# Patient Record
Sex: Male | Born: 1935 | Race: White | Hispanic: No | Smoking: Former smoker
Health system: Southern US, Community
[De-identification: ages and names within clinical notes are randomized; demographics above are authoritative.]

## PROBLEM LIST (undated history)

## (undated) DIAGNOSIS — I10 Essential (primary) hypertension: Secondary | ICD-10-CM

## (undated) DIAGNOSIS — L570 Actinic keratosis: Secondary | ICD-10-CM

## (undated) DIAGNOSIS — G2581 Restless legs syndrome: Secondary | ICD-10-CM

## (undated) DIAGNOSIS — N400 Enlarged prostate without lower urinary tract symptoms: Secondary | ICD-10-CM

## (undated) DIAGNOSIS — R011 Cardiac murmur, unspecified: Secondary | ICD-10-CM

## (undated) DIAGNOSIS — R296 Repeated falls: Secondary | ICD-10-CM

## (undated) DIAGNOSIS — I890 Lymphedema, not elsewhere classified: Secondary | ICD-10-CM

## (undated) DIAGNOSIS — M48061 Spinal stenosis, lumbar region without neurogenic claudication: Secondary | ICD-10-CM

## (undated) DIAGNOSIS — K219 Gastro-esophageal reflux disease without esophagitis: Secondary | ICD-10-CM

## (undated) DIAGNOSIS — I4821 Permanent atrial fibrillation: Secondary | ICD-10-CM

## (undated) DIAGNOSIS — K130 Diseases of lips: Secondary | ICD-10-CM

## (undated) DIAGNOSIS — I517 Cardiomegaly: Secondary | ICD-10-CM

## (undated) DIAGNOSIS — G479 Sleep disorder, unspecified: Secondary | ICD-10-CM

## (undated) DIAGNOSIS — M179 Osteoarthritis of knee, unspecified: Secondary | ICD-10-CM

## (undated) DIAGNOSIS — N184 Chronic kidney disease, stage 4 (severe): Secondary | ICD-10-CM

## (undated) DIAGNOSIS — N289 Disorder of kidney and ureter, unspecified: Secondary | ICD-10-CM

## (undated) DIAGNOSIS — R202 Paresthesia of skin: Secondary | ICD-10-CM

## (undated) DIAGNOSIS — H9190 Unspecified hearing loss, unspecified ear: Secondary | ICD-10-CM

## (undated) DIAGNOSIS — E785 Hyperlipidemia, unspecified: Secondary | ICD-10-CM

## (undated) DIAGNOSIS — N529 Male erectile dysfunction, unspecified: Secondary | ICD-10-CM

## (undated) DIAGNOSIS — M1710 Unilateral primary osteoarthritis, unspecified knee: Secondary | ICD-10-CM

## (undated) DIAGNOSIS — R200 Anesthesia of skin: Secondary | ICD-10-CM

## (undated) DIAGNOSIS — K5730 Diverticulosis of large intestine without perforation or abscess without bleeding: Secondary | ICD-10-CM

## (undated) HISTORY — DX: Cardiac murmur, unspecified: R01.1

## (undated) HISTORY — DX: Spinal stenosis, lumbar region without neurogenic claudication: M48.061

## (undated) HISTORY — DX: Actinic keratosis: L57.0

## (undated) HISTORY — DX: Male erectile dysfunction, unspecified: N52.9

## (undated) HISTORY — DX: Restless legs syndrome: G25.81

## (undated) HISTORY — DX: Diverticulosis of large intestine without perforation or abscess without bleeding: K57.30

## (undated) HISTORY — DX: Unilateral primary osteoarthritis, unspecified knee: M17.10

## (undated) HISTORY — DX: Osteoarthritis of knee, unspecified: M17.9

## (undated) HISTORY — DX: Diseases of lips: K13.0

## (undated) HISTORY — DX: Gastro-esophageal reflux disease without esophagitis: K21.9

## (undated) HISTORY — DX: Sleep disorder, unspecified: G47.9

## (undated) HISTORY — DX: Benign prostatic hyperplasia without lower urinary tract symptoms: N40.0

## (undated) HISTORY — DX: Disorder of kidney and ureter, unspecified: N28.9

## (undated) HISTORY — PX: TONSILLECTOMY: SUR1361

## (undated) HISTORY — DX: Essential (primary) hypertension: I10

## (undated) HISTORY — DX: Hyperlipidemia, unspecified: E78.5

## (undated) HISTORY — PX: FINGER SURGERY: SHX640

---

## 2003-06-29 DIAGNOSIS — K5730 Diverticulosis of large intestine without perforation or abscess without bleeding: Secondary | ICD-10-CM

## 2003-06-29 HISTORY — DX: Diverticulosis of large intestine without perforation or abscess without bleeding: K57.30

## 2004-01-23 ENCOUNTER — Encounter: Payer: Self-pay | Admitting: Family Medicine

## 2004-04-06 ENCOUNTER — Ambulatory Visit: Payer: Self-pay | Admitting: Internal Medicine

## 2004-05-10 ENCOUNTER — Ambulatory Visit: Payer: Self-pay | Admitting: Internal Medicine

## 2004-06-15 ENCOUNTER — Ambulatory Visit: Payer: Self-pay | Admitting: Internal Medicine

## 2004-07-23 ENCOUNTER — Ambulatory Visit: Payer: Self-pay | Admitting: Internal Medicine

## 2004-10-11 ENCOUNTER — Ambulatory Visit: Payer: Self-pay | Admitting: Internal Medicine

## 2004-11-03 ENCOUNTER — Ambulatory Visit: Payer: Self-pay | Admitting: Internal Medicine

## 2005-01-24 ENCOUNTER — Ambulatory Visit: Payer: Self-pay | Admitting: Internal Medicine

## 2005-03-25 ENCOUNTER — Encounter: Admission: RE | Admit: 2005-03-25 | Discharge: 2005-03-25 | Payer: Self-pay | Admitting: Orthopaedic Surgery

## 2005-04-13 ENCOUNTER — Encounter: Admission: RE | Admit: 2005-04-13 | Discharge: 2005-04-13 | Payer: Self-pay | Admitting: Orthopaedic Surgery

## 2005-04-29 ENCOUNTER — Encounter: Admission: RE | Admit: 2005-04-29 | Discharge: 2005-04-29 | Payer: Self-pay | Admitting: Orthopaedic Surgery

## 2005-05-05 ENCOUNTER — Ambulatory Visit: Payer: Self-pay | Admitting: Internal Medicine

## 2005-05-12 ENCOUNTER — Ambulatory Visit: Payer: Self-pay | Admitting: Internal Medicine

## 2005-06-15 ENCOUNTER — Ambulatory Visit: Payer: Self-pay | Admitting: Internal Medicine

## 2005-06-28 HISTORY — PX: LUMBAR SPINE SURGERY: SHX701

## 2005-08-17 ENCOUNTER — Ambulatory Visit: Payer: Self-pay | Admitting: Internal Medicine

## 2005-10-03 ENCOUNTER — Ambulatory Visit: Payer: Self-pay | Admitting: Internal Medicine

## 2005-11-02 ENCOUNTER — Ambulatory Visit: Payer: Self-pay | Admitting: Internal Medicine

## 2005-12-15 ENCOUNTER — Ambulatory Visit: Payer: Self-pay | Admitting: Internal Medicine

## 2006-01-27 ENCOUNTER — Ambulatory Visit: Payer: Self-pay | Admitting: Internal Medicine

## 2006-03-16 ENCOUNTER — Encounter: Payer: Self-pay | Admitting: Internal Medicine

## 2006-05-29 ENCOUNTER — Ambulatory Visit: Payer: Self-pay | Admitting: Internal Medicine

## 2006-05-29 LAB — CONVERTED CEMR LAB
ALT: 22 units/L (ref 0–40)
Albumin: 3.9 g/dL (ref 3.5–5.2)
BUN: 26 mg/dL — ABNORMAL HIGH (ref 6–23)
CO2: 32 meq/L (ref 19–32)
Calcium: 9.8 mg/dL (ref 8.4–10.5)
Chloride: 104 meq/L (ref 96–112)
Cholesterol: 162 mg/dL (ref 0–200)
Creatinine, Ser: 1.3 mg/dL (ref 0.4–1.5)
GFR calc Af Amer: 68 mL/min
GFR calc non Af Amer: 56 mL/min
Glucose, Bld: 101 mg/dL — ABNORMAL HIGH (ref 70–99)
HDL: 48.7 mg/dL (ref >39.0)
LDL Cholesterol: 97 mg/dL (ref 0–99)
Phosphorus: 3.3 mg/dL (ref 2.3–4.6)
Potassium: 4.4 meq/L (ref 3.5–5.1)
Sodium: 141 meq/L (ref 135–145)
Total CHOL/HDL Ratio: 3.3
Triglycerides: 84 mg/dL (ref 0–149)
VLDL: 17 mg/dL (ref 0–40)

## 2006-06-21 ENCOUNTER — Encounter: Payer: Self-pay | Admitting: Internal Medicine

## 2006-06-21 DIAGNOSIS — K5730 Diverticulosis of large intestine without perforation or abscess without bleeding: Secondary | ICD-10-CM | POA: Insufficient documentation

## 2006-06-21 DIAGNOSIS — K219 Gastro-esophageal reflux disease without esophagitis: Secondary | ICD-10-CM | POA: Insufficient documentation

## 2006-06-21 DIAGNOSIS — I10 Essential (primary) hypertension: Secondary | ICD-10-CM | POA: Insufficient documentation

## 2006-06-21 DIAGNOSIS — N401 Enlarged prostate with lower urinary tract symptoms: Secondary | ICD-10-CM | POA: Insufficient documentation

## 2006-06-21 DIAGNOSIS — E785 Hyperlipidemia, unspecified: Secondary | ICD-10-CM | POA: Insufficient documentation

## 2006-06-21 DIAGNOSIS — F528 Other sexual dysfunction not due to a substance or known physiological condition: Secondary | ICD-10-CM | POA: Insufficient documentation

## 2006-06-21 DIAGNOSIS — N138 Other obstructive and reflux uropathy: Secondary | ICD-10-CM | POA: Insufficient documentation

## 2006-06-24 DIAGNOSIS — M48061 Spinal stenosis, lumbar region without neurogenic claudication: Secondary | ICD-10-CM | POA: Insufficient documentation

## 2006-07-17 ENCOUNTER — Telehealth (INDEPENDENT_AMBULATORY_CARE_PROVIDER_SITE_OTHER): Payer: Self-pay | Admitting: *Deleted

## 2006-09-20 ENCOUNTER — Encounter (INDEPENDENT_AMBULATORY_CARE_PROVIDER_SITE_OTHER): Payer: Self-pay | Admitting: *Deleted

## 2006-10-03 ENCOUNTER — Telehealth: Payer: Self-pay | Admitting: Family Medicine

## 2006-10-23 ENCOUNTER — Telehealth (INDEPENDENT_AMBULATORY_CARE_PROVIDER_SITE_OTHER): Payer: Self-pay | Admitting: *Deleted

## 2006-12-12 ENCOUNTER — Ambulatory Visit: Payer: Self-pay | Admitting: Internal Medicine

## 2006-12-12 DIAGNOSIS — M1990 Unspecified osteoarthritis, unspecified site: Secondary | ICD-10-CM | POA: Insufficient documentation

## 2006-12-13 LAB — CONVERTED CEMR LAB
ALT: 27 units/L (ref 0–53)
AST: 35 units/L (ref 0–37)
Albumin: 4.2 g/dL (ref 3.5–5.2)
Alkaline Phosphatase: 64 units/L (ref 39–117)
BUN: 28 mg/dL — ABNORMAL HIGH (ref 6–23)
Basophils Absolute: 0 10*3/uL (ref 0.0–0.1)
Basophils Relative: 0.2 % (ref 0.0–1.0)
Bilirubin, Direct: 0.1 mg/dL (ref 0.0–0.3)
CO2: 31 meq/L (ref 19–32)
Calcium: 10 mg/dL (ref 8.4–10.5)
Chloride: 103 meq/L (ref 96–112)
Cholesterol: 172 mg/dL (ref 0–200)
Creatinine, Ser: 1.3 mg/dL (ref 0.4–1.5)
Eosinophils Absolute: 0.1 10*3/uL (ref 0.0–0.6)
Eosinophils Relative: 2 % (ref 0.0–5.0)
GFR calc Af Amer: 68 mL/min
GFR calc non Af Amer: 56 mL/min
Glucose, Bld: 95 mg/dL (ref 70–99)
HCT: 43 % (ref 39.0–52.0)
HDL: 36.5 mg/dL — ABNORMAL LOW (ref >39.0)
Hemoglobin: 14.6 g/dL (ref 13.0–17.0)
LDL Cholesterol: 113 mg/dL — ABNORMAL HIGH (ref 0–99)
Lymphocytes Relative: 25.8 % (ref 12.0–46.0)
MCHC: 33.9 g/dL (ref 30.0–36.0)
MCV: 93 fL (ref 78.0–100.0)
Monocytes Absolute: 0.4 10*3/uL (ref 0.2–0.7)
Monocytes Relative: 6.9 % (ref 3.0–11.0)
Neutro Abs: 4 10*3/uL (ref 1.4–7.7)
Neutrophils Relative %: 65.1 % (ref 43.0–77.0)
Phosphorus: 3.1 mg/dL (ref 2.3–4.6)
Platelets: 172 10*3/uL (ref 150–400)
Potassium: 5.3 meq/L — ABNORMAL HIGH (ref 3.5–5.1)
RBC: 4.62 M/uL (ref 4.22–5.81)
RDW: 12.3 % (ref 11.5–14.6)
Sodium: 140 meq/L (ref 135–145)
TSH: 1.69 microintl units/mL (ref 0.35–5.50)
Total Bilirubin: 1.2 mg/dL (ref 0.3–1.2)
Total CHOL/HDL Ratio: 4.7
Total Protein: 6.6 g/dL (ref 6.0–8.3)
Triglycerides: 115 mg/dL (ref 0–149)
VLDL: 23 mg/dL (ref 0–40)
WBC: 6 10*3/uL (ref 4.5–10.5)

## 2006-12-25 ENCOUNTER — Telehealth (INDEPENDENT_AMBULATORY_CARE_PROVIDER_SITE_OTHER): Payer: Self-pay | Admitting: *Deleted

## 2006-12-26 ENCOUNTER — Ambulatory Visit: Payer: Self-pay | Admitting: Internal Medicine

## 2006-12-26 LAB — CONVERTED CEMR LAB: Potassium: 4.1 meq/L (ref 3.5–5.1)

## 2007-03-05 ENCOUNTER — Telehealth (INDEPENDENT_AMBULATORY_CARE_PROVIDER_SITE_OTHER): Payer: Self-pay | Admitting: *Deleted

## 2007-03-21 ENCOUNTER — Telehealth (INDEPENDENT_AMBULATORY_CARE_PROVIDER_SITE_OTHER): Payer: Self-pay | Admitting: *Deleted

## 2007-04-17 ENCOUNTER — Ambulatory Visit: Payer: Self-pay | Admitting: Family Medicine

## 2007-04-23 ENCOUNTER — Telehealth (INDEPENDENT_AMBULATORY_CARE_PROVIDER_SITE_OTHER): Payer: Self-pay | Admitting: *Deleted

## 2007-05-24 ENCOUNTER — Telehealth (INDEPENDENT_AMBULATORY_CARE_PROVIDER_SITE_OTHER): Payer: Self-pay | Admitting: *Deleted

## 2007-05-25 ENCOUNTER — Ambulatory Visit: Payer: Self-pay | Admitting: Internal Medicine

## 2007-07-09 ENCOUNTER — Telehealth (INDEPENDENT_AMBULATORY_CARE_PROVIDER_SITE_OTHER): Payer: Self-pay | Admitting: *Deleted

## 2007-07-24 ENCOUNTER — Ambulatory Visit: Payer: Self-pay | Admitting: Internal Medicine

## 2007-07-25 LAB — CONVERTED CEMR LAB
ALT: 33 units/L (ref 0–53)
AST: 32 units/L (ref 0–37)
Albumin: 4.1 g/dL (ref 3.5–5.2)
Alkaline Phosphatase: 112 units/L (ref 39–117)
BUN: 33 mg/dL — ABNORMAL HIGH (ref 6–23)
Basophils Absolute: 0 10*3/uL (ref 0.0–0.1)
Basophils Relative: 0.2 % (ref 0.0–1.0)
Bilirubin, Direct: 0.2 mg/dL (ref 0.0–0.3)
CO2: 32 meq/L (ref 19–32)
Calcium: 9.6 mg/dL (ref 8.4–10.5)
Chloride: 106 meq/L (ref 96–112)
Cholesterol: 147 mg/dL (ref 0–200)
Creatinine, Ser: 1.4 mg/dL (ref 0.4–1.5)
Eosinophils Absolute: 0.1 10*3/uL (ref 0.0–0.7)
Eosinophils Relative: 1.3 % (ref 0.0–5.0)
GFR calc Af Amer: 63 mL/min
GFR calc non Af Amer: 52 mL/min
Glucose, Bld: 92 mg/dL (ref 70–99)
HCT: 43.2 % (ref 39.0–52.0)
HDL: 29.1 mg/dL — ABNORMAL LOW (ref >39.0)
Hemoglobin: 14.4 g/dL (ref 13.0–17.0)
LDL Cholesterol: 101 mg/dL — ABNORMAL HIGH (ref 0–99)
Lymphocytes Relative: 19.5 % (ref 12.0–46.0)
MCHC: 33.2 g/dL (ref 30.0–36.0)
MCV: 96.1 fL (ref 78.0–100.0)
Monocytes Absolute: 0.3 10*3/uL (ref 0.1–1.0)
Monocytes Relative: 5.3 % (ref 3.0–12.0)
Neutro Abs: 4.2 10*3/uL (ref 1.4–7.7)
Neutrophils Relative %: 73.7 % (ref 43.0–77.0)
Phosphorus: 3.3 mg/dL (ref 2.3–4.6)
Platelets: 158 10*3/uL (ref 150–400)
Potassium: 4.3 meq/L (ref 3.5–5.1)
RBC: 4.5 M/uL (ref 4.22–5.81)
RDW: 12.2 % (ref 11.5–14.6)
Sodium: 142 meq/L (ref 135–145)
TSH: 1.31 microintl units/mL (ref 0.35–5.50)
Total Bilirubin: 1.3 mg/dL — ABNORMAL HIGH (ref 0.3–1.2)
Total CHOL/HDL Ratio: 5.1
Total Protein: 6.5 g/dL (ref 6.0–8.3)
Triglycerides: 87 mg/dL (ref 0–149)
VLDL: 17 mg/dL (ref 0–40)
WBC: 5.7 10*3/uL (ref 4.5–10.5)

## 2007-07-30 ENCOUNTER — Telehealth: Payer: Self-pay | Admitting: Internal Medicine

## 2007-08-02 ENCOUNTER — Encounter: Payer: Self-pay | Admitting: Internal Medicine

## 2007-08-27 ENCOUNTER — Telehealth: Payer: Self-pay | Admitting: Internal Medicine

## 2007-08-30 ENCOUNTER — Telehealth: Payer: Self-pay | Admitting: Family Medicine

## 2007-09-03 ENCOUNTER — Telehealth (INDEPENDENT_AMBULATORY_CARE_PROVIDER_SITE_OTHER): Payer: Self-pay | Admitting: *Deleted

## 2007-10-01 ENCOUNTER — Ambulatory Visit: Payer: Self-pay | Admitting: Family Medicine

## 2007-11-28 ENCOUNTER — Ambulatory Visit: Payer: Self-pay | Admitting: Internal Medicine

## 2007-11-28 DIAGNOSIS — R609 Edema, unspecified: Secondary | ICD-10-CM | POA: Insufficient documentation

## 2007-11-29 LAB — CONVERTED CEMR LAB
ALT: 41 units/L (ref 0–53)
AST: 35 units/L (ref 0–37)
Albumin: 4.1 g/dL (ref 3.5–5.2)
Alkaline Phosphatase: 105 units/L (ref 39–117)
BUN: 32 mg/dL — ABNORMAL HIGH (ref 6–23)
Basophils Absolute: 0 10*3/uL (ref 0.0–0.1)
Basophils Relative: 0.1 % (ref 0.0–3.0)
Bilirubin, Direct: 0.2 mg/dL (ref 0.0–0.3)
CO2: 31 meq/L (ref 19–32)
Calcium: 9.7 mg/dL (ref 8.4–10.5)
Chloride: 100 meq/L (ref 96–112)
Creatinine, Ser: 1.4 mg/dL (ref 0.4–1.5)
Eosinophils Absolute: 0.1 10*3/uL (ref 0.0–0.7)
Eosinophils Relative: 1.5 % (ref 0.0–5.0)
GFR calc Af Amer: 62 mL/min
GFR calc non Af Amer: 52 mL/min
Glucose, Bld: 96 mg/dL (ref 70–99)
HCT: 40.3 % (ref 39.0–52.0)
Hemoglobin: 13.9 g/dL (ref 13.0–17.0)
Lymphocytes Relative: 20.9 % (ref 12.0–46.0)
MCHC: 34.5 g/dL (ref 30.0–36.0)
MCV: 95.7 fL (ref 78.0–100.0)
Monocytes Absolute: 0.3 10*3/uL (ref 0.1–1.0)
Monocytes Relative: 5.6 % (ref 3.0–12.0)
Neutro Abs: 4.3 10*3/uL (ref 1.4–7.7)
Neutrophils Relative %: 71.9 % (ref 43.0–77.0)
Phosphorus: 2.7 mg/dL (ref 2.3–4.6)
Platelets: 138 10*3/uL — ABNORMAL LOW (ref 150–400)
Potassium: 4.2 meq/L (ref 3.5–5.1)
RBC: 4.22 M/uL (ref 4.22–5.81)
RDW: 12.7 % (ref 11.5–14.6)
Sodium: 140 meq/L (ref 135–145)
Total Bilirubin: 1.1 mg/dL (ref 0.3–1.2)
Total Protein: 6.6 g/dL (ref 6.0–8.3)
WBC: 6 10*3/uL (ref 4.5–10.5)

## 2007-12-06 ENCOUNTER — Telehealth: Payer: Self-pay | Admitting: Internal Medicine

## 2007-12-10 ENCOUNTER — Telehealth: Payer: Self-pay | Admitting: Family Medicine

## 2008-01-22 ENCOUNTER — Ambulatory Visit: Payer: Self-pay | Admitting: Family Medicine

## 2008-01-31 ENCOUNTER — Encounter: Payer: Self-pay | Admitting: Family Medicine

## 2008-01-31 ENCOUNTER — Ambulatory Visit: Payer: Self-pay

## 2008-02-04 ENCOUNTER — Ambulatory Visit: Payer: Self-pay | Admitting: Family Medicine

## 2008-02-11 ENCOUNTER — Telehealth: Payer: Self-pay | Admitting: Internal Medicine

## 2008-03-20 ENCOUNTER — Telehealth: Payer: Self-pay | Admitting: Internal Medicine

## 2008-04-22 ENCOUNTER — Telehealth: Payer: Self-pay | Admitting: Internal Medicine

## 2008-04-25 ENCOUNTER — Telehealth: Payer: Self-pay | Admitting: Internal Medicine

## 2008-05-02 ENCOUNTER — Telehealth (INDEPENDENT_AMBULATORY_CARE_PROVIDER_SITE_OTHER): Payer: Self-pay | Admitting: *Deleted

## 2008-05-12 ENCOUNTER — Encounter: Payer: Self-pay | Admitting: Internal Medicine

## 2008-05-12 ENCOUNTER — Ambulatory Visit: Payer: Self-pay | Admitting: General Practice

## 2008-05-13 ENCOUNTER — Telehealth: Payer: Self-pay | Admitting: Internal Medicine

## 2008-05-14 ENCOUNTER — Ambulatory Visit: Payer: Self-pay | Admitting: Internal Medicine

## 2008-05-20 ENCOUNTER — Ambulatory Visit: Payer: Self-pay | Admitting: Internal Medicine

## 2008-05-21 LAB — CONVERTED CEMR LAB
Creatinine, Ser: 1.4 mg/dL (ref 0.4–1.5)
Glucose, Bld: 97 mg/dL (ref 70–99)
Phosphorus: 3.4 mg/dL (ref 2.3–4.6)
Potassium: 4.3 meq/L (ref 3.5–5.1)
Sodium: 142 meq/L (ref 135–145)

## 2008-05-26 ENCOUNTER — Telehealth: Payer: Self-pay | Admitting: Internal Medicine

## 2008-06-09 ENCOUNTER — Telehealth: Payer: Self-pay | Admitting: Internal Medicine

## 2008-06-23 ENCOUNTER — Ambulatory Visit: Payer: Self-pay | Admitting: Family Medicine

## 2008-06-28 HISTORY — PX: TOTAL KNEE ARTHROPLASTY: SHX125

## 2008-07-09 ENCOUNTER — Ambulatory Visit: Payer: Self-pay | Admitting: General Practice

## 2008-07-22 ENCOUNTER — Ambulatory Visit: Payer: Self-pay | Admitting: Cardiovascular Disease

## 2008-07-22 ENCOUNTER — Inpatient Hospital Stay: Payer: Self-pay | Admitting: General Practice

## 2008-07-23 ENCOUNTER — Encounter: Payer: Self-pay | Admitting: Internal Medicine

## 2008-07-25 ENCOUNTER — Encounter: Payer: Self-pay | Admitting: Internal Medicine

## 2008-07-29 ENCOUNTER — Encounter: Payer: Self-pay | Admitting: Internal Medicine

## 2008-07-29 ENCOUNTER — Encounter: Payer: Self-pay | Admitting: Cardiovascular Disease

## 2008-08-01 ENCOUNTER — Encounter: Payer: Self-pay | Admitting: Internal Medicine

## 2008-08-02 ENCOUNTER — Encounter: Payer: Self-pay | Admitting: Internal Medicine

## 2008-08-20 ENCOUNTER — Telehealth: Payer: Self-pay | Admitting: Internal Medicine

## 2008-08-22 ENCOUNTER — Telehealth: Payer: Self-pay | Admitting: Internal Medicine

## 2008-08-28 ENCOUNTER — Encounter: Payer: Self-pay | Admitting: Internal Medicine

## 2008-08-29 ENCOUNTER — Telehealth: Payer: Self-pay | Admitting: Family Medicine

## 2008-09-03 ENCOUNTER — Ambulatory Visit: Payer: Self-pay | Admitting: Family Medicine

## 2008-09-03 LAB — CONVERTED CEMR LAB
Bilirubin Urine: NEGATIVE
Glucose, Urine, Semiquant: NEGATIVE
Ketones, urine, test strip: NEGATIVE
Nitrite: POSITIVE
Protein, U semiquant: >=300
Specific Gravity, Urine: 1.015
Urobilinogen, UA: 0.2
pH: 6.5

## 2008-09-04 ENCOUNTER — Encounter: Payer: Self-pay | Admitting: Family Medicine

## 2008-09-04 LAB — CONVERTED CEMR LAB
ALT: 20 units/L (ref 0–53)
AST: 22 units/L (ref 0–37)
Albumin: 3.3 g/dL — ABNORMAL LOW (ref 3.5–5.2)
Alkaline Phosphatase: 74 units/L (ref 39–117)
BUN: 28 mg/dL — ABNORMAL HIGH (ref 6–23)
Bilirubin, Direct: 0.1 mg/dL (ref 0.0–0.3)
CO2: 33 meq/L — ABNORMAL HIGH (ref 19–32)
Calcium: 9.4 mg/dL (ref 8.4–10.5)
Chloride: 104 meq/L (ref 96–112)
Creatinine, Ser: 1.2 mg/dL (ref 0.4–1.5)
GFR calc non Af Amer: 61.45 mL/min (ref >60)
Glucose, Bld: 107 mg/dL — ABNORMAL HIGH (ref 70–99)
Potassium: 3.8 meq/L (ref 3.5–5.1)
Sodium: 142 meq/L (ref 135–145)
Total Bilirubin: 0.9 mg/dL (ref 0.3–1.2)
Total Protein: 5.8 g/dL — ABNORMAL LOW (ref 6.0–8.3)

## 2008-09-11 ENCOUNTER — Telehealth: Payer: Self-pay | Admitting: Family Medicine

## 2008-09-12 ENCOUNTER — Encounter: Payer: Self-pay | Admitting: Family Medicine

## 2008-09-17 ENCOUNTER — Ambulatory Visit: Payer: Self-pay | Admitting: Internal Medicine

## 2008-09-17 LAB — CONVERTED CEMR LAB
Albumin: 3.5 g/dL (ref 3.5–5.2)
BUN: 30 mg/dL — ABNORMAL HIGH (ref 6–23)
Basophils Absolute: 0 10*3/uL (ref 0.0–0.1)
Basophils Relative: 0.2 % (ref 0.0–3.0)
CO2: 34 meq/L — ABNORMAL HIGH (ref 19–32)
Calcium: 9.5 mg/dL (ref 8.4–10.5)
Chloride: 102 meq/L (ref 96–112)
Creatinine, Ser: 1.3 mg/dL (ref 0.4–1.5)
Eosinophils Absolute: 0.1 10*3/uL (ref 0.0–0.7)
Eosinophils Relative: 2.4 % (ref 0.0–5.0)
Glucose, Bld: 105 mg/dL — ABNORMAL HIGH (ref 70–99)
HCT: 34 % — ABNORMAL LOW (ref 39.0–52.0)
Hemoglobin: 11.4 g/dL — ABNORMAL LOW (ref 13.0–17.0)
Lymphocytes Relative: 27.3 % (ref 12.0–46.0)
Lymphs Abs: 1.6 10*3/uL (ref 0.7–4.0)
MCHC: 33.4 g/dL (ref 30.0–36.0)
MCV: 89.2 fL (ref 78.0–100.0)
Monocytes Absolute: 0.4 10*3/uL (ref 0.1–1.0)
Monocytes Relative: 7 % (ref 3.0–12.0)
Neutro Abs: 3.7 10*3/uL (ref 1.4–7.7)
Neutrophils Relative %: 63.1 % (ref 43.0–77.0)
Phosphorus: 3 mg/dL (ref 2.3–4.6)
Platelets: 206 10*3/uL (ref 150.0–400.0)
Potassium: 4.3 meq/L (ref 3.5–5.1)
RBC: 3.81 M/uL — ABNORMAL LOW (ref 4.22–5.81)
RDW: 14.6 % (ref 11.5–14.6)
Sodium: 142 meq/L (ref 135–145)
WBC: 5.8 10*3/uL (ref 4.5–10.5)

## 2008-10-01 ENCOUNTER — Encounter: Payer: Self-pay | Admitting: Internal Medicine

## 2008-10-03 ENCOUNTER — Ambulatory Visit: Payer: Self-pay | Admitting: Internal Medicine

## 2008-10-03 DIAGNOSIS — G2581 Restless legs syndrome: Secondary | ICD-10-CM | POA: Insufficient documentation

## 2008-10-06 ENCOUNTER — Telehealth: Payer: Self-pay | Admitting: Internal Medicine

## 2008-10-27 ENCOUNTER — Telehealth: Payer: Self-pay | Admitting: Internal Medicine

## 2009-01-16 ENCOUNTER — Ambulatory Visit: Payer: Self-pay | Admitting: Internal Medicine

## 2009-01-19 LAB — CONVERTED CEMR LAB
ALT: 24 units/L (ref 0–53)
AST: 27 units/L (ref 0–37)
Albumin: 4.3 g/dL (ref 3.5–5.2)
Alkaline Phosphatase: 98 units/L (ref 39–117)
BUN: 48 mg/dL — ABNORMAL HIGH (ref 6–23)
Basophils Absolute: 0 10*3/uL (ref 0.0–0.1)
Basophils Relative: 0.3 % (ref 0.0–3.0)
Bilirubin, Direct: 0.2 mg/dL (ref 0.0–0.3)
CO2: 31 meq/L (ref 19–32)
Calcium: 9.6 mg/dL (ref 8.4–10.5)
Chloride: 104 meq/L (ref 96–112)
Cholesterol: 166 mg/dL (ref 0–200)
Creatinine, Ser: 2 mg/dL — ABNORMAL HIGH (ref 0.4–1.5)
Eosinophils Absolute: 0.1 10*3/uL (ref 0.0–0.7)
Eosinophils Relative: 2.2 % (ref 0.0–5.0)
GFR calc non Af Amer: 34.05 mL/min (ref >60)
Glucose, Bld: 108 mg/dL — ABNORMAL HIGH (ref 70–99)
HCT: 38.4 % — ABNORMAL LOW (ref 39.0–52.0)
HDL: 44.5 mg/dL (ref >39.00)
Hemoglobin: 13 g/dL (ref 13.0–17.0)
LDL Cholesterol: 111 mg/dL — ABNORMAL HIGH (ref 0–99)
Lymphocytes Relative: 24 % (ref 12.0–46.0)
Lymphs Abs: 1.5 10*3/uL (ref 0.7–4.0)
MCHC: 33.8 g/dL (ref 30.0–36.0)
MCV: 95.8 fL (ref 78.0–100.0)
Monocytes Absolute: 0.4 10*3/uL (ref 0.1–1.0)
Monocytes Relative: 7 % (ref 3.0–12.0)
Neutro Abs: 4.4 10*3/uL (ref 1.4–7.7)
Neutrophils Relative %: 66.5 % (ref 43.0–77.0)
Phosphorus: 3.9 mg/dL (ref 2.3–4.6)
Platelets: 152 10*3/uL (ref 150.0–400.0)
Potassium: 4.1 meq/L (ref 3.5–5.1)
RBC: 4.01 M/uL — ABNORMAL LOW (ref 4.22–5.81)
RDW: 13.3 % (ref 11.5–14.6)
Sodium: 144 meq/L (ref 135–145)
TSH: 2.22 microintl units/mL (ref 0.35–5.50)
Total Bilirubin: 1.1 mg/dL (ref 0.3–1.2)
Total CHOL/HDL Ratio: 4
Total Protein: 6.7 g/dL (ref 6.0–8.3)
Triglycerides: 55 mg/dL (ref 0.0–149.0)
VLDL: 11 mg/dL (ref 0.0–40.0)
WBC: 6.4 10*3/uL (ref 4.5–10.5)

## 2009-01-26 ENCOUNTER — Ambulatory Visit: Payer: Self-pay | Admitting: Internal Medicine

## 2009-01-26 DIAGNOSIS — N1832 Chronic kidney disease, stage 3b: Secondary | ICD-10-CM | POA: Insufficient documentation

## 2009-01-26 DIAGNOSIS — N184 Chronic kidney disease, stage 4 (severe): Secondary | ICD-10-CM | POA: Insufficient documentation

## 2009-01-26 DIAGNOSIS — N183 Chronic kidney disease, stage 3 (moderate): Secondary | ICD-10-CM

## 2009-01-30 ENCOUNTER — Ambulatory Visit: Payer: Self-pay | Admitting: Internal Medicine

## 2009-01-30 LAB — CONVERTED CEMR LAB
Albumin: 4.3 g/dL (ref 3.5–5.2)
BUN: 53 mg/dL — ABNORMAL HIGH (ref 6–23)
CO2: 32 meq/L (ref 19–32)
Calcium: 9.8 mg/dL (ref 8.4–10.5)
Chloride: 104 meq/L (ref 96–112)
Creatinine, Ser: 2 mg/dL — ABNORMAL HIGH (ref 0.4–1.5)
GFR calc non Af Amer: 34.05 mL/min (ref >60)
Glucose, Bld: 98 mg/dL (ref 70–99)
Phosphorus: 3.6 mg/dL (ref 2.3–4.6)
Potassium: 3.9 meq/L (ref 3.5–5.1)
Sodium: 143 meq/L (ref 135–145)

## 2009-02-09 ENCOUNTER — Telehealth: Payer: Self-pay | Admitting: Internal Medicine

## 2009-02-13 ENCOUNTER — Ambulatory Visit: Payer: Self-pay | Admitting: Internal Medicine

## 2009-02-16 LAB — CONVERTED CEMR LAB
Albumin: 4.1 g/dL (ref 3.5–5.2)
BUN: 37 mg/dL — ABNORMAL HIGH (ref 6–23)
CO2: 32 meq/L (ref 19–32)
Calcium: 9.6 mg/dL (ref 8.4–10.5)
Chloride: 100 meq/L (ref 96–112)
Creatinine, Ser: 1.7 mg/dL — ABNORMAL HIGH (ref 0.4–1.5)
GFR calc non Af Amer: 41.07 mL/min (ref >60)
Glucose, Bld: 87 mg/dL (ref 70–99)
Phosphorus: 3.4 mg/dL (ref 2.3–4.6)
Potassium: 3.9 meq/L (ref 3.5–5.1)
Sodium: 139 meq/L (ref 135–145)

## 2009-02-28 HISTORY — PX: CATARACT EXTRACTION, BILATERAL: SHX1313

## 2009-03-17 ENCOUNTER — Ambulatory Visit: Payer: Self-pay | Admitting: Internal Medicine

## 2009-03-19 ENCOUNTER — Ambulatory Visit: Payer: Self-pay | Admitting: Ophthalmology

## 2009-03-19 ENCOUNTER — Encounter: Payer: Self-pay | Admitting: Internal Medicine

## 2009-03-20 ENCOUNTER — Telehealth: Payer: Self-pay | Admitting: Internal Medicine

## 2009-03-23 ENCOUNTER — Ambulatory Visit: Payer: Self-pay | Admitting: Ophthalmology

## 2009-03-24 ENCOUNTER — Telehealth: Payer: Self-pay | Admitting: Internal Medicine

## 2009-04-21 ENCOUNTER — Encounter: Payer: Self-pay | Admitting: Internal Medicine

## 2009-04-21 ENCOUNTER — Ambulatory Visit: Payer: Self-pay | Admitting: Ophthalmology

## 2009-04-22 ENCOUNTER — Telehealth: Payer: Self-pay | Admitting: Internal Medicine

## 2009-04-22 ENCOUNTER — Encounter: Payer: Self-pay | Admitting: Internal Medicine

## 2009-04-24 ENCOUNTER — Ambulatory Visit: Payer: Self-pay | Admitting: Internal Medicine

## 2009-04-24 LAB — CONVERTED CEMR LAB: Potassium: 3.5 meq/L (ref 3.5–5.1)

## 2009-04-27 ENCOUNTER — Ambulatory Visit: Payer: Self-pay | Admitting: Ophthalmology

## 2009-05-19 ENCOUNTER — Ambulatory Visit: Payer: Self-pay | Admitting: Internal Medicine

## 2009-05-20 LAB — CONVERTED CEMR LAB
Albumin: 4 g/dL (ref 3.5–5.2)
BUN: 37 mg/dL — ABNORMAL HIGH (ref 6–23)
Basophils Absolute: 0 10*3/uL (ref 0.0–0.1)
Basophils Relative: 0.2 % (ref 0.0–3.0)
CO2: 34 meq/L — ABNORMAL HIGH (ref 19–32)
Calcium: 9.6 mg/dL (ref 8.4–10.5)
Chloride: 104 meq/L (ref 96–112)
Creatinine, Ser: 1.7 mg/dL — ABNORMAL HIGH (ref 0.4–1.5)
Eosinophils Absolute: 0.1 10*3/uL (ref 0.0–0.7)
Eosinophils Relative: 1.9 % (ref 0.0–5.0)
GFR calc non Af Amer: 41.04 mL/min (ref >60)
Glucose, Bld: 107 mg/dL — ABNORMAL HIGH (ref 70–99)
HCT: 41.3 % (ref 39.0–52.0)
Hemoglobin: 13.7 g/dL (ref 13.0–17.0)
Lymphocytes Relative: 27.3 % (ref 12.0–46.0)
Lymphs Abs: 1.7 10*3/uL (ref 0.7–4.0)
MCHC: 33.1 g/dL (ref 30.0–36.0)
MCV: 93.8 fL (ref 78.0–100.0)
Monocytes Absolute: 0.4 10*3/uL (ref 0.1–1.0)
Monocytes Relative: 7.1 % (ref 3.0–12.0)
Neutro Abs: 3.9 10*3/uL (ref 1.4–7.7)
Neutrophils Relative %: 63.5 % (ref 43.0–77.0)
Phosphorus: 2.9 mg/dL (ref 2.3–4.6)
Platelets: 156 10*3/uL (ref 150.0–400.0)
Potassium: 3.7 meq/L (ref 3.5–5.1)
RBC: 4.4 M/uL (ref 4.22–5.81)
RDW: 13 % (ref 11.5–14.6)
Sodium: 143 meq/L (ref 135–145)
TSH: 2.34 microintl units/mL (ref 0.35–5.50)
WBC: 6.1 10*3/uL (ref 4.5–10.5)

## 2009-07-07 ENCOUNTER — Ambulatory Visit: Payer: Self-pay | Admitting: Internal Medicine

## 2009-07-10 ENCOUNTER — Telehealth (INDEPENDENT_AMBULATORY_CARE_PROVIDER_SITE_OTHER): Payer: Self-pay | Admitting: *Deleted

## 2009-07-13 ENCOUNTER — Ambulatory Visit: Payer: Self-pay | Admitting: Internal Medicine

## 2009-07-14 ENCOUNTER — Telehealth: Payer: Self-pay | Admitting: Internal Medicine

## 2009-07-14 LAB — CONVERTED CEMR LAB

## 2009-07-21 ENCOUNTER — Telehealth: Payer: Self-pay | Admitting: Internal Medicine

## 2009-08-03 ENCOUNTER — Telehealth: Payer: Self-pay | Admitting: Internal Medicine

## 2009-08-12 ENCOUNTER — Ambulatory Visit: Payer: Self-pay | Admitting: Internal Medicine

## 2009-08-13 LAB — CONVERTED CEMR LAB
Albumin: 4.4 g/dL (ref 3.5–5.2)
BUN: 33 mg/dL — ABNORMAL HIGH (ref 6–23)
CO2: 31 meq/L (ref 19–32)
Calcium: 9.7 mg/dL (ref 8.4–10.5)
Chloride: 103 meq/L (ref 96–112)
Creatinine, Ser: 1.4 mg/dL (ref 0.4–1.5)
GFR calc non Af Amer: 50.08 mL/min (ref >60)
Glucose, Bld: 103 mg/dL — ABNORMAL HIGH (ref 70–99)
Phosphorus: 3.3 mg/dL (ref 2.3–4.6)
Potassium: 4.7 meq/L (ref 3.5–5.1)
Sodium: 140 meq/L (ref 135–145)

## 2009-08-17 ENCOUNTER — Telehealth (INDEPENDENT_AMBULATORY_CARE_PROVIDER_SITE_OTHER): Payer: Self-pay | Admitting: *Deleted

## 2009-09-07 ENCOUNTER — Telehealth: Payer: Self-pay | Admitting: Internal Medicine

## 2009-09-08 ENCOUNTER — Telehealth: Payer: Self-pay | Admitting: Internal Medicine

## 2009-09-21 ENCOUNTER — Ambulatory Visit: Payer: Self-pay | Admitting: Family Medicine

## 2009-09-23 ENCOUNTER — Telehealth: Payer: Self-pay | Admitting: Family Medicine

## 2009-10-02 ENCOUNTER — Encounter: Payer: Self-pay | Admitting: Family Medicine

## 2009-10-12 ENCOUNTER — Telehealth: Payer: Self-pay | Admitting: Internal Medicine

## 2009-10-29 HISTORY — PX: OTHER SURGICAL HISTORY: SHX169

## 2009-11-10 ENCOUNTER — Ambulatory Visit: Payer: Self-pay | Admitting: General Surgery

## 2009-11-10 ENCOUNTER — Encounter: Payer: Self-pay | Admitting: Internal Medicine

## 2009-11-12 ENCOUNTER — Encounter: Payer: Self-pay | Admitting: Internal Medicine

## 2009-11-18 ENCOUNTER — Encounter: Payer: Self-pay | Admitting: Internal Medicine

## 2009-11-18 ENCOUNTER — Ambulatory Visit: Payer: Self-pay | Admitting: Internal Medicine

## 2009-11-18 DIAGNOSIS — L570 Actinic keratosis: Secondary | ICD-10-CM | POA: Insufficient documentation

## 2009-11-20 ENCOUNTER — Encounter: Payer: Self-pay | Admitting: Internal Medicine

## 2009-11-20 ENCOUNTER — Ambulatory Visit: Payer: Self-pay | Admitting: General Surgery

## 2009-11-27 ENCOUNTER — Encounter: Payer: Self-pay | Admitting: Internal Medicine

## 2009-11-30 ENCOUNTER — Ambulatory Visit: Payer: Self-pay | Admitting: Internal Medicine

## 2009-11-30 DIAGNOSIS — K130 Diseases of lips: Secondary | ICD-10-CM | POA: Insufficient documentation

## 2009-12-28 ENCOUNTER — Encounter: Payer: Self-pay | Admitting: Internal Medicine

## 2010-01-04 ENCOUNTER — Telehealth: Payer: Self-pay | Admitting: Internal Medicine

## 2010-02-01 ENCOUNTER — Telehealth: Payer: Self-pay | Admitting: Internal Medicine

## 2010-02-02 ENCOUNTER — Telehealth: Payer: Self-pay | Admitting: Internal Medicine

## 2010-02-23 ENCOUNTER — Telehealth: Payer: Self-pay | Admitting: Internal Medicine

## 2010-03-05 ENCOUNTER — Telehealth: Payer: Self-pay | Admitting: Internal Medicine

## 2010-03-30 NOTE — Progress Notes (Signed)
Summary: requests something for sleep  Phone Note Call from Patient   Caller: Patient Call For: dr Silvio Pate Summary of Call: Pt is requesting something for sleep, would like to try ambien, [facility] to [facility] [address] Initial call taken by: [name],  December 06, 2007 11:28 AM  Follow-up for Phone Call        okay to try zolpidem  5mg  1 at bedtime as needed insomnia #30 x 0 Follow-up by: Claris Gower MD,  December 06, 2007 1:35 PM  Additional Follow-up for Phone Call Additional follow up Details #1::        [facility] [address] advised pt. Additional Follow-up by: [name],  December 06, 2007 1:55 PM

## 2010-03-30 NOTE — Progress Notes (Signed)
Summary: Mirapex Rx. for [facility] (pick up)  Phone Note Call from Patient Call back at Home Phone [phone]   Caller: Patient Call For: Dr. Silvio Pate Summary of Call: Patient requests a written Rx. to go to [facility] for Mirapex 1.0 mg tablet.  Take 1-2 tablets by mouth nightly.  I did not put this in as a refill request because this is not the strength that is listed on his meds list and the directions are not in properly so I was unsure about your approval.  Patient requests that we call when this is complete and ready for pick up. Initial call taken by: Christena Deem,  April 23, 2007 9:13 AM  Follow-up for Phone Call        I think that is right Rx done Follow-up by: Claris Gower MD,  April 23, 2007 1:31 PM  Additional Follow-up for Phone Call Additional follow up Details #1::        called to pick up rx Additional Follow-up by: Lelon Mast,  April 23, 2007 2:07 PM    New/Updated Medications: MIRAPEX 1 MG  TABS (PRAMIPEXOLE DIHYDROCHLORIDE) 1-2 at bedtime for restless legs   Prescriptions: MIRAPEX 1 MG  TABS (PRAMIPEXOLE DIHYDROCHLORIDE) 1-2 at bedtime for restless legs  #180 x 3   Entered and Authorized by:   Claris Gower MD   Signed by:   Claris Gower MD on 04/23/2007   Method used:   Print then Give to Patient   RxID:   [id number]

## 2010-03-30 NOTE — Assessment & Plan Note (Signed)
Summary: CHECK GROWTH ON HAND/CLE   Vital Signs:  Patient Profile:   77 Years Old Male Height:     67.4 inches (171.2 cm) Weight:      175 pounds Temp:     96.5 degrees F oral Pulse rate:   64 / minute Pulse rhythm:   regular BP sitting:   144 / 74  (left arm) Cuff size:   regular  Vitals Entered ByMarland Kitchen Gwinda Maine (May 25, 2007 3:47 PM)                 Chief Complaint:  check growth on hand.  History of Present Illness: Has abnormal area on left hand for 3-4 weeks Has tried to pull it off without success No pain unless he picks at it No bleeding  doing okay otherwise No heart problems Remains fit Doesn't check blood pressure  Still playing tennis voltaren helps his ailing knees  Voiding okay up twice at night to void No daytime urgency. Does have some frequency-not a  problem     Prior Medications Reviewed Using: List Brought by Patient  Current Allergies (reviewed today): ! * STATINS LIPITOR PRAVACHOL  Past Medical History:    Reviewed history from 12/12/2006 and no changes required:       Diverticulosis, colon05/2005       GERD       Hyperlipidemia       Hypertension       Benign prostatic hypertrophy       Osteoarthritis  Past Surgical History:    Reviewed history from 06/21/2006 and no changes required:       R 4th finger surgery       Tonsils as child       Lumbar spinal stenosis repair  06/2005   Social History:    Reviewed history from 06/21/2006 and no changes required:       Retired--school principal       Married--2 adopted children       Never Smoked       Alcohol use-no       Wife has Stage 4 bronchoalveolar cancer    Review of Systems  The patient denies chest pain, syncope, and dyspnea on exhertion.         sleeping well Wife is doing well--3 years out from lung cancer diagnosis   Physical Exam  General:     alert and normal appearance.   Neck:     supple, no masses, no thyromegaly, no carotid bruits, and no  cervical lymphadenopathy.   Lungs:     normal respiratory effort and normal breath sounds.   Heart:     normal rate, regular rhythm, no murmur, and no gallop.   Abdomen:     soft and non-tender.   Msk:      no active synovitis Pulses:     faint in feet Extremities:     no edema or calf tenderness Skin:     actinic in dorsum of left hand Psych:     normally interactive, good eye contact, not anxious appearing, and not depressed appearing.      Impression & Recommendations:  Problem # 1:  HYPERTENSION (ICD-401.9) Assessment: Unchanged good control blood work next time  His updated medication list for this problem includes:    Cardura 8 Mg Tabs (Doxazosin mesylate) .Marland Kitchen... 1 daily    Hydrochlorothiazide 25 Mg Tabs (Hydrochlorothiazide) .Marland Kitchen... 1 daily    Lisinopril 20 Mg Tabs (Lisinopril) .Marland Kitchen... 1 1/2 daily (  30mg )  BP today: 144/74 Prior BP: 175/101 (04/17/2007)  Labs Reviewed: Creat: 1.3 (12/12/2006) Chol: 172 (12/12/2006)   HDL: 36.5 (12/12/2006)   LDL: 113 (12/12/2006)   TG: 115 (12/12/2006)   Problem # 2:  OSTEOARTHRITIS (ICD-715.90) Assessment: Unchanged still stays active  His updated medication list for this problem includes:    Sulindac 200 Mg Tabs (Sulindac) .Marland Kitchen... 1 two times a day as needed for arthritis pain    Vicodin 5-500 Mg Tabs (Hydrocodone-acetaminophen) .Marland Kitchen... 1/2--1 tab three times a day as needed for severe arthritis pain   Problem # 3:  BENIGN PROSTATIC HYPERTROPHY (ICD-600.00) Assessment: Unchanged voids okay with doxazosin  Problem # 4:  ACTINIC KERATOSIS (ICD-702.0) Assessment: New Rx 30 seconds x 2 Orders: Cryotherapy/Destruction benign or premalignant lesion (1st lesion)  ([address])   Complete Medication List: 1)  Proscar 5 Mg Tabs (Finasteride) .Marland Kitchen.. 1 daily 2)  Cardura 8 Mg Tabs (Doxazosin mesylate) .Marland Kitchen.. 1 daily 3)  Hydrochlorothiazide 25 Mg Tabs (Hydrochlorothiazide) .Marland Kitchen.. 1 daily 4)  Multivitamins Caps (Multiple vitamin) .Marland Kitchen.. 1 daily 5)   Fish Oil Caps (Omega-3 fatty acids caps) .... Takes otc 6)  Clonazepam 0.5 Mg Tabs (Clonazepam) .Marland Kitchen.. 1-2 at bedtime 7)  Simvastatin 20 Mg Tabs (Simvastatin) .Marland Kitchen.. 1 at bedtime 8)  Lisinopril 20 Mg Tabs (Lisinopril) .Marland Kitchen.. 1 1/2 daily (30mg ) 9)  Sulindac 200 Mg Tabs (Sulindac) .Marland Kitchen.. 1 two times a day as needed for arthritis pain 10)  Viagra 100 Mg Tabs (Sildenafil citrate) .... Take 1/2-1 tablet by mouth once a day 11)  Fibrer Con  .... 2 daily 12)  Vicodin 5-500 Mg Tabs (Hydrocodone-acetaminophen) .... 1/2--1 tab three times a day as needed for severe arthritis pain 13)  Omeprazole 20 Mg Cpdr (Omeprazole) .... One by mouth 1/2 hour prior to breakfast. 14)  Voltaren 1 % Gel (Diclofenac sodium) .... Apply a small to affected area qid. 15)  Mirapex 1 Mg Tabs (Pramipexole dihydrochloride) .Marland Kitchen.. 1-2 at bedtime for restless legs 16)  Glucosamine Sulfate 1000 Mg Caps (Glucosamine sulfate) .... 2 tablets per day by mouth   Patient Instructions: 1)  Please schedule a follow-up appointment in 6 months.    ]

## 2010-03-30 NOTE — Progress Notes (Signed)
Summary: Rx-Sulindac  Phone Note Refill Request Message from:  Fax from Pharmacy on December 10, 2007 9:07 AM  Refills Requested: Medication #1:  SULINDAC 200 MG TABS 1 two times a day as needed for arthritis pain   Last Refilled: 09/16/2007 # 180. [place] [phone]  Initial call taken by: Jasmine December,  December 10, 2007 9:08 AM      Prescriptions: SULINDAC 200 MG TABS (SULINDAC) 1 two times a day as needed for arthritis pain  #180 x 3   Entered and Authorized by:   Owens Loffler MD   Signed by:   Owens Loffler MD on 12/10/2007   Method used:   Electronically to        [facility]. # [phone]* (retail)       [address]       [place], [place]  [address]       Ph: [phone]       Fax: [phone]   RxID:   [id number]

## 2010-03-30 NOTE — Progress Notes (Signed)
Summary: wants something for knee pain  Phone Note Call from Patient   Caller: Patient Call For: Claris Gower MD Summary of Call: Pt states he has been taking sulindac and vicodin for his knee pain.  He says he tries not to take the vicodin because it is addictive and the sulindac isnt effective.  He is asking if he can have something that will work better than the sulindac. He wants a 30 day written script.  Follow-up for Phone Call        Okay to try tramadol can have small addictive potential but probably less than hydrocodone he can see if it helps Follow-up by: Claris Gower MD,  April 22, 2008 2:01 PM  Additional Follow-up for Phone Call Additional follow up Details #1::        spoke with patient and advised that rx ready for pickup Additional Follow-up by: Edwin Dada CMA,  April 22, 2008 2:26 PM    New/Updated Medications: TRAMADOL HCL 50 MG TABS (TRAMADOL HCL) 1/2 - 1 tab three times a day for pain   Prescriptions: TRAMADOL HCL 50 MG TABS (TRAMADOL HCL) 1/2 - 1 tab three times a day for pain  #90 x 0   Entered and Authorized by:   Claris Gower MD   Signed by:   Claris Gower MD on 04/22/2008   Method used:   Print then Give to Patient   RxID:   [id number]

## 2010-03-30 NOTE — Progress Notes (Signed)
Summary: refill request for proscar  Phone Note Refill Request Message from:  Fax from Pharmacy  Refills Requested: Medication #1:  PROSCAR 5 MG TABS 1 daily   Last Refilled: 07/14/2009 Faxed request from [facility] is on your desk.  Initial call taken by: [name] CMA,  October 12, 2009 2:57 PM  Follow-up for Phone Call        Rx completed in Dr. Brantley Stage Follow-up by: Claris Gower MD,  October 12, 2009 3:47 PM    Prescriptions: PROSCAR 5 MG TABS (FINASTERIDE) 1 daily  #90 x 3   Entered and Authorized by:   Claris Gower MD   Signed by:   Claris Gower MD on 10/12/2009   Method used:   Electronically to        [facility]* (retail)       [address] [place], [place]  [address]       Ph: [phone]       Fax: [phone]   RxID:   [id number]

## 2010-03-30 NOTE — Progress Notes (Signed)
Summary: Needs help on how to take his SIMVASTATIN   Phone Note Call from Patient Call back at Home Phone [phone]   Caller: Patient Summary of Call: Pt called to have a RX refill for his SIMVASTATIN today. He usually gets 40 mg tabs and has an elaborate way of taking them cutting 2 of  them in  1/2 and then one tablet in  1/3, equalling 3 tablets per week.  .Now today he states that the RX has been called in for 20mg  and he is in a state of confusion as to how to take the medication!!!   I explained that the reason the 20 mg. tablet was phoned in is because that is what is listed on his medication list and that he should have clarified how he was taking his medication when he last came into the office.  He requests  60 tabs instead of 30 with refills for 1 year so that he doesn't have to go into the pharmacy so often .  [facility] [id number]  Initial call taken by: Genice Rouge,  August 27, 2007 2:56 PM  Follow-up for Phone Call        Cordelia Pen- I cannot make changes to this mans dru g list until the lisinopril append is signed. Are you dose ? could you sign it and let me know? Follow-up by: Eliezer Lofts MD,  August 27, 2007 5:19 PM  Additional Follow-up for Phone Call Additional follow up Details #1::        I'm signed off needed clarification on Lisinopril. Additional Follow-up by: Jasmine December,  August 28, 2007 8:35 AM    Additional Follow-up for Phone Call Additional follow up Details #2::    Refill sent for 40mg  . Review with pt to make sure correct, I was a little confused about how he is taking the med, so I wrote "take as directed" Follow-up by: Eliezer Lofts MD,  August 28, 2007 12:15 PM  New/Updated Medications: SIMVASTATIN 40 MG  TABS (SIMVASTATIN) Take as directed   Prescriptions: SIMVASTATIN 40 MG  TABS (SIMVASTATIN) Take as directed  #60 x 11   Entered and Authorized by:   Eliezer Lofts MD   Signed by:   Eliezer Lofts MD on 08/28/2007   Method used:   Electronically sent to ...     [facility] # [place], [place]  [address]       Ph: [phone]       Fax: [phone]   RxID:   [id number]     Appended Document: Needs help on how to take his SIMVASTATIN  he is very particular about many things. I don't know why he would take it like this but I don't have any problems with it

## 2010-03-30 NOTE — Progress Notes (Signed)
Summary: BP has been up  Phone Note Call from Patient Call back at Home Phone [phone]   Caller: Patient Call For: Claris Gower MD Summary of Call: Pt states his BP has been around 180/80 for the last couple of weeks and he is asking if he should increase any meds.  Uses [facility] [employer]. Initial call taken by: [name],  April 25, 2008 8:41 AM  Follow-up for Phone Call        he has not consistently been elevated here (though once in a while it has been up) As long as he has no chest pain, visual changes, headache, etc, I would just have him set up an appt and bring in his cuff so we can check to make sure it is accurate. IF it is, and he has been consistently high at home, increased meds may be appropriate Follow-up by: Claris Gower MD,  April 25, 2008 10:54 AM  Additional Follow-up for Phone Call Additional follow up Details #1::        spoke with patient and advised results, he will call back to setup appt. Additional Follow-up by: Edwin Dada CMA,  April 25, 2008 11:34 AM

## 2010-03-30 NOTE — Letter (Signed)
Summary: Surgical Clearance Requests from [facility] and [facility]  Surgical Clearance Requests from [facility] and [facility]   Imported By: Virgia Land 11/18/2009 14:17:20  _____________________________________________________________________  External Attachment:    Type:   Image     Comment:   External Document

## 2010-03-30 NOTE — Progress Notes (Signed)
Summary: cant tolerate trazadone  Phone Note Call from Patient Call back at Home Phone [phone]   Caller: Patient Call For: Claris Gower MD Summary of Call: Pt states he can't take trazadone to help him sleep because it activates his RLS.  He is asking for something else, says he won't take any sleep aid more than 2-3 times a week.  If you give Lorrin Mais he needs the 10 mg's.  He uses [facility] [employer]. Initial call taken by: [name] CMA,  October 06, 2008 8:40 AM  Follow-up for Phone Call        okay to use zolpidem 10mg  at bedtime as needed #30 x 0 Tell him I expect this to last at least 2 months and hopefully more Follow-up by: Claris Gower MD,  October 06, 2008 10:12 AM  Additional Follow-up for Phone Call Additional follow up Details #1::        Spoke with patient and advised results. and also called in rx to [facility] Additional Follow-up by: Edwin Dada CMA,  October 06, 2008 10:57 AM    New/Updated Medications: ZOLPIDEM TARTRATE 10 MG TABS (ZOLPIDEM TARTRATE) take 1 by mouth at bedtime as needed Prescriptions: ZOLPIDEM TARTRATE 10 MG TABS (ZOLPIDEM TARTRATE) take 1 by mouth at bedtime as needed  #30 x 0   Entered by:   Edwin Dada CMA   Authorized by:   Claris Gower MD   Signed by:   Edwin Dada CMA on 10/06/2008   Method used:   Telephoned to ...       [facility]* (retail)       [address] [place], [place]  [address]       Ph: [phone]       Fax: [phone]   RxID:   [id number]

## 2010-03-30 NOTE — Progress Notes (Signed)
Summary: refill request for tramadol  Phone Note Refill Request Message from:  Fax from Pharmacy  Refills Requested: Medication #1:  TRAMADOL HCL 50 MG TABS 1/2 - 1 tab three times a day for pain   Last Refilled: 04/22/2008 Faxed request from [facility].  Form is on your desk.  Initial call taken by: [name],  June 09, 2008 1:40 PM  Follow-up for Phone Call        okay #90 x 1  HCTZ okay x 1 year Follow-up by: Claris Gower MD,  June 09, 2008 1:48 PM  Additional Follow-up for Phone Call Additional follow up Details #1::        Rx faxed to pharmacy Additional Follow-up by: Edwin Dada CMA,  June 09, 2008 2:58 PM      Prescriptions: TRAMADOL HCL 50 MG TABS (TRAMADOL HCL) 1/2 - 1 tab three times a day for pain  #90 x 1   Entered by:   Edwin Dada CMA   Authorized by:   Claris Gower MD   Signed by:   Edwin Dada CMA on 06/09/2008   Method used:   Electronically to        [facility]* (retail)       [place], [place]  [address]       Ph: [phone]       Fax: [phone]   RxID:   [id number] HYDROCHLOROTHIAZIDE 25 MG TABS (HYDROCHLOROTHIAZIDE) 1 daily  #90 x 3   Entered by:   Edwin Dada CMA   Authorized by:   Claris Gower MD   Signed by:   Edwin Dada CMA on 06/09/2008   Method used:   Electronically to        [facility]* (retail)       [address] [place], [place]  [address]       Ph: [phone]       Fax: [phone]   RxID:   [id number]

## 2010-03-30 NOTE — Miscellaneous (Signed)
Summary: [facility] Discharge Summary  [facility] Discharge Summary   Imported By: Virgia Land 08/14/2007 14:54:15  _____________________________________________________________________  External Attachment:    Type:   Image     Comment:   External Document

## 2010-03-30 NOTE — Assessment & Plan Note (Signed)
Summary: ROA FOR 4-6 WEEK FOLLOW-UP   Vital Signs:  Patient profile:   77 year old male Weight:      185 pounds Temp:     97.7 degrees F oral Pulse rate:   60 / minute Pulse rhythm:   regular BP sitting:   148 / 70  (left arm) Cuff size:   large  Vitals Entered By: Edwin Dada CMA Deborra Medina) (August 12, 2009 9:35 AM) CC: 4-6 week follow-up   History of Present Illness: Feels better No headache or dizziness couldn't tolerate the bisoprolol went back on the regular HCTZ dose tried doubling the doxazosin--too much, so now on 1.5 tabs  sleep is better now without meds noticed that he is putting his mind on alert and is working to minimize this and sleeping better  Had spell of 5-7 seconds talking to son like he was talking to his brother who died at age 22 Snapped out of it and realized he had zoned out  no other memory or thinking problems  Allergies: 1)  ! * Statins 2)  Lipitor 3)  Pravachol  Past History:  Past medical, surgical, family and social histories (including risk factors) reviewed, and no changes noted (except as noted below).  Past Medical History: Reviewed history from 01/26/2009 and no changes required. Diverticulosis, colon05/2005 GERD Hyperlipidemia Hypertension Benign prostatic hypertrophy Osteoarthritis, severe, B knees Renal insufficiency  Past Surgical History: Reviewed history from 05/19/2009 and no changes required. R 4th finger surgery Tonsils as child Lumbar spinal stenosis repair  06/2005 Bilateral total knee replacement, May 2010, Dr. Marry Guan Cataracts--bilateral 2011  Dr Dingledein  Family History: Reviewed history from 06/21/2006 and no changes required. Dad died @72  of "clogged arteries" Mom died @ 58 of old age, had HTN 1 brother died @24  of polia No prostate or colon cancer  Social History: Retired--school principal Widowed  2010-2 adopted children Never Smoked Alcohol use-no    Review of Systems       Appetite is  voracious weight is up 5# has not had sexual problems Only uses diuretic 2-3 times per week voiding okay  Physical Exam  General:  alert and normal appearance.   Neck:  supple, no masses, no thyromegaly, no carotid bruits, and no cervical lymphadenopathy.   Lungs:  normal respiratory effort and normal breath sounds.   Heart:  normal rate, regular rhythm, no murmur, and no gallop.   Extremities:  no sig edema Psych:  normally interactive, good eye contact, not anxious appearing, and not depressed appearing.     Impression & Recommendations:  Problem # 1:  HYPERTENSION (ICD-401.9) Assessment Improved  better continue current regimen check renal  discussed episode of talking to dead brother---nothing clear cut. Will just observe for any other symptoms  His updated medication list for this problem includes:    Furosemide 40 Mg Tabs (Furosemide) .Marland Kitchen... 1 tab daily  as needed for edema    Doxazosin Mesylate 4 Mg Tabs (Doxazosin mesylate) .Marland Kitchen... 1& 1/2 tab daily for enlarged prostate    Hydrochlorothiazide 25 Mg Tabs (Hydrochlorothiazide) .Marland Kitchen... Take 1 by mouth once daily  BP today: 148/70 Prior BP: 136/80 (07/07/2009)  Labs Reviewed: K+: 3.7 (05/19/2009) Creat: : 1.7 (05/19/2009)   Chol: 166 (01/16/2009)   HDL: 44.50 (01/16/2009)   LDL: 111 (01/16/2009)   TG: 55.0 (01/16/2009)  Orders: TLB-Renal Function Panel (80069-RENAL) Venipuncture [id number])  Complete Medication List: 1)  Proscar 5 Mg Tabs (Finasteride) .Marland Kitchen.. 1 daily 2)  Clonazepam 0.5 Mg Tabs (Clonazepam) .Marland Kitchen.. 1-2  at bedtime 3)  Simvastatin 40 Mg Tabs (Simvastatin) .... Take 1/2 by mouth once daily 4)  Mirapex 1 Mg Tabs (Pramipexole dihydrochloride) .Marland Kitchen.. 1-2 at bedtime for restless legs 5)  Furosemide 40 Mg Tabs (Furosemide) .Marland Kitchen.. 1 tab daily  as needed for edema 6)  Zolpidem Tartrate 10 Mg Tabs (Zolpidem tartrate) .... Take 1 by mouth at bedtime as needed 7)  Doxazosin Mesylate 4 Mg Tabs (Doxazosin mesylate) .Marland Kitchen.. 1& 1/2  tab daily for enlarged prostate 8)  Potassium Chloride 20 Meq Pack (Potassium chloride) .... Take 1 by mouth once daily when taking furosemide 9)  Hydrochlorothiazide 25 Mg Tabs (Hydrochlorothiazide) .... Take 1 by mouth once daily 10)  Omeprazole 20 Mg Cpdr (Omeprazole) .... One by mouth 1/2 hour prior to breakfast. 11)  Multivitamins Caps (Multiple vitamin) .Marland Kitchen.. 1 daily 12)  Fish Oil Caps (Omega-3 fatty acids caps) .... Takes otc 22)  Zinc-c-b6 15-60-5 Mg Lozg (Zinc-c-b6) .... Take 1 by mouth once daily  Patient Instructions: 1)  Please schedule a follow-up appointment in 4 months .   Current Allergies (reviewed today): ! * STATINS LIPITOR PRAVACHOL

## 2010-03-30 NOTE — Assessment & Plan Note (Signed)
Summary: 8:30 4 m f/u dlo   Vital Signs:  Patient profile:   77 year old male Weight:      183 pounds Temp:     97.6 degrees F oral Pulse rate:   60 / minute Pulse rhythm:   regular BP sitting:   120 / 80  (left arm) Cuff size:   large  Vitals Entered By: Edwin Dada CMA Deborra Medina) (May 19, 2009 8:28 AM) CC: 4 month follow-up   History of Present Illness: Had both cataracts removed in past 2 months Mohs surgery left cheek some other skin procedures  Able to run at full speed in bursts on tennis court Gets tingling in legs--no sig pain now hasn't really needed pain meds  No problems with ziac BP fine No chest pain  No SOB  No problems with statin no myalgia no stomach trouble  Allergies: 1)  ! * Statins 2)  Lipitor 3)  Pravachol  Past History:  Past medical, surgical, family and social histories (including risk factors) reviewed for relevance to current acute and chronic problems.  Past Medical History: Reviewed history from 01/26/2009 and no changes required. Diverticulosis, colon05/2005 GERD Hyperlipidemia Hypertension Benign prostatic hypertrophy Osteoarthritis, severe, B knees Renal insufficiency  Past Surgical History: R 4th finger surgery Tonsils as child Lumbar spinal stenosis repair  06/2005 Bilateral total knee replacement, May 2010, Dr. Marry Guan Cataracts--bilateral 2011  Dr Dingledein  Family History: Reviewed history from 06/21/2006 and no changes required. Dad died @72  of "clogged arteries" Mom died @ 49 of old age, had HTN 1 brother died @24  of polia No prostate or colon cancer  Social History: Reviewed history from 09/03/2008 and no changes required. Retired--school principal Married--2 adopted children Never Smoked Alcohol use-no Wife had Stage 4 bronchoalveolar cancer - she died Spring 2010  Review of Systems       Appetite is up weight up 4# sleep is fragmented---up every 2 hours and will get up and walk around a  while Does nap in day----still has slight feeling of tiredness but not a major problem Tries to avoid ambien------uses motion sickness pills at night  Physical Exam  General:  alert and normal appearance.   Neck:  supple, no masses, no thyromegaly, no carotid bruits, and no cervical lymphadenopathy.   Lungs:  normal respiratory effort and normal breath sounds.   Heart:  normal rate, regular rhythm, no murmur, and no gallop.   Abdomen:  soft and non-tender.   Extremities:  no edema Psych:  normally interactive, good eye contact, not anxious appearing, and not depressed appearing.     Impression & Recommendations:  Problem # 1:  HYPERTENSION (ICD-401.9) Assessment Unchanged  good control now will check labs  His updated medication list for this problem includes:    Hydrochlorothiazide 25 Mg Tabs (Hydrochlorothiazide) .Marland Kitchen... 1 daily    Furosemide 40 Mg Tabs (Furosemide) .Marland Kitchen... 1 tab daily for edema    Doxazosin Mesylate 4 Mg Tabs (Doxazosin mesylate) .Marland Kitchen... 1 tab daily for enlarged prostate    Bisoprolol-hydrochlorothiazide 2.5-6.25 Mg Tabs (Bisoprolol-hydrochlorothiazide) .Marland Kitchen... 1 daily for high blood pressure  BP today: 120/80 Prior BP: 150/110 (03/17/2009)  Labs Reviewed: K+: 3.5 (04/24/2009) Creat: : 1.7 (02/13/2009)   Chol: 166 (01/16/2009)   HDL: 44.50 (01/16/2009)   LDL: 111 (01/16/2009)   TG: 55.0 (01/16/2009)  Orders: TLB-Renal Function Panel (80069-RENAL) TLB-CBC Platelet - w/Differential (85025-CBCD) TLB-TSH (Thyroid Stimulating Hormone) (84443-TSH) Venipuncture [id number])  Problem # 2:  HYPERLIPIDEMIA (ICD-272.4) Assessment: Unchanged labs fine no trouble with  meds  His updated medication list for this problem includes:    Simvastatin 40 Mg Tabs (Simvastatin) .Marland Kitchen... Take as directed  Labs Reviewed: SGOT: 27 (01/16/2009)   SGPT: 24 (01/16/2009)   HDL:44.50 (01/16/2009), 29.1 (07/24/2007)  LDL:111 (01/16/2009), 101 (07/24/2007)  Chol:166 (01/16/2009), 147  (07/24/2007)  Trig:55.0 (01/16/2009), 87 (07/24/2007)  Problem # 3:  SLEEP DISORDER (ICD-780.50) Assessment: Unchanged tries to limit ambien no major daytime problems though  Problem # 4:  BENIGN PROSTATIC HYPERTROPHY (ICD-600.00) Assessment: Unchanged voiding okay on proscar and doxazosin  Problem # 5:  GERD (ICD-530.81) Assessment: Unchanged okay with med  His updated medication list for this problem includes:    Omeprazole 20 Mg Cpdr (Omeprazole) ..... One by mouth 1/2 hour prior to breakfast.  Complete Medication List: 1)  Proscar 5 Mg Tabs (Finasteride) .Marland Kitchen.. 1 daily 2)  Hydrochlorothiazide 25 Mg Tabs (Hydrochlorothiazide) .Marland Kitchen.. 1 daily 3)  Clonazepam 0.5 Mg Tabs (Clonazepam) .Marland Kitchen.. 1-2 at bedtime 4)  Simvastatin 40 Mg Tabs (Simvastatin) .... Take as directed 5)  Omeprazole 20 Mg Cpdr (Omeprazole) .... One by mouth 1/2 hour prior to breakfast. 6)  Mirapex 1 Mg Tabs (Pramipexole dihydrochloride) .Marland Kitchen.. 1-2 at bedtime for restless legs 7)  Furosemide 40 Mg Tabs (Furosemide) .Marland Kitchen.. 1 tab daily for edema 8)  Zolpidem Tartrate 10 Mg Tabs (Zolpidem tartrate) .... Take 1 by mouth at bedtime as needed 9)  Doxazosin Mesylate 4 Mg Tabs (Doxazosin mesylate) .Marland Kitchen.. 1 tab daily for enlarged prostate 10)  Tramadol Hcl 50 Mg Tabs (Tramadol hcl) .... 1/2-1 tab by mouth three times a day as needed for severe pain 11)  Bisoprolol-hydrochlorothiazide 2.5-6.25 Mg Tabs (Bisoprolol-hydrochlorothiazide) .Marland Kitchen.. 1 daily for high blood pressure 12)  Potassium Chloride 20 Meq Pack (Potassium chloride) .... Take 1 by mouth two times a day 13)  Acai Berry 500 Mg Caps (Acai) .... Take 1 by mouth once daily 14)  Multivitamins Caps (Multiple vitamin) .Marland Kitchen.. 1 daily 15)  Fish Oil Caps (Omega-3 fatty acids caps) .... Takes otc 16)  B Complex Vitamins Caps (B complex vitamins) .... Take 1 by mouth once daily  Patient Instructions: 1)  Please schedule a follow-up appointment in 6 months .   Current Allergies (reviewed  today): ! * STATINS LIPITOR PRAVACHOL

## 2010-03-30 NOTE — Assessment & Plan Note (Signed)
Summary: F/U PER DR Blu Lori/[facility]   Vital Signs:  Patient profile:   77 year old male Height:      67.4 inches Weight:      170.2 pounds BMI:     26.44 Temp:     97.8 degrees F oral Pulse rate:   80 / minute Pulse rhythm:   regular BP sitting:   140 / 80  (left arm) Cuff size:   regular  Vitals Entered By: Zenda Alpers CMA (September 03, 2008 9:39 AM)  History of Present Illness: Chief complaint feet and ankels are swollen  elderly gentleman following up on several issues, recent hospitalization with intensive care unit stay after bilateral total knee replacement, and the patient subsequently became hypotensive, disoriented, delirious, and required great care in the intensive care unit. At the time his blood pressure systolic got down to 50 at one point.  Currently he is here following up about lower extremity edema. He has had an extensive cardiac workup in the past, had a normal echocardiogram several months ago, had a cardiology consult in the hospital, and had a normal CT angiogram evaluation for pulmonary embolus which was normal. He also had some recent renal failure acutely prior to surgery. Subsequently, diuretics were decreased, and he had resolution of this. He has been intolerant to compression stockings in the past. He has no known arterial disease, no known vascular claudication. In discussion with his orthopedic surgeon, and ID of regarding vascular compromise was brought forth to the patient, and he is concerned for potential vascular pathology in his lower extremity. He has had normal hepatic function in the past as well.  All Hospital consults and discharge summaries reviewed.  Dysuria: the patient has had significant dysuria of the last several days, and he did require multiple catheterizations postoperatively, and did have reportedly a urinary tract infection requiring antibiotics at least once since that time.  Allergies: 1)  ! * Statins 2)  Lipitor 3)   Pravachol  Past History:  Past medical, surgical, family and social histories (including risk factors) reviewed, and no changes noted (except as noted below).  Past Medical History: Reviewed history from 02/04/2008 and no changes required. Diverticulosis, colon05/2005 GERD Hyperlipidemia Hypertension Benign prostatic hypertrophy Osteoarthritis, severe, B knees  Past Surgical History: R 4th finger surgery Tonsils as child Lumbar spinal stenosis repair  06/2005 Bilateral total knee replacement, May 2010, Dr. Marry Guan  Family History: Reviewed history from 06/21/2006 and no changes required. Dad died @72  of "clogged arteries" Mom died @ 36 of old age, had HTN 1 brother died @24  of polia No prostate or colon cancer  Social History: Reviewed history from 06/21/2006 and no changes required. 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 CV:  Complains of swelling of feet and weight gain; denies chest pain or discomfort, difficulty breathing at night, difficulty breathing while lying down, and leg cramps with exertion; LE diffuse edema. MS:  knees feel great.  Physical Exam  General:  alert, well-developed, well-nourished, and well-hydrated.  Elderly. Head:  normocephalic and atraumatic.   Ears:  no external deformities.   Lungs:  normal respiratory effort.  normal breath sounds.   Heart:  regular rhythm, no murmur, and no gallop.   Extremities:  2+ B LE edema decreased DP and PT pulses bilaterally - difficult to eval with significant edema at feet 3+ pedal edema  Skin:  c/w venous stasis disease B LE no ulcers Psych:  Oriented X3, normally interactive, and good eye contact.     Impression & Recommendations:  Problem # 1:  EDEMA- LOCALIZED (ICD-782.3) Assessment Deteriorated  B LE edema non-cardiac in origin - check  liver and renal function  Vascular evaluation reasonable - d/w patient, elderly at [facility] - will have him see Dr. Hulda Humphrey for eval. ABI's may be of benefit.  On my eval I think most c/w venous stasis disease - typical skin changes on exam. Patient will not wear compression hose. Start lasix 20 mg, will have to recheck BMP at next office visit.  His updated medication list for this problem includes:    Hydrochlorothiazide 25 Mg Tabs (Hydrochlorothiazide) .Marland Kitchen... 1 daily    Furosemide 20 Mg Tabs (Furosemide) .Marland Kitchen... 1 by mouth daily every morning  Orders: Venipuncture [id number]) [facility] (Vascular) TLB-BMP (Basic Metabolic Panel-BMET) (99991111) TLB-Hepatic/Liver Function Pnl (80076-HEPATIC)  Problem # 2:  UTI (ICD-599.0) Assessment: New  recent multiple caths Treat and culture UA +  His updated medication list for this problem includes:    Ciprofloxacin Hcl 250 Mg Tabs (Ciprofloxacin hcl) .Marland Kitchen... 1 by mouth two times a day  Complete Medication List: 1)  Proscar 5 Mg Tabs (Finasteride) .Marland Kitchen.. 1 daily 2)  Cardura 2 Mg Tabs (doxazosin Mesylate)  .Marland Kitchen.. 1 daily 3)  Hydrochlorothiazide 25 Mg Tabs (Hydrochlorothiazide) .Marland Kitchen.. 1 daily 4)  Multivitamins Caps (Multiple vitamin) .Marland Kitchen.. 1 daily 5)  Fish Oil Caps (Omega-3 fatty acids caps) .... Takes otc 6)  Clonazepam 0.5 Mg Tabs (Clonazepam) .Marland Kitchen.. 1-2 at bedtime 7)  Simvastatin 40 Mg Tabs (Simvastatin) .... Take as directed 8)  Lisinopril 40 Mg Tabs (Lisinopril) .... Take 1/2 tablet by mouth once daily 9)  Omeprazole 20 Mg Cpdr (Omeprazole) .... One by mouth 1/2 hour prior to breakfast. 10)  Mirapex 1 Mg Tabs (Pramipexole dihydrochloride) .Marland Kitchen.. 1-2 at bedtime for restless legs 11)  Oxycodone Hcl 5 Mg Tabs (Oxycodone hcl) .Marland Kitchen.. 1 tab three times a day as needed for severe pain 12)  Furosemide 20 Mg Tabs (Furosemide) .Marland Kitchen.. 1 by mouth daily every morning 13)  Ciprofloxacin Hcl 250 Mg Tabs (Ciprofloxacin hcl) .Marland Kitchen.. 1 by mouth two times a  day  Other Orders: Specimen Handling (99000) UA Dipstick w/o Micro (manual) (81002) T-Culture, Urine [id number])  Patient Instructions: 1)  Referral Appointment Information 2)  Day/Date: 3)  Time: 4)  Place/MD: 5)  Address: 6)  Phone/Fax: 7)  Patient given appointment information. Information/Orders faxed/mailed.  8)  Start Lasix 9)  Take antibiotics 10)  f/u 1 month with DR. Silvio Pate Prescriptions: OXYCODONE HCL 5 MG TABS (OXYCODONE HCL) 1 tab three times a day as needed for severe pain  #30 x 0   Entered and Authorized by:   Owens Loffler MD   Signed by:   Owens Loffler MD on 09/03/2008   Method used:   Print then Give to Patient   RxID:   [id number] CIPROFLOXACIN HCL 250 MG TABS (CIPROFLOXACIN HCL) 1 by mouth two times a day  #14 x 0   Entered and Authorized by:   Owens Loffler MD   Signed by:   Owens Loffler MD on 09/03/2008   Method used:   Print then Give to Patient   RxID:   [id number] FUROSEMIDE 20 MG TABS (FUROSEMIDE) 1 by mouth daily every morning  #31 x 3   Entered and Authorized by:   Owens Loffler MD   Signed by:  Owens Loffler MD on 09/03/2008   Method used:   Print then Give to Patient   RxID:   [id number]   Prior Medications (reviewed today): PROSCAR 5 MG TABS (FINASTERIDE) 1 daily HYDROCHLOROTHIAZIDE 25 MG TABS (HYDROCHLOROTHIAZIDE) 1 daily MULTIVITAMINS  CAPS (MULTIPLE VITAMIN) 1 daily FISH OIL  CAPS (OMEGA-3 FATTY ACIDS CAPS) takes OTC CLONAZEPAM 0.5 MG TABS (CLONAZEPAM) 1-2 at bedtime SIMVASTATIN 40 MG  TABS (SIMVASTATIN) Take as directed LISINOPRIL 40 MG  TABS (LISINOPRIL) Take 1/2 tablet by mouth once daily OMEPRAZOLE 20 MG  CPDR (OMEPRAZOLE) one by mouth 1/2 hour prior to breakfast. MIRAPEX 1 MG  TABS (PRAMIPEXOLE DIHYDROCHLORIDE) 1-2 at bedtime for restless legs OXYCODONE HCL 5 MG TABS (OXYCODONE HCL) 1 tab three times a day as needed for severe pain Current Allergies (reviewed today): ! *  STATINS LIPITOR PRAVACHOL  Laboratory Results   Urine Tests   Date/Time Reported: September 03, 2008 10:10 AM   Routine Urinalysis   Color: straw Appearance: Cloudy Glucose: negative   (Normal Range: Negative) Bilirubin: negative   (Normal Range: Negative) Ketone: negative   (Normal Range: Negative) Spec. Gravity: 1.015   (Normal Range: 1.003-1.035) Blood: large   (Normal Range: Negative) pH: 6.5   (Normal Range: 5.0-8.0) Protein: >=300   (Normal Range: Negative) Urobilinogen: 0.2   (Normal Range: 0-1) Nitrite: positive   (Normal Range: Negative) Leukocyte Esterace: large   (Normal Range: Negative)

## 2010-03-30 NOTE — Progress Notes (Signed)
Summary: wants to have urine checked  Phone Note Call from Patient Call back at Home Phone [phone]   Caller: Patient Summary of Call: Pt finished the abx for his UTI and he wants to come in to have his urine checked to be sure that it has cleared up.  Please advise. Initial call taken by: [name] CMA,  September 11, 2008 9:49 AM  Follow-up for Phone Call        That is not really necessary - his urine will be sterile, so any culture will not grow out bacteria, and the dip would be clean.  If he has problems in the future or pain with urination, fever, then reeval.  Follow-up by: Owens Loffler MD,  September 11, 2008 10:11 AM  Additional Follow-up for Phone Call Additional follow up Details #1::        Patient advised  Additional Follow-up by: Zenda Alpers CMA,  September 11, 2008 10:13 AM

## 2010-03-30 NOTE — Consult Note (Signed)
Summary: [facility]   Imported By: Edmonia James 08/04/2008 13:46:18  _____________________________________________________________________  External Attachment:    Type:   Image     Comment:   External Document

## 2010-03-30 NOTE — Progress Notes (Signed)
Summary: Rx-Clonzapam and Proscar  Phone Note Refill Request Message from:  Pharmacy-Frank at [facility]  on March 20, 2008 11:13 AM  Refills Requested: Medication #1:  PROSCAR 5 MG TABS 1 daily  Medication #2:  CLONAZEPAM 0.5 MG TABS 1-2 at bedtime Pharmacy called because patient is fed up with [facility] and wants to get his prescriptions transferred to [facility].  He needs these two Rx called in to [facility] so they can be delivered to him at [facility]. Jasmine December  March 20, 2008 11:16 AM    Follow-up for Phone Call        okay x 1 year for proscar He got 3 month Rx for clonazepam only 1 month ago so too soon to do that one Follow-up by: Claris Gower MD,  March 21, 2008 7:55 AM  Additional Follow-up for Phone Call Additional follow up Details #1::        rx sent to pharmacy. Additional Follow-up by: Edwin Dada CMA,  March 21, 2008 9:45 AM      Prescriptions: PROSCAR 5 MG TABS (FINASTERIDE) 1 daily  #90 x 3   Entered by:   Edwin Dada CMA   Authorized by:   Claris Gower MD   Signed by:   Edwin Dada CMA on 03/21/2008   Method used:   Electronically to        [facility] (retail)       [address] [place],   [address]       Ph: [phone]       Fax: [phone]   RxID:   [id number]

## 2010-03-30 NOTE — Assessment & Plan Note (Signed)
Summary: CORTISONE SHOT L KNEE/CLE   Vital Signs:  Patient Profile:   77 Years Old Male Height:     67.4 inches (171.20 cm) Weight:      183.50 pounds (83.41 kg) Temp:     97.8 degrees F (36.56 degrees C) oral Pulse rate:   80 / minute Pulse rhythm:   regular BP sitting:   130 / 80  (left arm) Cuff size:   regular  Vitals Entered By: Jasmine December (February 04, 2008 8:39 AM)                 Chief Complaint:  Cortisone injection in knees/need refill on HCTZ.  History of Present Illness: 77 year old male  1. Knee: B, severe OA. L knee recently exacerbated with twisting. Unable to play tennis now and having difficulty getting around. Has seen Dr. Marry Guan and Dr. Ricki Rodriguez in the past, both of whom recommended TKR when the patient was ready.  2. HTN: refill HCTZ, stable  L ankle bothering him.  3. Spinal stenosis surgery a few years ago. [name] in [place]. Sometimes radiating, sometimes off balance. More on L. Couple of months. Comes on slowly. Did hit his left side, x-rays taken at [facility], negative.   4. Grief reaction: Wife dying, tearful during exam, thinks she may be in the last few weeks of her life. Has been married now for more than 50 years.  5. Swelling, B LE, reviewed Echo with the patient, normal EF. No liver or kidney failure.  Basic Metabolic Panel: reviewed last resultsNa:        Cl:        K+:        HCO3:  BUN:  32 (11/28/2007 10:57:00 AM)   BS:      96 (11/28/2007 10:57:00 AM)Cr:      1.4 (11/28/2007 10:57:00 AM)   FBS:       Hgb: 13.9 (11/28/2007 10:57:00 AM)Hct: 40.3 (11/28/2007 10:57:00 AM)Hemoccult: H. Pylori: Amylase: LDH: SGOT: 35 (11/28/2007 10:57:00 AM)SGPT: 41 (11/28/2007 10:57:00 AM)T.Bili: 1.1 (11/28/2007 10:57:00 AM)AlkPhos: 105 (11/28/2007 10:57:00 AM)Colonoscopy:      Current Allergies: ! * STATINS LIPITOR PRAVACHOL  Past Medical History:    Reviewed history from 12/12/2006 and no changes required:       Diverticulosis, colon05/2005  GERD       Hyperlipidemia       Hypertension       Benign prostatic hypertrophy       Osteoarthritis, severe, B knees  Past Surgical History:    Reviewed history from 05/25/2007 and no changes required:       R 4th finger surgery       Tonsils as child       Lumbar spinal stenosis repair  06/2005     Review of Systems       REVIEW OF SYSTEMS  GEN: No systemic complaints, no fevers, chills, sweats, or other acute illnesses  MSK: Detailed in the HPI GI: tolerating PO intake without difficulty  Neuro: No numbness, parasthesias, or tingling associated.  Otherwise the pertinent positives of the ROS are noted above. Further ROS may be demarcated in the ROS section of Centricity, If none, then this plus the HPI constitutes the ROS.     Physical Exam  General:     alert, well-developed, well-nourished, well-hydrated, appropriate dress, normal appearance, and healthy-appearing.   Head:     normocephalic and atraumatic.   Ears:     no external deformities.  Nose:     no external deformity.   Lungs:     normal respiratory effort.   Msk:     right knee: Loss of extension by 5, flexion to 110. There is some crepitus with motion. There is no ballotable effusion. Stable varus and valgus stress. Negative Lachman. Negative anterior posterior drawer test. Negative Murray's test.  Left knee: Minimal effusion. Loss of extension by 5, flexion to approximately 100. There is pain with motion. No ballotable effusion. Stable varus without distress. The patient is tender along the medial joint line. Nontender lateral joint line. Nontender along patellar tendon. Nontender in the has anserine this region. No posterior effusion. McMurray is equivocal Impossible to do flexion pinch. Impressive crepitus with motion. Improved intervally compared to last exam Extremities:     no c/c/e    Impression & Recommendations:  Problem # 1:  OSTEOARTHRITIS (ICD-715.90) Assessment:  Deteriorated Severe, reviewed WB AP films that the patient has and he has severe loss of medial joint space, significant osteophyte formation.  >25 minutes spent in total face to face time with the patient with >50% of time spent in counselling and coordination of care: Reviewed OA, and spent > 50% of time discussing and counselling regarding his wife's terminal illness and coping with that. He was upset. Also reviewed spinal stenosis and L lateral leg pain.  Right Knee Patient verbally consented to procedure. Risks, benefits, and alternatives explained. Sterilely prepped with betadine. Ethyl cholride used for anesthesia. 9 cc 0.5% Marcaine mixed with 1 cc of Kenalog 40 mg injected using the anterolateral approach without difficulty. No complications with procedure and tolerated well. Patient had decreased pain post-injection.   Left Knee Patient verbally consented to procedure. Risks, benefits, and alternatives explained. Sterilely prepped with betadine. Ethyl cholride used for anesthesia. 9 cc 0.5% Marcaine mixed with 1 cc of Kenalog 40 mg injected using the anterolateral approach without difficulty. No complications with procedure and tolerated well. Patient had decreased pain post-injection.   His updated medication list for this problem includes:    Sulindac 200 Mg Tabs (Sulindac) .Marland Kitchen... 1 two times a day as needed for arthritis pain    Vicodin 5-500 Mg Tabs (Hydrocodone-acetaminophen) .Marland Kitchen... 1/2--1 tab three times a day as needed for severe arthritis pain  Orders: Joint Aspirate / Injection, Large (20610) Kenalog 10 mg inj [id number])   Problem # 2:  HYPERTENSION (ICD-401.9) Refill HCTZ  His updated medication list for this problem includes:    Cardura 8 Mg Tabs (Doxazosin mesylate) .Marland Kitchen... 1 daily    Hydrochlorothiazide 25 Mg Tabs (Hydrochlorothiazide) .Marland Kitchen... 1 daily    Lisinopril 40 Mg Tabs (Lisinopril) .Marland Kitchen... Take 1/2 tablet by mouth once daily    Furosemide 20 Mg Tabs (Furosemide) .Marland Kitchen... 1  by mouth daily   Problem # 3:  EDEMA- LOCALIZED (ICD-782.3) Lasix 20 mg, 1 mg daily, if not with significant swelling, does not always have to take Lasix  His updated medication list for this problem includes:    Hydrochlorothiazide 25 Mg Tabs (Hydrochlorothiazide) .Marland Kitchen... 1 daily    Furosemide 20 Mg Tabs (Furosemide) .Marland Kitchen... 1 by mouth daily   Problem # 4:  GRIEF REACTION, ACUTE (ICD-309.0) Assessment: New Counselled about wife.  Problem # 5:  SPINAL STENOSIS, LUMBAR (ICD-724.02) L lateral pain, may be return of spinal stenosis, but given his fall several months ago, may all relate to that injury. No fx. identified at that time. I would not image - may follow-up with spine surgeon after taking care of  acute issues with his wife.  Complete Medication List: 1)  Proscar 5 Mg Tabs (Finasteride) .Marland Kitchen.. 1 daily 2)  Cardura 8 Mg Tabs (Doxazosin mesylate) .Marland Kitchen.. 1 daily 3)  Hydrochlorothiazide 25 Mg Tabs (Hydrochlorothiazide) .Marland Kitchen.. 1 daily 4)  Multivitamins Caps (Multiple vitamin) .Marland Kitchen.. 1 daily 5)  Fish Oil Caps (Omega-3 fatty acids caps) .... Takes otc 6)  Clonazepam 0.5 Mg Tabs (Clonazepam) .Marland Kitchen.. 1-2 at bedtime 7)  Simvastatin 40 Mg Tabs (Simvastatin) .... Take as directed 8)  Lisinopril 40 Mg Tabs (Lisinopril) .... Take 1/2 tablet by mouth once daily 9)  Sulindac 200 Mg Tabs (Sulindac) .Marland Kitchen.. 1 two times a day as needed for arthritis pain 10)  Viagra 100 Mg Tabs (Sildenafil citrate) .... Take 1/2-1 tablet by mouth once a day 11)  Fibrer Con  .... 2 daily 12)  Vicodin 5-500 Mg Tabs (Hydrocodone-acetaminophen) .... 1/2--1 tab three times a day as needed for severe arthritis pain 13)  Omeprazole 20 Mg Cpdr (Omeprazole) .... One by mouth 1/2 hour prior to breakfast. 14)  Voltaren 1 % Gel (Diclofenac sodium) .... Apply a small to affected area qid. 15)  Mirapex 1 Mg Tabs (Pramipexole dihydrochloride) .Marland Kitchen.. 1-2 at bedtime for restless legs 16)  Glucosamine Sulfate 1000 Mg Caps (Glucosamine sulfate) .... 2  tablets per day by mouth 17)  Furosemide 20 Mg Tabs (Furosemide) .Marland Kitchen.. 1 by mouth daily   Patient Instructions: 1)  After you have an injection of any joint, the numbing medicine will make it feel better for a few hours. Later tonight, it is common for the joint to feel worse. The steroid will take 48-72 hours to start working - it is the thing that will likely provide the most relief. 2)  Ice the joint where you had the injection at least 2-3 times a day for 20 minutes for 3 days. If have had swelling, pain in the joint itself, ice for 1 week. 3)  You can use an ice bag, frozen peas or corn, an ice pack - all work    Prescriptions: FUROSEMIDE 20 MG TABS (FUROSEMIDE) 1 by mouth daily  #30 x 4   Entered and Authorized by:   Owens Loffler MD   Signed by:   Owens Loffler MD on 02/04/2008   Method used:   Electronically to        [facility] (retail)       [place], [place]  [address]       Ph: [phone]       Fax: [phone]   RxID:   [id number] HYDROCHLOROTHIAZIDE 25 MG TABS (HYDROCHLOROTHIAZIDE) 1 daily  #30 x 5   Entered and Authorized by:   Owens Loffler MD   Signed by:   Owens Loffler MD on 02/04/2008   Method used:   Electronically to        [facility] (retail)       [place],   [address]       Ph: [phone]       Fax: [phone]   RxID:   [id number]  ]

## 2010-03-30 NOTE — Progress Notes (Signed)
Summary: CLONAZEPAM  Phone Note Refill Request Message from:  Mikeal Hawthorne  on May 26, 2008 4:06 PM  Refills Requested: Medication #1:  CLONAZEPAM 0.5 MG TABS 1-2 at bedtime   Last Refilled: 03/21/2008 form on your desk   Method Requested: Fax to [facility] Initial call taken by: Edwin Dada CMA,  May 26, 2008 4:07 PM  Follow-up for Phone Call        okay #120 x 0 Follow-up by: Claris Gower MD,  May 26, 2008 4:50 PM  Additional Follow-up for Phone Call Additional follow up Details #1::        Rx faxed to pharmacy Additional Follow-up by: Edwin Dada CMA,  May 26, 2008 4:55 PM      Prescriptions: CLONAZEPAM 0.5 MG TABS (CLONAZEPAM) 1-2 at bedtime  #120 x 0   Entered by:   Edwin Dada CMA   Authorized by:   Claris Gower MD   Signed by:   Edwin Dada CMA on 05/26/2008   Method used:   Handwritten   RxIDIU:[id number]

## 2010-03-30 NOTE — Progress Notes (Signed)
  Phone Note Call from Patient Call back at Home Phone [phone] Call back at [phone]   Caller: Patient Call For: Dr Silvio Pate Summary of Call: Pt. is scheduled to have Total left knee replacement on 05/22/08 by Dr.Hooten.  He needs a pre-op clearance from you.  When can I schedule the appt..  He would like to have it a.s.a.p. because he is having some issues w/ his blood pressure and Dr.Hooten won't do the surgery until that is under control.  Please advise. Initial call taken by: Virgia Land,  May 02, 2008 12:11 PM  Follow-up for Phone Call        okay to use 12:15-30 on the 17th First day back so bad idea but no better alternatives I can probably stay a little late that day if the nursing home is not slammed Follow-up by: Claris Gower MD,  May 02, 2008 1:16 PM  Additional Follow-up for Phone Call Additional follow up Details #1::        Pt scheduled appt. for 05/14/08 @ 12:15. Additional Follow-up by: Virgia Land,  May 02, 2008 1:22 PM

## 2010-03-30 NOTE — Progress Notes (Signed)
Summary: POTASSIUM  Phone Note Outgoing Call   Summary of Call: Please call His lab from the eye surgeon shows the potassium low again---at 2.9 Please have him start prescription KCL 47meq two times a day (send Rx #60 x 5) recheck here on Monday unless his surgery is scheduled before that Initial call taken by: Claris Gower MD,  April 22, 2009 2:18 PM  Follow-up for Phone Call        left message on machine for patient to return call.  Stephen Garrett Esto Stephen Garrett)  April 22, 2009 3:55 PM   Additional Follow-up for Phone Call Additional follow up Details #1::        Advised pt, his surgery is scheduled for monday.   Potassium called to [facility].  Please advise on when to recheck potassium. Additional Follow-up by: [name] CMA,  April 22, 2009 4:18 PM    Additional Follow-up for Phone Call Additional follow up Details #2::    please have him come in on Friday--need it drawn early and marked stat so we know it is okay for surgery Have him take the potassium three times a day since we don't have much time to correct Claris Gower MD,  April 22, 2009 6:39 PM  Spoke with patient and advised results. Lab appt scheduled, Terri and Aniceto Boss advised lab needs to be stat. Stephen Tamala Julian CMA Stephen Garrett)  April 23, 2009 10:25 AM   Thanks Follow-up by: Claris Gower MD,  April 23, 2009 1:22 PM  New/Updated Medications: POTASSIUM CHLORIDE 20 MEQ PACK (POTASSIUM CHLORIDE) take 1 by mouth two times a day POTASSIUM CHLORIDE 20 MEQ PACK (POTASSIUM CHLORIDE) take 1 by mouth two times a day Prescriptions: POTASSIUM CHLORIDE 20 MEQ PACK (POTASSIUM CHLORIDE) take 1 by mouth two times a day  #60 x 5   Entered by:   Edwin Dada CMA ([place])   Authorized by:   Claris Gower MD   Signed by:   Edwin Dada CMA ([place]) on 04/22/2009   Method used:   Electronically to        [facility]* (retail)       [address] [place], [place]  [address]       Ph: [phone]       Fax: [phone]   RxID:   [id number]

## 2010-03-30 NOTE — Progress Notes (Signed)
  Phone Note Refill Request [facility]  Refills Requested: Medication #1:  CLONAZEPAM 0.5 MG TABS 1-2 at bedtime  Method Requested: Fax to [facility] Initial call taken by: Lelon Mast,  March 05, 2007 8:57 AM  Follow-up for Phone Call        okay #180 x 1 Follow-up by: Claris Gower MD,  March 05, 2007 11:15 AM  Additional Follow-up for Phone Call Additional follow up Details #1::        rx faxed Additional Follow-up by: Lelon Mast,  March 05, 2007 11:28 AM

## 2010-03-30 NOTE — Progress Notes (Signed)
Summary: low potassium level  Phone Note From Other Clinic   Caller: Marie at Dr. Clydell Hakim office Call For: dr Silvio Pate Summary of Call: Pt is scheduled for knee replacement surgery next week and some of his labs, including potassium are off.  He has appt to see you on 3/17.  Copies of his labs are on your desk. Initial call taken by: [name],  May 13, 2008 10:04 AM  Follow-up for Phone Call        Potassium 3.2 will address at pre-op visit today Follow-up by: Claris Gower MD,  May 14, 2008 7:58 AM

## 2010-03-30 NOTE — Assessment & Plan Note (Signed)
Summary: ?INFECTION ON LIP/CLE   Vital Signs:  Patient profile:   77 year old male Weight:      184 pounds Temp:     97.6 degrees F oral BP sitting:   156 / 80  (left arm) Cuff size:   large  Vitals Entered By: Edwin Dada CMA [facility]) (November 30, 2009 10:12 AM) CC: lip swollen   History of Present Illness: Just had left inguinal hernia Went well Still with some pain and bruising in scrotum  Lips feel swollen and infected (senstivite and painful) trying blistex without help for the past 3 days No new meds No fever  has changed from diet soda to sports drinks  Allergies: 1)  ! * Statins 2)  Lipitor 3)  Pravachol  Past History:  Past medical, surgical, family and social histories (including risk factors) reviewed for relevance to current acute and chronic problems.  Past Medical History: Reviewed history from 01/26/2009 and no changes required. Diverticulosis, colon05/2005 GERD Hyperlipidemia Hypertension Benign prostatic hypertrophy Osteoarthritis, severe, B knees Renal insufficiency  Past Surgical History: R 4th finger surgery Tonsils as child Lumbar spinal stenosis repair  06/2005 Bilateral total knee replacement, May 2010, Dr. Marry Guan Cataracts--bilateral 2011  Dr D[facility]ein [facility]---  Dr Bary Castilla   9/11  Family History: Reviewed history from 06/21/2006 and no changes required. Dad died @72  of "clogged arteries" Mom died @ 36 of old age, had HTN 1 brother died @24  of polia No prostate or colon cancer  Social History: Reviewed history from 08/12/2009 and no changes required. Retired--school principal Widowed  2010-2 adopted children Never Smoked Alcohol use-no    Review of Systems       No trouble with teeth  no swallowing problems  Physical Exam  General:  alert.  NAD Mouth:  moderate pale swelling of lower lip slight crusting No inflammation, induration or tenderness Upper lip and gingiva appear pretty normal Neck:  supple, no masses,  and no cervical lymphadenopathy.     Impression & Recommendations:  Problem # 1:  CHEILITIS (ICD-528.5) Assessment New may be response to the ET tube or some medications will just treat with Duke's mouthwash consider loratadine  Complete Medication List: 1)  Proscar 5 Mg Tabs (Finasteride) .Marland Kitchen.. 1 daily 2)  Clonazepam 0.5 Mg Tabs (Clonazepam) .Marland Kitchen.. 1-2 at bedtime 3)  Simvastatin 40 Mg Tabs (Simvastatin) .... Take 1/2 by mouth once daily 4)  Mirapex 1 Mg Tabs (Pramipexole dihydrochloride) .Marland Kitchen.. 1-2 at bedtime for restless legs 5)  Furosemide 40 Mg Tabs (Furosemide) .Marland Kitchen.. 1 tab daily  as needed for edema 6)  Zolpidem Tartrate 10 Mg Tabs (Zolpidem tartrate) .... Take 1 by mouth at bedtime as needed 7)  Doxazosin Mesylate 4 Mg Tabs (Doxazosin mesylate) .Marland Kitchen.. 1& 1/2 tab daily for enlarged prostate 8)  Potassium Chloride 20 Meq Pack (Potassium chloride) .... Take 1 by mouth once daily when taking furosemide 9)  Hydrochlorothiazide 25 Mg Tabs (Hydrochlorothiazide) .... Take 1 by mouth once daily 10)  Omeprazole 20 Mg Cpdr (Omeprazole) .... One by mouth daily as needed for acid reflux symptoms 11)  Multivitamins Caps (Multiple vitamin) .Marland Kitchen.. 1 daily 12)  Fish Oil Caps (Omega-3 fatty acids caps) .... Takes otc 78)  Zinc-c-b6 15-60-5 Mg Lozg (Zinc-c-b6) .... Take 1 by mouth once daily 14)  First-dukes Mouthwash Susp (Diphenhyd-hydrocort-nystatin) .... 5cc swish and swallow every 2 hours as needed for soreness in mouth and lips  Patient Instructions: 1)  Please keep regular follow up appt 2)  Please schedule  a follow-up appointment as needed .  Prescriptions: FIRST-DUKES MOUTHWASH  SUSP (DIPHENHYD-HYDROCORT-NYSTATIN) 5cc swish and swallow every 2 hours as needed for soreness in mouth and lips  #8oz x 1   Entered and Authorized by:   Claris Gower MD   Signed by:   Claris Gower MD on 11/30/2009   Method used:   Electronically to        [facility]* (retail)       [place], [place]  [address]       Ph: [phone]       Fax: [phone]   RxID:   [id number]   Current Allergies (reviewed today): ! * STATINS LIPITOR PRAVACHOL

## 2010-03-30 NOTE — Progress Notes (Signed)
Summary: note is on your desk  Phone Note Call from Patient Call back at Home Phone [phone]   Caller: Patient Call For: Claris Gower MD Summary of Call: Pt wants to know if his heart is strong enough for "strenuous sexual activity".  Also asking if ok to take somthing to help him sleep.  I put the note on your desk because I couldnt make out what medicine he is requesting. Initial call taken by: [name] CMA,  Jul 14, 2009 10:33 AM  Follow-up for Phone Call        Please let him know that as far as I know his heart is fairly healthy. He should not jump from no sex to "strenuous sex" without building up his stamina gradually. His tennis playing should be a guide---don't exert at first more than he does on the tennis court  Also asks about using dimenhydramine 50mg  1-2 at bedtime to help sleep. This medicine is fairly safe but can cause morning sedation and occ mental fogginess at times--esp in older people. I would recommend no more than one a night and watch carefully for any side effects Follow-up by: Claris Gower MD,  Jul 14, 2009 1:56 PM  Additional Follow-up for Phone Call Additional follow up Details #1::        left message on machine at home for patient to return my call.  DeShannon Smith CMA Deborra Medina)  Jul 15, 2009 8:25 AM   Patient called back and I advised results. Patient understands Additional Follow-up by: Edwin Dada CMA [facility]),  Jul 15, 2009 11:22 AM

## 2010-03-30 NOTE — Assessment & Plan Note (Signed)
Summary: roa/bir   Vital Signs:  Patient Profile:   77 Years Old Male Height:     67.4 inches (171.2 cm) Weight:      179 pounds Temp:     97.0 degrees F oral Pulse rate:   64 / minute Pulse rhythm:   regular BP sitting:   170 / 96  (left arm) Cuff size:   regular  Vitals Entered By: Emelia Salisbury (November 28, 2007 10:24 AM)                 Chief Complaint:  6 month followup and flu shot given today.  History of Present Illness: doing okay Does feel like there may be fluid in legs Sprained left ankle 2 weeks ago--x-rays negative at [facility] at night when he got up and was standing and then fell asleep Not really syncopal  eats little salt No DOE, no easier fatigue sleeps flat in bed, no PND stable 2-3 per night nocturia  still with knee pain can play tennis once a week still on voltaren gel and glucosamine has vicodin for tennis games no problems with sleep    Current Allergies (reviewed today): ! * STATINS LIPITOR PRAVACHOL  Past Medical History:    Reviewed history from 12/12/2006 and no changes required:       Diverticulosis, colon05/2005       GERD       Hyperlipidemia       Hypertension       Benign prostatic hypertrophy       Osteoarthritis  Past Surgical History:    Reviewed history from 05/25/2007 and no changes required:       R 4th finger surgery       Tonsils as child       Lumbar spinal stenosis repair  06/2005   Social History:    Reviewed history from 06/21/2006 and no changes required:       Retired--school principal       Married--2 adopted children       Never Smoked       Alcohol use-no       Wife has Stage 4 bronchoalveolar cancer    Review of Systems  The patient denies chest pain, dyspnea on exertion, abdominal pain, and depression.         Wife is still doing well with her cancer--does get dyspneic at times weight down about 2#   Physical Exam  General:     alert and normal appearance.   Neck:     supple, no  masses, no thyromegaly, no carotid bruits, and no cervical lymphadenopathy.   Lungs:     normal respiratory effort, normal breath sounds, and no dullness.   Heart:     normal rate, regular rhythm, no murmur, and no gallop.   Abdomen:     soft, non-tender, and no masses.   Msk:     left ankle swelling  mild tenderness Extremities:     1-2+ edema on left and 1+ on right ankle only some pitting Neurologic:     alert & oriented X3 and strength normal in all extremities.   Skin:     no suspicious lesions and no ulcerations.   Psych:     normally interactive, good eye contact, not anxious appearing, and not depressed appearing.      Impression & Recommendations:  Problem # 1:  PAIN IN JOINT, ANKLE AND FOOT (ICD-719.47) Assessment: New still with pain discussed--he needs to wear the  compression brace till pain gone  Problem # 2:  EDEMA- LOCALIZED (ICD-782.3) Assessment: New mild no signs of CHF will check labs probably due to ankle sprain and not as active discussed elevation  His updated medication list for this problem includes:    Hydrochlorothiazide 25 Mg Tabs (Hydrochlorothiazide) .Marland Kitchen... 1 daily  Orders: Venipuncture [id number]) TLB-Renal Function Panel (80069-RENAL) TLB-Hepatic/Liver Function Pnl (80076-HEPATIC) TLB-CBC Platelet - w/Differential (85025-CBCD)   Problem # 3:  HYPERTENSION (ICD-401.9) Assessment: Deteriorated slightly up from baseline I got 158/100 no NSAIDs no change for now since has been fine  His updated medication list for this problem includes:    Cardura 8 Mg Tabs (Doxazosin mesylate) .Marland Kitchen... 1 daily    Hydrochlorothiazide 25 Mg Tabs (Hydrochlorothiazide) .Marland Kitchen... 1 daily    Lisinopril 40 Mg Tabs (Lisinopril) .Marland Kitchen... Take 1/2 tablet by mouth once daily  BP today: 170/96 Prior BP: 134/78 (10/01/2007)  Labs Reviewed: Creat: 1.4 (07/24/2007) Chol: 147 (07/24/2007)   HDL: 29.1 (07/24/2007)   LDL: 101 (07/24/2007)   TG: 87 (07/24/2007)   Problem #  4:  HYPERLIPIDEMIA (ICD-272.4) Assessment: Unchanged no changes needed  His updated medication list for this problem includes:    Simvastatin 40 Mg Tabs (Simvastatin) .Marland Kitchen... Take as directed  Labs Reviewed: Chol: 147 (07/24/2007)   HDL: 29.1 (07/24/2007)   LDL: 101 (07/24/2007)   TG: 87 (07/24/2007) SGOT: 32 (07/24/2007)   SGPT: 33 (07/24/2007)   Complete Medication List: 1)  Proscar 5 Mg Tabs (Finasteride) .Marland Kitchen.. 1 daily 2)  Cardura 8 Mg Tabs (Doxazosin mesylate) .Marland Kitchen.. 1 daily 3)  Hydrochlorothiazide 25 Mg Tabs (Hydrochlorothiazide) .Marland Kitchen.. 1 daily 4)  Multivitamins Caps (Multiple vitamin) .Marland Kitchen.. 1 daily 5)  Fish Oil Caps (Omega-3 fatty acids caps) .... Takes otc 6)  Clonazepam 0.5 Mg Tabs (Clonazepam) .Marland Kitchen.. 1-2 at bedtime 7)  Simvastatin 40 Mg Tabs (Simvastatin) .... Take as directed 8)  Lisinopril 40 Mg Tabs (Lisinopril) .... Take 1/2 tablet by mouth once daily 9)  Sulindac 200 Mg Tabs (Sulindac) .Marland Kitchen.. 1 two times a day as needed for arthritis pain 10)  Viagra 100 Mg Tabs (Sildenafil citrate) .... Take 1/2-1 tablet by mouth once a day 11)  Fibrer Con  .... 2 daily 12)  Vicodin 5-500 Mg Tabs (Hydrocodone-acetaminophen) .... 1/2--1 tab three times a day as needed for severe arthritis pain 13)  Omeprazole 20 Mg Cpdr (Omeprazole) .... One by mouth 1/2 hour prior to breakfast. 14)  Voltaren 1 % Gel (Diclofenac sodium) .... Apply a small to affected area qid. 15)  Mirapex 1 Mg Tabs (Pramipexole dihydrochloride) .Marland Kitchen.. 1-2 at bedtime for restless legs 16)  Glucosamine Sulfate 1000 Mg Caps (Glucosamine sulfate) .... 2 tablets per day by mouth  Other Orders: Flu Vaccine 5yrs + [id number]) Administration Flu vaccine [id number])   Patient Instructions: 1)  Please schedule a follow-up appointment in 6 months. 2)  call to be seen sooner if your blood pressure stays up   ] Current Allergies (reviewed today): ! * STATINS LIPITOR PRAVACHOL Flu Vaccine Consent Questions     Do you have a history of severe  allergic reactions to this vaccine? no    Any prior history of allergic reactions to egg and/or gelatin? no    Do you have a sensitivity to the preservative Thimersol? no    Do you have a past history of Guillan-Barre Syndrome? no    Do you currently have an acute febrile illness? no    Have you ever had a severe reaction to latex? no  Vaccine information given and explained to patient? yes    Are you currently pregnant? no    Lot Number:[id number]   Site Given  Left Deltoid IM

## 2010-03-30 NOTE — Progress Notes (Signed)
Summary: Rx for hernia belt  Phone Note Call from Patient   Caller: Patient Call For: Dr Glori Bickers Summary of Call: Pt needs rx for hernia belt. Pt request rx be faxed to [facility]. [phone] fax and phone # if needed [phone]. Pt wants call back when rx is faxed [phone]. Initial call taken by: Ozzie Hoyle LPN,  July 27, 624THL 624THL AM  Follow-up for Phone Call        will print out px for fax  Follow-up by: Allena Earing MD,  September 23, 2009 10:06 AM  Additional Follow-up for Phone Call Additional follow up Details #1::        rx faxed to [facility] Additional Follow-up by: Lacretia Nicks,  September 23, 2009 10:15 AM

## 2010-03-30 NOTE — Progress Notes (Signed)
Summary: shingles shot rx  Phone Note Call from Patient Call back at [phone]   Caller: Patient Call For: Claris Gower MD Summary of Call: Patient called and said that he needs rx sent to [facility] on [address] for shingles shot. Says that orginization will pay for most all he will have to pay out of pocket is $47.00. Initial call taken by: Lacretia Nicks,  February 09, 2009 4:14 PM  Follow-up for Phone Call        rx given Follow-up by: Claris Gower MD,  February 09, 2009 4:53 PM  Additional Follow-up for Phone Call Additional follow up Details #1::        Spoke with patient and advised results. , rx called in Additional Follow-up by: Edwin Dada CMA ([place]),  February 09, 2009 4:55 PM    New/Updated Medications: ZOSTAVAX 53664 UNT/0.65ML SOLR (ZOSTER VACCINE LIVE) inject 0.5 cc for shingles vaccine Prescriptions: ZOSTAVAX 40347 UNT/0.65ML SOLR (ZOSTER VACCINE LIVE) inject 0.5 cc for shingles vaccine  #1 x 0   Entered by:   Edwin Dada CMA ([place])   Authorized by:   Claris Gower MD   Signed by:   Edwin Dada CMA ([place]) on 02/09/2009   Method used:   Electronically to        [facility]. [address] [phone]* (retail)       [address] [place]  [address]       Ph: [phone]       Fax: [phone]   RxID:   [id number]

## 2010-03-30 NOTE — Assessment & Plan Note (Signed)
Summary: SURGICAL CLEARANCE/CLE   Vital Signs:  Patient profile:   77 year old male Height:      67 inches Weight:      185 pounds O2 Sat:      98 % on Room air Temp:     97.8 degrees F oral Pulse rate:   62 / minute Pulse rhythm:   regular Resp:     18 per minute BP sitting:   146 / 80  (left arm) Cuff size:   large  Vitals Entered By: Edwin Dada CMA Deborra Medina) (November 18, 2009 12:23 PM)  O2 Flow:  Room air CC: surgical clearance   History of Present Illness: Due for repair of inguinal hernia  concern due to abnormal EKG on preop testing  No chest pain Still playing tennis--singles and doubles runs a fair bit No change in exercise tolerance No SOB No palpitaitons  Mild edema in calves and ankles--gets some tingling uses the furosemide 1-2 times per week Uses one pillow at night--no PND   Allergies: 1)  ! * Statins 2)  Lipitor 3)  Pravachol  Review of Systems       Does notice frequent awakenings at night--goes back for years uses ambien as needed  doesn't feel stress "reevaluating the things I like to do" Has gotten good effect from OTC diphenhydrinate  Physical Exam  General:  alert and healthy-appearing.   Neck:  supple, no masses, no thyromegaly, no carotid bruits, and no cervical lymphadenopathy.   Lungs:  normal respiratory effort, no intercostal retractions, no accessory muscle use, and normal breath sounds.   Heart:  normal rate, regular rhythm, no murmur, and no gallop.   Abdomen:  soft, non-tender, and no masses.   Extremities:  trace ankle edema Skin:  actinic on right posterior neck  Psych:  normally interactive, good eye contact, not anxious appearing, and not depressed appearing.     Impression & Recommendations:  Problem # 1:  ABNORMAL ELECTROCARDIOGRAM (ICD-794.31) Assessment New  due for inguinal hernia repair Repeat EKG here is benign No evidence of ischemic heart disease--still runs playing tennis no contraindications to  proceeding with hernia repair---medically clear  Orders: EKG w/ Interpretation (93000)  Problem # 2:  ACTINIC KERATOSIS (ICD-702.0) Assessment: New  liquid nitrogen 40 seconds x 2 tolerated well discussed home care  Orders: Cryotherapy/Destruction benign or premalignant lesion (1st lesion)  (17000)  Complete Medication List: 1)  Proscar 5 Mg Tabs (Finasteride) .Marland Kitchen.. 1 daily 2)  Clonazepam 0.5 Mg Tabs (Clonazepam) .Marland Kitchen.. 1-2 at bedtime 3)  Simvastatin 40 Mg Tabs (Simvastatin) .... Take 1/2 by mouth once daily 4)  Mirapex 1 Mg Tabs (Pramipexole dihydrochloride) .Marland Kitchen.. 1-2 at bedtime for restless legs 5)  Furosemide 40 Mg Tabs (Furosemide) .Marland Kitchen.. 1 tab daily  as needed for edema 6)  Zolpidem Tartrate 10 Mg Tabs (Zolpidem tartrate) .... Take 1 by mouth at bedtime as needed 7)  Doxazosin Mesylate 4 Mg Tabs (Doxazosin mesylate) .Marland Kitchen.. 1& 1/2 tab daily for enlarged prostate 8)  Potassium Chloride 20 Meq Pack (Potassium chloride) .... Take 1 by mouth once daily when taking furosemide 9)  Hydrochlorothiazide 25 Mg Tabs (Hydrochlorothiazide) .... Take 1 by mouth once daily 10)  Omeprazole 20 Mg Cpdr (Omeprazole) .... One by mouth daily as needed for acid reflux symptoms 11)  Multivitamins Caps (Multiple vitamin) .Marland Kitchen.. 1 daily 12)  Fish Oil Caps (Omega-3 fatty acids caps) .... Takes otc 45)  Zinc-c-b6 15-60-5 Mg Lozg (Zinc-c-b6) .... Take 1 by mouth once daily  Patient Instructions: 1)  Please cancel upcoming appt and reschedule for 4 months from now  Current Allergies (reviewed today): ! * STATINS LIPITOR PRAVACHOL

## 2010-03-30 NOTE — Letter (Signed)
Summary: [facility] Anesthesia Request for Clearance Form  [facility] Anesthesia Request for Clearance Form   Imported By: Virgia Land 05/14/2008 13:39:45  _____________________________________________________________________  External Attachment:    Type:   Image     Comment:   External Document

## 2010-03-30 NOTE — Progress Notes (Signed)
Summary: Rx-Clonazepam  Phone Note Refill Request Message from:  Fax from Pharmacy on September 03, 2007 1:15 PM  Refills Requested: Medication #1:  CLONAZEPAM 0.5 MG TABS 1-2 at bedtime   Last Refilled: 04/052009 #180 [facility] [id number]  Initial call taken by: Jasmine December,  September 03, 2007 1:16 PM  Follow-up for Phone Call        okay #180 x 0 Follow-up by: Claris Gower MD,  September 03, 2007 1:25 PM  Additional Follow-up for Phone Call Additional follow up Details #1::        faxed to pharmacy Additional Follow-up by: Jasmine December,  September 03, 2007 4:41 PM      Prescriptions: CLONAZEPAM 0.5 MG TABS (CLONAZEPAM) 1-2 at bedtime  #180 x 0   Entered by:   Jasmine December   Authorized by:   Claris Gower MD   Signed by:   Jasmine December on 09/03/2007   Method used:   Telephoned to ...       [facility]. # [id number]*       [address]       [place], [place]  [address]       Ph: [phone]       Fax: [phone]   RxID:   [id number]  faxed

## 2010-03-30 NOTE — Op Note (Signed)
Summary: Left Inguinal Hernia Repair/Dr Hervey Ard  Left Inguinal Hernia Repair/Dr Hervey Ard   Imported By: Laural Benes 11/27/2009 13:07:53  _____________________________________________________________________  External Attachment:    Type:   Image     Comment:   External Document

## 2010-03-30 NOTE — Letter (Signed)
Summary: [facility]  [facility]   Imported By: Edmonia James 11/24/2009 08:33:22  _____________________________________________________________________  External Attachment:    Type:   Image     Comment:   External Document  Appended Document: [facility] Planning hernia repair

## 2010-03-30 NOTE — Progress Notes (Signed)
Summary: refill request for ambien  Phone Note Refill Request Message from:  Fax from Pharmacy  Refills Requested: Medication #1:  ZOLPIDEM TARTRATE 10 MG TABS take 1 by mouth at bedtime as needed   Last Refilled: 10/06/2008 Faxed request from [facility] is on your desk.  Initial call taken by: [name] CMA,  September 08, 2009 9:07 AM  Follow-up for Phone Call        okay #30 x 0 Follow-up by: Claris Gower MD,  September 08, 2009 12:27 PM  Additional Follow-up for Phone Call Additional follow up Details #1::        Rx faxed to pharmacy Additional Follow-up by: DeShannon Smith CMA Deborra Medina),  September 08, 2009 1:10 PM    Prescriptions: ZOLPIDEM TARTRATE 10 MG TABS (ZOLPIDEM TARTRATE) take 1 by mouth at bedtime as needed  #30 x 0   Entered by:   Edwin Dada CMA ([place])   Authorized by:   Claris Gower MD   Signed by:   Edwin Dada CMA ([place]) on 09/08/2009   Method used:   Handwritten   RxIDUZ:[id number]

## 2010-03-30 NOTE — Letter (Signed)
Summary: [facility]  [facility]   Imported By: Laural Benes 10/09/2008 15:51:21  _____________________________________________________________________  External Attachment:    Type:   Image     Comment:   External Document  Appended Document: [facility] no arterial or venous insufficiency found

## 2010-03-30 NOTE — Consult Note (Signed)
Summary: [facility]  [facility]   Imported By: Edmonia James 09/22/2008 08:01:17  _____________________________________________________________________  External Attachment:    Type:   Image     Comment:   External Document  Appended Document: [facility] seen for edema checking duplex

## 2010-03-30 NOTE — Assessment & Plan Note (Signed)
Summary: 8:15 ROA 4 MTHS  CYD   Vital Signs:  Patient profile:   77 year old male Weight:      169 pounds Temp:     98.3 degrees F oral Pulse rate:   74 / minute Pulse rhythm:   regular BP sitting:   140 / 80  (left arm) Cuff size:   regular  Vitals Entered By: Edwin Dada CMA (September 17, 2008 8:14 AM)  History of Present Illness: Chief Complaint: follow-up  Having trouble with edema Saw vascular surgeon--Dr Hearn's PA Probably venous insuff but could be lymphedema On lasix but still puffed up Keeps legs elevated but still has pain Can't get support hose on Has testing schedule 8/4  Knee repair is progressing No direct problems but not improving due to the edema  Evaluated but Dr Amedeo Plenty for hand pain Felt to be related to needing to use canes post-op  No urinary symptoms of note very slight burning No fever No hematuria Has chronic frequency--no change Nocturia stable at every  2-3 hours  Wants to go back on sulindac asks about celebrex--never took. Discussed my concerns Wants to go back on glucosamine/chondroitin also--this is okay  Has tickle cough now bad cough goes back to after surgery better but still persisting as tickle No change in voice has been on lisinopril for some time  Allergies: 1)  ! * Statins 2)  Lipitor 3)  Pravachol  Past History:  Past medical, surgical, family and social histories (including risk factors) reviewed for relevance to current acute and chronic problems.  Past Medical History: Reviewed history from 02/04/2008 and no changes required. Diverticulosis, colon05/2005 GERD Hyperlipidemia Hypertension Benign prostatic hypertrophy Osteoarthritis, severe, B knees  Past Surgical History: Reviewed history from 09/03/2008 and no changes required. R 4th finger surgery Tonsils as child Lumbar spinal stenosis repair  06/2005 Bilateral total knee replacement, May 2010, Dr. Marry Guan  Family History: Reviewed history from  06/21/2006 and no changes required. Dad died @72  of "clogged arteries" Mom died @ 56 of old age, had HTN 1 brother died @24  of polia No prostate or colon cancer  Social History: Reviewed history from 09/03/2008 and no changes required. Retired--school principal Married--2 adopted children Never Smoked Alcohol use-no Wife had Stage 4 bronchoalveolar cancer - she died Spring 2010  Review of Systems       weight is stable now appetite is good sleeps okay working through grieving process with wife---now really worried about his edema  Physical Exam  General:  alert and normal appearance.   Mouth:  pharynx pink and moist, no erythema, no exudates, and no lesions.   Neck:  supple, no masses, no thyromegaly, no carotid bruits, and no cervical lymphadenopathy.   Lungs:  normal respiratory effort, no intercostal retractions, no accessory muscle use, and normal breath sounds.   Heart:  normal rate, regular rhythm, and no gallop.   Soft aortic systolic murmur Msk:  incisions over knees are well healed Pulses:  1+ on right , trace on left Extremities:  2+ edema in feet decreasing to 1+ in distal calves Neurologic:  some trouble getting on and off table but no focal weakness Psych:  normally interactive, good eye contact, not anxious appearing, and dysphoric affect.  Some tears discussing wife   Impression & Recommendations:  Problem # 1:  EDEMA- LOCALIZED (ICD-782.3) Assessment Unchanged  no better will increase furosemide when edema better, hopefully can tolerate support hose  The following medications were removed from the medication list:  Furosemide 20 Mg Tabs (Furosemide) .Marland Kitchen... 1 by mouth daily every morning His updated medication list for this problem includes:    Hydrochlorothiazide 25 Mg Tabs (Hydrochlorothiazide) .Marland Kitchen... 1 daily    Furosemide 40 Mg Tabs (Furosemide) .Marland Kitchen... 1 tab daily for edema  Orders: TLB-Renal Function Panel (80069-RENAL) TLB-CBC Platelet -  w/Differential (85025-CBCD) Venipuncture [id number])  Problem # 2:  COUGH (ICD-786.2) Assessment: New  sounds like ACEI cough but tolerated lisinopril in past improving so we will just observe for now consider change to losartan  Orders: Venipuncture [id number])  Problem # 3:  GRIEF REACTION, ACUTE (ICD-309.0) Assessment: Improved  better but still grieving may be reason for his increased concern about the edema (really scared by it)  Orders: Venipuncture [id number])  Problem # 4:  BENIGN PROSTATIC HYPERTROPHY (ICD-600.00) Assessment: Unchanged  voiding about the same on meds UTI seems resolved  Orders: Venipuncture [id number])  Complete Medication List: 1)  Proscar 5 Mg Tabs (Finasteride) .Marland Kitchen.. 1 daily 2)  Hydrochlorothiazide 25 Mg Tabs (Hydrochlorothiazide) .Marland Kitchen.. 1 daily 3)  Multivitamins Caps (Multiple vitamin) .Marland Kitchen.. 1 daily 4)  Fish Oil Caps (Omega-3 fatty acids caps) .... Takes otc 5)  Clonazepam 0.5 Mg Tabs (Clonazepam) .Marland Kitchen.. 1-2 at bedtime 6)  Simvastatin 40 Mg Tabs (Simvastatin) .... Take as directed 7)  Lisinopril 40 Mg Tabs (Lisinopril) .... Take 1/2 tablet by mouth once daily 8)  Omeprazole 20 Mg Cpdr (Omeprazole) .... One by mouth 1/2 hour prior to breakfast. 9)  Mirapex 1 Mg Tabs (Pramipexole dihydrochloride) .Marland Kitchen.. 1-2 at bedtime for restless legs 10)  Oxycodone Hcl 5 Mg Tabs (Oxycodone hcl) .Marland Kitchen.. 1 tab three times a day as needed for severe pain 11)  Iron 325 (65 Fe) Mg Tabs (Ferrous sulfate) .... Take 1 by mouth once daily 12)  B Complex Vitamins Caps (B complex vitamins) .... Take 1 by mouth once daily 13)  Cardura 2 Mg Tabs (Doxazosin mesylate) .... Take 1 by mouth once daily 14)  Furosemide 40 Mg Tabs (Furosemide) .Marland Kitchen.. 1 tab daily for edema  Patient Instructions: 1)  Please schedule a follow-up appointment in 2-3  weeks.  Prescriptions: FUROSEMIDE 40 MG TABS (FUROSEMIDE) 1 tab daily for edema  #90 x 3   Entered and Authorized by:   Claris Gower MD   Signed by:    Claris Gower MD on 09/17/2008   Method used:   Electronically to        [facility]* (retail)       [place], [place]  [address]       Ph: [phone]       Fax: [phone]   RxID:   [id number]   Current Allergies (reviewed today): ! * STATINS LIPITOR PRAVACHOL

## 2010-03-30 NOTE — Assessment & Plan Note (Signed)
Summary: water on leg/dlo   Vital Signs:  Patient Profile:   77 Years Old Male Height:     67.4 inches (171.20 cm) Weight:      184.38 pounds (83.81 kg) Temp:     97.5 degrees F (36.39 degrees C) oral Pulse rate:   60 / minute Pulse rhythm:   regular BP sitting:   158 / 80  (left arm) Cuff size:   regular  Vitals Entered By: Jasmine December (January 22, 2008 10:19 AM)                 Chief Complaint:  water on legs, twisted knee yesterday, and no cartliage in bothe knees.  History of Present Illness: 77 year old male presents:  h/o spinal stenosis  1. Twisted left knee some yesterday and hurting.  Right knee in spasm.  Twisted his left knee, it has been hurting a great deal since that time. He does feel as if it is occasionally going to give way. He did not hear a pop. It did not swell up immediately, though he does have a mild effusion currently. He has seen Dr. Marry Guan in the past and Dr. Ricki Rodriguez, and both have given him the recommendation for total knee replacement. I do not have any films available today for review. He is able to walk, though he does walk with a limp currently. There is no radiating pain.  2. le e pimary concern: the patient's primary concern in this visit is his bilateral lower extremity edema. He has no history of any known congestive heart failure, he has no history of hepatic failure, hepatic dysfunction, kidney failure or any known inciting cause. This is been a slow accumulating problem over the last year.  Hgb: 13.9 (11/28/2007 10:57:00 AM)Hct: 40.3 (11/28/2007 10:57:00 AM)Hemoccult: H. Pylori: Amylase: LDH: SGOT: 35 (11/28/2007 10:57:00 AM)SGPT: 41 (11/28/2007 10:57:00 AM)T.Bili: 1.1 (11/28/2007 10:57:00 AM)AlkPhos: 105 (11/28/2007 10:57:00 AM)Colonoscopy:    Basic Metabolic Panel: reviewed last resultsNa:        Cl:        K+:        HCO3:  BUN:  32 (11/28/2007 10:57:00 AM)   BS:      96 (11/28/2007 10:57:00 AM)Cr:      1.4 (11/28/2007 10:57:00 AM)    FBS:         Current Allergies: ! * STATINS LIPITOR PRAVACHOL  Past Medical History:    Reviewed history from 12/12/2006 and no changes required:       Diverticulosis, colon05/2005       GERD       Hyperlipidemia       Hypertension       Benign prostatic hypertrophy       Osteoarthritis  Past Surgical History:    Reviewed history from 05/25/2007 and no changes required:       R 4th finger surgery       Tonsils as child       Lumbar spinal stenosis repair  06/2005     Review of Systems       REVIEW OF SYSTEMS  GEN: No systemic complaints, no fevers, chills, sweats, or other acute illnesses  MSK: Detailed in the HPI GI: tolerating PO intake without difficulty  Neuro: No numbness, parasthesias, or tingling associated.  Otherwise the pertinent positives of the ROS are noted above. Further ROS may be demarcated in the ROS section of Centricity, If none, then this plus the HPI constitutes the ROS.  Physical Exam  General:     Well-developed,well-nourished,in no acute distress; alert,appropriate and cooperative throughout examination Head:     normocephalic and atraumatic.   Ears:     no external deformities.   Nose:     no external deformity.   Lungs:     Normal respiratory effort, chest expands symmetrically. Lungs are clear to auscultation, no crackles or wheezes. Heart:     Normal rate and regular rhythm. S1 and S2 normal without gallop, murmur, click, rub or other extra sounds. Abdomen:     Bowel sounds positive,abdomen soft and non-tender without masses, organomegaly or hernias noted. Msk:     right knee: Loss of extension by 5, flexion to 110. There is some crepitus with motion. There is no ballotable effusion. Stable varus and valgus stress. Negative Lachman. Negative anterior posterior drawer test. Negative Murray's test.  Left knee: Minimal effusion. Loss of extension by 5, flexion to approximately 95-100. There is pain with motion. No ballotable  effusion. Stable varus without distress. The patient is tender along the medial joint line. Nontender lateral joint line. Nontender along patellar tendon. Nontender in the has anserine this region. No posterior effusion. McMurray is equivocal, given that the patient has pain that makes this test and possible. Impossible to do flexion pinch. Impressive crepitus with motion.  Further special testing of the knee limited secondary to pain. Pulses:     DP and PT 2+ Extremities:     2+ B LE edema    Impression & Recommendations:  Problem # 1:  EDEMA- LOCALIZED (ICD-782.3) Assessment: New Renal and Liver function stable. Check echo to eval for potential CHF.  His updated medication list for this problem includes:    Hydrochlorothiazide 25 Mg Tabs (Hydrochlorothiazide) .Marland Kitchen... 1 daily  Orders: Cardiology Referral (Cardiology) No RX's Sent Electronically/Printed [phone])   Problem # 2:  INTERNAL DERANGEMENT, KNEE ([id number]) Assessment: New clinically, think that the patient either has a severe exacerbation of osteoarthritis, given his degree of disease, and he may have a degenerative meniscal tear.  A recommended an intra-articular corticosteroid injection, however the patient declined. He is going to ice his knee aggressively.  If doctors Hooten and Alucio both recommend knee replacements, then I recommended that he follow-up when he can for surgery. He cannot now, since he takes care of his sick wife. Orders: No RX's Sent Electronically/Printed [id number])   Complete Medication List: 1)  Proscar 5 Mg Tabs (Finasteride) .Marland Kitchen.. 1 daily 2)  Cardura 8 Mg Tabs (Doxazosin mesylate) .Marland Kitchen.. 1 daily 3)  Hydrochlorothiazide 25 Mg Tabs (Hydrochlorothiazide) .Marland Kitchen.. 1 daily 4)  Multivitamins Caps (Multiple vitamin) .Marland Kitchen.. 1 daily 5)  Fish Oil Caps (Omega-3 fatty acids caps) .... Takes otc 6)  Clonazepam 0.5 Mg Tabs (Clonazepam) .Marland Kitchen.. 1-2 at bedtime 7)  Simvastatin 40 Mg Tabs (Simvastatin) .... Take as  directed 8)  Lisinopril 40 Mg Tabs (Lisinopril) .... Take 1/2 tablet by mouth once daily 9)  Sulindac 200 Mg Tabs (Sulindac) .Marland Kitchen.. 1 two times a day as needed for arthritis pain 10)  Viagra 100 Mg Tabs (Sildenafil citrate) .... Take 1/2-1 tablet by mouth once a day 11)  Fibrer Con  .... 2 daily 12)  Vicodin 5-500 Mg Tabs (Hydrocodone-acetaminophen) .... 1/2--1 tab three times a day as needed for severe arthritis pain 13)  Omeprazole 20 Mg Cpdr (Omeprazole) .... One by mouth 1/2 hour prior to breakfast. 14)  Voltaren 1 % Gel (Diclofenac sodium) .... Apply a small to affected area qid. 15)  Mirapex  1 Mg Tabs (Pramipexole dihydrochloride) .Marland Kitchen.. 1-2 at bedtime for restless legs 16)  Glucosamine Sulfate 1000 Mg Caps (Glucosamine sulfate) .... 2 tablets per day by mouth  Other Orders: Zoster (Shingles) Vaccine Live [id number]) Admin 1st Vaccine [phone])   Patient Instructions: 1)  Referral Appointment Information 2)  Day/Date: 3)  Time: 4)  Place/MD: 5)  Address: 6)  Phone/Fax: 7)  Patient given appointment information. Information/Orders faxed/mailed.    ]   Zostavax # 1    Vaccine Type: Zostavax    Site: right deltoid    Mfr: Merck    Dose: 0.5 ml    Route: [place]    Given by: Jasmine December    Exp. Date: 01/20/2009    Lot #: [id number]    VIS given: 12/10/04 given January 22, 2008.

## 2010-03-30 NOTE — Progress Notes (Signed)
Summary: regarding furosemide  Phone Note Call from Patient   Caller: Patient Call For: Claris Gower MD Summary of Call: Pt is taking lasix for edema in his leg.  He is asking what is the maximum number of times per week that he can safely take this, over a period of time.  He says he doesnt need to take this everyday but wants to take it because it prevents the fluid from building up in his legs.  Please leave a message if he doesnt answer his phone. Initial call taken by: [name] CMA, AAMA,  January 04, 2010 8:45 AM  Follow-up for Phone Call        It would be okay if he needed to take it every day. If he can get by with just several times per week, that is fine also (and in general I would advise him not to take it on days when his legs are not swollen at all in the morning) Claris Gower MD  January 04, 2010 11:12 AM   Spoke with patient and advised results.  Follow-up by: Edwin Dada CMA Deborra Medina),  January 04, 2010 3:03 PM

## 2010-03-30 NOTE — Assessment & Plan Note (Signed)
Summary: NEEDS LABS/HEA   Vital Signs:  Patient Profile:   77 Years Old Male Height:     67.4 inches (171.2 cm) Weight:      181.38 pounds Temp:     96.9 degrees F oral Pulse rate:   68 / minute BP sitting:   164 / 88  (left arm)  Vitals Entered By: Lelon Mast (December 12, 2006 11:04 AM)                 Chief Complaint:  needs labs and knee pain.  History of Present Illness: Doing okay Wife has needed hospitalization 3 times lately worried that he may be passing something to her  Knees are really bothering him can't get knees replaced while wife is sick Sulindac helps but is not adequate Hurts just to walk but still tries to play tennis Advised him to use his braces when playing tennis No pain when sitting or in bed    Current Allergies (reviewed today): ! * STATINS LIPITOR PRAVACHOL  Past Medical History:    Reviewed history from 06/21/2006 and no changes required:       Diverticulosis, colon05/2005       GERD       Hyperlipidemia       Hypertension       Benign prostatic hypertrophy       Osteoarthritis  Past Surgical History:    Reviewed history from 06/21/2006 and no changes required:       R 4TH FINGER INFECTION-SURGERY       [place] SPINAL STENOSIS 06/2005   Social History:    Reviewed history from 06/21/2006 and no changes required:       Retired--school principal       Married--2 adopted children       Never Smoked       Alcohol use-no       Wife has Stage 4 bronchoalveolar cancer    Review of Systems  The patient denies chest pain, syncope, dyspnea on exhertion, abdominal pain, melena, and hematochezia.         sleeps okay in general Some depression with wife's cancer--but has learned to handle it Much more religious now--recently baptized as [employer] Occ Tums once weekly or so for mild heartburn   Physical Exam  General:     alert and normal appearance.   Neck:     supple, no masses, no thyromegaly, no  carotid bruits, and no cervical lymphadenopathy.   Lungs:     normal respiratory effort and normal breath sounds.   Heart:     normal rate, regular rhythm, no murmur, and no gallop.   Extremities:     no edema Psych:     normally interactive, good eye contact, and not anxious appearing.   Is somewhat upset when speaking about wife but generally mood is neutral    Impression & Recommendations:  Problem # 1:  ANXIETY, SITUATIONAL (ICD-308.3) Assessment: Comment Only seems to be dealing with wife's cancer fairly well  Problem # 2:  OSTEOARTHRITIS (ICD-715.90) Assessment: Deteriorated some help from sulindac will add vicodin for as needed   His updated medication list for this problem includes:    Sulindac 200 Mg Tabs (Sulindac) .Marland Kitchen... 1 two times a day as needed for arthritis pain    Vicodin 5-500 Mg Tabs (Hydrocodone-acetaminophen) .Marland Kitchen... 1/2--1 tab three times a day as needed for severe arthritis pain   Problem # 3:  RESTLESS LEG SYNDROME (ICD-333.94) Assessment: Unchanged klonopin helps  Problem # 4:  HYPERTENSION (ICD-401.9) Assessment: Unchanged up somewhat, no changes now with his stress  His updated medication list for this problem includes:    Cardura 8 Mg Tabs (Doxazosin mesylate) .Marland Kitchen... 1 daily    Hydrochlorothiazide 25 Mg Tabs (Hydrochlorothiazide) .Marland Kitchen... 1 daily    Lisinopril 20 Mg Tabs (Lisinopril) .Marland Kitchen... 1 1/2 daily (30mg )  Orders: TLB-Renal Function Panel (80069-RENAL) TLB-CBC Platelet - w/Differential (85025-CBCD) TLB-TSH (Thyroid Stimulating Hormone) (84443-TSH)   Problem # 5:  HYPERLIPIDEMIA (ICD-272.4) Assessment: Unchanged will check labs  His updated medication list for this problem includes:    Simvastatin 20 Mg Tabs (Simvastatin) .Marland Kitchen... 1 at bedtime  Orders: Venipuncture [id number]) TLB-Lipid Panel (80061-LIPID) TLB-Hepatic/Liver Function Pnl (80076-HEPATIC)   Complete Medication List: 1)  Proscar 5 Mg Tabs (Finasteride) .Marland Kitchen.. 1 daily 2)   Mirapex 0.5 Mg Tabs (Pramipexole dihydrochloride) .... 2 daily 3)  Cardura 8 Mg Tabs (Doxazosin mesylate) .Marland Kitchen.. 1 daily 4)  Hydrochlorothiazide 25 Mg Tabs (Hydrochlorothiazide) .Marland Kitchen.. 1 daily 5)  Multivitamins Caps (Multiple vitamin) .Marland Kitchen.. 1 daily 6)  Fish Oil Caps (Omega-3 fatty acids caps) .... Takes otc 7)  Clonazepam 0.5 Mg Tabs (Clonazepam) .Marland Kitchen.. 1-2 at bedtime 8)  Simvastatin 20 Mg Tabs (Simvastatin) .Marland Kitchen.. 1 at bedtime 9)  Lisinopril 20 Mg Tabs (Lisinopril) .Marland Kitchen.. 1 1/2 daily (30mg ) 10)  Sulindac 200 Mg Tabs (Sulindac) .Marland Kitchen.. 1 two times a day as needed for arthritis pain 11)  Viagra 100 Mg Tabs (Sildenafil citrate) .... Take 1/2-1 tablet by mouth once a day 12)  Fibrer Con  .... 2 daily 13)  Vicodin 5-500 Mg Tabs (Hydrocodone-acetaminophen) .... 1/2--1 tab three times a day as needed for severe arthritis pain  Other Orders: Flu Vaccine 9yrs + [id number]) Admin 1st Vaccine [id number])   Patient Instructions: 1)  Please schedule a follow-up appointment in 6 months.    Prescriptions: VICODIN 5-500 MG  TABS (HYDROCODONE-ACETAMINOPHEN) 1/2--1 tab three times a day as needed for severe arthritis pain  #90 x 0   Entered and Authorized by:   Claris Gower MD   Signed by:   Claris Gower MD on 12/12/2006   Method used:   Print then Give to Patient   RxID:   [id number] SULINDAC 200 MG TABS (SULINDAC) 1 two times a day as needed for arthritis pain  #180 x 3   Entered and Authorized by:   Claris Gower MD   Signed by:   Claris Gower MD on 12/12/2006   Method used:   Print then Give to Patient   RxID:   [id number] CLONAZEPAM 0.5 MG TABS (CLONAZEPAM) 1-2 at bedtime  #180 x 0   Entered and Authorized by:   Claris Gower MD   Signed by:   Claris Gower MD on 12/12/2006   Method used:   Print then Give to Patient   RxID:   [id number]  ] Current Allergies (reviewed today): ! * STATINS LIPITOR PRAVACHOL Current Medications (including changes made in  today's visit):  PROSCAR 5 MG TABS (FINASTERIDE) 1 daily MIRAPEX 0.5 MG TABS (PRAMIPEXOLE DIHYDROCHLORIDE) 2 daily CARDURA 8 MG TABS (DOXAZOSIN MESYLATE) 1 daily HYDROCHLOROTHIAZIDE 25 MG TABS (HYDROCHLOROTHIAZIDE) 1 daily MULTIVITAMINS  CAPS (MULTIPLE VITAMIN) 1 daily FISH OIL  CAPS (OMEGA-3 FATTY ACIDS CAPS) takes OTC CLONAZEPAM 0.5 MG TABS (CLONAZEPAM) 1-2 at bedtime SIMVASTATIN 20 MG TABS (SIMVASTATIN) 1 at bedtime LISINOPRIL 20 MG TABS (LISINOPRIL) 1 1/2 daily (30mg ) SULINDAC 200 MG TABS (  SULINDAC) 1 two times a day as needed for arthritis pain VIAGRA 100 MG TABS (SILDENAFIL CITRATE) Take 1/2-1 tablet by mouth once a day * FIBRER CON 2 daily VICODIN 5-500 MG  TABS (HYDROCODONE-ACETAMINOPHEN) 1/2--1 tab three times a day as needed for severe arthritis pain    Influenza Vaccine    Vaccine Type: Fluvax 3+    Site: right deltoid    Mfr: Sanofi Pasteur    Dose: 0.5 ml    Route: IM    Given by: Lelon Mast    Exp. Date: 08/28/2007    Lot #: [id number]    VIS given: 08/27/04 version given December 12, 2006.

## 2010-03-30 NOTE — Progress Notes (Signed)
Summary: rx to help abd  Phone Note Call from Patient Call back at Home Phone [phone]   Refills Requested: Medication #1:  SULINDAC 200 MG TABS 1 two times a day as needed for arthritis pain Caller: Patient Call For: letvak Summary of Call: pt takes sulendac for his knees but he says it's beginning to bother his stomach, he heard there was a med that can counter act that and wants to try it. not sure if it's otc or rx? he is scheduled for labs tomorrow to check potassium can pick up rx then. He does not want to d/c sulendc. Initial call taken by: Daralene Milch,  December 25, 2006 9:38 AM  Follow-up for Phone Call        often an acid blocker--specifically prilosec or generic omeprazole can counteract the stomach upset Please try 20mg  tab in morning about 1/2 hour before breakfast Follow-up by: Claris Gower MD,  December 25, 2006 11:38 AM  Additional Follow-up for Phone Call Additional follow up Details #1::        Advised patient.  ......................................................Marland KitchenDaralene Milch December 25, 2006 11:47 AM         Appended Document: rx to help abd pt called back he wants to get omeprazole rx b/c its less expensive, request rx.  Appended Document: rx to help abd    Phone Note Call from Patient   Caller: Patient Call For: letvak Summary of Call: omeprazole 90x3 3x will p/u tomorrow Initial call taken by: Daralene Milch,  December 25, 2006 12:58 PM  Follow-up for Phone Call        may be cheaper OTC  but Rx is okay Follow-up by: Claris Gower MD,  December 25, 2006 1:09 PM  Additional Follow-up for Phone Call Additional follow up Details #1::        left message to pick up Additional Follow-up by: Lelon Mast,  December 25, 2006 2:00 PM    New/Updated Medications: OMEPRAZOLE 20 MG  CPDR (OMEPRAZOLE) one by mouth 1/2 hour prior to breakfast.   Prescriptions: OMEPRAZOLE 20 MG  CPDR (OMEPRAZOLE) one by mouth 1/2 hour prior to  breakfast.  #90 x 3   Entered by:   Daralene Milch   Authorized by:   Claris Gower MD   Signed by:   Daralene Milch on 12/25/2006   Method used:   Print then Give to Patient   RxIDEL:[id number]      Prior Medications: PROSCAR 5 MG TABS (FINASTERIDE) 1 daily MIRAPEX 0.5 MG TABS (PRAMIPEXOLE DIHYDROCHLORIDE) 2 daily CARDURA 8 MG TABS (DOXAZOSIN MESYLATE) 1 daily HYDROCHLOROTHIAZIDE 25 MG TABS (HYDROCHLOROTHIAZIDE) 1 daily MULTIVITAMINS  CAPS (MULTIPLE VITAMIN) 1 daily FISH OIL  CAPS (OMEGA-3 FATTY ACIDS CAPS) takes OTC CLONAZEPAM 0.5 MG TABS (CLONAZEPAM) 1-2 at bedtime SIMVASTATIN 20 MG TABS (SIMVASTATIN) 1 at bedtime LISINOPRIL 20 MG TABS (LISINOPRIL) 1 1/2 daily (30mg ) SULINDAC 200 MG TABS (SULINDAC) 1 two times a day as needed for arthritis pain VIAGRA 100 MG TABS (SILDENAFIL CITRATE) Take 1/2-1 tablet by mouth once a day FIBRER CON () 2 daily VICODIN 5-500 MG  TABS (HYDROCODONE-ACETAMINOPHEN) 1/2--1 tab three times a day as needed for severe arthritis pain Current Allergies: ! * STATINS LIPITOR PRAVACHOL

## 2010-03-30 NOTE — Assessment & Plan Note (Signed)
Summary: COUGH,SORE THROAT/RBH   Vital Signs:  Patient Profile:   77 Years Old Male Height:     67.4 inches (171.2 cm) Weight:      178 pounds Temp:     97.6 degrees F oral Pulse rate:   60 / minute BP sitting:   175 / 101  (left arm) Cuff size:   regular  Vitals Entered By: Glenna Durand (April 17, 2007 9:50 AM)                 Chief Complaint:  URI sx and wife in hosp w/ lung CA.  History of Present Illness: Here due to URI sx--new cough began yesterday, ST, no fever/chills.  Taking nothing. Wife in [facility] with lung ca, stage 4--difficulty breathing since yesterday.    Current Allergies (reviewed today): ! * STATINS LIPITOR PRAVACHOL     Review of Systems      See HPI   Physical Exam  General:     alert, well-developed, well-nourished, and well-hydrated.  NAD Eyes:     pupils equal, pupils round, and no injection.   Ears:     TMs retracted with increased fluid bilat Nose:     mucosal erythema, mucosal edema, and airflow obstruction.  sinuses neg Mouth:     no exudates and pharyngeal erythema.   Lungs:     moist cough only, normal respiratory effort, no crackles, and no wheezes.   Neurologic:     alert & oriented X3 and gait normal.   Skin:     turgor normal, color normal, and no rashes.   Cervical Nodes:     no anterior cervical adenopathy and no posterior cervical adenopathy.   Psych:     normally interactive and good eye contact.      Impression & Recommendations:  Problem # 1:  URI (ICD-465.9) continue comfort care measures: increase po fluids, rest, tylenol or IBP as needed call if worsens over next 7-10d and will add ABT Delsym as needed cough will use mask in hospital situation discussed hygeine His updated medication list for this problem includes:    Sulindac 200 Mg Tabs (Sulindac) .Marland Kitchen... 1 two times a day as needed for arthritis pain   Complete Medication List: 1)  Proscar 5 Mg Tabs (Finasteride) .Marland Kitchen.. 1 daily 2)  Mirapex 0.5 Mg  Tabs (Pramipexole dihydrochloride) .... 2 daily 3)  Cardura 8 Mg Tabs (Doxazosin mesylate) .Marland Kitchen.. 1 daily 4)  Hydrochlorothiazide 25 Mg Tabs (Hydrochlorothiazide) .Marland Kitchen.. 1 daily 5)  Multivitamins Caps (Multiple vitamin) .Marland Kitchen.. 1 daily 6)  Fish Oil Caps (Omega-3 fatty acids caps) .... Takes otc 7)  Clonazepam 0.5 Mg Tabs (Clonazepam) .Marland Kitchen.. 1-2 at bedtime 8)  Simvastatin 20 Mg Tabs (Simvastatin) .Marland Kitchen.. 1 at bedtime 9)  Lisinopril 20 Mg Tabs (Lisinopril) .Marland Kitchen.. 1 1/2 daily (30mg ) 10)  Sulindac 200 Mg Tabs (Sulindac) .Marland Kitchen.. 1 two times a day as needed for arthritis pain 11)  Viagra 100 Mg Tabs (Sildenafil citrate) .... Take 1/2-1 tablet by mouth once a day 12)  Fibrer Con  .... 2 daily 13)  Vicodin 5-500 Mg Tabs (Hydrocodone-acetaminophen) .... 1/2--1 tab three times a day as needed for severe arthritis pain 14)  Omeprazole 20 Mg Cpdr (Omeprazole) .... One by mouth 1/2 hour prior to breakfast. 15)  Voltaren 1 % Gel (Diclofenac sodium) .... Apply a small to affected area qid.     ] Prior Medications (reviewed today): PROSCAR 5 MG TABS (FINASTERIDE) 1 daily MIRAPEX 0.5 MG TABS (PRAMIPEXOLE DIHYDROCHLORIDE) 2  daily CARDURA 8 MG TABS (DOXAZOSIN MESYLATE) 1 daily HYDROCHLOROTHIAZIDE 25 MG TABS (HYDROCHLOROTHIAZIDE) 1 daily MULTIVITAMINS  CAPS (MULTIPLE VITAMIN) 1 daily FISH OIL  CAPS (OMEGA-3 FATTY ACIDS CAPS) takes OTC CLONAZEPAM 0.5 MG TABS (CLONAZEPAM) 1-2 at bedtime SIMVASTATIN 20 MG TABS (SIMVASTATIN) 1 at bedtime LISINOPRIL 20 MG TABS (LISINOPRIL) 1 1/2 daily (30mg ) SULINDAC 200 MG TABS (SULINDAC) 1 two times a day as needed for arthritis pain VIAGRA 100 MG TABS (SILDENAFIL CITRATE) Take 1/2-1 tablet by mouth once a day FIBRER CON () 2 daily VICODIN 5-500 MG  TABS (HYDROCODONE-ACETAMINOPHEN) 1/2--1 tab three times a day as needed for severe arthritis pain OMEPRAZOLE 20 MG  CPDR (OMEPRAZOLE) one by mouth 1/2 hour prior to breakfast. VOLTAREN 1 %  GEL (DICLOFENAC SODIUM) apply a small to affected  area qid. Current Allergies (reviewed today): ! * STATINS LIPITOR PRAVACHOL

## 2010-03-30 NOTE — Assessment & Plan Note (Signed)
Summary: pulled muscle in back   Vital Signs:  Patient profile:   77 year old male Height:      67.4 inches Weight:      171 pounds BMI:     26.56 Temp:     98.1 degrees F oral Pulse rate:   80 / minute Pulse rhythm:   regular BP sitting:   160 / 86  (left arm) Cuff size:   regular  Vitals Entered By: [name] (June 23, 2008 12:10 PM) CC: Left low back pain, Back Pain   History of Present Illness: 77 year old gentleman here with back pain:  Has a bilateral upcoming   Had spinal stenosis - saw [name], [facility]. x-rays were no different than priors several months ago.  Upcoming B TKR   Back Pain History:      The patient's back pain has been present for < 6 weeks.  The pain is located in the lower back region and does not radiate below the knees.  He states this is not work related.  He states that he has had a prior history of back pain.  The patient has not had any recent physical therapy for his back pain.  The following makes the back pain worse: standing up, bending over, leaning to side.        Description of injury in patient's own words:  The patient stepped down and went down a long way and twisted while he was stepping down and felt some pain in his back. .        Other comments:  Recent x-rays several months ago while at [name] (h/o spinal stenosis s/p surgery), per report these were unchanged. Do not have for my review.    Critical Exclusionary Diagnosis Criteria (CEDC) for Back Pain:      The patient denies a history of previous trauma.  He notes a prior history of spinal surgery.  There are no symptoms to suggest infection, cauda equina, or psychosocial factors for back pain.  Cancer risk factors include age >50 yrs with new back pain.  Other positive CEDC factors include low back pain worse with activity and low back pain worse with lumbar extension (downhill walking-standing-reaching overhead).    Allergies: 1)  ! * Statins 2)  Lipitor 3)  Pravachol  Past  History:  Past medical, surgical, family and social histories (including risk factors) reviewed, and no changes noted (except as noted below).  Past Medical History:    Reviewed history from 02/04/2008 and no changes required:    Diverticulosis, colon05/2005    GERD    Hyperlipidemia    Hypertension    Benign prostatic hypertrophy    Osteoarthritis, severe, B knees  Past Surgical History:    Reviewed history from 05/25/2007 and no changes required:    R 4th finger surgery    Tonsils as child    Lumbar spinal stenosis repair  06/2005  Family History:    Reviewed history from 06/21/2006 and no changes required:       Dad died @72  of "clogged arteries"       Mom died @ 71 of old age, had HTN       1 brother died @24  of polia       No prostate or colon cancer  Social History:    Reviewed history from 06/21/2006 and no changes required:       Retired--school principal       Married--2 adopted children  Never Smoked       Alcohol use-no       Wife has Stage 4 bronchoalveolar cancer  Physical Exam  General:  Well-developed,well-nourished,in no acute distress; alert,appropriate and cooperative throughout examination Head:  Normocephalic and atraumatic without obvious abnormalities. No apparent alopecia or balding. Ears:  no external deformities.   Nose:  no external deformity.   Lungs:  normal respiratory effort.   Msk:  NT GTB Severe knee OA and knee pain with standing Extremities:  2+ B LE edema decreased DP and PT pulses bilaterally  Low Back Pain Physical Exam:    Inspection-deformity:     No    Palpation-spinal tenderness:   No    Motor Exam/Strength:         Left Ankle Dorsiflexion (L5,L4):     normal       Left Great Toe Dorsiflexion (L5,L4):     normal       Left Heel Walk (L5,some L4):     normal       Left Single Squat & Rise-Quads (L4):   normal       Left Toe Walk-calf (S1):       normal       Right Ankle Dorsiflexion (L5,L4):     normal       Right  Great Toe Dorsiflexion (L5,L4):       normal       Right Heel Walk (L5,some L4):     normal       Right Single Squat & Rise Quads (L4):   normal       Right Toe Walk-calf (S1):       normal    Sensory Exam/Pinprick:        Left Medial Foot (L4):   normal       Left Dorsal Foot (L5):   normal       Left Lateral Foot (S1):   normal       Right Medial Foot (L4):   normal       Right Dorsal Foot (L5):   normal       Right Lateral Foot (S1):   normal    Reflexes:        Left Knee Jerk (L4):     normal       Left Ankle Reflex (S1):   normal       Right Knee Jerk:     normal       Right Ankle Reflex (S1):   normal    Straight Leg Raise (SLR):       Left Straight Leg Raise (SLR):   negative       Right Straight Leg Raise (SLR):   negative   Impression & Recommendations:  Problem # 1:  BACK PAIN (ICD-724.5) Assessment New Most c/w acute muscle strain L lumbar and oblique  ROM, heat, stretching Darvocet as needed bad pain Zanaflex at night  will avoid NSAIDS with recent renal failure, now resolved.  His updated medication list for this problem includes:    Tramadol Hcl 50 Mg Tabs (Tramadol hcl) .Marland Kitchen... 1/2 - 1 tab three times a day for pain    Tizanidine Hcl 4 Mg Tabs (Tizanidine hcl) .Marland Kitchen... 1 tab by mouth at bedtime  Complete Medication List: 1)  Proscar 5 Mg Tabs (Finasteride) .Marland Kitchen.. 1 daily 2)  Cardura 8 Mg Tabs (Doxazosin mesylate) .Marland Kitchen.. 1 daily 3)  Hydrochlorothiazide 25 Mg Tabs (Hydrochlorothiazide) .Marland Kitchen.. 1 daily 4)  Multivitamins Caps (Multiple vitamin) .Marland KitchenMarland KitchenMarland Kitchen  1 daily 5)  Fish Oil Caps (Omega-3 fatty acids caps) .... Takes otc 6)  Clonazepam 0.5 Mg Tabs (Clonazepam) .Marland Kitchen.. 1-2 at bedtime 7)  Simvastatin 40 Mg Tabs (Simvastatin) .... Take as directed 8)  Lisinopril 40 Mg Tabs (Lisinopril) .... Take 1/2 tablet by mouth once daily 9)  Viagra 100 Mg Tabs (Sildenafil citrate) .... Take 1/2-1 tablet by mouth once a day 10)  Fibrer Con  .... 2 daily 11)  Omeprazole 20 Mg Cpdr (Omeprazole)  .... One by mouth 1/2 hour prior to breakfast. 12)  Mirapex 1 Mg Tabs (Pramipexole dihydrochloride) .Marland Kitchen.. 1-2 at bedtime for restless legs 13)  Glucosamine Sulfate 1000 Mg Caps (Glucosamine sulfate) .... 2 tablets per day by mouth 14)  Tramadol Hcl 50 Mg Tabs (Tramadol hcl) .... 1/2 - 1 tab three times a day for pain 15)  Potassium Chloride Crys Cr 20 Meq Cr-tabs (Potassium chloride crys cr) .Marland Kitchen.. 1 daily for the next week 16)  Tizanidine Hcl 4 Mg Tabs (Tizanidine hcl) .Marland Kitchen.. 1 tab by mouth at bedtime  Patient Instructions: 1)   Keep active but avoid activities that are painful. Apply moist heat and/or ice to lower back several times a day.  2)  Gentle stretching of the effected side 3)  OK to take tramadol and I would take Tizanidine 1 tablet at night.  Prescriptions: TIZANIDINE HCL 4 MG TABS (TIZANIDINE HCL) 1 tab by mouth at bedtime  #30 x 1   Entered and Authorized by:   Owens Loffler MD   Signed by:   Owens Loffler MD on 06/23/2008   Method used:   Print then Give to Patient   RxID:   [id number]       Prior Medications (reviewed today): PROSCAR 5 MG TABS (FINASTERIDE) 1 daily CARDURA 8 MG TABS (DOXAZOSIN MESYLATE) 1 daily HYDROCHLOROTHIAZIDE 25 MG TABS (HYDROCHLOROTHIAZIDE) 1 daily MULTIVITAMINS  CAPS (MULTIPLE VITAMIN) 1 daily FISH OIL  CAPS (OMEGA-3 FATTY ACIDS CAPS) takes OTC CLONAZEPAM 0.5 MG TABS (CLONAZEPAM) 1-2 at bedtime SIMVASTATIN 40 MG  TABS (SIMVASTATIN) Take as directed LISINOPRIL 40 MG  TABS (LISINOPRIL) Take 1/2 tablet by mouth once daily VIAGRA 100 MG TABS (SILDENAFIL CITRATE) Take 1/2-1 tablet by mouth once a day FIBRER CON () 2 daily OMEPRAZOLE 20 MG  CPDR (OMEPRAZOLE) one by mouth 1/2 hour prior to breakfast. MIRAPEX 1 MG  TABS (PRAMIPEXOLE DIHYDROCHLORIDE) 1-2 at bedtime for restless legs GLUCOSAMINE SULFATE 1000 MG  CAPS (GLUCOSAMINE SULFATE) 2 tablets per day by mouth TRAMADOL HCL 50 MG TABS (TRAMADOL HCL) 1/2 - 1 tab three times a day for  pain POTASSIUM CHLORIDE CRYS CR 20 MEQ CR-TABS (POTASSIUM CHLORIDE CRYS CR) 1 daily for the next week Current Allergies: ! * STATINS LIPITOR PRAVACHOL

## 2010-03-30 NOTE — Progress Notes (Signed)
Summary: Clonazepam  Phone Note Refill Request Message from:  Fax from Pharmacy on February 09, 2009 2:57 PM  Refills Requested: Medication #1:  CLONAZEPAM 0.5 MG TABS 1-2 at bedtime [place]  Phone:   [phone]  Form in box.   Method Requested: Telephone to Pharmacy Initial call taken by: Christena Deem CMA Deborra Medina),  February 09, 2009 2:58 PM  Follow-up for Phone Call        okay #180 x 1 Follow-up by: Claris Gower MD,  February 09, 2009 3:55 PM  Additional Follow-up for Phone Call Additional follow up Details #1::        Rx faxed to pharmacy Additional Follow-up by: DeShannon Smith CMA Deborra Medina),  February 09, 2009 4:03 PM    Prescriptions: CLONAZEPAM 0.5 MG TABS (CLONAZEPAM) 1-2 at bedtime  #180 x 1   Entered by:   Edwin Dada CMA ([place])   Authorized by:   Claris Gower MD   Signed by:   Edwin Dada CMA ([place]) on 02/09/2009   Method used:   Handwritten   [id number]

## 2010-03-30 NOTE — Progress Notes (Signed)
Summary: wants HIV test  Phone Note Call from Patient Call back at Home Phone [phone]   Caller: Patient Call For: Claris Gower MD Summary of Call: Pt is requesting to have an HIV test.   Please advise.  He did not do a home test. Initial call taken by: [name] CMA,  Jul 10, 2009 1:13 PM  Follow-up for Phone Call        okay to set up blood draw for HIV  [id number] or exposure to venereal disease Follow-up by: Claris Gower MD,  Jul 10, 2009 1:18 PM  Additional Follow-up for Phone Call Additional follow up Details #1::        Terri, this pt has questions regarding HIV testing.  Will you please call him.     [name] CMA  Jul 10, 2009 1:20 PM     Additional Follow-up for Phone Call Additional follow up Details #2::    cost of HIV is 131.50, pt scheduled to have it done. Follow-up by: Selinda Orion,  Jul 10, 2009 2:54 PM

## 2010-03-30 NOTE — Assessment & Plan Note (Signed)
Summary: FEVER,CHILLS/CLE   Vital Signs:  Patient Profile:   77 Years Old Male Height:     67.4 inches (171.2 cm) Weight:      181 pounds Temp:     98.6 degrees F oral Pulse rate:   74 / minute BP sitting:   134 / 78  (right arm) Cuff size:   regular  Vitals Entered By: Glenna Durand (October 01, 2007 3:08 PM)                 Chief Complaint:  fever, chills abd pain, alot of flatulence, HA, and weak.  History of Present Illness: Here due to fever/chills, abd pain, increased flatus--since this morning --woke up with the shakes this morning --temp up to 99.5  ~11 am --developed abd pain--ate cereal, banana and milk this morning--had herring and sour cream yesterday-didnt taste right.  no diarrhea, had nl BM this morning. --taking reseratrol ultra--a red wine extract--taking  ~1wk--tablet --feels minimally better at this visit--3:25pm than earlier  Wife not doing well, no further chemo.  Understands she has expectancy of 6 mo--he reports he is handling fairly well.    Current Allergies (reviewed today): ! * STATINS LIPITOR PRAVACHOL     Review of Systems      See HPI   Physical Exam  General:     alert, well-developed, well-nourished, and well-hydrated.   Ears:     R ear normal and L ear normal.   Nose:     no mucosal edema and no airflow obstruction.   Mouth:     pharynx pink and moist and no exudates.   Lungs:     normal respiratory effort, no intercostal retractions, no accessory muscle use, and normal breath sounds.   Abdomen:     soft, non-tender, normal bowel sounds, no distention, no masses, no guarding, no abdominal hernia, no inguinal hernia, no hepatomegaly, and no splenomegaly.   Skin:     turgor normal.   Cervical Nodes:     no anterior cervical adenopathy and no posterior cervical adenopathy.   Psych:     normally interactive.      Impression & Recommendations:  Problem # 1:  VIRAL INFECTION (ICD-079.99) Assessment: New suspect has  mild viral illness--low grade fever and abd pain  bland foods and clear liquids for next 1-2d tylenol as needed will follow closely see back if worsens His updated medication list for this problem includes:    Sulindac 200 Mg Tabs (Sulindac) .Marland Kitchen... 1 two times a day as needed for arthritis pain   Complete Medication List: 1)  Proscar 5 Mg Tabs (Finasteride) .Marland Kitchen.. 1 daily 2)  Cardura 8 Mg Tabs (Doxazosin mesylate) .Marland Kitchen.. 1 daily 3)  Hydrochlorothiazide 25 Mg Tabs (Hydrochlorothiazide) .Marland Kitchen.. 1 daily 4)  Multivitamins Caps (Multiple vitamin) .Marland Kitchen.. 1 daily 5)  Fish Oil Caps (Omega-3 fatty acids caps) .... Takes otc 6)  Clonazepam 0.5 Mg Tabs (Clonazepam) .Marland Kitchen.. 1-2 at bedtime 7)  Simvastatin 40 Mg Tabs (Simvastatin) .... Take as directed 8)  Lisinopril 40 Mg Tabs (Lisinopril) .... Take 1/2 tablet by mouth once daily 9)  Sulindac 200 Mg Tabs (Sulindac) .Marland Kitchen.. 1 two times a day as needed for arthritis pain 10)  Viagra 100 Mg Tabs (Sildenafil citrate) .... Take 1/2-1 tablet by mouth once a day 11)  Fibrer Con  .... 2 daily 12)  Vicodin 5-500 Mg Tabs (Hydrocodone-acetaminophen) .... 1/2--1 tab three times a day as needed for severe arthritis pain 13)  Omeprazole 20 Mg  Cpdr (Omeprazole) .... One by mouth 1/2 hour prior to breakfast. 14)  Voltaren 1 % Gel (Diclofenac sodium) .... Apply a small to affected area qid. 15)  Mirapex 1 Mg Tabs (Pramipexole dihydrochloride) .Marland Kitchen.. 1-2 at bedtime for restless legs 16)  Glucosamine Sulfate 1000 Mg Caps (Glucosamine sulfate) .... 2 tablets per day by mouth    ] Prior Medications (reviewed today): PROSCAR 5 MG TABS (FINASTERIDE) 1 daily CARDURA 8 MG TABS (DOXAZOSIN MESYLATE) 1 daily HYDROCHLOROTHIAZIDE 25 MG TABS (HYDROCHLOROTHIAZIDE) 1 daily MULTIVITAMINS  CAPS (MULTIPLE VITAMIN) 1 daily FISH OIL  CAPS (OMEGA-3 FATTY ACIDS CAPS) takes OTC CLONAZEPAM 0.5 MG TABS (CLONAZEPAM) 1-2 at bedtime SIMVASTATIN 40 MG  TABS (SIMVASTATIN) Take as directed LISINOPRIL 40 MG   TABS (LISINOPRIL) Take 1/2 tablet by mouth once daily SULINDAC 200 MG TABS (SULINDAC) 1 two times a day as needed for arthritis pain VIAGRA 100 MG TABS (SILDENAFIL CITRATE) Take 1/2-1 tablet by mouth once a day FIBRER CON () 2 daily VICODIN 5-500 MG  TABS (HYDROCODONE-ACETAMINOPHEN) 1/2--1 tab three times a day as needed for severe arthritis pain OMEPRAZOLE 20 MG  CPDR (OMEPRAZOLE) one by mouth 1/2 hour prior to breakfast. VOLTAREN 1 %  GEL (DICLOFENAC SODIUM) apply a small to affected area qid. MIRAPEX 1 MG  TABS (PRAMIPEXOLE DIHYDROCHLORIDE) 1-2 at bedtime for restless legs GLUCOSAMINE SULFATE 1000 MG  CAPS (GLUCOSAMINE SULFATE) 2 tablets per day by mouth Current Allergies (reviewed today): ! * STATINS LIPITOR PRAVACHOL

## 2010-03-30 NOTE — Progress Notes (Signed)
Summary: wants to try new drug  Phone Note Call from Patient Call back at Home Phone [phone]   Caller: Patient Call For: Stephen Gower MD Summary of Call: Pt states there is a new drug on the market similar to ambien, called intermezzo- 3.5 mg is the recommended dose for men.  Pt is asking if you will prescribe this for him in place of ambien. Uses [facility] [employer]. Initial call taken by: [name] CMA, AAMA,  February 02, 2010 8:40 AM  Follow-up for Phone Call        Please call him  I am not a fan of new medications---esp sleep meds When they first come out, you often don't know what kind of problems will occur till it is used for a while. I am not comfortable prescribing new meds like this. Follow-up by: Stephen Gower MD,  February 02, 2010 8:43 AM  Additional Follow-up for Phone Call Additional follow up Details #1::        Spoke with patient and advised results, patient understands. Additional Follow-up by: Edwin Dada CMA Deborra Medina),  February 02, 2010 9:39 AM

## 2010-03-30 NOTE — Progress Notes (Signed)
Summary: feet and ankles swollen  Phone Note From Other Clinic Call back at Home Phone [phone]   Caller: Lennette Bihari, PT with [facility] Summary of Call: Physical therapist is working with pt and states that his feet and ankles are swollen. He is elevating them and wearing ted hose.  Pt had been on lasix but Dr. Silvio Pate took him off of it.  He has some and is asking if it's ok for him to take it to relieve the swelling. Initial call taken by: [name],  August 29, 2008 9:11 AM  Follow-up for Phone Call        OK to take lasix 20 mg, take for next 2 days then when significant swelling  needs to f/u with Dr. Silvio Pate within the next 1-2 weeks Follow-up by: Owens Loffler MD,  August 29, 2008 9:22 AM  Additional Follow-up for Phone Call Additional follow up Details #1::        Patient advised and already has appt 09-11-2008 Additional Follow-up by: Zenda Alpers CMA,  August 29, 2008 9:36 AM

## 2010-03-30 NOTE — Progress Notes (Signed)
Summary: Volteran rx  Phone Note Call from Patient Call back at Home Phone [phone]   Caller: Patient Call For: letvak Summary of Call: voltarin gel rx,  pt heard it was now out on market and he wants to use it for his knees. he wants to know if he can have an rx or if you would be ok with him getting it through a [facility] without a prescription. Initial call taken by: Daralene Milch,  March 21, 2007 10:29 AM  Follow-up for Phone Call        I would do either These type of medicines are mostly OTC here also Have him let me know if he wants an Rx Sometimes there is a lag between approval and availability--he may want to check his local pharmacy to see if they have it Follow-up by: Claris Gower MD,  March 21, 2007 10:58 AM  Additional Follow-up for Phone Call Additional follow up Details #1::        Advised patient.  ......................................................Marland KitchenDaralene Milch March 21, 2007 2:26 PM  he says they do have it here he found it at [facility], he does want an rx b/c ins will cover, but it will need prior auth ([phone], what is the quantity and directions so I can call for auth ..................................................................Marland KitchenDaralene Milch  March 21, 2007 2:27 PM     Additional Follow-up for Phone Call Additional follow up Details #2::    I have never used the topical. Get usual dosage, etc from pharmacist.  High risk so should be on topical instead of by mouth ..................................................................Marland KitchenRichard Horton Marshall MD  March 21, 2007 6:23 PM   I called company for [employer], they need to know if pt has difficulty swallowing that would prevent him from taking tabs, and if the pt has tried 2 prescription nsaids in past.  ..................................................................Marland KitchenDaralene Milch  March 23, 2007 10:23 AM check with him--I don't think he has swallowing problems but  the transdermal should increase the risk of stomach ulceration in this 77 year old man ..................................................................Marland KitchenRichard Horton Marshall MD  March 23, 2007 11:50 AM    Additional Follow-up for Phone Call Additional follow up Details #3:: Details for Additional Follow-up Action Taken: Prior auth approved for Volteran 1% gel directions are  to apply a small to affected area qid. ..................................................................Marland KitchenDaralene Milch  March 23, 2007 3:27 PM pt request rx okay to approve 1 month supply with 2 refills ..................................................................Marland KitchenRichard Horton Marshall MD  March 23, 2007 3:44 PM   pt decided to use mail order, rx was printed and left in inbox ..................................................................Marland KitchenDaralene Milch  March 23, 2007 4:37 PM   New/Updated Medications: VOLTAREN 1 %  GEL (DICLOFENAC SODIUM) apply a small to affected area qid.   Prescriptions: VOLTAREN 1 %  GEL (DICLOFENAC SODIUM) apply a small to affected area qid.  #3 months x 0   Entered by:   Daralene Milch   Authorized by:   Claris Gower MD   Signed by:   Daralene Milch on 03/23/2007   Method used:   Print then Give to Patient   RxID:   [id number]     Appended Document: Volteran rx phoned pt to pick up rx

## 2010-03-30 NOTE — Progress Notes (Signed)
Summary: wants oxycodone  Phone Note Refill Request Call back at Home Phone [phone] Message from:  Patient  Refills Requested: Medication #1:  Oxycodone 5  one every 4 hours as needed for pain Phoned request from pt, he wants #30.  He had bilateral knee replacement and was getting this while at rehab.  Please call when ready.  Initial call taken by: [name],  August 20, 2008 12:02 PM  Follow-up for Phone Call        Rx written If he needs this ongoing, will probably have to review with the ortho pedist Follow-up by: Claris Gower MD,  August 20, 2008 1:19 PM  Additional Follow-up for Phone Call Additional follow up Details #1::        Spoke with Stephen Garrett and advised results.  Additional Follow-up by: Edwin Dada CMA,  August 20, 2008 1:23 PM    New/Updated Medications: OXYCODONE HCL 5 MG TABS (OXYCODONE HCL) 1 tab three times a day as needed for severe pain   Prescriptions: OXYCODONE HCL 5 MG TABS (OXYCODONE HCL) 1 tab three times a day as needed for severe pain  #30 x 0   Entered and Authorized by:   Claris Gower MD   Signed by:   Claris Gower MD on 08/20/2008   Method used:   Print then Give to Patient   RxID:   [id number]

## 2010-03-30 NOTE — Progress Notes (Signed)
Summary: pt has ? about Potassium  Phone Note Call from Patient Call back at [phone]   Caller: Patient Call For: Stephen Gower MD Summary of Call: Pt had cataract surgery yesterday and K+ at hospital was 4.1. Pt wondered if should continue the Potassium supplement. Pt presently taking Potassium Chloride 56meq one daily. BP 135/75 so new BP pill is working great. Pt said to call back at [phone] about taking Potassium and if no answer please leave a message. Please advise.  Initial call taken by: Ozzie Hoyle LPN,  January 25, 624THL 8:39 AM  Follow-up for Phone Call        Please take for 1 additional week then stop I will recheck his potassium at his next visit Follow-up by: Stephen Gower MD,  March 24, 2009 9:05 AM  Additional Follow-up for Phone Call Additional follow up Details #1::        left message on machine with results. Additional Follow-up by: Edwin Dada CMA Deborra Medina),  March 24, 2009 9:16 AM

## 2010-03-30 NOTE — Progress Notes (Signed)
Summary: refills on proscar and klonopin  Phone Note Refill Request Message from:  Patient  Refills Requested: Medication #1:  CLONAZEPAM 0.5 MG TABS 1-2 at bedtime  Medication #2:  PROSCAR 5 MG TABS 1 daily Pt wants 90 day supplies with as many refills as possible called to [facility], and ask them to deliver.  Phone [phone].  Initial call taken by: [name],  August 22, 2008 8:45 AM  Follow-up for Phone Call        okay #180 x 1 for clonazepam and #90 x 3 for proscar Follow-up by: Claris Gower MD,  August 22, 2008 1:06 PM      Prescriptions: CLONAZEPAM 0.5 MG TABS (CLONAZEPAM) 1-2 at bedtime  #180 x 1   Entered by:   [name]   Authorized by:   Claris Gower MD   Signed by:   [name] on 08/22/2008   Method used:   Telephoned to ...       [facility] (retail)       [place], [place]  [address]       Ph: [phone]       Fax: [phone]   RxID:   [id number] PROSCAR 5 MG TABS (FINASTERIDE) 1 daily  #90 x 3   Entered by:   [name]   Authorized by:   Claris Gower MD   Signed by:   [name] on 08/22/2008   Method used:   Telephoned to ...       [facility]* (retail)       [address] [place], [place]  [address]       Ph: [phone]       Fax: [phone]   RxID:   [id number]

## 2010-03-30 NOTE — Assessment & Plan Note (Signed)
Summary: WOUND  WON'T HEAL   Vital Signs:  Patient Profile:   76 Years Old Male Height:     67.4 inches (171.2 cm) Weight:      175.13 pounds Temp:     97.7 degrees F oral Pulse rate:   76 / minute BP sitting:   144 / 86  (left arm)  Vitals Entered By: Lelon Mast (Jul 24, 2007 12:30 PM)                 Chief Complaint:  wound wont heal.  History of Present Illness: Hit left upper calf on metal piece of wife's walker Happened about 6 weeks ago tried bandage but has had it sticking nad he has been taking the scab off each time he takes the bandage off Has tried bacitracin and then regular gauze  No inflammation some bleeding when he tears off the dressing concerned that part of the bandage is still  on there    Current Allergies (reviewed today): ! * STATINS LIPITOR PRAVACHOL  Past Medical History:    Reviewed history from 12/12/2006 and no changes required:       Diverticulosis, colon05/2005       GERD       Hyperlipidemia       Hypertension       Benign prostatic hypertrophy       Osteoarthritis  Past Surgical History:    Reviewed history from 05/25/2007 and no changes required:       R 4th finger surgery       Tonsils as child       Lumbar spinal stenosis repair  06/2005   Social History:    Reviewed history from 06/21/2006 and no changes required:       Retired--school principal       Married--2 adopted children       Never Smoked       Alcohol use-no       Wife has Stage 4 bronchoalveolar cancer    Review of Systems  The patient denies chest pain, syncope, and dyspnea on exertion.         wife still doing okay No real depression   Physical Exam  General:     alert.  NAD Skin:     irregular shaped ulcer on upper anterior left calf active borders and some clean granulation tissue    Impression & Recommendations:  Problem # 1:  ULCER OF LOWER LIMB, UNSPECIFIED (ICD-707.10) Assessment: New needs to keep from debriding will try  silvadene cream and telfa dressings   Problem # 2:  HYPERTENSION (ICD-401.9) Assessment: Unchanged doing okay  His updated medication list for this problem includes:    Cardura 8 Mg Tabs (Doxazosin mesylate) .Marland Kitchen... 1 daily    Hydrochlorothiazide 25 Mg Tabs (Hydrochlorothiazide) .Marland Kitchen... 1 daily    Lisinopril 20 Mg Tabs (Lisinopril) .Marland Kitchen... 1 1/2 daily (30mg )  BP today: 144/86 Prior BP: 144/74 (05/25/2007)  Labs Reviewed: Creat: 1.3 (12/12/2006) Chol: 172 (12/12/2006)   HDL: 36.5 (12/12/2006)   LDL: 113 (12/12/2006)   TG: 115 (12/12/2006)  Orders: TLB-CBC Platelet - w/Differential (85025-CBCD) Venipuncture [id number]) TLB-Renal Function Panel (80069-RENAL) TLB-TSH (Thyroid Stimulating Hormone) (84443-TSH)   Problem # 3:  HYPERLIPIDEMIA (ICD-272.4) Assessment: Unchanged due for labs  His updated medication list for this problem includes:    Simvastatin 20 Mg Tabs (Simvastatin) .Marland Kitchen... 1 at bedtime  Labs Reviewed: Chol: 172 (12/12/2006)   HDL: 36.5 (12/12/2006)   LDL: 113 (12/12/2006)   TG:  115 (12/12/2006) SGOT: 35 (12/12/2006)   SGPT: 27 (12/12/2006)  Orders: TLB-Lipid Panel (80061-LIPID) TLB-Hepatic/Liver Function Pnl (80076-HEPATIC)   Problem # 4:  SPINAL STENOSIS, LUMBAR (ICD-724.02) Assessment: Unchanged still able to play tennis once weekly  Complete Medication List: 1)  Proscar 5 Mg Tabs (Finasteride) .Marland Kitchen.. 1 daily 2)  Cardura 8 Mg Tabs (Doxazosin mesylate) .Marland Kitchen.. 1 daily 3)  Hydrochlorothiazide 25 Mg Tabs (Hydrochlorothiazide) .Marland Kitchen.. 1 daily 4)  Multivitamins Caps (Multiple vitamin) .Marland Kitchen.. 1 daily 5)  Fish Oil Caps (Omega-3 fatty acids caps) .... Takes otc 6)  Clonazepam 0.5 Mg Tabs (Clonazepam) .Marland Kitchen.. 1-2 at bedtime 7)  Simvastatin 20 Mg Tabs (Simvastatin) .Marland Kitchen.. 1 at bedtime 8)  Lisinopril 20 Mg Tabs (Lisinopril) .Marland Kitchen.. 1 1/2 daily (30mg ) 9)  Sulindac 200 Mg Tabs (Sulindac) .Marland Kitchen.. 1 two times a day as needed for arthritis pain 10)  Viagra 100 Mg Tabs (Sildenafil citrate) ....  Take 1/2-1 tablet by mouth once a day 11)  Fibrer Con  .... 2 daily 12)  Vicodin 5-500 Mg Tabs (Hydrocodone-acetaminophen) .... 1/2--1 tab three times a day as needed for severe arthritis pain 13)  Omeprazole 20 Mg Cpdr (Omeprazole) .... One by mouth 1/2 hour prior to breakfast. 14)  Voltaren 1 % Gel (Diclofenac sodium) .... Apply a small to affected area qid. 15)  Mirapex 1 Mg Tabs (Pramipexole dihydrochloride) .Marland Kitchen.. 1-2 at bedtime for restless legs 16)  Glucosamine Sulfate 1000 Mg Caps (Glucosamine sulfate) .... 2 tablets per day by mouth 17)  Silvadene 1 % Crea (Silver sulfadiazine) .... Apply to wound daily till healed   Patient Instructions: 1)  use the silvadene cream on the ulcer daily till scab is formed. Use a telfa, no-stick dressing 2)  Please schedule a follow-up appointment in 6 months.   Prescriptions: SILVADENE 1 %  CREA (SILVER SULFADIAZINE) apply to wound daily till healed  #15gm x 1   Entered and Authorized by:   Claris Gower MD   Signed by:   Claris Gower MD on 07/24/2007   Method used:   Electronically sent to ...       [facility] [place], [place]  [address]       Ph: [phone]       Fax: [phone]   RxID:   [id number]  ] Current Allergies (reviewed today): ! * STATINS LIPITOR PRAVACHOL Current Medications (including changes made in today's visit):  PROSCAR 5 MG TABS (FINASTERIDE) 1 daily CARDURA 8 MG TABS (DOXAZOSIN MESYLATE) 1 daily HYDROCHLOROTHIAZIDE 25 MG TABS (HYDROCHLOROTHIAZIDE) 1 daily MULTIVITAMINS  CAPS (MULTIPLE VITAMIN) 1 daily FISH OIL  CAPS (OMEGA-3 FATTY ACIDS CAPS) takes OTC CLONAZEPAM 0.5 MG TABS (CLONAZEPAM) 1-2 at bedtime SIMVASTATIN 20 MG TABS (SIMVASTATIN) 1 at bedtime LISINOPRIL 20 MG TABS (LISINOPRIL) 1 1/2 daily (30mg ) SULINDAC 200 MG TABS (SULINDAC) 1 two times a day as needed for arthritis pain VIAGRA 100 MG TABS (SILDENAFIL CITRATE) Take  1/2-1 tablet by mouth once a day * FIBRER CON 2 daily VICODIN 5-500 MG  TABS (HYDROCODONE-ACETAMINOPHEN) 1/2--1 tab three times a day as needed for severe arthritis pain OMEPRAZOLE 20 MG  CPDR (OMEPRAZOLE) one by mouth 1/2 hour prior to breakfast. VOLTAREN 1 %  GEL (DICLOFENAC SODIUM) apply a small to affected area qid. MIRAPEX 1 MG  TABS (PRAMIPEXOLE DIHYDROCHLORIDE) 1-2 at bedtime for restless legs GLUCOSAMINE  SULFATE 1000 MG  CAPS (GLUCOSAMINE SULFATE) 2 tablets per day by mouth SILVADENE 1 %  CREA (SILVER SULFADIAZINE) apply to wound daily till healed

## 2010-03-30 NOTE — Progress Notes (Signed)
  Phone Note Refill Request Message from:  [id number] Requested: Medication #1:  VIAGRA 100 MG TABS Take 1/2-1 tablet by mouth once a day Initial call taken by: Lelon Mast,  May 24, 2007 8:37 AM  Follow-up for Phone Call        okay #10 x 3 could be done electronically  Follow-up by: Claris Gower MD,  May 24, 2007 1:34 PM  Additional Follow-up for Phone Call Additional follow up Details #1::        rx called Additional Follow-up by: Lelon Mast,  May 24, 2007 1:40 PM

## 2010-03-30 NOTE — Progress Notes (Signed)
Summary: would like celebrex  Phone Note Call from Patient Call back at Home Phone [phone]   Caller: Patient Call For: Claris Gower MD Summary of Call: Pt wants to change from sulindac to celebrex.  He uses [employer].  He was told to stop taking sulindac but he hasnt yet.  His knee surgery was pushed back to may because his wife passed. Initial call taken by: [name],  June 09, 2008 11:21 AM  Follow-up for Phone Call        Spoke to patient discussed renal risks with any NSAID he thinks he can get by with just the tramadol for now  discussed his grieving someone suggested he get an antidepressant It sounds like he is going through a normal grieving process so no meds needed. Advised appt if worsening mood Follow-up by: Claris Gower MD,  June 09, 2008 1:59 PM

## 2010-03-30 NOTE — Letter (Signed)
Summary: [facility]  [facility]   Imported By: Laural Benes 01/04/2010 14:43:54  _____________________________________________________________________  External Attachment:    Type:   Image     Comment:   External Document  Appended Document: [facility] doing well after hernia repair

## 2010-03-30 NOTE — Consult Note (Signed)
Summary: [facility]  [facility]   Imported By: Edmonia James 10/15/2009 13:25:00  _____________________________________________________________________  External Attachment:    Type:   Image     Comment:   External Document  Appended Document: [facility] planning hernia repair on September 23rd

## 2010-03-30 NOTE — Assessment & Plan Note (Signed)
Summary: PRE-OP CLEARANCE/CLE   Vital Signs:  Patient profile:   77 year old male Weight:      173 pounds BMI:     26.87 Temp:     98.2 degrees F oral Pulse rate:   76 / minute Pulse rhythm:   regular BP sitting:   128 / 80  (left arm) Cuff size:   regular  Vitals Entered By: Edwin Dada CMA (May 14, 2008 12:25 PM) CC: pre-op clearance   History of Present Illness: Here for preoperative evaluation Scheduled for left TKR 3/26  Daughter will live with them for 2 weeks post op Wife still living despite her years long lung cancer--has hospice and an aide  preop labs show renal insuff which is new and hypokalemia Feels fine  No fatigue  No chest pain Exercise tolerance stable--goes to gym at [facility] Had to give up tennis due to knee No dizziness or fainting spells  Allergies: 1)  ! * Statins 2)  Lipitor 3)  Pravachol  Past History:  Past Medical History:    Diverticulosis, colon05/2005    GERD    Hyperlipidemia    Hypertension    Benign prostatic hypertrophy    Osteoarthritis, severe, B knees     (02/04/2008)  Past Surgical History:    R 4th finger surgery    Tonsils as child    Lumbar spinal stenosis repair  06/2005     (05/25/2007)  Family History:    Dad died @72  of "clogged arteries"    Mom died @ 72 of old age, had HTN    1 brother died @24  of polia    No prostate or colon cancer (06/21/2006)  Social History:    Retired--school principal    Married--2 adopted children    Never Smoked    Alcohol use-no    Wife has Stage 4 bronchoalveolar cancer     (06/21/2006)   Review of Systems       sleeps okay but in shifts--gets up for nocturia than goes back a couple of times Sleeps better since on tramadol at night appetite is fine Weight has been going down deliberately--watching diet Edema is better now--echo was normal 3 months ago. Does come on by evening but resolved by daytime   Impression & Recommendations:  Problem # 1:  RENAL  INSUFFICIENCY, ACUTE (ICD-585.9) Assessment New likely related to furosemide and HCTZ together sulindac may be involved Hypokalemia probably from the furosemide  P; stop the furosemide and sulindac    potassium supplements for the next week    recheck labs in 1 week or so  Problem # 2:  HYPERTENSION (ICD-401.9) Assessment: Unchanged  good control no apparent coronary ischemia no contraindications to proposed surgery once potassium corrected ( and preferably creatinine back to baseline  The following medications were removed from the medication list:    Furosemide 20 Mg Tabs (Furosemide) .Marland Kitchen... 1 by mouth daily His updated medication list for this problem includes:    Cardura 8 Mg Tabs (Doxazosin mesylate) .Marland Kitchen... 1 daily    Hydrochlorothiazide 25 Mg Tabs (Hydrochlorothiazide) .Marland Kitchen... 1 daily    Lisinopril 40 Mg Tabs (Lisinopril) .Marland Kitchen... Take 1/2 tablet by mouth once daily  BP today: 128/80 Prior BP: 130/80 (02/04/2008)  Labs Reviewed: Creat: 1.4 (11/28/2007) Chol: 147 (07/24/2007)   HDL: 29.1 (07/24/2007)   LDL: 101 (07/24/2007)   TG: 87 (07/24/2007)  Orders: EKG w/ Interpretation (93000)  Complete Medication List: 1)  Proscar 5 Mg Tabs (Finasteride) .Marland Kitchen.. 1 daily  2)  Cardura 8 Mg Tabs (Doxazosin mesylate) .Marland Kitchen.. 1 daily 3)  Hydrochlorothiazide 25 Mg Tabs (Hydrochlorothiazide) .Marland Kitchen.. 1 daily 4)  Multivitamins Caps (Multiple vitamin) .Marland Kitchen.. 1 daily 5)  Fish Oil Caps (Omega-3 fatty acids caps) .... Takes otc 6)  Clonazepam 0.5 Mg Tabs (Clonazepam) .Marland Kitchen.. 1-2 at bedtime 7)  Simvastatin 40 Mg Tabs (Simvastatin) .... Take as directed 8)  Lisinopril 40 Mg Tabs (Lisinopril) .... Take 1/2 tablet by mouth once daily 9)  Viagra 100 Mg Tabs (Sildenafil citrate) .... Take 1/2-1 tablet by mouth once a day 10)  Fibrer Con  .... 2 daily 11)  Omeprazole 20 Mg Cpdr (Omeprazole) .... One by mouth 1/2 hour prior to breakfast. 12)  Mirapex 1 Mg Tabs (Pramipexole dihydrochloride) .Marland Kitchen.. 1-2 at bedtime for  restless legs 13)  Glucosamine Sulfate 1000 Mg Caps (Glucosamine sulfate) .... 2 tablets per day by mouth 14)  Tramadol Hcl 50 Mg Tabs (Tramadol hcl) .... 1/2 - 1 tab three times a day for pain 15)  Potassium Chloride Crys Cr 20 Meq Cr-tabs (Potassium chloride crys cr) .Marland Kitchen.. 1 daily for the next week  Patient Instructions: 1)  Stop sulindac and furosemide 2)  Take the potassium supplement daily for the next week 3)  Please return for renal profile next week--Monday or Tuesday 4)  The medication list was reviewed and reconciled.  All changed / newly prescribed medications were explained.  A complete medication list was provided to the patient / caregiver. 5)  Please schedule a follow-up appointment in 4 months .  6)  The medication list was reviewed and reconciled.  All changed / newly prescribed medications were explained.  A complete medication list was provided to the patient / caregiver. Prescriptions: POTASSIUM CHLORIDE CRYS CR 20 MEQ CR-TABS (POTASSIUM CHLORIDE CRYS CR) 1 daily for the next week  #7 x 0   Entered and Authorized by:   Claris Gower MD   Signed by:   Claris Gower MD on 05/14/2008   Method used:   Electronically to        [facility]* (retail)       [place], [place]  [address]       Ph: [phone]       Fax: [phone]   RxID:   [id number]       Current Allergies (reviewed today): ! * Earlene Plater PRAVACHOL  EKG  Procedure date:  05/14/2008  Findings:      sinus @62  Normal variant

## 2010-03-30 NOTE — Miscellaneous (Signed)
  Clinical Lists Changes  Allergies: Added new allergy or adverse reaction of * STATINS     Prior Medications: PROSCAR 5 MG TABS (FINASTERIDE) 1 daily MIRAPEX 0.5 MG TABS (PRAMIPEXOLE DIHYDROCHLORIDE) 2 daily CARDURA 8 MG TABS (DOXAZOSIN MESYLATE) 1 daily HYDROCHLOROTHIAZIDE 25 MG TABS (HYDROCHLOROTHIAZIDE) 1 daily MULTIVITAMINS  CAPS (MULTIPLE VITAMIN) 1 daily FISH OIL  CAPS (OMEGA-3 FATTY ACIDS CAPS) takes OTC CLONAZEPAM 0.5 MG TABS (CLONAZEPAM) 2 at bedtime SIMVASTATIN 20 MG TABS (SIMVASTATIN) 1 at bedtime LISINOPRIL 20 MG TABS (LISINOPRIL) 1 1/2 daily (30mg ) MIRALAX  POWD (POLYETHYLENE GLYCOL 3350) daily or PRN SULINDAC 200 MG TABS (SULINDAC) 1 bid ALPRAZOLAM 0.25 MG TABS (ALPRAZOLAM) Take 1-2 tablet by mouth twice a day VIAGRA 100 MG TABS (SILDENAFIL CITRATE) Take 1/2-1 tablet by mouth once a day Current Allergies: ! * STATINS LIPITOR PRAVACHOL

## 2010-03-30 NOTE — Assessment & Plan Note (Signed)
Summary: 6 WK F/U DLO   Vital Signs:  Patient profile:   77 year old male Weight:      179.25 pounds Temp:     97.7 degrees F oral Pulse rate:   72 / minute Pulse rhythm:   regular BP sitting:   150 / 110  (left arm) Cuff size:   regular  Vitals Entered By: Sherrian Divers CMA Deborra Medina) (March 17, 2009 9:35 AM)  Serial Vital Signs/Assessments:  Time      Position  BP       Pulse  Resp  Temp     By           R Arm     152/100                        Claris Gower MD  CC: 6 week follow up   History of Present Illness: Several concerns  BP 160/80 at home off lisinopril  Having CTS and arthritic pain esp in left hand Still able to play tennis uses brace at night Uses different brace for tennis needs something to replace sulindac  Cough is alomst completely gone (95% gone) No SOB  Still has some fluid in his calves Calves feel funny Not sure if sensation might be from spinal stenosis Can run but only at half speed No leg pain  Voiding fair Still with frequency but tolerable  Allergies: 1)  ! * Statins 2)  Lipitor 3)  Pravachol  Past History:  Past medical, surgical, family and social histories (including risk factors) reviewed for relevance to current acute and chronic problems.  Past Medical History: Reviewed history from 01/26/2009 and no changes required. Diverticulosis, colon05/2005 GERD Hyperlipidemia Hypertension Benign prostatic hypertrophy Osteoarthritis, severe, B knees Renal insufficiency  Past Surgical History: Reviewed history from 09/03/2008 and no changes required. R 4th finger surgery Tonsils as child Lumbar spinal stenosis repair  06/2005 Bilateral total knee replacement, May 2010, Dr. Marry Guan  Family History: Reviewed history from 06/21/2006 and no changes required. Dad died @72  of "clogged arteries" Mom died @ 50 of old age, had HTN 1 brother died @24  of polia No prostate or colon cancer  Social History: Reviewed history from  09/03/2008 and no changes required. 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  Looks forward to tennis and bridge  Physical Exam  General:  alert and normal appearance.   Neck:  supple, no masses, no thyromegaly, no carotid bruits, and no cervical lymphadenopathy.   Lungs:  normal respiratory effort and normal breath sounds.   Heart:  normal rate, regular rhythm, no murmur, and no gallop.   Extremities:  no edema Psych:  normally interactive, good eye contact, not anxious appearing, and not depressed appearing.     Impression & Recommendations:  Problem # 1:  RENAL INSUFFICIENCY, ACUTE (ICD-585.9) Assessment Unchanged some better off sulindac and lisinopril will monitor  Labs Reviewed: BUN: 37 (02/13/2009)   Cr: 1.7 (02/13/2009)    Hgb: 13.0 (01/16/2009)   Hct: 38.4 (01/16/2009)   Ca++: 9.6 (02/13/2009)   Phos: 3.4 (02/13/2009) TP: 6.7 (  01/16/2009)   Alb: 4.1 (02/13/2009)  Problem # 2:  HYPERTENSION (ICD-401.9) Assessment: Deteriorated higher off lisinopril will add ziac  His updated medication list for this problem includes:    Hydrochlorothiazide 25 Mg Tabs (Hydrochlorothiazide) .Marland Kitchen... 1 daily    Furosemide 40 Mg Tabs (Furosemide) .Marland Kitchen... 1 tab daily for edema    Doxazosin Mesylate 4 Mg Tabs (Doxazosin mesylate) .Marland Kitchen... 1 tab daily for enlarged prostate    Bisoprolol-hydrochlorothiazide 2.5-6.25 Mg Tabs (Bisoprolol-hydrochlorothiazide) .Marland Kitchen... 1 daily for high blood pressure  BP today: 150/110 Prior BP: 148/70 (01/30/2009)  Labs Reviewed: K+: 3.9 (02/13/2009) Creat: : 1.7 (02/13/2009)   Chol:  166 (01/16/2009)   HDL: 44.50 (01/16/2009)   LDL: 111 (01/16/2009)   TG: 55.0 (01/16/2009)  Problem # 3:  OSTEOARTHRITIS (ICD-715.90) Assessment: Unchanged will try tylenol then tramadol \ His updated medication list for this problem includes:    Tramadol Hcl 50 Mg Tabs (Tramadol hcl) .Marland Kitchen... 1/2-1 tab by mouth three times a day as needed for severe pain  Problem # 4:  HYPERLIPIDEMIA (ICD-272.4) Assessment: Unchanged good control no changes needed  His updated medication list for this problem includes:    Simvastatin 40 Mg Tabs (Simvastatin) .Marland Kitchen... Take as directed  Labs Reviewed: SGOT: 27 (01/16/2009)   SGPT: 24 (01/16/2009)   HDL:44.50 (01/16/2009), 29.1 (07/24/2007)  LDL:111 (01/16/2009), 101 (07/24/2007)  Chol:166 (01/16/2009), 147 (07/24/2007)  Trig:55.0 (01/16/2009), 87 (07/24/2007)  Problem # 5:  GRIEF REACTION, ACUTE (ICD-309.0) Assessment: Improved looking for a new relationship  Complete Medication List: 1)  Proscar 5 Mg Tabs (Finasteride) .Marland Kitchen.. 1 daily 2)  Hydrochlorothiazide 25 Mg Tabs (Hydrochlorothiazide) .Marland Kitchen.. 1 daily 3)  Multivitamins Caps (Multiple vitamin) .Marland Kitchen.. 1 daily 4)  Fish Oil Caps (Omega-3 fatty acids caps) .... Takes otc 5)  Clonazepam 0.5 Mg Tabs (Clonazepam) .Marland Kitchen.. 1-2 at bedtime 6)  Simvastatin 40 Mg Tabs (Simvastatin) .... Take as directed 7)  Omeprazole 20 Mg Cpdr (Omeprazole) .... One by mouth 1/2 hour prior to breakfast. 8)  Mirapex 1 Mg Tabs (Pramipexole dihydrochloride) .Marland Kitchen.. 1-2 at bedtime for restless legs 9)  B Complex Vitamins Caps (B complex vitamins) .... Take 1 by mouth once daily 10)  Furosemide 40 Mg Tabs (Furosemide) .Marland Kitchen.. 1 tab daily for edema 11)  Zolpidem Tartrate 10 Mg Tabs (Zolpidem tartrate) .... Take 1 by mouth at bedtime as needed 12)  Doxazosin Mesylate 4 Mg Tabs (Doxazosin mesylate) .Marland Kitchen.. 1 tab daily for enlarged prostate 13)  Tramadol Hcl 50 Mg Tabs (Tramadol hcl) .... 1/2-1 tab by mouth three times a day as needed for severe  pain 14)  Bisoprolol-hydrochlorothiazide 2.5-6.25 Mg Tabs (Bisoprolol-hydrochlorothiazide) .Marland Kitchen.. 1 daily for high blood pressure  Patient Instructions: 1)  Please schedule a follow-up appointment in 2 months.  2)  Please try tylenol for joint pain--then use the tramadol if pain persists Prescriptions: BISOPROLOL-HYDROCHLOROTHIAZIDE 2.5-6.25 MG TABS (BISOPROLOL-HYDROCHLOROTHIAZIDE) 1 daily for high blood pressure  #30 x 12   Entered and Authorized by:   Claris Gower MD   Signed by:   Claris Gower MD on 03/17/2009   Method used:   Electronically to        [facility]* (retail)       [place], [place]  [address]       Ph: [phone]       Fax: [phone]   RxID:   [id number] TRAMADOL HCL 50 MG TABS (TRAMADOL HCL) 1/2-1  tab by mouth three times a day as needed for severe pain  #90 x 0   Entered and Authorized by:   Claris Gower MD   Signed by:   Claris Gower MD on 03/17/2009   Method used:   Electronically to        [facility]* (retail)       [place], [place]  [address]       Ph: [phone]       Fax: [phone]   RxID:   [id number]    Orders Added: 1)  Est. Patient Level IV [id number]   Current Allergies (reviewed today): ! * STATINS LIPITOR PRAVACHOL

## 2010-03-30 NOTE — Progress Notes (Signed)
Summary: Doxazosin Mesylate 4 Mg Tabs  Phone Note Call from Patient Call back at Home Phone [phone]   Caller: Patient Call For: Claris Gower MD Summary of Call: Patient takes Doxazosin Mesylate 4 Mg Tabs (Doxazosin mesylate) daily and get 30 pills per month and has 12 refills. He wants to know if he can get 90 pills per month because it is hard for him to get just  thirty every month.  Initial call taken by: Lacretia Nicks,  February 09, 2009 10:29 AM  Follow-up for Phone Call        spoke with pt and advised that rx was sent in for 90x3, pt also wanted to know if he could get the zostavax injection, I advised pt to check with his insurance and pharmacy to see what the cost would be and let us know if his insurance covered it or if it's cheaper at the pharmacy. Follow-up by: Edwin Dada CMA ([place]),  February 09, 2009 11:08 AM    Prescriptions: DOXAZOSIN MESYLATE 4 MG TABS (DOXAZOSIN MESYLATE) 1 tab daily for enlarged prostate  #90 x 3   Entered by:   Edwin Dada CMA ([place])   Authorized by:   Claris Gower MD   Signed by:   Edwin Dada CMA ([place]) on 02/09/2009   Method used:   Electronically to        [facility] (retail)       [place], [place]  [address]       Ph: [phone]       Fax: [phone]   RxID:   [id number]

## 2010-03-30 NOTE — Progress Notes (Signed)
Summary: Rx Mirapex  Phone Note From Pharmacy Call back at [phone]   Caller: [place] Call For: Dr. Silvio Pate  Summary of Call: Patient is changing pharmacies and they need a new rx for Mirapex #90 day supply. Initial call taken by: Emelia Salisbury LPN,  August 30, 624THL 9:18 AM  Follow-up for Phone Call        okay to send for 1 year Follow-up by: Claris Gower MD,  October 27, 2008 10:56 AM    Prescriptions: MIRAPEX 1 MG  TABS (PRAMIPEXOLE DIHYDROCHLORIDE) 1-2 at bedtime for restless legs  #180 x 3   Entered by:   Edwin Dada CMA ([place])   Authorized by:   Claris Gower MD   Signed by:   Edwin Dada CMA ([place]) on 10/27/2008   Method used:   Electronically to        [facility]* (retail)       [address] [place], [place]  [address]       Ph: [phone]       Fax: [phone]   RxID:   [id number]

## 2010-03-30 NOTE — Progress Notes (Signed)
Summary: regarding blood pressure medicine  Phone Note Call from Patient Call back at Home Phone [phone]   Caller: Patient Call For: Claris Gower MD Summary of Call: Pt states he stopped bisoprolol this morning because of side effects- weakness, headaches, drop in energy and stamina, lightheadedness, shortness of breath, rapid weight gain.  He went back to hctz but thinks he needs an additional blood pressure medicine.  He will continue to monitor his blood pressure.  Uses [facility] [employer].  He says he needs something that will not decrease his stamina, since he plays a lot of tennis. Initial call taken by: [name] CMA,  Jul 21, 2009 8:20 AM  Follow-up for Phone Call        Please adjust his med list back to the HCTZ  have him try increasing the doxazosin to 8mg  daily In addition to helping prostate, it lowers blood pressure Only issue could be increased risk of dizziness but usually not fatigue Have him call if he has problems with this Follow-up by: Claris Gower MD,  Jul 21, 2009 9:47 AM  Additional Follow-up for Phone Call Additional follow up Details #1::        spoke with patient he understands side effects and will call if any problems, he also didn't need any refill of HCTZ. Additional Follow-up by: Edwin Dada CMA Deborra Medina),  Jul 21, 2009 11:15 AM    New/Updated Medications: HYDROCHLOROTHIAZIDE 25 MG TABS (HYDROCHLOROTHIAZIDE) take 1 by mouth once daily

## 2010-03-30 NOTE — Assessment & Plan Note (Signed)
Summary: ?HERNIA/CLE   Vital Signs:  Patient profile:   77 year old male Height:      67.4 inches Weight:      179 pounds BMI:     27.80 Temp:     98.2 degrees F oral Pulse rate:   64 / minute Pulse rhythm:   regular BP sitting:   146 / 86  (left arm) Cuff size:   regular  Vitals Entered By: Ozzie Hoyle LPN (July 25, 624THL [id number] AM) CC: ?hernia   History of Present Illness: is concerned about hernia  played tennis tourney mon and tues  later in week bulge in L groin does come and go once felt hard  no pain whatsover  no pain at all when playing tennis does not feel a groin pull or change with position   Allergies: 1)  ! * Statins 2)  Lipitor 3)  Pravachol  Past History:  Past Medical History: Last updated: 01/26/2009 Diverticulosis, colon05/2005 GERD Hyperlipidemia Hypertension Benign prostatic hypertrophy Osteoarthritis, severe, B knees Renal insufficiency  Past Surgical History: Last updated: 05/19/2009 R 4th finger surgery Tonsils as child Lumbar spinal stenosis repair  06/2005 Bilateral total knee replacement, May 2010, Dr. 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 Eyes:  Denies blurring and eye irritation. CV:  Denies chest pain or discomfort and palpitations. Resp:  Denies cough, shortness of breath, and wheezing. GI:  Denies abdominal pain, bloody stools, change in bowel habits, indigestion, nausea, and vomiting. GU:  Denies discharge, dysuria, hematuria, incontinence, and urinary hesitancy. MS:  Denies  joint pain, joint redness, and joint swelling. Derm:  Denies poor wound healing and rash. Endo:  Denies excessive thirst and excessive urination.  Physical Exam  General:  Well-developed,well-nourished,in no acute distress; alert,appropriate and cooperative throughout examination Head:  no sinus tenderness Mouth:  pharynx pink and moist.   Neck:  supple with full rom and no masses or thyromegally, no JVD or carotid bruit  Lungs:  normal respiratory effort and normal breath sounds.   Heart:  normal rate, regular rhythm, no murmur, and no gallop.   Abdomen:  no suprapubic tenderness or fullness felt  Genitalia:  bulge in L groin just over testicle  this is soft and reducible - without tenderness inguinal hernia also noted on testicular exam  nl ext genetalia  Extremities:  no sig edema Skin:  Intact without suspicious lesions or rashes Cervical Nodes:  No lymphadenopathy noted Inguinal Nodes:  No significant adenopathy Psych:  normal affect, talkative and pleasant    Impression & Recommendations:  Problem # 1:  INGUINAL HERNIA, LEFT (ICD-550.90) Assessment New after playing tennis - is palpable and reducible  will ref to general surgeon asap  adv to avoid straining - incl tennis or with BM (use stool softener if needed)  Orders: Surgical Referral (Surgery)  Complete Medication List: 1)  Proscar 5 Mg Tabs (Finasteride) .Marland Kitchen.. 1 daily 2)  Clonazepam 0.5 Mg Tabs (Clonazepam) .Marland Kitchen.. 1-2 at bedtime 3)  Simvastatin 40 Mg Tabs (Simvastatin) .... Take 1/2 by mouth once daily 4)  Mirapex 1 Mg Tabs (Pramipexole  dihydrochloride) .Marland Kitchen.. 1-2 at bedtime for restless legs 5)  Furosemide 40 Mg Tabs (Furosemide) .Marland Kitchen.. 1 tab daily  as needed for edema 6)  Zolpidem Tartrate 10 Mg Tabs (Zolpidem tartrate) .... Take 1 by mouth at bedtime as needed 7)  Doxazosin Mesylate 4 Mg Tabs (Doxazosin mesylate) .Marland Kitchen.. 1& 1/2 tab daily for enlarged prostate 8)  Potassium Chloride 20 Meq Pack (Potassium chloride) ....  Take 1 by mouth once daily when taking furosemide 9)  Hydrochlorothiazide 25 Mg Tabs (Hydrochlorothiazide) .... Take 1 by mouth once daily 10)  Omeprazole 20 Mg Cpdr (Omeprazole) .... One by mouth daily as needed for acid reflux symptoms 11)  Multivitamins Caps (Multiple vitamin) .Marland Kitchen.. 1 daily 12)  Fish Oil Caps (Omega-3 fatty acids caps) .... Takes otc 58)  Zinc-c-b6 15-60-5 Mg Lozg (Zinc-c-b6) .... Take 1 by mouth once daily 14)  Hernia Belt (with Pads If Needed)  .... To wear daily for left inguinal hernia 550.90  Patient Instructions: 1)  we will do surgical referral for hernia at check out  2)  please avoid straining as much as possible 3)  if you develop pain or persistant bulge that is enlarging - please alert me or go to ER Prescriptions: HERNIA BELT (WITH PADS IF NEEDED) to wear daily for left inguinal hernia 550.90  #1 x 0   Entered and Authorized by:   Allena Earing MD   Signed by:   Allena Earing MD on 09/23/2009   Method used:   Printed then faxed to ...       [facility] (retail)       [place], [place]  [address]       Ph: [phone]       Fax: [phone]   RxID:   [id number]   Current Allergies (reviewed today): ! * STATINS LIPITOR PRAVACHOL

## 2010-03-30 NOTE — Progress Notes (Signed)
Summary: rx( fyi)  Phone Note Refill Request Call back at Home Phone [phone] Message from:  Patient on October 23, 2006 9:24 AM  Refills Requested: Medication #1:  PROSCAR 5 MG TABS 1 daily [facility]  is to call and request rx. he wants quantity changed from 30 to # 90  Initial call taken by: Daralene Milch,  October 23, 2006 9:25 AM Caller: Patient Call For: [place] Initial call taken by: Daralene Milch,  October 23, 2006 9:24 AM  Follow-up for Phone Call        okay to refill #90 x 3 Follow-up by: Claris Gower MD,  October 23, 2006 9:40 AM

## 2010-03-30 NOTE — Progress Notes (Signed)
Summary: rx vicodan for dr.letvak  Phone Note Refill Request Message from:  Fax from Pharmacy on August 30, 2007 9:05 AM  Refills Requested: Medication #1:  VICODIN 5-500 MG  TABS 1/2--1 tab three times a day as needed for severe arthritis pain   Last Refilled: 12/12/2006 [facility] [id number]  Initial call taken by: Gwinda Maine,  August 30, 2007 9:06 AM  Follow-up for Phone Call        prescription faxed to pharmacy Follow-up by: Gwinda Maine,  August 30, 2007 10:01 AM      Prescriptions: VICODIN 5-500 MG  TABS (HYDROCODONE-ACETAMINOPHEN) 1/2--1 tab three times a day as needed for severe arthritis pain  #90 x 0   Entered and Authorized by:   Raenette Rover MD   Signed by:   Raenette Rover MD on 08/30/2007   Method used:   Printed then faxed to ...       [id number]       [place], [place]  [address]       Ph: [phone]       Fax: [phone]   RxID:   [id number]  Raenette Rover MD  August 30, 2007 9:18 AM

## 2010-03-30 NOTE — Letter (Signed)
Summary: [facility]   Imported By: Edmonia James 08/05/2008 12:36:24  _____________________________________________________________________  External Attachment:    Type:   Image     Comment:   External Document  Appended Document: [facility] episode of hypotension delayed discharge [place]ology consulted echo done recovered okay

## 2010-03-30 NOTE — Assessment & Plan Note (Signed)
Summary: F/U/BIR   Vital Signs:  Patient profile:   77 year old male Weight:      168 pounds Temp:     98.2 degrees F oral Pulse rate:   70 / minute Pulse rhythm:   regular BP sitting:   140 / 80  (left arm) Cuff size:   regular  Vitals Entered By: Edwin Dada CMA (October 03, 2008 9:28 AM)  History of Present Illness: Chief Complaint: follow-up  Vascular eval was negative duplex fine by his report They felt his edema was post-op due to TKR  Did have bad pain one night--needed a pain pill Usually only uncomfortable Hasn't been able to get hose on--discussed the wire frames to help apply hose  Cough is better No SOB now  Is having trouble sleeping doesn't want to take pain pills to sleep wants to try something to help him sleep  Started sulindac concerned about stomach though He does take prilosec This helps arthritic pain--worst in thumbs Hopes to get back to tennis in a month  Allergies: 1)  ! * Statins 2)  Lipitor 3)  Pravachol  Past History:  Past medical, surgical, family and social histories (including risk factors) reviewed for relevance to current acute and chronic problems.  Past Medical History: Reviewed history from 02/04/2008 and no changes required. Diverticulosis, colon05/2005 GERD Hyperlipidemia Hypertension Benign prostatic hypertrophy Osteoarthritis, severe, B knees  Past Surgical History: Reviewed history from 09/03/2008 and no changes required. R 4th finger surgery Tonsils as child Lumbar spinal stenosis repair  06/2005 Bilateral total knee replacement, May 2010, Dr. Marry Guan  Family History: Reviewed history from 06/21/2006 and no changes required. Dad died @72  of "clogged arteries" Mom died @ 15 of old age, had HTN 1 brother died @24  of polia No prostate or colon cancer  Social History: Reviewed history from 09/03/2008 and no changes required. 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   Neck:  supple and no masses.   Lungs:  normal respiratory effort and normal breath sounds.   Heart:  normal rate, regular rhythm, no murmur, and no gallop.   Msk:  no joint tenderness and no joint swelling.   Extremities:  slight foot edema without pitting Calves are okay Psych:  normally interactive, good eye contact, not anxious appearing, and not depressed appearing.     Impression & Recommendations:  Problem # 1:  SLEEP DISORDER (ICD-780.50) Assessment New multiple stressors but doing okay in daytime will try trazodone  Problem # 2:  EDEMA- LOCALIZED (ICD-782.3) Assessment: Improved discussed trying to use hose again  His updated medication list for this problem includes:    Hydrochlorothiazide 25 Mg Tabs (Hydrochlorothiazide) .Marland Kitchen... 1 daily    Furosemide 40 Mg Tabs (Furosemide) .Marland Kitchen... 1 tab daily for edema  Problem # 3:  OSTEOARTHRITIS (ICD-715.90) Assessment: Improved using the sulindac or other NSAID does take prilosec  His updated medication list for this problem includes:    Oxycodone Hcl 5 Mg Tabs (Oxycodone hcl) .Marland Kitchen... 1 tab three times a day as needed for severe pain  Complete Medication List: 1)  Proscar 5 Mg Tabs (Finasteride) .Marland Kitchen.. 1 daily 2)  Hydrochlorothiazide 25 Mg Tabs (Hydrochlorothiazide) .Marland Kitchen.. 1 daily 3)  Multivitamins Caps (Multiple vitamin) .Marland Kitchen.. 1 daily 4)  Fish  Oil Caps (Omega-3 fatty acids caps) .... Takes otc 5)  Clonazepam 0.5 Mg Tabs (Clonazepam) .Marland Kitchen.. 1-2 at bedtime 6)  Simvastatin 40 Mg Tabs (Simvastatin) .... Take as directed 7)  Lisinopril 40 Mg Tabs (Lisinopril) .... Take 1/2 tablet by mouth once daily 8)  Omeprazole 20 Mg Cpdr (Omeprazole) .... One by mouth 1/2 hour prior to breakfast. 9)  Mirapex 1 Mg Tabs (Pramipexole dihydrochloride) .Marland Kitchen.. 1-2  at bedtime for restless legs 10)  Oxycodone Hcl 5 Mg Tabs (Oxycodone hcl) .Marland Kitchen.. 1 tab three times a day as needed for severe pain 11)  B Complex Vitamins Caps (B complex vitamins) .... Take 1 by mouth once daily 12)  Cardura 2 Mg Tabs (Doxazosin mesylate) .... Take 1 by mouth once daily 13)  Furosemide 40 Mg Tabs (Furosemide) .Marland Kitchen.. 1 tab daily for edema 14)  Trazodone Hcl 50 Mg Tabs (Trazodone hcl) .Marland Kitchen.. 1-2 tabs at bedtime as needed to help sleep  Patient Instructions: 1)  Please schedule a follow-up appointment in 4 months .  Prescriptions: TRAZODONE HCL 50 MG TABS (TRAZODONE HCL) 1-2 tabs at bedtime as needed to help sleep  #60 x 3   Entered and Authorized by:   Claris Gower MD   Signed by:   Claris Gower MD on 10/03/2008   Method used:   Electronically to        [facility]* (retail)       [place], [place]  [address]       Ph: [phone]       Fax: [phone]   RxID:   [id number]   Current Allergies (reviewed today): ! * STATINS LIPITOR PRAVACHOL

## 2010-03-30 NOTE — Progress Notes (Signed)
Summary: rx clonazepam  Phone Note Refill Request Message from:  [facility] on February 11, 2008 4:15 PM  Refills Requested: Medication #1:  CLONAZEPAM 0.5 MG TABS 1-2 at bedtime  Medication #2:  LISINOPRIL 40 MG  TABS Take 1/2 tablet by mouth once daily rx states last refilled at [facility] 90 days ago, form in your inbox   Method Requested: Fax to [facility] Initial call taken by: Edwin Dada CMA,  February 11, 2008 4:16 PM  Follow-up for Phone Call        please fax the clonazepam lisinopril done med list updated Follow-up by: Claris Gower MD,  February 11, 2008 5:37 PM  Additional Follow-up for Phone Call Additional follow up Details #1::        rx's faxed to pharmacy Additional Follow-up by: Edwin Dada CMA,  February 11, 2008 5:45 PM    New/Updated Medications: CLONAZEPAM 0.5 MG TABS (CLONAZEPAM) 1-2 at bedtime LISINOPRIL 40 MG  TABS (LISINOPRIL) Take 1/2 tablet by mouth once daily   Prescriptions: LISINOPRIL 40 MG  TABS (LISINOPRIL) Take 1/2 tablet by mouth once daily  #45 x 3   Entered and Authorized by:   Claris Gower MD   Signed by:   Claris Gower MD on 02/11/2008   Method used:   Electronically to        [facility] (retail)       [place], [place]  [address]       Ph: [phone]       Fax: [phone]   RxID:   [id number] CLONAZEPAM 0.5 MG TABS (CLONAZEPAM) 1-2 at bedtime  #180 x 0   Entered and Authorized by:   Claris Gower MD   Signed by:   Claris Gower MD on 02/11/2008   Method used:   Handwritten   [id number]

## 2010-03-30 NOTE — Progress Notes (Signed)
Summary: clonazepam  Phone Note Refill Request Message from:  Fax from Pharmacy on February 01, 2010 10:39 AM  Refills Requested: Medication #1:  CLONAZEPAM 0.5 MG TABS 1-2 at bedtime   Last Refilled: 11/03/2009 Refill request from [facility]. [id number]. Fax is on your desk.   Initial call taken by: Lacretia Nicks,  February 01, 2010 10:40 AM  Follow-up for Phone Call        okay #180 x 1 Follow-up by: Claris Gower MD,  February 01, 2010 1:52 PM  Additional Follow-up for Phone Call Additional follow up Details #1::        Rx faxed to pharmacy Additional Follow-up by: DeShannon Smith CMA Deborra Medina),  February 01, 2010 2:18 PM    Prescriptions: CLONAZEPAM 0.5 MG TABS (CLONAZEPAM) 1-2 at bedtime  #180 x 1   Entered by:   Edwin Dada CMA ([place])   Authorized by:   Claris Gower MD   Signed by:   Edwin Dada CMA ([place]) on 02/01/2010   Method used:   Handwritten   [id number]

## 2010-03-30 NOTE — Consult Note (Signed)
Summary: Dr.Gramig,Ortho,Note  Dr.Gramig,Ortho,Note   Imported By: Virgia Land 05/28/2007 10:20:53  _____________________________________________________________________  External Attachment:    Type:   Image     Comment:   External Document

## 2010-03-30 NOTE — Assessment & Plan Note (Signed)
Summary: HEADACHES AND DIZZY/DLO   Vital Signs:  Patient profile:   77 year old male Weight:      180 pounds BMI:     27.96 Temp:     97.5 degrees F oral Pulse rate:   60 / minute Pulse (ortho):   60 / minute Pulse rhythm:   regular BP sitting:   160 / 70  (left arm) BP standing:   136 / 80 Cuff size:   large  Vitals Entered By: Edwin Dada CMA Deborra Medina) (Jul 07, 2009 9:16 AM)  Serial Vital Signs/Assessments:  Time      Position  BP       Pulse  Resp  Temp     By           Lying LA  144/86   60                    Claris Gower MD           Standing  136/80   60                    Claris Gower MD  CC: headaches   History of Present Illness: soon after awakening, he develops headache and dizziness goes back a couple of months checked with Dr Thomasene Ripple and he didn't feel it was related to cataract surgery Symptoms often before eating or taking meds eating didn't change it this AM  Eventually just disapates--maybe completely after a couple of hours Notices shortly after getting out of bed doesn't recognize any orthostatic symptoms at other times  No vertigo No tinnitus  Allergies: 1)  ! * Statins 2)  Lipitor 3)  Pravachol  Past History:  Past medical, surgical, family and social histories (including risk factors) reviewed for relevance to current acute and chronic problems.  Past Medical History: Reviewed history from 01/26/2009 and no changes required. Diverticulosis, colon05/2005 GERD Hyperlipidemia Hypertension Benign prostatic hypertrophy Osteoarthritis, severe, B knees Renal insufficiency  Past Surgical History: Reviewed history from 05/19/2009 and no changes required. R 4th finger surgery Tonsils as child Lumbar spinal stenosis repair  06/2005 Bilateral total knee replacement, May 2010, Dr. Marry Guan Cataracts--bilateral 2011  Dr Dingledein  Family History: Reviewed history from 06/21/2006 and no changes required. Dad died @72  of  "clogged arteries" Mom died @ 69 of old age, had HTN 1 brother died @24  of polia No prostate or colon cancer  Social History: Reviewed history from 09/03/2008 and no changes required. 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         still playing tennis---now singles!! had unprotected sex about 2 months ago. Discussed safe sex. He has ordered a home HIV test to do. He will call if abnormal  Physical Exam  General:  alert and normal appearance.   Neck:  supple, no masses, no thyromegaly, no carotid bruits, and no cervical lymphadenopathy.   Lungs:  normal respiratory effort and normal breath sounds.   Heart:  normal rate, regular rhythm, and no gallop.   Gr 2/6 aortic systolic murmur Extremities:  no edema Psych:  normally interactive, good eye contact, not anxious appearing, and not depressed appearing.     Impression & Recommendations:  Problem # 1:  DIZZINESS (ICD-780.4) Assessment New not clearly orthostatic but likely related to all  his meds nothing to suggest vestibular etiology or cardiac  doing better in general so may not need as much meds will stop HCTZ make lasix as needed only  Complete Medication List: 1)  Proscar 5 Mg Tabs (Finasteride) .Marland Kitchen.. 1 daily 2)  Clonazepam 0.5 Mg Tabs (Clonazepam) .Marland Kitchen.. 1-2 at bedtime 3)  Simvastatin 40 Mg Tabs (Simvastatin) .... Take as directed 4)  Omeprazole 20 Mg Cpdr (Omeprazole) .... One by mouth 1/2 hour prior to breakfast. 5)  Mirapex 1 Mg Tabs (Pramipexole dihydrochloride) .Marland Kitchen.. 1-2 at bedtime for restless legs 6)  Furosemide 40 Mg Tabs (Furosemide) .Marland Kitchen.. 1 tab daily  as needed for edema 7)  Zolpidem Tartrate 10 Mg Tabs (Zolpidem tartrate) .... Take 1 by mouth at bedtime as needed 8)  Doxazosin Mesylate 4 Mg Tabs (Doxazosin mesylate) .Marland Kitchen..  1 tab daily for enlarged prostate 9)  Bisoprolol-hydrochlorothiazide 2.5-6.25 Mg Tabs (Bisoprolol-hydrochlorothiazide) .Marland Kitchen.. 1 daily for high blood pressure 10)  Potassium Chloride 20 Meq Pack (Potassium chloride) .... Take 1 by mouth once daily when taking furosemide 11)  Multivitamins Caps (Multiple vitamin) .Marland Kitchen.. 1 daily 12)  Fish Oil Caps (Omega-3 fatty acids caps) .... Takes otc 4)  Zinc-c-b6 15-60-5 Mg Lozg (Zinc-c-b6) .... Take 1 by mouth once daily  Patient Instructions: 1)  Please stop the HCTZ 2)  Take furosemide and potassium only on mornings when you have significant swelling in your legs 3)  Please schedule a follow-up appointment in 4-6 weeks.   Current Allergies (reviewed today): ! * STATINS LIPITOR PRAVACHOL

## 2010-03-30 NOTE — Assessment & Plan Note (Signed)
Summary: 6 M F/U DLO   Vital Signs:  Patient profile:   77 year old male Weight:      177 pounds Temp:     97.5 degrees F oral Pulse rate:   74 / minute Pulse rhythm:   regular BP sitting:   150 / 80  (left arm) Cuff size:   large  Vitals Entered By: Edwin Dada CMA Deborra Medina) (January 16, 2009 8:04 AM)  History of Present Illness: Chief Complaint: 4 month follow-up  Knees are better has restarted tennis--even running on the court sometimes No pain--night or day back on sulindac for general arthritis taking vitamin B6 as well  Sleeping better Has ambien to use 1-2 per night Generally only needs 4-5 hours at night and then naps  Having persistent mild cough not sick Ongoing for some time No SOB No heartburn on prilosec This is not that bothersome  still with some edema in legs uses support stockings occ  Most pressing issue is voiding Has frequency but this is tolerable Notes incontinence if he doesn't pay attention to needing to go--as often as two times a day  Keeps urinal in car and pulls over as needed  Nocturia every 2 hours  Allergies: 1)  ! * Statins 2)  Lipitor 3)  Pravachol  Past History:  Past medical, surgical, family and social histories (including risk factors) reviewed for relevance to current acute and chronic problems.  Past Medical History: Reviewed history from 02/04/2008 and no changes required. Diverticulosis, colon05/2005 GERD Hyperlipidemia Hypertension Benign prostatic hypertrophy Osteoarthritis, severe, B knees  Past Surgical History: Reviewed history from 09/03/2008 and no changes required. R 4th finger surgery Tonsils as child Lumbar spinal stenosis repair  06/2005 Bilateral total knee replacement, May 2010, Dr. Marry Guan  Family History: Reviewed history from 06/21/2006 and no changes required. Dad died @72  of "clogged arteries" Mom died @ 105 of old age, had HTN 1 brother died @24  of polia No prostate or colon  cancer  Social History: Reviewed history from 09/03/2008 and no changes required. 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         No dizziness appetite is very strong--has gained 9#  Physical Exam  General:  alert and normal appearance.   Neck:  supple, no masses, no thyromegaly, no carotid bruits, and no cervical lymphadenopathy.   Lungs:  normal respiratory effort and normal breath sounds.   Heart:  normal rate, regular rhythm, and no gallop.   Very soft aortic systolic murmur Abdomen:  soft and non-tender.  No suprapubic dullness Pulses:  faint in feet Extremities:  trace edema Psych:  normally interactive, good eye contact, not anxious appearing, and not depressed appearing.  Briefly tearful when talking about wife   Impression & Recommendations:  Problem # 1:  BENIGN PROSTATIC HYPERTROPHY (ICD-600.00) Assessment Deteriorated symptoms have worsened will increase doxazosin discussed watching for dizziness  Problem # 2:  SLEEP DISORDER (ICD-780.50) Assessment: Improved only rarely needs ambien  Problem # 3:  HYPERTENSION (ICD-401.9) Assessment: Unchanged  generally reasonable control if cough worsens, will switch lisinopril to losartan check labs today  The following medications were removed from the medication list:    Cardura 2 Mg Tabs (Doxazosin mesylate) .Marland Kitchen... Take 1 by mouth once daily His updated medication list for this problem includes:    Hydrochlorothiazide 25 Mg Tabs (Hydrochlorothiazide) .Marland KitchenMarland KitchenMarland KitchenMarland Kitchen  1 daily    Lisinopril 40 Mg Tabs (Lisinopril) .Marland Kitchen... Take 1/2 tablet by mouth once daily    Furosemide 40 Mg Tabs (Furosemide) .Marland Kitchen... 1 tab daily for edema    Doxazosin Mesylate 4 Mg Tabs (Doxazosin mesylate) .Marland Kitchen... 1 tab daily for enlarged prostate  BP today: 150/80 Prior BP: 140/80  (10/03/2008)  Labs Reviewed: K+: 4.3 (09/17/2008) Creat: : 1.3 (09/17/2008)   Chol: 147 (07/24/2007)   HDL: 29.1 (07/24/2007)   LDL: 101 (07/24/2007)   TG: 87 (07/24/2007)  Orders: TLB-Renal Function Panel (80069-RENAL) TLB-CBC Platelet - w/Differential (85025-CBCD) TLB-TSH (Thyroid Stimulating Hormone) (84443-TSH) Venipuncture [id number])  Problem # 4:  HYPERLIPIDEMIA (ICD-272.4) Assessment: Unchanged  no myalgias due for labs  His updated medication list for this problem includes:    Simvastatin 40 Mg Tabs (Simvastatin) .Marland Kitchen... Take as directed  Labs Reviewed: SGOT: 22 (09/03/2008)   SGPT: 20 (09/03/2008)   HDL:29.1 (07/24/2007), 36.5 (12/12/2006)  LDL:101 (07/24/2007), 113 (12/12/2006)  Chol:147 (07/24/2007), 172 (12/12/2006)  Trig:87 (07/24/2007), 115 (12/12/2006)  Orders: TLB-Lipid Panel (80061-LIPID) TLB-Hepatic/Liver Function Pnl (80076-HEPATIC)  Problem # 5:  GRIEF REACTION, ACUTE (ICD-309.0) Assessment: Improved still grieving but is much better  Complete Medication List: 1)  Proscar 5 Mg Tabs (Finasteride) .Marland Kitchen.. 1 daily 2)  Hydrochlorothiazide 25 Mg Tabs (Hydrochlorothiazide) .Marland Kitchen.. 1 daily 3)  Multivitamins Caps (Multiple vitamin) .Marland Kitchen.. 1 daily 4)  Fish Oil Caps (Omega-3 fatty acids caps) .... Takes otc 5)  Clonazepam 0.5 Mg Tabs (Clonazepam) .Marland Kitchen.. 1-2 at bedtime 6)  Simvastatin 40 Mg Tabs (Simvastatin) .... Take as directed 7)  Lisinopril 40 Mg Tabs (Lisinopril) .... Take 1/2 tablet by mouth once daily 8)  Omeprazole 20 Mg Cpdr (Omeprazole) .... One by mouth 1/2 hour prior to breakfast. 9)  Mirapex 1 Mg Tabs (Pramipexole dihydrochloride) .Marland Kitchen.. 1-2 at bedtime for restless legs 10)  B Complex Vitamins Caps (B complex vitamins) .... Take 1 by mouth once daily 11)  Furosemide 40 Mg Tabs (Furosemide) .Marland Kitchen.. 1 tab daily for edema 12)  Zolpidem Tartrate 10 Mg Tabs (Zolpidem tartrate) .... Take 1 by mouth at bedtime as needed 13)  Sulindac 150 Mg Tabs (Sulindac) .... Take 1 by  mouth once daily as needed 14)  Doxazosin Mesylate 4 Mg Tabs (Doxazosin mesylate) .Marland Kitchen.. 1 tab daily for enlarged prostate  Other Orders: Flu Vaccine 82yrs + [id number]) Admin 1st Vaccine [phone]) Admin 1st Vaccine [facility]) [id number])  Patient Instructions: 1)  Please increase the doxazosin to 4mg  daily 2)  Please schedule a follow-up appointment in 4 months .  Prescriptions: DOXAZOSIN MESYLATE 4 MG TABS (DOXAZOSIN MESYLATE) 1 tab daily for enlarged prostate  #30 x 12   Entered and Authorized by:   Claris Gower MD   Signed by:   Claris Gower MD on 01/16/2009   Method used:   Electronically to        [facility]* (retail)       [place], [place]  [address]       Ph: [phone]       Fax: [phone]   RxID:   [id number]   Current Allergies (reviewed today): ! * STATINS LIPITOR PRAVACHOL   Influenza Vaccine    Vaccine Type: Fluvax 3+    Site: left deltoid    Mfr: [employer]    Dose: 0.5 ml    Route: IM    Given by: Edwin Dada CMA ([place])  Exp. Date: 08/27/2009    Lot #: [id number]    VIS given: 09/21/06 version given January 16, 2009.  Flu Vaccine Consent Questions    Do you have a history of severe allergic reactions to this vaccine? no    Any prior history of allergic reactions to egg and/or gelatin? no    Do you have a sensitivity to the preservative Thimersol? no    Do you have a past history of Guillan-Barre Syndrome? no    Do you currently have an acute febrile illness? no    Have you ever had a severe reaction to latex? no    Vaccine information given and explained to patient? yes

## 2010-03-30 NOTE — Assessment & Plan Note (Signed)
Summary: COUGH/CLE   Vital Signs:  Patient profile:   77 year old male Weight:      176 pounds Temp:     97.7 degrees F oral BP sitting:   148 / 70  (left arm) Cuff size:   large  Vitals Entered By: Edwin Dada CMA Deborra Medina) (January 30, 2009 11:47 AM) CC: cough, SOB   History of Present Illness: Cough persists had slight cough that goes back for a while seemed to be improving but then had a severe bout last night felt tightness in throat has sensation of something in throat  No fever No sig SOB--except with severe coughing spell No sore throat  No problems with blood pressure no chest pain Slight headache with the coughing  off the sulindac now creat didn't go back down  Allergies: 1)  ! * Statins 2)  Lipitor 3)  Pravachol  Past History:  Past medical, surgical, family and social histories (including risk factors) reviewed for relevance to current acute and chronic problems.  Past Medical History: Reviewed history from 01/26/2009 and no changes required. Diverticulosis, colon05/2005 GERD Hyperlipidemia Hypertension Benign prostatic hypertrophy Osteoarthritis, severe, B knees Renal insufficiency  Past Surgical History: Reviewed history from 09/03/2008 and no changes required. R 4th finger surgery Tonsils as child Lumbar spinal stenosis repair  06/2005 Bilateral total knee replacement, May 2010, Dr. Marry Guan  Family History: Reviewed history from 06/21/2006 and no changes required. Dad died @72  of "clogged arteries" Mom died @ 64 of old age, had HTN 1 brother died @24  of polia No prostate or colon cancer  Social History: Reviewed history from 09/03/2008 and no changes required. 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    Mouth:  no erythema and no exudates.   Neck:  supple, no masses, and no cervical lymphadenopathy.   Lungs:  normal respiratory effort, normal breath sounds, no crackles, and no wheezes.     Impression & Recommendations:  Problem # 1:  COUGH (ICD-786.2) Assessment Deteriorated bad spell last night not clearly infectious but may be will go ahead and treat with z-pak stop the lisinopril since that may be related  Problem # 2:  RENAL INSUFFICIENCY (ICD-588.9) Assessment: Comment Only creat still 2 even off the sulindac (though only off 1 week before the repeat tests) will keep off the lisinopril recheck labs in 2 weeks consider adding losartan  Problem # 3:  HYPERTENSION (ICD-401.9) Assessment: Unchanged will stay off the lisinopril recheck in 6 weeks and consider adding meds if needed  The following medications were removed from the medication list:    Lisinopril 40 Mg Tabs (Lisinopril) .Marland Kitchen... Take 1/2 tablet by mouth once daily His updated medication list for this problem includes:    Hydrochlorothiazide 25 Mg Tabs (Hydrochlorothiazide) .Marland Kitchen... 1 daily    Furosemide 40 Mg Tabs (Furosemide) .Marland Kitchen... 1 tab daily for edema    Doxazosin Mesylate 4 Mg Tabs (Doxazosin mesylate) .Marland Kitchen... 1 tab daily for enlarged prostate  BP today: 148/70 Prior BP: 170/100 (01/26/2009)  Labs Reviewed: K+: 3.9 (01/26/2009) Creat: : 2.0 (01/26/2009)   Chol: 166 (01/16/2009)   HDL: 44.50 (01/16/2009)   LDL: 111 (01/16/2009)   TG: 55.0 (01/16/2009)  Complete Medication List: 1)  Proscar 5 Mg Tabs (Finasteride) .Marland Kitchen.. 1 daily 2)  Hydrochlorothiazide 25 Mg Tabs (Hydrochlorothiazide) .Marland Kitchen.. 1 daily 3)  Multivitamins Caps (Multiple vitamin) .Marland Kitchen.. 1 daily 4)  Fish Oil Caps (Omega-3 fatty acids caps) .... Takes otc 5)  Clonazepam 0.5 Mg Tabs (Clonazepam) .Marland Kitchen.. 1-2 at bedtime 6)  Simvastatin 40 Mg Tabs (Simvastatin) .... Take as directed 7)  Omeprazole 20 Mg Cpdr (Omeprazole) .... One by mouth 1/2 hour prior to  breakfast. 8)  Mirapex 1 Mg Tabs (Pramipexole dihydrochloride) .Marland Kitchen.. 1-2 at bedtime for restless legs 9)  B Complex Vitamins Caps (B complex vitamins) .... Take 1 by mouth once daily 10)  Furosemide 40 Mg Tabs (Furosemide) .Marland Kitchen.. 1 tab daily for edema 11)  Zolpidem Tartrate 10 Mg Tabs (Zolpidem tartrate) .... Take 1 by mouth at bedtime as needed 12)  Doxazosin Mesylate 4 Mg Tabs (Doxazosin mesylate) .Marland Kitchen.. 1 tab daily for enlarged prostate 13)  Zithromax 250 Mg Tabs (Azithromycin) .... 2 tabs by mouth today, then 1 tab daily for the following 4 days 14)  Hydrocodone-homatropine 5-1.5 Mg/24ml Syrp (Hydrocodone-homatropine) .Marland Kitchen.. 1-2 teaspoons by mouth three times a day as needed for severe cough  Patient Instructions: 1)  Please schedule a follow-up appointment in 6 weeks. Cancel other appointments 2)  Return in 2 weeks for renal profile (588.9) Prescriptions: HYDROCODONE-HOMATROPINE 5-1.5 MG/5ML SYRP (HYDROCODONE-HOMATROPINE) 1-2 teaspoons by mouth three times a day as needed for severe cough  #8oz x 0   Entered and Authorized by:   Claris Gower MD   Signed by:   Claris Gower MD on 01/30/2009   Method used:   Print then Give to Patient   RxID:   [id number] ZITHROMAX 250 MG TABS (AZITHROMYCIN) 2 tabs by mouth today, then 1 tab daily for the following 4 days  #6 x 0   Entered and Authorized by:   Claris Gower MD   Signed by:   Claris Gower MD on 01/30/2009   Method used:   Print then Give to Patient   RxID:   [id number]   Current Allergies (reviewed today): ! * STATINS LIPITOR PRAVACHOL

## 2010-03-30 NOTE — Letter (Signed)
Summary: Consult/[facility]   Imported By: Edmonia James 07/25/2008 10:39:14  _____________________________________________________________________  External Attachment:    Type:   Image     Comment:   External Document  Appended Document: Consult/[facility] change in mental status post op from bilateral TKRs probably narcotic related since he improved with narcan adjusting BP meds for now

## 2010-03-30 NOTE — Progress Notes (Signed)
Summary: high bp  Medications Added ALPRAZOLAM 0.25 MG TABS (ALPRAZOLAM) Take 1-2 tablet by mouth twice a day VIAGRA 100 MG TABS (SILDENAFIL CITRATE) Take 1/2-1 tablet by mouth once a day       Phone Note Call from Patient Call back at Home Phone [phone]   Caller: Patient Call For: letvak Summary of Call: c/o hx of htn, got dizzy for past few days, checked bp @ pharmacy and it was 107/55, he's on hctz 25, doxazosin 8mg , and lisinopril 40 mg. pt says this is rare for him but he has lately been sensitive to meds which one should he d/c Initial call taken by: Daralene Milch,  Jul 17, 2006 9:39 AM  Follow-up for Phone Call        please have him cut lisinopril in half. Don't completely stop anything. If continues to be dizzy, should make an appt Follow-up by: Claris Gower MD,  Jul 17, 2006 11:03 AM  Additional Follow-up for Phone Call Additional follow up Details #1::        PATIENT ADVISED ..................................................................Marland KitchenRonny Bacon Mitcheal Sweetin  Jul 17, 2006 11:19 AM   New/Updated Medications: ALPRAZOLAM 0.25 MG TABS (ALPRAZOLAM) Take 1-2 tablet by mouth twice a day VIAGRA 100 MG TABS (SILDENAFIL CITRATE) Take 1/2-1 tablet by mouth once a day  New/Updated Medications: ALPRAZOLAM 0.25 MG TABS (ALPRAZOLAM) Take 1-2 tablet by mouth twice a day VIAGRA 100 MG TABS (SILDENAFIL CITRATE) Take 1/2-1 tablet by mouth once a day

## 2010-03-30 NOTE — Progress Notes (Signed)
Summary: stopped prilosec  Phone Note Call from Patient Call back at Home Phone [phone]   Caller: Patient Call For: Stephen Gower MD Summary of Call: Pt states he stopped taking prilosec about a week ago and has not had any problems.  He is asking if he can stay off this and start back up if he starts having problems again. Initial call taken by: [name] CMA,  August 17, 2009 2:51 PM  Follow-up for Phone Call        if his stomach is fine, okay to use that as needed only many people will take it if they eat too late, or are planning a meal that they know usually acts up their acid reflux problems Follow-up by: Stephen Gower MD,  August 17, 2009 3:39 PM  Additional Follow-up for Phone Call Additional follow up Details #1::        Patient Advised.  Additional Follow-up by: Christena Deem CMA Deborra Medina),  August 17, 2009 3:47 PM    New/Updated Medications: OMEPRAZOLE 20 MG  CPDR (OMEPRAZOLE) one by mouth daily as needed for acid reflux symptoms

## 2010-03-30 NOTE — Assessment & Plan Note (Signed)
Summary: COUGH   Vital Signs:  Patient profile:   77 year old male Weight:      176 pounds Temp:     98.2 degrees F oral Pulse rate:   88 / minute Pulse rhythm:   regular Resp:     16 per minute BP sitting:   170 / 100  (left arm) Cuff size:   large  Vitals Entered By: Edwin Dada CMA Deborra Medina) (January 26, 2009 4:35 PM) CC: cough   History of Present Illness: Has been sick for about 4-5 days worried about doxazosin causing cough so he tried stopping it yesterday gets the cough in spurts cough is dry seems isolated in his throat  nose doesn't seem congested chest is clear no SOB No fever No ear pain   Allergies: 1)  ! * Statins 2)  Lipitor 3)  Pravachol  Past History:  Past medical, surgical, family and social histories (including risk factors) reviewed for relevance to current acute and chronic problems.  Past Medical History: Diverticulosis, colon05/2005 GERD Hyperlipidemia Hypertension Benign prostatic hypertrophy Osteoarthritis, severe, B knees Renal insufficiency  Past Surgical History: Reviewed history from 09/03/2008 and no changes required. R 4th finger surgery Tonsils as child Lumbar spinal stenosis repair  06/2005 Bilateral total knee replacement, May 2010, Dr. Marry Guan  Family History: Reviewed history from 06/21/2006 and no changes required. Dad died @72  of "clogged arteries" Mom died @ 34 of old age, had HTN 1 brother died @24  of polia No prostate or colon cancer  Social History: Reviewed history from 09/03/2008 and no changes required. 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  NAD intermittent coarse cough Head:  no sinus tenderness Ears:  R ear normal and  L ear normal.   Nose:  mild congesiton Mouth:  no erythema and no exudates.   Neck:  supple, no masses, and no cervical lymphadenopathy.   Lungs:  normal respiratory effort, no intercostal retractions, no accessory muscle use, normal breath sounds, no crackles, and no wheezes.     Impression & Recommendations:  Problem # 1:  BRONCHITIS- ACUTE (ICD-466.0) Assessment New apparent viral infection recommended OTC cough syrup call if worsening over the week --would try antibiotic  Complete Medication List: 1)  Proscar 5 Mg Tabs (Finasteride) .Marland Kitchen.. 1 daily 2)  Hydrochlorothiazide 25 Mg Tabs (Hydrochlorothiazide) .Marland Kitchen.. 1 daily 3)  Multivitamins Caps (Multiple vitamin) .Marland Kitchen.. 1 daily 4)  Fish Oil Caps (Omega-3 fatty acids caps) .... Takes otc 5)  Clonazepam 0.5 Mg Tabs (Clonazepam) .Marland Kitchen.. 1-2 at bedtime 6)  Simvastatin 40 Mg Tabs (Simvastatin) .... Take as directed 7)  Lisinopril 40 Mg Tabs (Lisinopril) .... Take 1/2 tablet by mouth once daily 8)  Omeprazole 20 Mg Cpdr (Omeprazole) .... One by mouth 1/2 hour prior to breakfast. 9)  Mirapex 1 Mg Tabs (Pramipexole dihydrochloride) .Marland Kitchen.. 1-2 at bedtime for restless legs 10)  B Complex Vitamins Caps (B complex vitamins) .... Take 1 by mouth once daily 11)  Furosemide 40 Mg Tabs (Furosemide) .Marland Kitchen.. 1 tab daily for edema 12)  Zolpidem Tartrate 10 Mg Tabs (Zolpidem tartrate) .... Take 1 by mouth at bedtime as needed 13)  Doxazosin Mesylate 4 Mg Tabs (Doxazosin mesylate) .Marland Kitchen.. 1 tab daily for  enlarged prostate  Other Orders: TLB-Renal Function Panel (80069-RENAL) Venipuncture [id number])  Patient Instructions: 1)  Keep regular follow up appointment 2)  Call if the bronchitis is not improving by the end of the week 3)  Try over the counter cough syrup (dextromethorphan)  Current Allergies (reviewed today): ! * STATINS LIPITOR PRAVACHOL

## 2010-03-30 NOTE — Progress Notes (Signed)
Summary: needs to lose weight  Phone Note Call from Patient Call back at Home Phone [phone]   Caller: Patient Call For: Claris Gower MD Summary of Call: Patient is starting to play in tennis ternaments, he needs to lose 10 to 15 lbs. He wants to know if you could recommend something he could take to help him lose this. He has tried it with diet and exercise alone and it is not working. Uses [facility] if needed.  Initial call taken by: Lacretia Nicks,  September 07, 2009 8:44 AM  Follow-up for Phone Call        There are no safe and effective medications for people to lose weight (though people spend billiions of dollars a year for false hopes) He needs to eat prudently and stay active There is no "easy" way just taking a pill Follow-up by: Claris Gower MD,  September 07, 2009 2:07 PM  Additional Follow-up for Phone Call Additional follow up Details #1::        Spoke with patient and advised results.  Additional Follow-up by: Edwin Dada CMA Deborra Medina),  September 07, 2009 3:55 PM

## 2010-03-30 NOTE — Letter (Signed)
Summary: [facility] Note  [facility] Note   Imported By: Sallee Provencal 09/22/2008 16:40:53  _____________________________________________________________________  External Attachment:    Type:   Image     Comment:   External Document

## 2010-03-30 NOTE — Letter (Signed)
Summary: Dr.Steven Dingledein,[facility]-Re: Potassium  Dr.Steven Dingledein,[facility]-Re: Potassium   Imported By: Virgia Land 04/22/2009 15:04:28  _____________________________________________________________________  External Attachment:    Type:   Image     Comment:   External Document  Appended Document: [facility]Steven Dingledein, [facility]-Re: Potassium see phone note

## 2010-03-30 NOTE — Progress Notes (Signed)
  Phone Note Refill Request Message from:  [phone]

## 2010-03-30 NOTE — Progress Notes (Signed)
Summary: Potassium  Phone Note From Other Clinic   Caller: [facility] Call For: Dr. Silvio Pate Summary of Call: Fax received advising that pt's potassium is 3.1 and they are asking that a supplement be given before pt has surgery on 03/23/2009.  Form on your desk, please advise. Initial call taken by: Edwin Dada CMA Deborra Medina),  March 20, 2009 8:59 AM  Follow-up for Phone Call         Pt called,Pt to have cataract surgery on 03/23/09 and a friend gave the pt  Potssium Gluconate 595mg . Pt has started to take already and wanted to know if this is OK or will he need a new rx for potassium. Pt uses [facility] [phone] because they will deliver. Pt can be contacted at [phone]. Please advise. Ozzie Hoyle LPN  January 21, 624THL 9:20 AM   Additional Follow-up for Phone Call Additional follow up Details #1::        Okay to try that  Recheck potassium on Monday here and we will give Rx if still low Claris Gower MD  March 20, 2009 9:24 AM   pt is having surgery on Monday at [facility], they will re-check potassium there, pt would like rx called in for potassium, which one? Please advise DeShannon Tamala Julian CMA ([place])  March 20, 2009 10:03 AM   KCL 48meq daily #30 x 0  needs recheck 1-2 weeks after surgery Additional Follow-up by: Claris Gower MD,  March 20, 2009 10:10 AM    Additional Follow-up for Phone Call Additional follow up Details #2::    rx sent to pharmacy, Spoke with patient and advised results.  Follow-up by: Edwin Dada CMA ([place]),  March 20, 2009 10:20 AM  New/Updated Medications: POTASSIUM CHLORIDE 20 MEQ PACK (POTASSIUM CHLORIDE) take 1 by mouth once daily Prescriptions: POTASSIUM CHLORIDE 20 MEQ PACK (POTASSIUM CHLORIDE) take 1 by mouth once daily  #30 x 0   Entered by:   Edwin Dada CMA ([place])   Authorized by:   Claris Gower MD   Signed by:   Edwin Dada CMA ([place]) on 03/20/2009   Method used:   Electronically to    [facility]* (retail)       [address] [place], [place]  [address]       Ph: [phone]       Fax: [phone]   RxID:   [id number]

## 2010-03-30 NOTE — Letter (Signed)
Summary: Dr.Jeffrey Byrnett,[facility]Note  Dr.Jeffrey Byrnett,[facility]Note   Imported By: Virgia Land 11/18/2009 14:16:35  _____________________________________________________________________  External Attachment:    Type:   Image     Comment:   External Document

## 2010-03-30 NOTE — Progress Notes (Signed)
Summary: Referral for physical therapy  Phone Note Call from Patient Call back at [phone]   Caller: Patient Call For: Dr. Silvio Pate Summary of Call: Pt pulled a muscle behind his right knee and it is sore and he [facility] and he has an appt. Thursday at 9:30 and they need a referral from you for him to be able to see him.  Pt states that he is positive that it is a pulled muscle and thinks it needs a little work.  Does he need to see you first or can you do the referral for him? Initial call taken by: Emelia Salisbury,  July 30, 2007 9:02 AM  Follow-up for Phone Call        okay to do referral see if worsens or no help from therapist Follow-up by: Claris Gower MD,  July 30, 2007 9:57 AM  Additional Follow-up for Phone Call Additional follow up Details #1::        ORDER FAXED TO [place]. Haynes Bast  July 30, 2007 12:43 PM  Additional Follow-up by: Haynes Bast,  July 30, 2007 12:43 PM  New Problems: LEG SPRAIN (ICD-844.9)   New Problems: LEG SPRAIN (ICD-844.9)

## 2010-03-30 NOTE — Progress Notes (Signed)
Summary: refill (Stephen Garrett pt)  Phone Note Call from Patient Call back at [facility] Phone [phone]   Caller: Patient Call For: Stephen Garrett Summary of Call: klonipin rx, he uses for restless leg he called pharmacy for refill and was told it was denied. Initial call taken by: Daralene Milch,  October 03, 2006 9:18 AM  Follow-up for Phone Call        spoke with pt advised he would need appt and we can refill enough to last until app, but he says he cannot come in. he says his wife was dx with lung ca and she is on o2 and has been in hosp 2 times this past month, he's afraid to come in and possibly pick up something and bring it back to her he says he needs to wait until her resistance is up. he is almost out of med. ..................................................................Marland KitchenDaralene Milch  October 03, 2006 9:39 AM   Additional Follow-up for Phone Call Additional follow up Details #1::        Refill x 1 , then f/u with Dr. Silvio Pate  Additional Follow-up by: Eliezer Lofts MD,  October 03, 2006 9:57 AM    Additional Follow-up for Phone Call Additional follow up Details #2::    Medication phoned to pharmacy. Patient Advised.  Follow-up by: Christena Deem,  October 03, 2006 10:15 AM

## 2010-03-30 NOTE — Letter (Signed)
Summary: [facility]  [facility]   Imported By: Edmonia James 12/08/2009 13:15:18  _____________________________________________________________________  External Attachment:    Type:   Image     Comment:   External Document  Appended Document: [facility] doing fine after hernia repair resolving bruising

## 2010-03-30 NOTE — Progress Notes (Signed)
Summary: refil request for clonazepam  Phone Note Refill Request Message from:  Fax from Pharmacy  Refills Requested: Medication #1:  CLONAZEPAM 0.5 MG TABS 1-2 at bedtime   Last Refilled: 05/11/2009 Faxed request from [employer] is on your desk.  Initial call taken by: [name] CMA,  August 03, 2009 2:18 PM  Follow-up for Phone Call        okay #180 x 1 Follow-up by: Claris Gower MD,  August 03, 2009 5:47 PM  Additional Follow-up for Phone Call Additional follow up Details #1::        Rx faxed to pharmacy Additional Follow-up by: DeShannon Smith CMA Deborra Medina),  August 04, 2009 8:40 AM    Prescriptions: CLONAZEPAM 0.5 MG TABS (CLONAZEPAM) 1-2 at bedtime  #180 x 1   Entered by:   Edwin Dada CMA ([place])   Authorized by:   Claris Gower MD   Signed by:   Edwin Dada CMA ([place]) on 08/04/2009   Method used:   Handwritten   [id number]

## 2010-04-01 NOTE — Progress Notes (Signed)
Summary: how many grapefruits can pt eat?  Phone Note Call from Patient Call back at [facility] Phone [phone]   Caller: Patient Call For: Claris Gower MD Summary of Call: Pt is asking how many grapefruits he can eat per week with the meds that he is taking.  He is ok to wait till monday for the answer. Initial call taken by: [name] CMA, AAMA,  March 05, 2010 8:53 AM  Follow-up for Phone Call        Please have him check with his pharmacist I am not sure the simvastatin interacts with grapefruit Follow-up by: Claris Gower MD,  March 05, 2010 5:39 PM  Additional Follow-up for Phone Call Additional follow up Details #1::        Pt checked with pharmacist and he was told once a week would be fine. DeShannon Tamala Julian CMA Deborra Medina)  March 08, 2010 8:43 AM   okay Additional Follow-up by: Claris Gower MD,  March 08, 2010 10:14 AM

## 2010-04-01 NOTE — Progress Notes (Signed)
Summary: refill request for mirapex  Phone Note Refill Request Message from:  Fax from Pharmacy  Refills Requested: Medication #1:  MIRAPEX 1 MG  TABS 1-2 at bedtime for restless legs   Last Refilled: 09/07/2009 Faxed request from [facility] is on your desk.  Initial call taken by: [name] CMA, AAMA,  February 23, 2010 3:24 PM  Follow-up for Phone Call        Rx completed in Dr. Brantley Stage Follow-up by: Claris Gower MD,  February 24, 2010 7:45 AM    New/Updated Medications: MIRAPEX 1 MG  TABS (PRAMIPEXOLE DIHYDROCHLORIDE) 1-2 at bedtime for restless legs Prescriptions: MIRAPEX 1 MG  TABS (PRAMIPEXOLE DIHYDROCHLORIDE) 1-2 at bedtime for restless legs  #180 x 1   Entered and Authorized by:   Claris Gower MD   Signed by:   Claris Gower MD on 02/24/2010   Method used:   Electronically to        [facility]* (retail)       [address] [place], [place]  [address]       Ph: [phone]       Fax: [phone]   RxID:   [id number]

## 2010-04-06 ENCOUNTER — Encounter (INDEPENDENT_AMBULATORY_CARE_PROVIDER_SITE_OTHER): Payer: Self-pay | Admitting: *Deleted

## 2010-04-06 ENCOUNTER — Encounter: Payer: Self-pay | Admitting: Internal Medicine

## 2010-04-06 ENCOUNTER — Other Ambulatory Visit: Payer: Self-pay | Admitting: Internal Medicine

## 2010-04-06 ENCOUNTER — Ambulatory Visit: Payer: Medicare Other | Admitting: Internal Medicine

## 2010-04-06 DIAGNOSIS — E785 Hyperlipidemia, unspecified: Secondary | ICD-10-CM

## 2010-04-06 DIAGNOSIS — N400 Enlarged prostate without lower urinary tract symptoms: Secondary | ICD-10-CM

## 2010-04-06 DIAGNOSIS — K219 Gastro-esophageal reflux disease without esophagitis: Secondary | ICD-10-CM

## 2010-04-06 DIAGNOSIS — I10 Essential (primary) hypertension: Secondary | ICD-10-CM

## 2010-04-06 LAB — CBC WITH DIFFERENTIAL/PLATELET
Basophils Absolute: 0 10*3/uL (ref 0.0–0.1)
Basophils Relative: 0.4 % (ref 0.0–3.0)
Eosinophils Absolute: 0.1 10*3/uL (ref 0.0–0.7)
Eosinophils Relative: 1.8 % (ref 0.0–5.0)
HCT: 41.6 % (ref 39.0–52.0)
Hemoglobin: 14.3 g/dL (ref 13.0–17.0)
Lymphocytes Relative: 32.5 % (ref 12.0–46.0)
Lymphs Abs: 1.8 10*3/uL (ref 0.7–4.0)
MCHC: 34.3 g/dL (ref 30.0–36.0)
MCV: 93.2 fl (ref 78.0–100.0)
Monocytes Absolute: 0.4 10*3/uL (ref 0.1–1.0)
Monocytes Relative: 6.3 % (ref 3.0–12.0)
Neutro Abs: 3.3 10*3/uL (ref 1.4–7.7)
Neutrophils Relative %: 59 % (ref 43.0–77.0)
Platelets: 161 10*3/uL (ref 150.0–400.0)
RBC: 4.47 Mil/uL (ref 4.22–5.81)
RDW: 14.1 % (ref 11.5–14.6)
WBC: 5.6 10*3/uL (ref 4.5–10.5)

## 2010-04-06 LAB — HEPATIC FUNCTION PANEL
ALT: 35 U/L (ref 0–53)
AST: 54 U/L — ABNORMAL HIGH (ref 0–37)
Albumin: 4 g/dL (ref 3.5–5.2)
Alkaline Phosphatase: 64 U/L (ref 39–117)
Bilirubin, Direct: 0.2 mg/dL (ref 0.0–0.3)
Total Bilirubin: 0.8 mg/dL (ref 0.3–1.2)
Total Protein: 6.3 g/dL (ref 6.0–8.3)

## 2010-04-06 LAB — RENAL FUNCTION PANEL
Albumin: 4 g/dL (ref 3.5–5.2)
BUN: 44 mg/dL — ABNORMAL HIGH (ref 6–23)
CO2: 30 mEq/L (ref 19–32)
Calcium: 9.4 mg/dL (ref 8.4–10.5)
Chloride: 104 mEq/L (ref 96–112)
Creatinine, Ser: 1.7 mg/dL — ABNORMAL HIGH (ref 0.4–1.5)
GFR: 41.52 mL/min — ABNORMAL LOW (ref >60.00)
Glucose, Bld: 91 mg/dL (ref 70–99)
Phosphorus: 3.7 mg/dL (ref 2.3–4.6)
Potassium: 3.9 mEq/L (ref 3.5–5.1)
Sodium: 143 mEq/L (ref 135–145)

## 2010-04-06 LAB — LIPID PANEL
Cholesterol: 147 mg/dL (ref 0–200)
HDL: 40 mg/dL (ref >39.00)
LDL Cholesterol: 92 mg/dL (ref 0–99)
Total CHOL/HDL Ratio: 4
Triglycerides: 76 mg/dL (ref 0.0–149.0)
VLDL: 15.2 mg/dL (ref 0.0–40.0)

## 2010-04-06 LAB — TSH: TSH: 1.82 u[IU]/mL (ref 0.35–5.50)

## 2010-04-14 ENCOUNTER — Ambulatory Visit: Payer: Self-pay | Admitting: Internal Medicine

## 2010-04-15 ENCOUNTER — Telehealth: Payer: Self-pay | Admitting: Internal Medicine

## 2010-04-15 NOTE — Assessment & Plan Note (Signed)
Summary: Check Lip   Vital Signs:  Patient profile:   77 year old male Weight:      180 pounds Temp:     97.6 degrees F oral Pulse rate:   56 / minute Pulse rhythm:   regular BP sitting:   153 / 80  (left arm) Cuff size:   large  Vitals Entered By: Edwin Dada CMA Deborra Medina) (April 06, 2010 8:12 AM) CC: check lip and left ear   History of Present Illness: Has had trouble with his lips again over the past 3 weeks Not a cold sore Has peeling dry area Tried [facility] mouthwash without effect Carmex no help either  Has been eating special diet---increased veggies and fruits Still playing tennis regularly--in tournament in [place] recently  No chest pain No SOB some issues with stamina but can still run on the court Edema is better---uses lasix very sporadically  Voiding well Nocturia only 1-2 per night now  Allergies: 1)  ! * Statins 2)  Lipitor 3)  Pravachol  Past History:  Past medical, surgical, family and social histories (including risk factors) reviewed for relevance to current acute and chronic problems.  Past Medical History: Reviewed history from 01/26/2009 and no changes required. Diverticulosis, colon05/2005 GERD Hyperlipidemia Hypertension Benign prostatic hypertrophy Osteoarthritis, severe, B knees Renal insufficiency  Past Surgical History: Reviewed history from 11/30/2009 and no changes required. R 4th finger surgery Tonsils as child Lumbar spinal stenosis repair  06/2005 Bilateral total knee replacement, May 2010, Dr. Marry Guan Cataracts--bilateral 2011  Dr [facility]---  Dr Bary Castilla   9/11  Family History: Reviewed history from 06/21/2006 and no changes required. Dad died @72  of "clogged arteries" Mom died @ 49 of old age, had HTN 1 brother died @24  of polia No prostate or colon cancer  Social History: Reviewed history from 08/12/2009 and no changes required. Retired--school principal Widowed  2010-2 adopted children Never  Smoked Alcohol use-no    Review of Systems       Ongoing sleep problems--only gets 5-6 hours. Does nap in day though and this is helpful Appetite is good weight down a few pounds Mood is pretty good---  "but I realize for the first time that I am going to die" Still grieves his wife and cries, "but it doesn't hurt" Does have a lady friend and this helps  Physical Exam  General:  alert and normal appearance.   Neck:  supple, no masses, no carotid bruits, and no cervical lymphadenopathy.   Lungs:  normal respiratory effort, no intercostal retractions, no accessory muscle use, and normal breath sounds.   Heart:  normal rate, regular rhythm, and no gallop.   Soft aortic systolic murmur Abdomen:  soft and non-tender.   Msk:  no joint tenderness and no joint swelling.   Extremities:  no edema Neurologic:  alert & oriented X3, strength normal in all extremities, and gait normal.   Psych:  normally interactive, good eye contact, not anxious appearing, and not depressed appearing.     Impression & Recommendations:  Problem # 1:  HYPERTENSION (ICD-401.9) Assessment Unchanged  reasonable control for age no changes  His updated medication list for this problem includes:    Furosemide 40 Mg Tabs (Furosemide) .Marland Kitchen... 1 tab daily  as needed for edema    Doxazosin Mesylate 4 Mg Tabs (Doxazosin mesylate) .Marland Kitchen... 1& 1/2 tab daily for enlarged prostate    Hydrochlorothiazide 25 Mg Tabs (Hydrochlorothiazide) .Marland Kitchen... Take 1 by mouth once daily  BP today: 153/80 Prior  BP: 156/80 (11/30/2009)  Labs Reviewed: K+: 4.7 (08/12/2009) Creat: : 1.4 (08/12/2009)   Chol: 166 (01/16/2009)   HDL: 44.50 (01/16/2009)   LDL: 111 (01/16/2009)   TG: 55.0 (01/16/2009)  Orders: Venipuncture [id number]) TLB-Renal Function Panel (80069-RENAL) TLB-CBC Platelet - w/Differential (85025-CBCD) TLB-TSH (Thyroid Stimulating Hormone) (84443-TSH)  Problem # 2:  HYPERLIPIDEMIA (ICD-272.4) Assessment: Unchanged  no side  effects  due for labs  His updated medication list for this problem includes:    Simvastatin 40 Mg Tabs (Simvastatin) .Marland Kitchen... Take 1/2 by mouth once daily  Labs Reviewed: SGOT: 27 (01/16/2009)   SGPT: 24 (01/16/2009)   HDL:44.50 (01/16/2009), 29.1 (07/24/2007)  LDL:111 (01/16/2009), 101 (07/24/2007)  Chol:166 (01/16/2009), 147 (07/24/2007)  Trig:55.0 (01/16/2009), 87 (07/24/2007)  Orders: TLB-Lipid Panel (80061-LIPID) TLB-Hepatic/Liver Function Pnl (80076-HEPATIC)  Problem # 3:  GERD (ICD-530.81) Assessment: Unchanged stomach fine uses med daily  His updated medication list for this problem includes:    Omeprazole 20 Mg Cpdr (Omeprazole) ..... One by mouth daily  for acid reflux symptoms  Problem # 4:  BENIGN PROSTATIC HYPERTROPHY (ICD-600.00) Assessment: Unchanged doing okay on dual therapy  Problem # 5:  RESTLESS LEG SYNDROME (ICD-333.94) Assessment: Unchanged ongoing issues on the same meds  Complete Medication List: 1)  Proscar 5 Mg Tabs (Finasteride) .Marland Kitchen.. 1 daily 2)  Clonazepam 0.5 Mg Tabs (Clonazepam) .Marland Kitchen.. 1-2 at bedtime 3)  Simvastatin 40 Mg Tabs (Simvastatin) .... Take 1/2 by mouth once daily 4)  Mirapex 1 Mg Tabs (Pramipexole dihydrochloride) .Marland Kitchen.. 1-2 at bedtime for restless legs 5)  Furosemide 40 Mg Tabs (Furosemide) .Marland Kitchen.. 1 tab daily  as needed for edema 6)  Zolpidem Tartrate 10 Mg Tabs (Zolpidem tartrate) .... Take 1 by mouth at bedtime as needed 7)  Doxazosin Mesylate 4 Mg Tabs (Doxazosin mesylate) .Marland Kitchen.. 1& 1/2 tab daily for enlarged prostate 8)  Potassium Chloride 20 Meq Pack (Potassium chloride) .... Take 1 by mouth once daily when taking furosemide 9)  Hydrochlorothiazide 25 Mg Tabs (Hydrochlorothiazide) .... Take 1 by mouth once daily 10)  Omeprazole 20 Mg Cpdr (Omeprazole) .... One by mouth daily  for acid reflux symptoms 11)  Multivitamins Caps (Multiple vitamin) .Marland Kitchen.. 1 daily 12)  Fish Oil Caps (Omega-3 fatty acids caps) .Marland Kitchen.. 1 cap three times a  day  Patient Instructions: 1)  Please schedule a follow-up appointment in 6 months .    Orders Added: 1)  Est. Patient Level IV [id number] 2)  Venipuncture [36415] 3)  TLB-Renal Function Panel [80069-RENAL] 4)  TLB-CBC Platelet - w/Differential [85025-CBCD] 5)  TLB-TSH (Thyroid Stimulating Hormone) [84443-TSH] 6)  TLB-Lipid Panel [80061-LIPID] 7)  TLB-Hepatic/Liver Function Pnl [80076-HEPATIC]    Current Allergies (reviewed today): ! * STATINS LIPITOR PRAVACHOL  Appended Document: Check Lip Lip shows some healed sore area No induration No mucosal abnormalities  Asked me to check spot behind left ear Looks like blister with fluid out No inflammation Discussed expected healing course for this and will see if not improved

## 2010-04-21 NOTE — Progress Notes (Signed)
Summary: furosemide, simvastatin   Phone Note Refill Request   Refills Requested: Medication #1:  FUROSEMIDE 40 MG TABS 1 tab daily  as needed for edema  Medication #2:  SIMVASTATIN 40 MG  TABS take 1/2 by mouth once daily Patient says that he has contacted the pharmacy and was told that they have sent numerous requests over, but we have not received anything. Does Dr.Khalifa Knecht fill this for him?   Initial call taken by: Lacretia Nicks,  April 15, 2010 11:30 AM  Follow-up for Phone Call        spoke with patient and he states he only takes the simvastatin three times a week and he cuts those is half, last refilled by [place] was 11/10/2008 with 11 refills. Lasix was last refilled 02/04/2008 by Dr.Copland then stopped 05/14/2008 due to renal insufficiency and low potassium, now it's restarted. Per pt he still takes both, rx sent to [facility]. [place] Deborra Medina)  April 15, 2010 11:55 AM   okay Follow-up by: Claris Gower MD,  April 15, 2010 12:46 PM    Prescriptions: FUROSEMIDE 40 MG TABS (FUROSEMIDE) 1 tab daily  as needed for edema  #90 x 3   Entered by:   Edwin Dada CMA ([place])   Authorized by:   Claris Gower MD   Signed by:   Edwin Dada CMA ([place]) on 04/15/2010   Method used:   Electronically to        [facility]* (retail)       [place], [place]  [address]       Ph: [phone]       Fax: [phone]   RxID:   [id number] SIMVASTATIN 40 MG  TABS (SIMVASTATIN) take 1/2 by mouth once daily  #45 x 3   Entered by:   Edwin Dada CMA ([place])   Authorized by:   Claris Gower MD   Signed by:   Edwin Dada CMA ([place]) on 04/15/2010   Method used:   Electronically to        [facility]* (retail)       [address] [place], [place]  [address]       Ph: [phone]       Fax: [phone]   RxID:   [id number]

## 2010-07-23 ENCOUNTER — Encounter: Payer: Self-pay | Admitting: Family Medicine

## 2010-07-23 ENCOUNTER — Ambulatory Visit (INDEPENDENT_AMBULATORY_CARE_PROVIDER_SITE_OTHER): Payer: Medicare Other | Admitting: Family Medicine

## 2010-07-23 ENCOUNTER — Encounter: Payer: Self-pay | Admitting: Internal Medicine

## 2010-07-23 DIAGNOSIS — R05 Cough: Secondary | ICD-10-CM

## 2010-07-23 DIAGNOSIS — T148XXA Other injury of unspecified body region, initial encounter: Secondary | ICD-10-CM

## 2010-07-23 DIAGNOSIS — R059 Cough, unspecified: Secondary | ICD-10-CM

## 2010-07-23 NOTE — Patient Instructions (Signed)
Try to get some rest and gargle with warm salt water for your throat.  This should gradually improve.  Take care.  Let us know if you have other concerns.   I don't think you have a hernia.

## 2010-07-23 NOTE — Progress Notes (Signed)
Cough.  Started a few days ago.  Episodic.  No sputum.  No fevers. Some ST, scratchy, possibly from coughing.  No ear pain, no rhinorrhea.  No sick contacts.   ?Pulled muscle.  Prev had pulled it, years ago.  Occ discomfort sitting, was able to play tennis yesterday.  Felt it more after exercise.  R groin area now.  No mass felt.  No pain currently.  Meds, vitals, and allergies reviewed.   ROS: See HPI.  Otherwise, noncontributory.  GEN: nad, alert and oriented HEENT: mucous membranes moist, tm w/o erythema, nasal exam w/o erythema, clear discharge noted,  OP with cobblestoning NECK: supple w/o LA CV: rrr.   PULM: ctab, no inc wob EXT: no edema SKIN: no acute rash ABD: soft, not ttp, no inguinal hernia felt.  The area of the prev discomfort is superior to inguinal canal & to the right of midline

## 2010-07-23 NOTE — Assessment & Plan Note (Signed)
Benign exam, supportive tx and fu prn.  Lungs clear and no indication for abx.  D/w pt and he understood.

## 2010-07-23 NOTE — Assessment & Plan Note (Addendum)
I would ease back into exercise and then increase activity as tolerated, no hernia felt.  Possible oblique strain.  Anatomy d/w pt.

## 2010-07-24 ENCOUNTER — Encounter: Payer: Self-pay | Admitting: Family Medicine

## 2010-07-27 ENCOUNTER — Other Ambulatory Visit: Payer: Self-pay | Admitting: *Deleted

## 2010-07-27 NOTE — Telephone Encounter (Signed)
Form on your desk  

## 2010-07-28 MED ORDER — CLONAZEPAM 0.5 MG PO TABS: ORAL_TABLET | ORAL | Status: DC

## 2010-07-28 NOTE — Telephone Encounter (Signed)
I have sig as 1-2 at bedtime prn Okay #180 x 0

## 2010-07-28 NOTE — Telephone Encounter (Signed)
rx faxed to pharmacy manually, sig corrected

## 2010-08-31 ENCOUNTER — Other Ambulatory Visit: Payer: Self-pay | Admitting: *Deleted

## 2010-08-31 MED ORDER — ZOLPIDEM TARTRATE 10 MG PO TABS: 10.0000 mg | ORAL_TABLET | Freq: Every evening | ORAL | Status: DC | PRN

## 2010-08-31 NOTE — Telephone Encounter (Signed)
rx faxed to pharmacy manually  

## 2010-08-31 NOTE — Telephone Encounter (Signed)
Form on your desk  

## 2010-08-31 NOTE — Telephone Encounter (Signed)
Okay #30 x 0 

## 2010-09-20 ENCOUNTER — Telehealth: Payer: Self-pay | Admitting: *Deleted

## 2010-09-20 ENCOUNTER — Other Ambulatory Visit: Payer: Self-pay | Admitting: *Deleted

## 2010-09-20 MED ORDER — FINASTERIDE 5 MG PO TABS: 5.0000 mg | ORAL_TABLET | Freq: Every day | ORAL | Status: DC

## 2010-09-20 NOTE — Telephone Encounter (Signed)
Patient says that he has been having trouble going to sleep and has a hard time staying asleep through the night. He is asking if he can get a referral to the sleep clinic. Please advise.

## 2010-09-20 NOTE — Telephone Encounter (Signed)
Patient scheduled appt.

## 2010-09-20 NOTE — Telephone Encounter (Signed)
rx sent to pharmacy by e-script  

## 2010-09-20 NOTE — Telephone Encounter (Signed)
You only usually go to a sleep clinic if there is concern for a disorder like sleep apnea If he wants to discuss other treatment options, that would be with me

## 2010-09-21 ENCOUNTER — Encounter: Payer: Self-pay | Admitting: Internal Medicine

## 2010-09-21 ENCOUNTER — Ambulatory Visit (INDEPENDENT_AMBULATORY_CARE_PROVIDER_SITE_OTHER): Payer: Medicare Other | Admitting: Internal Medicine

## 2010-09-21 DIAGNOSIS — G2581 Restless legs syndrome: Secondary | ICD-10-CM

## 2010-09-21 NOTE — Patient Instructions (Signed)
Please continue the half of mirapex in the morning, but increase the 6PM dose to 1 full tab and take another half at bedtime. Please don't take the clonazepam till bedtime and take both of them then Please call for referral to sleep specialist if you are not sleeping better in the next few weeks

## 2010-09-21 NOTE — Progress Notes (Signed)
Subjective:    Patient ID: Stephen Garrett, male    DOB: 1923/04/18, 77 y.o.   MRN: [id number]  HPI Stephen [facility] to [facility] Was not sleeping well due to restless legs and periodic leg movements Did well on initial meds Started changing a couple of years ago---starting getting up every hour to void instead of every 2 hours Worsening even more in past few nights Only getting 2-3 hours per night  Takes mirapex and clonazepam every night for the restless legs Takes first clonazepam and some of mirapex at [facility], then other at bedtime, otherwise legs start bothering him occ takes [place] or acetaminophen He feels he is waking up first, then just goes to the bathroom Doesn't seem to be the restless legs  Mood has been generally okay No regular mood issues  Very caffeine ---occ soda but using caffeine free Watches TV in evening and falls asleep ~8PM. Will awaken again after 1 hour Has lady friend and spends some evenings with her---doesn't fall asleep then. May sleep better those nights  Current Outpatient Prescriptions on File Prior to Visit  Medication Sig Dispense Refill  . clonazePAM (KLONOPIN) 0.5 MG tablet Take 1-2 tablets by mouth at bedtime as needed  180 tablet  0  . doxazosin (CARDURA) 1 MG tablet Take 6 mg by mouth at bedtime.        . finasteride (PROSCAR) 5 MG tablet Take 1 tablet (5 mg total) by mouth daily.  90 tablet  3  . fish oil-omega-3 fatty acids 1000 MG capsule Take 1 g by mouth 3 (three) times daily.        . furosemide (LASIX) 40 MG tablet Take 40 mg by mouth daily as needed.       . hydrochlorothiazide 25 MG tablet Take 25 mg by mouth daily.        . Multiple Vitamin (MULTIVITAMIN) capsule Take 1 capsule by mouth daily.        Marland Kitchen omeprazole (PRILOSEC) 20 MG capsule Take 20 mg by mouth daily.        . potassium chloride (KLOR-CON) 20 MEQ packet Take 20 mEq by mouth daily. When taking lasix       . pramipexole (MIRAPEX) 1 MG tablet Take 1-2 mg by mouth  at bedtime. For restless legs       . simvastatin (ZOCOR) 40 MG tablet Take 20 mg by mouth at bedtime.        Marland Kitchen zolpidem (AMBIEN) 10 MG tablet Take 1 tablet (10 mg total) by mouth at bedtime as needed.  30 tablet      Allergies  Allergen Reactions  . Atorvastatin     REACTION: raises blood pressure  . Pravastatin Sodium     REACTION: raises blood pressure  . Statins     REACTION: raises bp    Past Medical History  Diagnosis Date  . Diverticulosis of colon 06/2003  . GERD (gastroesophageal reflux disease)   . HLD (hyperlipidemia)   . HTN (hypertension)   . BPH (benign prostatic hypertrophy)   . Osteoarthritis of knee     severe, bilat  . Renal insufficiency   . Actinic keratosis   . Diseases of lips   . Psychosexual dysfunction with inhibited sexual excitement   . Restless legs syndrome (RLS)   . Sleep disturbance, unspecified   . Spinal stenosis, lumbar region, without neurogenic claudication     Past Surgical History  Procedure Date  . Finger surgery  Right 4th finger  . Tonsillectomy childhood  . Lumbar spine surgery 06/2005    stenosis repair  . Total knee arthroplasty 06/2008    bilat (Dr. Marry Guan)  . Cataract extraction, bilateral 2011    Dr. Thomasene Ripple  . [place] 10/2009    Dr. Bary Castilla    Family History  Problem Relation Age of Onset  . Other Father     "clogged arteries"  . Hypertension Mother     History   Social History  . Marital Status: Widowed    Spouse Name: N/A    Number of Children: 2  . Years of Education: N/A   Occupational History  . Retired    Social History Main Topics  . Smoking status: Former Smoker    Quit date: 02/28/1970  . Smokeless tobacco: Never Used  . Alcohol Use: Yes     1-2 glasses wine/daily  . Drug Use: No  . Sexually Active: Not on file   Other Topics Concern  . 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

## 2010-09-21 NOTE — Assessment & Plan Note (Signed)
Doesn't seem to be up due to nocturia, it is restless legs He uses some of the mirapex in AM and then 6PM and bedtime Discussed increasing this, while holding off on clonazepam till ready for sleep counselled more than half of 15 minute visit

## 2010-10-04 ENCOUNTER — Other Ambulatory Visit: Payer: Self-pay | Admitting: *Deleted

## 2010-10-04 MED ORDER — HYDROCHLOROTHIAZIDE 25 MG PO TABS: 25.0000 mg | ORAL_TABLET | Freq: Every day | ORAL | Status: DC

## 2010-10-13 ENCOUNTER — Encounter: Payer: Self-pay | Admitting: Internal Medicine

## 2010-10-13 ENCOUNTER — Ambulatory Visit (INDEPENDENT_AMBULATORY_CARE_PROVIDER_SITE_OTHER): Payer: Medicare Other | Admitting: Internal Medicine

## 2010-10-13 DIAGNOSIS — R209 Unspecified disturbances of skin sensation: Secondary | ICD-10-CM

## 2010-10-13 DIAGNOSIS — I10 Essential (primary) hypertension: Secondary | ICD-10-CM

## 2010-10-13 DIAGNOSIS — G2581 Restless legs syndrome: Secondary | ICD-10-CM

## 2010-10-13 DIAGNOSIS — M48061 Spinal stenosis, lumbar region without neurogenic claudication: Secondary | ICD-10-CM

## 2010-10-13 DIAGNOSIS — R200 Anesthesia of skin: Secondary | ICD-10-CM

## 2010-10-13 DIAGNOSIS — N400 Enlarged prostate without lower urinary tract symptoms: Secondary | ICD-10-CM

## 2010-10-13 LAB — BASIC METABOLIC PANEL
BUN: 29 mg/dL — ABNORMAL HIGH (ref 6–23)
CO2: 31 mEq/L (ref 19–32)
Calcium: 9.3 mg/dL (ref 8.4–10.5)
Chloride: 105 mEq/L (ref 96–112)
Creatinine, Ser: 1.4 mg/dL (ref 0.4–1.5)
GFR: 51.6 mL/min — ABNORMAL LOW (ref >60.00)
Glucose, Bld: 98 mg/dL (ref 70–99)
Potassium: 3.3 mEq/L — ABNORMAL LOW (ref 3.5–5.1)
Sodium: 143 mEq/L (ref 135–145)

## 2010-10-13 LAB — TSH: TSH: 2.36 u[IU]/mL (ref 0.35–5.50)

## 2010-10-13 LAB — VITAMIN B12: Vitamin B-12: 531 pg/mL (ref 211–911)

## 2010-10-13 NOTE — Assessment & Plan Note (Signed)
No pain or functional problems No need for reeval

## 2010-10-13 NOTE — Assessment & Plan Note (Signed)
Voiding okay Will continue meds

## 2010-10-13 NOTE — Progress Notes (Signed)
Subjective:    Patient ID: Stephen Garrett, male    DOB: Oct 31, 1923, 77 y.o.   MRN: [id number]  HPI Doing great with the med changes at night Sleeping 7 hours straight and only gets up once a night at most No longer needing the AM mirapex  Now concerned with tingling and  numbness in both hands--esp noted when holding steering wheel Has had carpal tunnel as well in past--now more prominent No nocturnal symptoms or during tennis No hand weakness Also notes numbness in legs---history of spinal stenosis and he feels it may be related to this Never back to normal since then. Just concerned they could be related  No back or leg pain  No chest pain No SOB Feels his tennis game is actually better  Voiding okay No daytime symptoms and little nocturia  Current Outpatient Prescriptions on File Prior to Visit  Medication Sig Dispense Refill  . clonazePAM (KLONOPIN) 0.5 MG tablet Take 1-2 tablets by mouth at bedtime as needed  180 tablet  0  . doxazosin (CARDURA) 1 MG tablet Take 6 mg by mouth at bedtime.        . finasteride (PROSCAR) 5 MG tablet Take 1 tablet (5 mg total) by mouth daily.  90 tablet  3  . fish oil-omega-3 fatty acids 1000 MG capsule Take 1 g by mouth 3 (three) times daily.        . furosemide (LASIX) 40 MG tablet Take 40 mg by mouth daily as needed.       . hydrochlorothiazide 25 MG tablet Take 1 tablet (25 mg total) by mouth daily.  90 tablet  3  . Multiple Vitamin (MULTIVITAMIN) capsule Take 1 capsule by mouth daily.        Marland Kitchen omeprazole (PRILOSEC) 20 MG capsule Take 20 mg by mouth daily.        . potassium chloride (KLOR-CON) 20 MEQ packet Take 20 mEq by mouth daily. When taking lasix       . pramipexole (MIRAPEX) 1 MG tablet Take 1-2 mg by mouth at bedtime. For restless legs       . simvastatin (ZOCOR) 40 MG tablet Take 20 mg by mouth at bedtime.          Allergies  Allergen Reactions  . Atorvastatin     REACTION: raises blood pressure  . Pravastatin Sodium    REACTION: raises blood pressure  . Statins     REACTION: raises bp    Past Medical History  Diagnosis Date  . Diverticulosis of colon 06/2003  . GERD (gastroesophageal reflux disease)   . HLD (hyperlipidemia)   . HTN (hypertension)   . BPH (benign prostatic hypertrophy)   . Osteoarthritis of knee     severe, bilat  . Renal insufficiency   . Actinic keratosis   . Diseases of lips   . Psychosexual dysfunction with inhibited sexual excitement   . Restless legs syndrome (RLS)   . Sleep disturbance, unspecified   . Spinal stenosis, lumbar region, without neurogenic claudication     Past Surgical History  Procedure Date  . Finger surgery     Right 4th finger  . Tonsillectomy childhood  . Lumbar spine surgery 06/2005    stenosis repair  . Total knee arthroplasty 06/2008    bilat (Dr. Marry Guan)  . Cataract extraction, bilateral 2011    Dr. Thomasene Ripple  . Lih 10/2009    Dr. Bary Castilla    Family History  Problem Relation Age of Onset  . Other  Father     "clogged arteries"  . Hypertension Mother     History   Social History  . Marital Status: Widowed    Spouse Name: N/A    Number of Children: 2  . Years of Education: N/A   Occupational History  . Retired    Social History Main Topics  . Smoking status: Former Smoker    Quit date: 02/28/1970  . Smokeless tobacco: Never Used  . Alcohol Use: Yes     1-2 glasses wine/daily  . Drug Use: No  . Sexually Active: Not on file   Other Topics Concern  . 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 No distress.  Neck: Normal range of motion. Neck supple. No thyromegaly present.  Cardiovascular: Normal rate, regular rhythm, normal heart sounds and intact distal pulses.  Exam reveals no gallop.   No murmur  heard.      occ skip beats  Pulmonary/Chest: Effort normal and breath sounds normal. No respiratory distress. He has no wheezes. He has no rales.  Musculoskeletal: Normal range of motion. He exhibits no edema and no tenderness.  Lymphadenopathy:    He has no cervical adenopathy.  Neurological:       Normal strength in arms and legs Fine touch sensation is intact   Psychiatric: He has a normal mood and affect. His behavior is normal. Judgment and thought content normal.          Assessment & Plan:

## 2010-10-13 NOTE — Assessment & Plan Note (Signed)
Could be generalized neuropathy but more likely the residual from spinal stenosis in legs and ?mild CTS in hands Discussed trying splints No loss of muscle or function Will check labs

## 2010-10-13 NOTE — Assessment & Plan Note (Signed)
BP Readings from Last 3 Encounters:  10/13/10 122/80  09/21/10 150/80  07/23/10 142/80   Good control No changes

## 2010-10-13 NOTE — Assessment & Plan Note (Signed)
Much improved with current regimen No changes needed

## 2010-11-02 ENCOUNTER — Other Ambulatory Visit: Payer: Self-pay | Admitting: *Deleted

## 2010-11-02 MED ORDER — CLONAZEPAM 0.5 MG PO TABS: ORAL_TABLET | ORAL | Status: DC

## 2010-11-02 NOTE — Telephone Encounter (Signed)
Okay #180 x 0 

## 2010-11-02 NOTE — Telephone Encounter (Signed)
rx faxed to pharmacy manually  

## 2010-11-02 NOTE — Telephone Encounter (Signed)
Form on your desk  

## 2010-11-15 ENCOUNTER — Other Ambulatory Visit: Payer: Self-pay | Admitting: *Deleted

## 2010-11-15 MED ORDER — DOXAZOSIN MESYLATE 1 MG PO TABS: 6.0000 mg | ORAL_TABLET | Freq: Every day | ORAL | Status: DC

## 2010-11-15 NOTE — Telephone Encounter (Signed)
Okay to refill? 

## 2010-11-16 ENCOUNTER — Telehealth: Payer: Self-pay | Admitting: *Deleted

## 2010-11-16 MED ORDER — DOXAZOSIN MESYLATE 4 MG PO TABS: 6.0000 mg | ORAL_TABLET | Freq: Every day | ORAL | Status: DC

## 2010-11-16 NOTE — Telephone Encounter (Signed)
Script sent in yesterday for doxazosin was incorrect.  Pt gets 4 mg tabs and takes 1 and one half at bedtime.  He also gets 90 day supply at a time, and a 30 day supply was called in.  New script will need to be sent in.

## 2010-11-16 NOTE — Telephone Encounter (Signed)
The fax that we received from [facility] had 1mg  tabs 6 per day, I will change rx to 4mg  tabs. rx sent to pharmacy by e-script

## 2010-11-29 ENCOUNTER — Other Ambulatory Visit: Payer: Self-pay | Admitting: *Deleted

## 2010-11-29 MED ORDER — PRAMIPEXOLE DIHYDROCHLORIDE 1 MG PO TABS: 1.0000 mg | ORAL_TABLET | Freq: Every day | ORAL | Status: DC

## 2010-12-20 ENCOUNTER — Telehealth: Payer: Self-pay | Admitting: *Deleted

## 2010-12-20 NOTE — Telephone Encounter (Signed)
Pt states he can have health screening through [facility] for $60.00 and he asks if you think that's a good idea.  Please advise.

## 2010-12-20 NOTE — Telephone Encounter (Signed)
Spoke with patient and advised results   

## 2010-12-20 NOTE — Telephone Encounter (Signed)
I don't recommend them They are not high quality tests and may cause more problems than they help

## 2010-12-27 ENCOUNTER — Other Ambulatory Visit: Payer: Self-pay | Admitting: Internal Medicine

## 2010-12-27 NOTE — Telephone Encounter (Signed)
Patient requesting Doxazosin 6mg  90 tabs to be called into [facility].  He said old prescription was for 4mg   And he needs 6mg  instead.  He said it gets complicated but 6mg  is what he is requesting.  His call back is [phone].

## 2010-12-27 NOTE — Telephone Encounter (Signed)
Pharmacist confirmed rx was sent 11/16/10

## 2010-12-27 NOTE — Telephone Encounter (Signed)
Spoke with patient and advised there is not a 6mg  tablet and that on 11/16/10 we sent in a rx for 135 tablets 90day supply.

## 2011-01-31 ENCOUNTER — Other Ambulatory Visit: Payer: Self-pay | Admitting: *Deleted

## 2011-01-31 MED ORDER — CLONAZEPAM 0.5 MG PO TABS: ORAL_TABLET | ORAL | Status: DC

## 2011-01-31 NOTE — Telephone Encounter (Signed)
Received faxed refill request from pharmacy. Is it okay to refill medication? 

## 2011-01-31 NOTE — Telephone Encounter (Signed)
rx called into pharmacy

## 2011-01-31 NOTE — Telephone Encounter (Signed)
Okay #180 x 0 

## 2011-02-01 ENCOUNTER — Encounter: Payer: Self-pay | Admitting: Internal Medicine

## 2011-02-01 ENCOUNTER — Ambulatory Visit (INDEPENDENT_AMBULATORY_CARE_PROVIDER_SITE_OTHER): Payer: Medicare Other | Admitting: Internal Medicine

## 2011-02-01 VITALS — BP 198/94 | HR 96 | Ht 67.0 in | Wt 180.0 lb

## 2011-02-01 DIAGNOSIS — M79609 Pain in unspecified limb: Secondary | ICD-10-CM

## 2011-02-01 DIAGNOSIS — I10 Essential (primary) hypertension: Secondary | ICD-10-CM

## 2011-02-01 DIAGNOSIS — M79642 Pain in left hand: Secondary | ICD-10-CM | POA: Insufficient documentation

## 2011-02-01 LAB — BASIC METABOLIC PANEL
BUN: 31 mg/dL — ABNORMAL HIGH (ref 6–23)
CO2: 30 mEq/L (ref 19–32)
Calcium: 9.6 mg/dL (ref 8.4–10.5)
Chloride: 103 mEq/L (ref 96–112)
Creatinine, Ser: 1.4 mg/dL (ref 0.4–1.5)
GFR: 52 mL/min — ABNORMAL LOW (ref >60.00)
Glucose, Bld: 119 mg/dL — ABNORMAL HIGH (ref 70–99)
Potassium: 3.5 mEq/L (ref 3.5–5.1)
Sodium: 141 mEq/L (ref 135–145)

## 2011-02-01 NOTE — Patient Instructions (Signed)
Please set up the referral to the hand surgeon

## 2011-02-01 NOTE — Progress Notes (Signed)
Subjective:    Patient ID: Stephen Garrett, male    DOB: [birth year], 77 y.o.   MRN: [id number]  HPI Awoke recently with left 3rd finger contracted Had sig pain in finger after tennis Some improvement after ice but persistent pain in 3rd finger  Mild numbness in 2nd and 4th fingers---esp with driving Mostly just 3rd finger problems  Current Outpatient Prescriptions on File Prior to Visit  Medication Sig Dispense Refill  . clonazePAM (KLONOPIN) 0.5 MG tablet Take 1-2 tablets by mouth at bedtime as needed  180 tablet  0  . doxazosin (CARDURA) 4 MG tablet Take 1.5 tablets (6 mg total) by mouth at bedtime.  135 tablet  3  . finasteride (PROSCAR) 5 MG tablet Take 1 tablet (5 mg total) by mouth daily.  90 tablet  3  . fish oil-omega-3 fatty acids 1000 MG capsule Take 1 g by mouth 3 (three) times daily.        . hydrochlorothiazide 25 MG tablet Take 1 tablet (25 mg total) by mouth daily.  90 tablet  3  . Multiple Vitamin (MULTIVITAMIN) capsule Take 1 capsule by mouth daily.        Marland Kitchen omeprazole (PRILOSEC) 20 MG capsule Take 20 mg by mouth daily.        . pramipexole (MIRAPEX) 1 MG tablet Take 1-2 tablets (1-2 mg total) by mouth at bedtime. For restless legs  180 tablet  0  . simvastatin (ZOCOR) 40 MG tablet Take 20 mg by mouth at bedtime.          Allergies  Allergen Reactions  . Atorvastatin     REACTION: raises blood pressure  . Pravastatin Sodium     REACTION: raises blood pressure  . Statins     REACTION: raises bp    Past Medical History  Diagnosis Date  . Diverticulosis of colon 06/2003  . GERD (gastroesophageal reflux disease)   . HLD (hyperlipidemia)   . HTN (hypertension)   . BPH (benign prostatic hypertrophy)   . Osteoarthritis of knee     severe, bilat  . Renal insufficiency   . Actinic keratosis   . Diseases of lips   . Psychosexual dysfunction with inhibited sexual excitement   . Restless legs syndrome (RLS)   . Sleep disturbance, unspecified   . Spinal stenosis,  lumbar region, without neurogenic claudication     Past Surgical History  Procedure Date  . Finger surgery     Right 4th finger  . Tonsillectomy childhood  . Lumbar spine surgery 06/2005    stenosis repair  . Total knee arthroplasty 06/2008    bilat (Dr. Marry Guan)  . Cataract extraction, bilateral 2011    Dr. Thomasene Ripple  . Lih 10/2009    Dr. Bary Castilla    Family History  Problem Relation Age of Onset  . Other Father     "clogged arteries"  . Hypertension Mother     History   Social History  . Marital Status: Widowed    Spouse Name: N/A    Number of Children: 2  . Years of Education: N/A   Occupational History  . Retired    Social History Main Topics  . Smoking status: Former Smoker    Quit date: 02/28/1970  . Smokeless tobacco: Never Used  . Alcohol Use: Yes     1-2 glasses wine/daily  . Drug Use: No  . Sexually Active: Not on file   Other Topics Concern  . 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 No distress.  Musculoskeletal:       Tenderness along volar 3rd MCP and pain with passive flexion Normal active extension now (3rd finger helps)  Neurological:       Mild decreased grip strength on left          Assessment & Plan:

## 2011-02-01 NOTE — Assessment & Plan Note (Signed)
Seems to be multiple factors Seems to have inflammation in flexion tendon sheath as well as ?some CTS Now with some weakness  Will set up hand eval Is nationally ranked in tennis for his age and he is left handed Needs thorough eval since he uses it so much

## 2011-02-14 ENCOUNTER — Other Ambulatory Visit: Payer: Self-pay | Admitting: *Deleted

## 2011-02-14 MED ORDER — OMEPRAZOLE 20 MG PO CPDR: 20.0000 mg | DELAYED_RELEASE_CAPSULE | Freq: Every day | ORAL | Status: DC

## 2011-02-16 ENCOUNTER — Telehealth: Payer: Self-pay | Admitting: Internal Medicine

## 2011-02-16 NOTE — Telephone Encounter (Signed)
Patient called and stated he had a hacking cough, low grad fever, congested and he stated he slept for 12 hours and today the only symptoms he is having is weakness and lightheadedness and would like to know what your advice is.

## 2011-02-17 ENCOUNTER — Telehealth: Payer: Self-pay | Admitting: Internal Medicine

## 2011-02-17 NOTE — Telephone Encounter (Signed)
Patient had left another message for me to return his call, I left the message on his VM and advised him to return my call if he has any questions or problems.

## 2011-02-17 NOTE — Telephone Encounter (Signed)
Sounds like the respiratory illness that has been going around Supportive care with tylenol or ibuprofen. Can use OTC cough syrup or honey for cough See if sig fever, SOB or not improving by next week

## 2011-02-17 NOTE — Telephone Encounter (Signed)
Returned Dee's call.  Call was disconnected.  No detail provided.  Please call pt

## 2011-02-17 NOTE — Telephone Encounter (Signed)
.  left message to have patient return my call.  

## 2011-03-03 DIAGNOSIS — G5600 Carpal tunnel syndrome, unspecified upper limb: Secondary | ICD-10-CM | POA: Diagnosis not present

## 2011-03-08 ENCOUNTER — Other Ambulatory Visit: Payer: Self-pay | Admitting: *Deleted

## 2011-03-08 MED ORDER — PRAMIPEXOLE DIHYDROCHLORIDE 1 MG PO TABS: 1.0000 mg | ORAL_TABLET | Freq: Every day | ORAL | Status: DC

## 2011-03-09 DIAGNOSIS — D0460 Carcinoma in situ of skin of unspecified upper limb, including shoulder: Secondary | ICD-10-CM | POA: Diagnosis not present

## 2011-03-09 DIAGNOSIS — D234 Other benign neoplasm of skin of scalp and neck: Secondary | ICD-10-CM | POA: Diagnosis not present

## 2011-03-16 ENCOUNTER — Ambulatory Visit (INDEPENDENT_AMBULATORY_CARE_PROVIDER_SITE_OTHER): Payer: Medicare Other | Admitting: Internal Medicine

## 2011-03-16 ENCOUNTER — Encounter: Payer: Self-pay | Admitting: Internal Medicine

## 2011-03-16 DIAGNOSIS — R209 Unspecified disturbances of skin sensation: Secondary | ICD-10-CM | POA: Diagnosis not present

## 2011-03-16 DIAGNOSIS — E785 Hyperlipidemia, unspecified: Secondary | ICD-10-CM

## 2011-03-16 DIAGNOSIS — R200 Anesthesia of skin: Secondary | ICD-10-CM

## 2011-03-16 NOTE — Progress Notes (Signed)
Subjective:    Patient ID: Stephen Garrett, male    DOB: [birth year], 78 y.o.   MRN: [id number]  HPI Saw hand surgeon Got injection and nerve studies confirmed CTS Holding off on surgery for now  Concerned about numbness in legs under knees occ swelling Had spinal stenosis surgery some years ago  Stopped the simvastatin in case it was related Feels it is some better now  Current Outpatient Prescriptions on File Prior to Visit  Medication Sig Dispense Refill  . clonazePAM (KLONOPIN) 0.5 MG tablet Take 1-2 tablets by mouth at bedtime as needed  180 tablet  0  . doxazosin (CARDURA) 4 MG tablet Take 1.5 tablets (6 mg total) by mouth at bedtime.  135 tablet  3  . finasteride (PROSCAR) 5 MG tablet Take 1 tablet (5 mg total) by mouth daily.  90 tablet  3  . fish oil-omega-3 fatty acids 1000 MG capsule Take 1 g by mouth 3 (three) times daily.        . hydrochlorothiazide 25 MG tablet Take 1 tablet (25 mg total) by mouth daily.  90 tablet  3  . Multiple Vitamin (MULTIVITAMIN) capsule Take 1 capsule by mouth daily.        Marland Kitchen omeprazole (PRILOSEC) 20 MG capsule Take 1 capsule (20 mg total) by mouth daily.  90 capsule  3  . pramipexole (MIRAPEX) 1 MG tablet Take 1-2 tablets (1-2 mg total) by mouth at bedtime. For restless legs  180 tablet  0  . simvastatin (ZOCOR) 40 MG tablet Take 20 mg by mouth at bedtime.          Allergies  Allergen Reactions  . Atorvastatin     REACTION: raises blood pressure  . Pravastatin Sodium     REACTION: raises blood pressure  . Statins     REACTION: raises bp    Past Medical History  Diagnosis Date  . Diverticulosis of colon 06/2003  . GERD (gastroesophageal reflux disease)   . HLD (hyperlipidemia)   . HTN (hypertension)   . BPH (benign prostatic hypertrophy)   . Osteoarthritis of knee     severe, bilat  . Renal insufficiency   . Actinic keratosis   . Diseases of lips   . Psychosexual dysfunction with inhibited sexual excitement   . Restless legs  syndrome (RLS)   . Sleep disturbance, unspecified   . Spinal stenosis, lumbar region, without neurogenic claudication     Past Surgical History  Procedure Date  . Finger surgery     Right 4th finger  . Tonsillectomy childhood  . Lumbar spine surgery 06/2005    stenosis repair  . Total knee arthroplasty 06/2008    bilat (Dr. Marry Guan)  . Cataract extraction, bilateral 2011    Dr. Thomasene Ripple  . Lih 10/2009    Dr. Bary Castilla    Family History  Problem Relation Age of Onset  . Other Father     "clogged arteries"  . Hypertension Mother     History   Social History  . Marital Status: Widowed    Spouse Name: N/A    Number of Children: 2  . Years of Education: N/A   Occupational History  . Retired    Social History Main Topics  . Smoking status: Former Smoker    Quit date: 02/28/1970  . Smokeless tobacco: Never Used  . Alcohol Use: Yes     1-2 glasses wine/daily  . Drug Use: No  . Sexually Active: Not on file   Other Topics  Concern  . 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 No distress.  Cardiovascular: Intact distal pulses.   Neurological:       Mild decreased light touch in both feet          Assessment & Plan:

## 2011-03-16 NOTE — Assessment & Plan Note (Signed)
Seems to have neuropathy in feet No vascular issues He feels he may be better off the simvastatin  Likely related to his past spinal stenosis No weakness

## 2011-03-16 NOTE — Patient Instructions (Signed)
Cancel February visit

## 2011-03-16 NOTE — Assessment & Plan Note (Signed)
Due to [place], he will probably want to try another statin

## 2011-03-29 DIAGNOSIS — Z96659 Presence of unspecified artificial knee joint: Secondary | ICD-10-CM | POA: Diagnosis not present

## 2011-04-20 ENCOUNTER — Ambulatory Visit: Payer: Medicare Other | Admitting: Internal Medicine

## 2011-04-22 ENCOUNTER — Ambulatory Visit (INDEPENDENT_AMBULATORY_CARE_PROVIDER_SITE_OTHER): Payer: Medicare Other | Admitting: Internal Medicine

## 2011-04-22 ENCOUNTER — Encounter: Payer: Self-pay | Admitting: Internal Medicine

## 2011-04-22 DIAGNOSIS — F341 Dysthymic disorder: Secondary | ICD-10-CM

## 2011-04-22 DIAGNOSIS — I10 Essential (primary) hypertension: Secondary | ICD-10-CM

## 2011-04-22 DIAGNOSIS — E785 Hyperlipidemia, unspecified: Secondary | ICD-10-CM | POA: Diagnosis not present

## 2011-04-22 MED ORDER — CITALOPRAM HYDROBROMIDE 10 MG PO TABS: 10.0000 mg | ORAL_TABLET | Freq: Every day | ORAL | Status: DC

## 2011-04-22 NOTE — Assessment & Plan Note (Signed)
Wasn't happy with the simvastatin I am not excited about primary prevention at his age Will discuss again at subsequent visits
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
## 2011-04-22 NOTE — Assessment & Plan Note (Signed)
BP Readings from Last 3 Encounters:  04/22/11 178/98  03/16/11 160/80  02/01/11 198/94   He is sure this is white coat HTN Will plan to add lisinopril if not better next time

## 2011-04-22 NOTE — Assessment & Plan Note (Signed)
Clearly not major depression Not interested in trying psychologist again Really wants to try medication  I explained that use of meds with dysthymia is not clear cut After discussion, will try low dose citalopram

## 2011-05-02 ENCOUNTER — Other Ambulatory Visit: Payer: Self-pay | Admitting: *Deleted

## 2011-05-02 MED ORDER — CLONAZEPAM 0.5 MG PO TABS: ORAL_TABLET | ORAL | Status: DC

## 2011-05-02 NOTE — Telephone Encounter (Signed)
rx called into pharmacy

## 2011-05-02 NOTE — Telephone Encounter (Signed)
Okay #180 x 0 

## 2011-05-06 DIAGNOSIS — L259 Unspecified contact dermatitis, unspecified cause: Secondary | ICD-10-CM | POA: Diagnosis not present

## 2011-05-13 ENCOUNTER — Telehealth: Payer: Self-pay | Admitting: Internal Medicine

## 2011-05-13 NOTE — Telephone Encounter (Signed)
I was not really sure a medication would help him --since he really doesn't have major depression. If he is having a side effect, we should stop the med, and wait to discuss this again at his follow up visit.  We can then discuss if a trial with another med is worthwhile  Make sure he has appt scheduled

## 2011-05-13 NOTE — Telephone Encounter (Addendum)
Pt walk in. He is on Citalopram HBR10mg , for depression. Says this medication is affecting his sex life. Would like to try another medication.   Pharmacy, [facility] Call back # [phone]   Sp w/ Dr. Damita Dunnings on today at 2:30pm, says Dr. Silvio Pate would have to address pt's issue on Monday. Called pt left message on answering machine.

## 2011-05-17 NOTE — Telephone Encounter (Signed)
Left message for pt that we will discuss at his OV on 05/18/11.

## 2011-05-18 ENCOUNTER — Encounter: Payer: Self-pay | Admitting: Internal Medicine

## 2011-05-18 ENCOUNTER — Ambulatory Visit (INDEPENDENT_AMBULATORY_CARE_PROVIDER_SITE_OTHER): Payer: Medicare Other | Admitting: Internal Medicine

## 2011-05-18 VITALS — BP 160/90 | HR 58 | Temp 97.3°F | Ht 67.0 in | Wt 181.0 lb

## 2011-05-18 DIAGNOSIS — F341 Dysthymic disorder: Secondary | ICD-10-CM | POA: Diagnosis not present

## 2011-05-18 DIAGNOSIS — I10 Essential (primary) hypertension: Secondary | ICD-10-CM

## 2011-05-18 MED ORDER — BUPROPION HCL ER (XL) 150 MG PO TB24: 150.0000 mg | ORAL_TABLET | Freq: Every day | ORAL | Status: DC

## 2011-05-18 MED ORDER — LOSARTAN POTASSIUM-HCTZ 50-12.5 MG PO TABS: 1.0000 | ORAL_TABLET | Freq: Every day | ORAL | Status: DC

## 2011-05-18 NOTE — Patient Instructions (Signed)
Please stop the HCTZ and substitute the losartan/HCT

## 2011-05-18 NOTE — Progress Notes (Signed)
Subjective:    Patient ID: Stephen Garrett, male    DOB: 11/20/1923, 78 y.o.   MRN: [id number]  HPI Stopped the citalopram Caused ED  Ongoing mild mood problems Clearly not MDD Discussed unclear indication for medications He really wants to try something else  Ongoing elevated BP Checks at home Ranges from 137/83 to 196/103 Most are over 0000000 systolic  No chest pain No headaches  Current Outpatient Prescriptions on File Prior to Visit  Medication Sig Dispense Refill  . clonazePAM (KLONOPIN) 0.5 MG tablet Take 1-2 tablets by mouth at bedtime as needed  180 tablet  0  . doxazosin (CARDURA) 4 MG tablet Take 1.5 tablets (6 mg total) by mouth at bedtime.  135 tablet  3  . finasteride (PROSCAR) 5 MG tablet Take 1 tablet (5 mg total) by mouth daily.  90 tablet  3  . fish oil-omega-3 fatty acids 1000 MG capsule Take 1 g by mouth 3 (three) times daily.        . hydrochlorothiazide 25 MG tablet Take 1 tablet (25 mg total) by mouth daily.  90 tablet  3  . Multiple Vitamin (MULTIVITAMIN) capsule Take 1 capsule by mouth daily.        Marland Kitchen omeprazole (PRILOSEC) 20 MG capsule Take 1 capsule (20 mg total) by mouth daily.  90 capsule  3  . pramipexole (MIRAPEX) 1 MG tablet Take 1-2 tablets (1-2 mg total) by mouth at bedtime. For restless legs  180 tablet  0    Allergies  Allergen Reactions  . Atorvastatin     REACTION: raises blood pressure  . Pravastatin Sodium     REACTION: raises blood pressure  . Statins     REACTION: raises bp    Past Medical History  Diagnosis Date  . Diverticulosis of colon 06/2003  . GERD (gastroesophageal reflux disease)   . HLD (hyperlipidemia)   . HTN (hypertension)   . BPH (benign prostatic hypertrophy)   . Osteoarthritis of knee     severe, bilat  . Renal insufficiency   . Actinic keratosis   . Diseases of lips   . Psychosexual dysfunction with inhibited sexual excitement   . Restless legs syndrome (RLS)   . Sleep disturbance, unspecified   . Spinal  stenosis, lumbar region, without neurogenic claudication     Past Surgical History  Procedure Date  . Finger surgery     Right 4th finger  . Tonsillectomy childhood  . Lumbar spine surgery 06/2005    stenosis repair  . Total knee arthroplasty 06/2008    bilat (Dr. Marry Guan)  . Cataract extraction, bilateral 2011    Dr. Thomasene Ripple  . Lih 10/2009    Dr. Bary Castilla    Family History  Problem Relation Age of Onset  . Other Father     "clogged arteries"  . Hypertension Mother     History   Social History  . Marital Status: Widowed    Spouse Name: N/A    Number of Children: 2  . Years of Education: N/A   Occupational History  . Retired    Social History Main Topics  . Smoking status: Former Smoker    Quit date: 02/28/1970  . Smokeless tobacco: Never Used  . Alcohol Use: Yes     1-2 glasses wine/daily  . Drug Use: No  . Sexually Active: Not on file   Other Topics Concern  . 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 No distress.  Psychiatric: He has a normal mood and affect. His behavior is normal.          Assessment & Plan:

## 2011-05-18 NOTE — Assessment & Plan Note (Signed)
BP Readings from Last 3 Encounters:  05/18/11 160/90  04/22/11 178/98  03/16/11 160/80   Persistent elevations Will add losartan

## 2011-05-18 NOTE — Assessment & Plan Note (Signed)
Sexual side effects with citalopram Not interested in counseling Really wants to try something else  Not enthusiastic with meds but will try low dose bupropion

## 2011-05-23 DIAGNOSIS — H16209 Unspecified keratoconjunctivitis, unspecified eye: Secondary | ICD-10-CM | POA: Diagnosis not present

## 2011-05-30 ENCOUNTER — Telehealth: Payer: Self-pay | Admitting: *Deleted

## 2011-05-30 DIAGNOSIS — H16209 Unspecified keratoconjunctivitis, unspecified eye: Secondary | ICD-10-CM | POA: Diagnosis not present

## 2011-05-30 MED ORDER — PRAMIPEXOLE DIHYDROCHLORIDE 1 MG PO TABS: 1.0000 mg | ORAL_TABLET | Freq: Every day | ORAL | Status: DC

## 2011-05-30 NOTE — Telephone Encounter (Signed)
.  left message to have patient return my call.  

## 2011-05-30 NOTE — Telephone Encounter (Signed)
The diuretic medication in this that could cause dry eye is the same one he has already been on. He should use the med and just call if he notes a major change Just check and see if he was having major dry eye problems before also

## 2011-05-30 NOTE — Telephone Encounter (Signed)
Pt states he seen a optometrist and the new bp med Dr. Silvio Pate prescribed,  Losartan- HCTZ, per the optometrist is causing pt to loose water in his eyes, pt states he stopped taking medication and would like a new bp med, please advise.

## 2011-05-31 NOTE — Telephone Encounter (Signed)
Patient called back and after he talked with the optometrist he decided to continue the medication, per pt it's the HCTZ that giving him the dry eye, the optometrist suggested he continue medication that Dr. Silvio Pate prescribed until he has some sort of side effect. Pt states he will continue the losartan/ HCTZ.

## 2011-05-31 NOTE — Telephone Encounter (Signed)
.  left message to have patient return my call.  

## 2011-05-31 NOTE — Telephone Encounter (Signed)
Patient called back and I advised results, per pt he thinks a calcium channel blocker will help like it did before, pt states she wasn't having dry eye until he started the losartan, per pt he's been on HCTZ for years and never had a problem, pt states he was having problems with allergies, pollen and no water in his eyes and states it was the losartan, not the HCTZ, pt would like another medication. Please advise

## 2011-05-31 NOTE — Telephone Encounter (Signed)
Okay  Put the losartan as side effect---dry eye and stop it Start HCTZ 25mg  daily and amlodipine 5mg  daily 1 year of each  Set up appt in 4-6 weeks to check BP if he doesn't have appt

## 2011-06-01 NOTE — Telephone Encounter (Signed)
Okay  I knew it was the HCTZ and he has been on this for some time

## 2011-06-08 DIAGNOSIS — D234 Other benign neoplasm of skin of scalp and neck: Secondary | ICD-10-CM | POA: Diagnosis not present

## 2011-06-08 DIAGNOSIS — L570 Actinic keratosis: Secondary | ICD-10-CM | POA: Diagnosis not present

## 2011-06-08 DIAGNOSIS — Z85828 Personal history of other malignant neoplasm of skin: Secondary | ICD-10-CM | POA: Diagnosis not present

## 2011-06-08 DIAGNOSIS — D485 Neoplasm of uncertain behavior of skin: Secondary | ICD-10-CM | POA: Diagnosis not present

## 2011-06-24 ENCOUNTER — Telehealth: Payer: Self-pay

## 2011-06-24 NOTE — Telephone Encounter (Signed)
These are not really worrisome Let's wait till next week's visit and then can decide if a change is appropriate

## 2011-06-24 NOTE — Telephone Encounter (Signed)
Patient advised.

## 2011-06-24 NOTE — Telephone Encounter (Signed)
Pt started Losartan HCTZ 50-12.5 mg on 05/18/11; BP had lowered but last 2 weeks BP range 150-170 / 90-100. No h/a,no dizziness, no chest pain and no SOB. Pt has appt with Dr Silvio Pate 06/29/11 but pt wants to know if Dr Silvio Pate wants to change Losartan HCTZ dosage or instructions prior to appt. Pt uses [employer] and pt can be reached at [phone].

## 2011-06-28 DIAGNOSIS — H169 Unspecified keratitis: Secondary | ICD-10-CM | POA: Diagnosis not present

## 2011-06-29 ENCOUNTER — Ambulatory Visit: Payer: Medicare Other | Admitting: Internal Medicine

## 2011-06-29 ENCOUNTER — Encounter: Payer: Self-pay | Admitting: *Deleted

## 2011-06-29 ENCOUNTER — Encounter: Payer: Self-pay | Admitting: Internal Medicine

## 2011-06-29 ENCOUNTER — Ambulatory Visit (INDEPENDENT_AMBULATORY_CARE_PROVIDER_SITE_OTHER): Payer: Medicare Other | Admitting: Internal Medicine

## 2011-06-29 VITALS — BP 168/90 | HR 56 | Temp 97.7°F | Ht 67.0 in | Wt 183.0 lb

## 2011-06-29 DIAGNOSIS — E785 Hyperlipidemia, unspecified: Secondary | ICD-10-CM | POA: Diagnosis not present

## 2011-06-29 DIAGNOSIS — N400 Enlarged prostate without lower urinary tract symptoms: Secondary | ICD-10-CM | POA: Diagnosis not present

## 2011-06-29 DIAGNOSIS — F341 Dysthymic disorder: Secondary | ICD-10-CM

## 2011-06-29 DIAGNOSIS — I10 Essential (primary) hypertension: Secondary | ICD-10-CM | POA: Diagnosis not present

## 2011-06-29 LAB — BASIC METABOLIC PANEL
BUN: 26 mg/dL — ABNORMAL HIGH (ref 6–23)
CO2: 29 mEq/L (ref 19–32)
Calcium: 9.2 mg/dL (ref 8.4–10.5)
Chloride: 104 mEq/L (ref 96–112)
Creatinine, Ser: 1.3 mg/dL (ref 0.4–1.5)
GFR: 57.17 mL/min — ABNORMAL LOW (ref >60.00)
Glucose, Bld: 98 mg/dL (ref 70–99)
Potassium: 4 mEq/L (ref 3.5–5.1)
Sodium: 141 mEq/L (ref 135–145)

## 2011-06-29 MED ORDER — BUPROPION HCL ER (XL) 150 MG PO TB24: 150.0000 mg | ORAL_TABLET | Freq: Every day | ORAL | Status: DC

## 2011-06-29 MED ORDER — LOSARTAN POTASSIUM-HCTZ 100-25 MG PO TABS: 1.0000 | ORAL_TABLET | Freq: Every day | ORAL | Status: DC

## 2011-06-29 NOTE — Assessment & Plan Note (Signed)
He feels better on the low dose bupropion Will continue

## 2011-06-29 NOTE — Assessment & Plan Note (Signed)
BP Readings from Last 3 Encounters:  06/29/11 168/90  05/18/11 160/90  04/22/11 178/98   No problems with med Home BP better but still up here 152/98 on right by me Will double the losartan (no side effects thus far)

## 2011-06-29 NOTE — Assessment & Plan Note (Signed)
Some myalgia with the simvastatin Not sure he needs it He will consider using 1-2 per week

## 2011-06-29 NOTE — Progress Notes (Signed)
Subjective:    Patient ID: Stephen Garrett, male    DOB: 1923/03/22, 78 y.o.   MRN: [id number]  HPI Feels his BP measurements may have been incorrect Had lady friend use his machine and got 135/80's  No headaches No chest pain or SOB No dizziness or syncope No edema  Has done okay with the bupropion Doesn't "sense that edge of depression...Marland KitchenMarland KitchenMarland Kitchen Sharpens my view of life" No problems with sexual function  Has been off simvastatin He feels his legs are better like that Discussed primary prevention and uncertainty at his age  Has been voiding okay  Current Outpatient Prescriptions on File Prior to Visit  Medication Sig Dispense Refill  . buPROPion (WELLBUTRIN XL) 150 MG 24 hr tablet Take 1 tablet (150 mg total) by mouth daily.  30 tablet  5  . clonazePAM (KLONOPIN) 0.5 MG tablet Take 1-2 tablets by mouth at bedtime as needed  180 tablet  0  . doxazosin (CARDURA) 4 MG tablet Take 1.5 tablets (6 mg total) by mouth at bedtime.  135 tablet  3  . finasteride (PROSCAR) 5 MG tablet Take 1 tablet (5 mg total) by mouth daily.  90 tablet  3  . fish oil-omega-3 fatty acids 1000 MG capsule Take 1 g by mouth 3 (three) times daily.        Marland Kitchen losartan-hydrochlorothiazide (HYZAAR) 50-12.5 MG per tablet Take 1 tablet by mouth daily.      . Multiple Vitamin (MULTIVITAMIN) capsule Take 1 capsule by mouth daily.        Marland Kitchen omeprazole (PRILOSEC) 20 MG capsule Take 1 capsule (20 mg total) by mouth daily.  90 capsule  3  . pramipexole (MIRAPEX) 1 MG tablet Take 1-2 tablets (1-2 mg total) by mouth at bedtime. For restless legs  180 tablet  0  . DISCONTD: citalopram (CELEXA) 10 MG tablet Take 1 tablet (10 mg total) by mouth daily.  30 tablet  5    Allergies  Allergen Reactions  . Atorvastatin     REACTION: raises blood pressure  . Pravastatin Sodium     REACTION: raises blood pressure  . Statins     REACTION: raises bp    Past Medical History  Diagnosis Date  . Diverticulosis of colon 06/2003  . GERD  (gastroesophageal reflux disease)   . HLD (hyperlipidemia)   . HTN (hypertension)   . BPH (benign prostatic hypertrophy)   . Osteoarthritis of knee     severe, bilat  . Renal insufficiency   . Actinic keratosis   . Diseases of lips   . Psychosexual dysfunction with inhibited sexual excitement   . Restless legs syndrome (RLS)   . Sleep disturbance, unspecified   . Spinal stenosis, lumbar region, without neurogenic claudication     Past Surgical History  Procedure Date  . Finger surgery     Right 4th finger  . Tonsillectomy childhood  . Lumbar spine surgery 06/2005    stenosis repair  . Total knee arthroplasty 06/2008    bilat (Dr. Marry Guan)  . Cataract extraction, bilateral 2011    Dr. Thomasene Ripple  . Lih 10/2009    Dr. Bary Castilla    Family History  Problem Relation Age of Onset  . Other Father     "clogged arteries"  . Hypertension Mother     History   Social History  . Marital Status: Widowed    Spouse Name: N/A    Number of Children: 2  . Years of Education: N/A   Occupational History  .  Retired    Social History Main Topics  . Smoking status: Former Smoker    Quit date: 02/28/1970  . Smokeless tobacco: Never Used  . Alcohol Use: Yes     1-2 glasses wine/daily  . Drug Use: No  . Sexually Active: Not on file   Other Topics Concern  . 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 No distress.  Neck: Normal range of motion. Neck supple.  Cardiovascular: Normal rate, regular rhythm and normal heart sounds.  Exam reveals no gallop.   No murmur heard. Pulmonary/Chest: Effort normal and breath sounds normal. No respiratory distress. He has no wheezes. He has no rales.  Musculoskeletal: He exhibits no edema and no tenderness.    Lymphadenopathy:    He has no cervical adenopathy.  Skin:       Clean skin tear on right calf---no inflammation  Psychiatric: He has a normal mood and affect. His behavior is normal.          Assessment & Plan:

## 2011-06-29 NOTE — Assessment & Plan Note (Signed)
Voiding okay Plans to decrease doxazosin to 4mg  daily

## 2011-07-04 DIAGNOSIS — H169 Unspecified keratitis: Secondary | ICD-10-CM | POA: Diagnosis not present

## 2011-07-26 ENCOUNTER — Encounter: Payer: Self-pay | Admitting: Internal Medicine

## 2011-07-26 ENCOUNTER — Ambulatory Visit (INDEPENDENT_AMBULATORY_CARE_PROVIDER_SITE_OTHER): Payer: Medicare Other | Admitting: Internal Medicine

## 2011-07-26 ENCOUNTER — Other Ambulatory Visit: Payer: Self-pay | Admitting: *Deleted

## 2011-07-26 VITALS — BP 158/90 | HR 73 | Wt 183.0 lb

## 2011-07-26 DIAGNOSIS — IMO0002 Reserved for concepts with insufficient information to code with codable children: Secondary | ICD-10-CM

## 2011-07-26 MED ORDER — CLONAZEPAM 0.5 MG PO TABS: ORAL_TABLET | ORAL | Status: DC

## 2011-07-26 NOTE — Assessment & Plan Note (Signed)
Bilateral but right more than left Will proceed with PT evaluation

## 2011-07-26 NOTE — Progress Notes (Signed)
Subjective:    Patient ID: Stephen Garrett, male    DOB: 12/06/1923, 78 y.o.   MRN: [id number]  HPI Flew to [place] for hard court tournament--usually plays on clay Went on for 2 days--ended 10-12 days ago Felt okay after this but next day developed alternating spasms in both legs Starts in buttocks and thighs and goes down legs  Has tried massage---hasn't helped much Is able to sleep Wants to try PT before next tournament in 2 weeks  Current Outpatient Prescriptions on File Prior to Visit  Medication Sig Dispense Refill  . aspirin 81 MG tablet Take 81 mg by mouth daily.      Marland Kitchen buPROPion (WELLBUTRIN XL) 150 MG 24 hr tablet Take 1 tablet (150 mg total) by mouth daily.  90 tablet  3  . clonazePAM (KLONOPIN) 0.5 MG tablet Take 1-2 tablets by mouth at bedtime as needed  180 tablet  0  . doxazosin (CARDURA) 4 MG tablet Take 1.5 tablets (6 mg total) by mouth at bedtime.  135 tablet  3  . finasteride (PROSCAR) 5 MG tablet Take 1 tablet (5 mg total) by mouth daily.  90 tablet  3  . fish oil-omega-3 fatty acids 1000 MG capsule Take 1 g by mouth 3 (three) times daily.        Marland Kitchen losartan-hydrochlorothiazide (HYZAAR) 100-25 MG per tablet Take 1 tablet by mouth daily.  90 tablet  3  . Multiple Vitamin (MULTIVITAMIN) capsule Take 1 capsule by mouth daily.        Marland Kitchen omeprazole (PRILOSEC) 20 MG capsule Take 1 capsule (20 mg total) by mouth daily.  90 capsule  3  . pramipexole (MIRAPEX) 1 MG tablet Take 1-2 tablets (1-2 mg total) by mouth at bedtime. For restless legs  180 tablet  0  . DISCONTD: citalopram (CELEXA) 10 MG tablet Take 1 tablet (10 mg total) by mouth daily.  30 tablet  5    Allergies  Allergen Reactions  . Atorvastatin     REACTION: raises blood pressure  . Pravastatin Sodium     REACTION: raises blood pressure  . Statins     REACTION: raises bp    Past Medical History  Diagnosis Date  . Diverticulosis of colon 06/2003  . GERD (gastroesophageal reflux disease)   . HLD  (hyperlipidemia)   . HTN (hypertension)   . BPH (benign prostatic hypertrophy)   . Osteoarthritis of knee     severe, bilat  . Renal insufficiency   . Actinic keratosis   . Diseases of lips   . Psychosexual dysfunction with inhibited sexual excitement   . Restless legs syndrome (RLS)   . Sleep disturbance, unspecified   . Spinal stenosis, lumbar region, without neurogenic claudication     Past Surgical History  Procedure Date  . Finger surgery     Right 4th finger  . Tonsillectomy childhood  . Lumbar spine surgery 06/2005    stenosis repair  . Total knee arthroplasty 06/2008    bilat (Dr. Marry Guan)  . Cataract extraction, bilateral 2011    Dr. Thomasene Ripple  . Lih 10/2009    Dr. Bary Castilla    Family History  Problem Relation Age of Onset  . Other Father     "clogged arteries"  . Hypertension Mother     History   Social History  . Marital Status: Widowed    Spouse Name: N/A    Number of Children: 2  . Years of Education: N/A   Occupational History  . Retired  Social History Main Topics  . Smoking status: Former Smoker    Quit date: 02/28/1970  . Smokeless tobacco: Never Used  . Alcohol Use: Yes     1-2 glasses wine/daily  . Drug Use: No  . Sexually Active: Not on file   Other Topics Concern  . 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 No distress.  Musculoskeletal:       Some pain in right calf with hip ROM No hip or knee pain or swelling  Neurological:       Normal strength in legs Stiff gait but normal weight bearing          Assessment & Plan:

## 2011-07-26 NOTE — Telephone Encounter (Signed)
rx called into pharmacy

## 2011-07-26 NOTE — Telephone Encounter (Signed)
Okay #180 x 0 

## 2011-07-27 ENCOUNTER — Telehealth: Payer: Self-pay | Admitting: *Deleted

## 2011-07-27 NOTE — Telephone Encounter (Signed)
Have him proceed with the physical therapist and they can evaluate his knees If the therapist recommends, he should then go see Dr Marry Guan

## 2011-07-27 NOTE — Telephone Encounter (Signed)
Pt was seen yesterday and forgot to ask if you thought it is possible if his knee replacements could be damaged and if he should f/u with Dr Marry Guan for x rays to evaluate if they are possibly damaged.

## 2011-07-27 NOTE — Telephone Encounter (Signed)
Patient notified as instructed by telephone. 

## 2011-07-28 DIAGNOSIS — M2420 Disorder of ligament, unspecified site: Secondary | ICD-10-CM | POA: Diagnosis not present

## 2011-07-28 DIAGNOSIS — M629 Disorder of muscle, unspecified: Secondary | ICD-10-CM | POA: Diagnosis not present

## 2011-07-28 DIAGNOSIS — M25559 Pain in unspecified hip: Secondary | ICD-10-CM | POA: Diagnosis not present

## 2011-07-28 DIAGNOSIS — M2560 Stiffness of unspecified joint, not elsewhere classified: Secondary | ICD-10-CM | POA: Diagnosis not present

## 2011-07-29 DIAGNOSIS — M79609 Pain in unspecified limb: Secondary | ICD-10-CM | POA: Diagnosis not present

## 2011-07-29 DIAGNOSIS — M25569 Pain in unspecified knee: Secondary | ICD-10-CM | POA: Diagnosis not present

## 2011-08-02 DIAGNOSIS — M2560 Stiffness of unspecified joint, not elsewhere classified: Secondary | ICD-10-CM | POA: Diagnosis not present

## 2011-08-02 DIAGNOSIS — M25559 Pain in unspecified hip: Secondary | ICD-10-CM | POA: Diagnosis not present

## 2011-08-02 DIAGNOSIS — M629 Disorder of muscle, unspecified: Secondary | ICD-10-CM | POA: Diagnosis not present

## 2011-08-02 DIAGNOSIS — M2420 Disorder of ligament, unspecified site: Secondary | ICD-10-CM | POA: Diagnosis not present

## 2011-08-04 DIAGNOSIS — M629 Disorder of muscle, unspecified: Secondary | ICD-10-CM | POA: Diagnosis not present

## 2011-08-04 DIAGNOSIS — M2420 Disorder of ligament, unspecified site: Secondary | ICD-10-CM | POA: Diagnosis not present

## 2011-08-04 DIAGNOSIS — M25559 Pain in unspecified hip: Secondary | ICD-10-CM | POA: Diagnosis not present

## 2011-08-04 DIAGNOSIS — M2560 Stiffness of unspecified joint, not elsewhere classified: Secondary | ICD-10-CM | POA: Diagnosis not present

## 2011-08-08 DIAGNOSIS — M25659 Stiffness of unspecified hip, not elsewhere classified: Secondary | ICD-10-CM | POA: Diagnosis not present

## 2011-08-08 DIAGNOSIS — M25559 Pain in unspecified hip: Secondary | ICD-10-CM | POA: Diagnosis not present

## 2011-08-08 DIAGNOSIS — M169 Osteoarthritis of hip, unspecified: Secondary | ICD-10-CM | POA: Diagnosis not present

## 2011-08-08 DIAGNOSIS — M659 Synovitis and tenosynovitis, unspecified: Secondary | ICD-10-CM | POA: Diagnosis not present

## 2011-08-08 DIAGNOSIS — M1610 Unilateral primary osteoarthritis, unspecified hip: Secondary | ICD-10-CM | POA: Diagnosis not present

## 2011-08-09 ENCOUNTER — Telehealth: Payer: Self-pay

## 2011-08-09 DIAGNOSIS — M2560 Stiffness of unspecified joint, not elsewhere classified: Secondary | ICD-10-CM | POA: Diagnosis not present

## 2011-08-09 DIAGNOSIS — M629 Disorder of muscle, unspecified: Secondary | ICD-10-CM | POA: Diagnosis not present

## 2011-08-09 DIAGNOSIS — M2420 Disorder of ligament, unspecified site: Secondary | ICD-10-CM | POA: Diagnosis not present

## 2011-08-09 DIAGNOSIS — M25559 Pain in unspecified hip: Secondary | ICD-10-CM | POA: Diagnosis not present

## 2011-08-09 NOTE — Telephone Encounter (Signed)
Pt left v/m saw Dr Drema Dallas [facility]; was advised to take Motrin for 1 month. Dr asked pt more than once if kidneys were OK. Pt advised kidneys OK but pt wants Dr Alla German opinion if safe to take Motrin for a month.

## 2011-08-09 NOTE — Telephone Encounter (Signed)
Left message on home answering machine with results, advised pt to call when he received the message.

## 2011-08-09 NOTE — Telephone Encounter (Signed)
Patient called back and stated he told us the wrong medication, it's mobic 15mg , please advise

## 2011-08-09 NOTE — Telephone Encounter (Signed)
i think it would be fine to try the ibuprofen for a month I don't recommend doses over 400mg  three times a day at his age   If needs longer, should check kidney function

## 2011-08-10 NOTE — Telephone Encounter (Signed)
That is about the same thing Okay to try that

## 2011-08-10 NOTE — Telephone Encounter (Signed)
Spoke with patient and advised results   

## 2011-08-11 DIAGNOSIS — M629 Disorder of muscle, unspecified: Secondary | ICD-10-CM | POA: Diagnosis not present

## 2011-08-11 DIAGNOSIS — M2560 Stiffness of unspecified joint, not elsewhere classified: Secondary | ICD-10-CM | POA: Diagnosis not present

## 2011-08-11 DIAGNOSIS — M2420 Disorder of ligament, unspecified site: Secondary | ICD-10-CM | POA: Diagnosis not present

## 2011-08-11 DIAGNOSIS — M25559 Pain in unspecified hip: Secondary | ICD-10-CM | POA: Diagnosis not present

## 2011-08-15 DIAGNOSIS — M2560 Stiffness of unspecified joint, not elsewhere classified: Secondary | ICD-10-CM | POA: Diagnosis not present

## 2011-08-15 DIAGNOSIS — M629 Disorder of muscle, unspecified: Secondary | ICD-10-CM | POA: Diagnosis not present

## 2011-08-15 DIAGNOSIS — M2420 Disorder of ligament, unspecified site: Secondary | ICD-10-CM | POA: Diagnosis not present

## 2011-08-15 DIAGNOSIS — M25559 Pain in unspecified hip: Secondary | ICD-10-CM | POA: Diagnosis not present

## 2011-08-17 DIAGNOSIS — M25559 Pain in unspecified hip: Secondary | ICD-10-CM | POA: Diagnosis not present

## 2011-08-17 DIAGNOSIS — M2420 Disorder of ligament, unspecified site: Secondary | ICD-10-CM | POA: Diagnosis not present

## 2011-08-17 DIAGNOSIS — M629 Disorder of muscle, unspecified: Secondary | ICD-10-CM | POA: Diagnosis not present

## 2011-08-17 DIAGNOSIS — M2560 Stiffness of unspecified joint, not elsewhere classified: Secondary | ICD-10-CM | POA: Diagnosis not present

## 2011-09-02 ENCOUNTER — Other Ambulatory Visit: Payer: Self-pay | Admitting: *Deleted

## 2011-09-02 MED ORDER — PRAMIPEXOLE DIHYDROCHLORIDE 1 MG PO TABS: 1.0000 mg | ORAL_TABLET | Freq: Every day | ORAL | Status: DC

## 2011-09-14 ENCOUNTER — Telehealth: Payer: Self-pay

## 2011-09-14 NOTE — Telephone Encounter (Signed)
It may be that he is getting dehydrated playing tennis in this heat For now, try taking just 1/2 of the losartan/HCTZ daily He probably shouldn't take it before tennis if he is going to be playing in the heat If his BP goes up on the lower dose, have him schedule an appt to review our options

## 2011-09-14 NOTE — Telephone Encounter (Signed)
Pt feeling fatigue all the time; when plays one set of tennis gets light headed. No pain or other symptoms noted. Pt links symptoms with starting Losartan-HCTZ. Recently pts BP averages 150/88. [place]. Please advise.

## 2011-09-14 NOTE — Telephone Encounter (Signed)
Dr Silvio Pate said no pt should not take Losartan-HCTZ today and pt to drink plenty of fluids. Pt will start Losartan-HCTZ 1/2 tab on 09/15/11; pt will call back if any changes or condition worsens. Pt wanted appt to see Dr Silvio Pate given on 09/16/11 at 9 am due to pts schedule he could not make sooner appt. Pt said will go to pharmacy today and get BP taken to make sure his cuff is accurate.

## 2011-09-14 NOTE — Telephone Encounter (Signed)
okay

## 2011-09-14 NOTE — Telephone Encounter (Signed)
Pt called back; has not taken Losartan-HCTZ this morning, just took BP  108/70 on recheck 111/68. Pt feels light headed and has slight h/a; no other symptoms noted.Please advise.

## 2011-09-14 NOTE — Telephone Encounter (Signed)
Pt has called back. Patient notified as instructed by telephone. Pt said just took BP again 94/58. Still has not taken Losartar HCTZ today; still has h/a and lightheaded. Should pt take Losartan-HCTZ 1/2 tab today?

## 2011-09-14 NOTE — Telephone Encounter (Signed)
Hold today Then go to 1/2 tab starting tomorrow

## 2011-09-15 ENCOUNTER — Other Ambulatory Visit: Payer: Self-pay | Admitting: *Deleted

## 2011-09-15 MED ORDER — FINASTERIDE 5 MG PO TABS: 5.0000 mg | ORAL_TABLET | Freq: Every day | ORAL | Status: DC

## 2011-09-16 ENCOUNTER — Ambulatory Visit: Payer: Medicare Other | Admitting: Internal Medicine

## 2011-09-30 ENCOUNTER — Encounter: Payer: Self-pay | Admitting: Internal Medicine

## 2011-09-30 ENCOUNTER — Other Ambulatory Visit: Payer: Self-pay

## 2011-09-30 ENCOUNTER — Other Ambulatory Visit: Payer: Self-pay | Admitting: *Deleted

## 2011-09-30 ENCOUNTER — Ambulatory Visit (INDEPENDENT_AMBULATORY_CARE_PROVIDER_SITE_OTHER): Payer: Medicare Other | Admitting: Internal Medicine

## 2011-09-30 VITALS — BP 138/82 | HR 74 | Temp 97.6°F | Ht 66.0 in | Wt 183.5 lb

## 2011-09-30 DIAGNOSIS — I10 Essential (primary) hypertension: Secondary | ICD-10-CM

## 2011-09-30 DIAGNOSIS — N400 Enlarged prostate without lower urinary tract symptoms: Secondary | ICD-10-CM | POA: Diagnosis not present

## 2011-09-30 DIAGNOSIS — G2581 Restless legs syndrome: Secondary | ICD-10-CM

## 2011-09-30 DIAGNOSIS — F341 Dysthymic disorder: Secondary | ICD-10-CM | POA: Diagnosis not present

## 2011-09-30 DIAGNOSIS — IMO0002 Reserved for concepts with insufficient information to code with codable children: Secondary | ICD-10-CM

## 2011-09-30 MED ORDER — PRAMIPEXOLE DIHYDROCHLORIDE 1 MG PO TABS: 1.0000 mg | ORAL_TABLET | Freq: Every day | ORAL | Status: DC

## 2011-09-30 NOTE — Assessment & Plan Note (Signed)
BP Readings from Last 3 Encounters:  09/30/11 138/82  07/26/11 158/90  06/29/11 168/90   Good control now No changes needed

## 2011-09-30 NOTE — Assessment & Plan Note (Signed)
Voiding fairly well Will continue the meds

## 2011-09-30 NOTE — Telephone Encounter (Signed)
Okay to refill for 1 year 90 day is fine

## 2011-09-30 NOTE — Assessment & Plan Note (Signed)
Mood is good He really has done well on the low dose bupropion Will continue

## 2011-09-30 NOTE — Assessment & Plan Note (Signed)
This has improved with the PT Discussed prudence with exercise Will avoid statins since he seemed to have muscle problems with this

## 2011-09-30 NOTE — Telephone Encounter (Signed)
Refill request from [facility] Patient wants 90 day supply. Ok to refill? Last refill 09/02/11.

## 2011-09-30 NOTE — Assessment & Plan Note (Signed)
Sleeps well with the pramipexole

## 2011-09-30 NOTE — Progress Notes (Signed)
Subjective:    Patient ID: Stephen Garrett, male    DOB: 07-18-1923, 78 y.o.   MRN: [id number]  HPI Doing fairly well Some trouble with trigger finger and CTS on left Sensitive and painful in the morning especially May need another cortisone shot Still plays tennis regularly  Legs are better Finished with PT for right IT band problems  BP is better Only taking 1/2 losartan now Only occ headaches No chest pain No SOB Stamina is good  Still happy with bupropion Keeps his mood better No sexual problems with this  Nocturia x 3 still No sig daytime urgency or frequency Current Outpatient Prescriptions on File Prior to Visit  Medication Sig Dispense Refill  . aspirin 81 MG tablet Take 81 mg by mouth daily.      Marland Kitchen buPROPion (WELLBUTRIN XL) 150 MG 24 hr tablet Take 1 tablet (150 mg total) by mouth daily.  90 tablet  3  . clonazePAM (KLONOPIN) 0.5 MG tablet Take 1-2 tablets by mouth at bedtime as needed  180 tablet  0  . doxazosin (CARDURA) 4 MG tablet Take 1.5 tablets (6 mg total) by mouth at bedtime.  135 tablet  3  . finasteride (PROSCAR) 5 MG tablet Take 1 tablet (5 mg total) by mouth daily.  90 tablet  3  . fish oil-omega-3 fatty acids 1000 MG capsule Take 1 g by mouth 3 (three) times daily.        . Multiple Vitamin (MULTIVITAMIN) capsule Take 1 capsule by mouth daily.        Marland Kitchen omeprazole (PRILOSEC) 20 MG capsule Take 1 capsule (20 mg total) by mouth daily.  90 capsule  3  . pramipexole (MIRAPEX) 1 MG tablet Take 1-2 tablets (1-2 mg total) by mouth at bedtime. For restless legs  180 tablet  0  . DISCONTD: losartan-hydrochlorothiazide (HYZAAR) 100-25 MG per tablet Take 1 tablet by mouth daily.  90 tablet  3  . DISCONTD: citalopram (CELEXA) 10 MG tablet Take 1 tablet (10 mg total) by mouth daily.  30 tablet  5    Allergies  Allergen Reactions  . Atorvastatin     REACTION: raises blood pressure  . Pravastatin Sodium     REACTION: raises blood pressure  . Statins     REACTION:  raises bp    Past Medical History  Diagnosis Date  . Diverticulosis of colon 06/2003  . GERD (gastroesophageal reflux disease)   . HLD (hyperlipidemia)   . HTN (hypertension)   . BPH (benign prostatic hypertrophy)   . Osteoarthritis of knee     severe, bilat  . Renal insufficiency   . Actinic keratosis   . Diseases of lips   . Psychosexual dysfunction with inhibited sexual excitement   . Restless legs syndrome (RLS)   . Sleep disturbance, unspecified   . Spinal stenosis, lumbar region, without neurogenic claudication     Past Surgical History  Procedure Date  . Finger surgery     Right 4th finger  . Tonsillectomy childhood  . Lumbar spine surgery 06/2005    stenosis repair  . Total knee arthroplasty 06/2008    bilat (Dr. Marry Guan)  . Cataract extraction, bilateral 2011    Dr. Thomasene Ripple  . Lih 10/2009    Dr. Bary Castilla    Family History  Problem Relation Age of Onset  . Other Father     "clogged arteries"  . Hypertension Mother     History   Social History  . Marital Status: Widowed  Spouse Name: N/A    Number of Children: 2  . Years of Education: N/A   Occupational History  . Retired    Social History Main Topics  . Smoking status: Former Smoker    Quit date: 02/28/1970  . Smokeless tobacco: Never Used  . Alcohol Use: Yes     1-2 glasses wine/daily  . Drug Use: No  . Sexually Active: Not on file   Other Topics Concern  . 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 No distress.  Neck: Normal range of motion. Neck supple. No thyromegaly present.  Cardiovascular: Normal rate, regular rhythm and normal heart sounds.  Exam reveals no gallop.   No murmur heard. Pulmonary/Chest: Effort normal and breath sounds normal. No respiratory distress.  He has no wheezes. He has no rales.  Abdominal: Soft. There is no tenderness.  Musculoskeletal: He exhibits no edema and no tenderness.  Lymphadenopathy:    He has no cervical adenopathy.  Psychiatric: He has a normal mood and affect. His behavior is normal. Thought content normal.          Assessment & Plan:

## 2011-09-30 NOTE — Telephone Encounter (Signed)
Medication sent to pharmacy  

## 2011-10-03 ENCOUNTER — Telehealth: Payer: Self-pay | Admitting: *Deleted

## 2011-10-03 DIAGNOSIS — M79605 Pain in left leg: Secondary | ICD-10-CM

## 2011-10-03 NOTE — Telephone Encounter (Signed)
done

## 2011-10-03 NOTE — Telephone Encounter (Signed)
Patient calling and requesting a referral for PT for his left leg already has already has a referral for the right .  Please fax referral to : [phone]   Can you please do this since dr.letvak is out of office

## 2011-10-04 DIAGNOSIS — M79609 Pain in unspecified limb: Secondary | ICD-10-CM | POA: Diagnosis not present

## 2011-10-06 DIAGNOSIS — M2560 Stiffness of unspecified joint, not elsewhere classified: Secondary | ICD-10-CM | POA: Diagnosis not present

## 2011-10-06 DIAGNOSIS — M79609 Pain in unspecified limb: Secondary | ICD-10-CM | POA: Diagnosis not present

## 2011-10-06 DIAGNOSIS — M25559 Pain in unspecified hip: Secondary | ICD-10-CM | POA: Diagnosis not present

## 2011-10-07 DIAGNOSIS — H2510 Age-related nuclear cataract, unspecified eye: Secondary | ICD-10-CM | POA: Diagnosis not present

## 2011-10-07 DIAGNOSIS — Z961 Presence of intraocular lens: Secondary | ICD-10-CM | POA: Diagnosis not present

## 2011-10-13 DIAGNOSIS — Z85828 Personal history of other malignant neoplasm of skin: Secondary | ICD-10-CM | POA: Diagnosis not present

## 2011-10-13 DIAGNOSIS — D234 Other benign neoplasm of skin of scalp and neck: Secondary | ICD-10-CM | POA: Diagnosis not present

## 2011-10-13 DIAGNOSIS — D485 Neoplasm of uncertain behavior of skin: Secondary | ICD-10-CM | POA: Diagnosis not present

## 2011-10-13 DIAGNOSIS — L570 Actinic keratosis: Secondary | ICD-10-CM | POA: Diagnosis not present

## 2011-10-21 DIAGNOSIS — H1130 Conjunctival hemorrhage, unspecified eye: Secondary | ICD-10-CM | POA: Diagnosis not present

## 2011-10-21 DIAGNOSIS — H318 Other specified disorders of choroid: Secondary | ICD-10-CM | POA: Diagnosis not present

## 2011-10-27 ENCOUNTER — Other Ambulatory Visit: Payer: Self-pay | Admitting: *Deleted

## 2011-10-27 MED ORDER — CLONAZEPAM 0.5 MG PO TABS: ORAL_TABLET | ORAL | Status: DC

## 2011-10-27 NOTE — Telephone Encounter (Signed)
Okay #180 x 0 

## 2011-10-27 NOTE — Telephone Encounter (Signed)
rx called into pharmacy

## 2011-10-27 NOTE — Telephone Encounter (Signed)
Last filled 07/26/2011

## 2011-10-28 DIAGNOSIS — D234 Other benign neoplasm of skin of scalp and neck: Secondary | ICD-10-CM | POA: Diagnosis not present

## 2011-10-28 DIAGNOSIS — C4359 Malignant melanoma of other part of trunk: Secondary | ICD-10-CM | POA: Diagnosis not present

## 2011-11-07 ENCOUNTER — Telehealth: Payer: Self-pay | Admitting: Internal Medicine

## 2011-11-07 DIAGNOSIS — IMO0002 Reserved for concepts with insufficient information to code with codable children: Secondary | ICD-10-CM

## 2011-11-07 NOTE — Telephone Encounter (Signed)
Caller: Stephen Garrett/Patient; Phone: [phone]; Reason for Call: Patient request to speak with Dr.  Silvio Pate about pain he has in his ankle that moves to his knee.  States it's not all the time and thinks he might have a sciatica nerve.  Patient wants to know if Dr.  Silvio Pate would write a Rx for him to have physical therapy, states he would like to have Angelia Mould again for physical therapy.  Please call him back.  Thanks

## 2011-11-08 NOTE — Telephone Encounter (Signed)
Patient returned Dr.Letvak's call. Patient said he has had 8 physical therapy visits for his legs.  Patient said he can have a total 19 vistis.  Patient said he would probably need 2 visits.  Patient has his physical therapy done at [facility].  Patient is asking for a rx for the physical therapy or if you need to see him,he'll come in for an office visit.

## 2011-11-08 NOTE — Telephone Encounter (Signed)
PT Appt made with Angelia Mould  And patient notified.

## 2011-11-14 DIAGNOSIS — M79609 Pain in unspecified limb: Secondary | ICD-10-CM | POA: Diagnosis not present

## 2011-12-23 DIAGNOSIS — L570 Actinic keratosis: Secondary | ICD-10-CM | POA: Diagnosis not present

## 2011-12-27 DIAGNOSIS — Z23 Encounter for immunization: Secondary | ICD-10-CM | POA: Diagnosis not present

## 2012-01-09 ENCOUNTER — Other Ambulatory Visit: Payer: Self-pay | Admitting: *Deleted

## 2012-01-09 MED ORDER — DOXAZOSIN MESYLATE 4 MG PO TABS: 6.0000 mg | ORAL_TABLET | Freq: Every day | ORAL | Status: DC

## 2012-01-16 ENCOUNTER — Other Ambulatory Visit: Payer: Self-pay | Admitting: *Deleted

## 2012-01-16 NOTE — Telephone Encounter (Signed)
Faxed refill request.  Last filled 10/27/11 for #180.  Please advise.

## 2012-01-17 MED ORDER — CLONAZEPAM 0.5 MG PO TABS: ORAL_TABLET | ORAL | Status: DC

## 2012-01-17 NOTE — Telephone Encounter (Signed)
Okay #180 x 0 Let him know he is a little early this time---so he shouldn't be early on the next refill

## 2012-01-17 NOTE — Telephone Encounter (Signed)
rx called into pharmacy

## 2012-01-24 DIAGNOSIS — L570 Actinic keratosis: Secondary | ICD-10-CM | POA: Diagnosis not present

## 2012-02-06 ENCOUNTER — Other Ambulatory Visit: Payer: Self-pay | Admitting: Internal Medicine

## 2012-02-06 ENCOUNTER — Telehealth: Payer: Self-pay

## 2012-02-06 DIAGNOSIS — E785 Hyperlipidemia, unspecified: Secondary | ICD-10-CM

## 2012-02-06 NOTE — Telephone Encounter (Signed)
Pt left v/m; has been off Simvastatin for 4-5 months; pt has f/u appt 03/2012 with Dr Silvio Pate but pt does not want to wait to have cholesterol checked.Please advise.

## 2012-02-06 NOTE — Telephone Encounter (Signed)
He is pretty much due for full labs so can get the whole thing now  Lipid, hepatic, met B, TSH, CBC with diff---- 272.4

## 2012-02-06 NOTE — Telephone Encounter (Signed)
lmom for the patient to call and schedule a lab appt. I entered future labs

## 2012-02-07 ENCOUNTER — Other Ambulatory Visit (INDEPENDENT_AMBULATORY_CARE_PROVIDER_SITE_OTHER): Payer: Medicare Other

## 2012-02-07 DIAGNOSIS — E785 Hyperlipidemia, unspecified: Secondary | ICD-10-CM | POA: Diagnosis not present

## 2012-02-07 LAB — LDL CHOLESTEROL, DIRECT: Direct LDL: 166.2 mg/dL

## 2012-02-07 LAB — LIPID PANEL
Cholesterol: 219 mg/dL — ABNORMAL HIGH (ref 0–200)
HDL: 32.8 mg/dL — ABNORMAL LOW
Total CHOL/HDL Ratio: 7
Triglycerides: 149 mg/dL (ref 0.0–149.0)
VLDL: 29.8 mg/dL (ref 0.0–40.0)

## 2012-02-07 LAB — CBC WITH DIFFERENTIAL/PLATELET
Basophils Absolute: 0 K/uL (ref 0.0–0.1)
Basophils Relative: 0.3 % (ref 0.0–3.0)
Eosinophils Absolute: 0.1 K/uL (ref 0.0–0.7)
Eosinophils Relative: 1.9 % (ref 0.0–5.0)
HCT: 41.5 % (ref 39.0–52.0)
Hemoglobin: 13.7 g/dL (ref 13.0–17.0)
Lymphocytes Relative: 25 % (ref 12.0–46.0)
Lymphs Abs: 1.6 K/uL (ref 0.7–4.0)
MCHC: 33 g/dL (ref 30.0–36.0)
MCV: 93 fl (ref 78.0–100.0)
Monocytes Absolute: 0.3 K/uL (ref 0.1–1.0)
Monocytes Relative: 5.3 % (ref 3.0–12.0)
Neutro Abs: 4.2 K/uL (ref 1.4–7.7)
Neutrophils Relative %: 67.5 % (ref 43.0–77.0)
Platelets: 196 K/uL (ref 150.0–400.0)
RBC: 4.47 Mil/uL (ref 4.22–5.81)
RDW: 14.2 % (ref 11.5–14.6)
WBC: 6.3 K/uL (ref 4.5–10.5)

## 2012-02-07 LAB — BASIC METABOLIC PANEL
BUN: 27 mg/dL — ABNORMAL HIGH (ref 6–23)
CO2: 32 mEq/L (ref 19–32)
Calcium: 8.9 mg/dL (ref 8.4–10.5)
Chloride: 102 mEq/L (ref 96–112)
Creatinine, Ser: 1.8 mg/dL — ABNORMAL HIGH (ref 0.4–1.5)
GFR: 38.92 mL/min — ABNORMAL LOW (ref >60.00)
Glucose, Bld: 100 mg/dL — ABNORMAL HIGH (ref 70–99)
Potassium: 3.6 mEq/L (ref 3.5–5.1)
Sodium: 138 mEq/L (ref 135–145)

## 2012-02-07 LAB — HEPATIC FUNCTION PANEL
ALT: 17 U/L (ref 0–53)
AST: 24 U/L (ref 0–37)
Albumin: 3.6 g/dL (ref 3.5–5.2)
Alkaline Phosphatase: 62 U/L (ref 39–117)
Bilirubin, Direct: 0 mg/dL (ref 0.0–0.3)
Total Bilirubin: 0.8 mg/dL (ref 0.3–1.2)
Total Protein: 6.2 g/dL (ref 6.0–8.3)

## 2012-02-07 LAB — TSH: TSH: 1.94 u[IU]/mL (ref 0.35–5.50)

## 2012-02-08 ENCOUNTER — Encounter: Payer: Self-pay | Admitting: *Deleted

## 2012-02-15 ENCOUNTER — Other Ambulatory Visit: Payer: Self-pay | Admitting: *Deleted

## 2012-02-15 MED ORDER — OMEPRAZOLE 20 MG PO CPDR: 20.0000 mg | DELAYED_RELEASE_CAPSULE | Freq: Every day | ORAL | Status: DC

## 2012-03-07 DIAGNOSIS — M6530 Trigger finger, unspecified finger: Secondary | ICD-10-CM | POA: Diagnosis not present

## 2012-03-23 ENCOUNTER — Encounter: Payer: Self-pay | Admitting: Internal Medicine

## 2012-03-23 ENCOUNTER — Ambulatory Visit (INDEPENDENT_AMBULATORY_CARE_PROVIDER_SITE_OTHER): Payer: Medicare Other | Admitting: Internal Medicine

## 2012-03-23 VITALS — BP 166/100 | HR 75 | Wt 185.0 lb

## 2012-03-23 DIAGNOSIS — I10 Essential (primary) hypertension: Secondary | ICD-10-CM

## 2012-03-23 DIAGNOSIS — R413 Other amnesia: Secondary | ICD-10-CM

## 2012-03-23 NOTE — Assessment & Plan Note (Signed)
BP Readings from Last 3 Encounters:  03/23/12 166/100  09/30/11 138/82  07/26/11 158/90   Has had elevations in the past with statins No change for now Stop the simvastatin

## 2012-03-23 NOTE — Assessment & Plan Note (Signed)
Mild cognitive and functional changes Sudden since last month Discussed the statin---will have him stop  Will reevaluate next month Do formal testing (did fine on the screen though) Consider MRI or CT (at least to look for NPH since he notes some balance issues---though probably from neuropathy and decreased foot sensation) If he is not better--will bring bridge partner to next visit

## 2012-03-23 NOTE — Progress Notes (Signed)
Subjective:    Patient ID: Stephen Garrett, male    DOB: 10-14-1923, 79 y.o.   MRN: [id number]  HPI Concerned that he is developing some dementia Since last month's visit--he feels "different" Bridge partners and family have noticed Walks into room and forgets why he went in there Forgetting if he took his meds Misplaying bridge games---looses track of the cards played, etc  More unsteady on his feet  Cholesterol was high So he restarted 1/2 simvastatin twice a week The symptoms did start around that time  Mood is okay on the medication No sig depression Worried about his memory  No chest pain No SOB Stamina is stable on the tennis court  Current Outpatient Prescriptions on File Prior to Visit  Medication Sig Dispense Refill  . aspirin 81 MG tablet Take 81 mg by mouth daily.      Marland Kitchen buPROPion (WELLBUTRIN XL) 150 MG 24 hr tablet Take 1 tablet (150 mg total) by mouth daily.  90 tablet  3  . clonazePAM (KLONOPIN) 0.5 MG tablet Take 1-2 tablets by mouth at bedtime as needed  180 tablet  0  . doxazosin (CARDURA) 4 MG tablet Take 1.5 tablets (6 mg total) by mouth at bedtime.  135 tablet  1  . finasteride (PROSCAR) 5 MG tablet Take 1 tablet (5 mg total) by mouth daily.  90 tablet  3  . fish oil-omega-3 fatty acids 1000 MG capsule Take 1 g by mouth 3 (three) times daily.        Marland Kitchen losartan-hydrochlorothiazide (HYZAAR) 100-25 MG per tablet Take 0.5 tablets by mouth daily.      . Multiple Vitamin (MULTIVITAMIN) capsule Take 1 capsule by mouth daily.        Marland Kitchen omeprazole (PRILOSEC) 20 MG capsule Take 1 capsule (20 mg total) by mouth daily.  90 capsule  1  . pramipexole (MIRAPEX) 1 MG tablet Take 1-2 tablets (1-2 mg total) by mouth at bedtime. For restless legs  180 tablet  3  . simvastatin (ZOCOR) 40 MG tablet Take 1/2 tablet by mouth twice a week      . [DISCONTINUED] citalopram (CELEXA) 10 MG tablet Take 1 tablet (10 mg total) by mouth daily.  30 tablet  5    Allergies  Allergen Reactions   . Atorvastatin     REACTION: raises blood pressure  . Pravastatin Sodium     REACTION: raises blood pressure  . Statins     REACTION: raises bp    Past Medical History  Diagnosis Date  . Diverticulosis of colon 06/2003  . GERD (gastroesophageal reflux disease)   . HLD (hyperlipidemia)   . HTN (hypertension)   . BPH (benign prostatic hypertrophy)   . Osteoarthritis of knee     severe, bilat  . Renal insufficiency   . Actinic keratosis   . Diseases of lips   . Psychosexual dysfunction with inhibited sexual excitement   . Restless legs syndrome (RLS)   . Sleep disturbance, unspecified   . Spinal stenosis, lumbar region, without neurogenic claudication     Past Surgical History  Procedure Date  . Finger surgery     Right 4th finger  . Tonsillectomy childhood  . Lumbar spine surgery 06/2005    stenosis repair  . Total knee arthroplasty 06/2008    bilat (Dr. Marry Guan)  . Cataract extraction, bilateral 2011    Dr. Thomasene Ripple  . Lih 10/2009    Dr. Bary Castilla    Family History  Problem Relation Age of Onset  .  Other Father     "clogged arteries"  . Hypertension Mother     History   Social History  . Marital Status: Widowed    Spouse Name: N/A    Number of Children: 2  . Years of Education: N/A   Occupational History  . Retired    Social History Main Topics  . Smoking status: Former Smoker    Quit date: 02/28/1970  . Smokeless tobacco: Never Used  . Alcohol Use: Yes     Comment: 1-2 glasses wine/daily  . Drug Use: No  . Sexually Active: Not on file   Other Topics Concern  . 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 He appears well-developed and well-nourished. No distress.  Neck: Normal range of  motion. Neck supple. No thyromegaly present.  Cardiovascular: Normal rate, regular rhythm and normal heart sounds.  Exam reveals no gallop.   No murmur heard. Pulmonary/Chest: Effort normal and breath sounds normal. No respiratory distress. He has no wheezes. He has no rales.  Musculoskeletal: He exhibits no edema.  Lymphadenopathy:    He has no cervical adenopathy.  Neurological: He is alert and oriented to person, place, and time. He has normal strength. He displays no tremor. He exhibits normal muscle tone. He displays a negative Romberg sign. Coordination and gait normal.       President-- "Obama, Bush, Bush (then [place] with prompt)" 100-93-86-79-72-65 D-l-r-o-w Recall 2/3  Can tell time on watch No incoordination but some trouble getting up on table  Psychiatric: He has a normal mood and affect. His behavior is normal.          Assessment & Plan:

## 2012-03-28 DIAGNOSIS — L570 Actinic keratosis: Secondary | ICD-10-CM | POA: Diagnosis not present

## 2012-03-28 DIAGNOSIS — D2360 Other benign neoplasm of skin of unspecified upper limb, including shoulder: Secondary | ICD-10-CM | POA: Diagnosis not present

## 2012-03-28 DIAGNOSIS — L821 Other seborrheic keratosis: Secondary | ICD-10-CM | POA: Diagnosis not present

## 2012-03-28 DIAGNOSIS — Z85820 Personal history of malignant melanoma of skin: Secondary | ICD-10-CM | POA: Diagnosis not present

## 2012-04-03 ENCOUNTER — Ambulatory Visit: Payer: Medicare Other | Admitting: Internal Medicine

## 2012-04-06 DIAGNOSIS — M48061 Spinal stenosis, lumbar region without neurogenic claudication: Secondary | ICD-10-CM | POA: Diagnosis not present

## 2012-04-06 DIAGNOSIS — M5137 Other intervertebral disc degeneration, lumbosacral region: Secondary | ICD-10-CM | POA: Diagnosis not present

## 2012-04-10 ENCOUNTER — Ambulatory Visit: Payer: Medicare Other | Admitting: Internal Medicine

## 2012-04-17 ENCOUNTER — Other Ambulatory Visit: Payer: Self-pay | Admitting: *Deleted

## 2012-04-17 NOTE — Telephone Encounter (Signed)
Last filled 01/23/12

## 2012-04-18 MED ORDER — CLONAZEPAM 0.5 MG PO TABS: ORAL_TABLET | ORAL | Status: DC

## 2012-04-18 NOTE — Telephone Encounter (Signed)
rx called into pharmacy

## 2012-04-18 NOTE — Telephone Encounter (Signed)
Okay #180 x 0 

## 2012-04-24 ENCOUNTER — Ambulatory Visit (INDEPENDENT_AMBULATORY_CARE_PROVIDER_SITE_OTHER): Payer: Medicare Other | Admitting: Internal Medicine

## 2012-04-24 ENCOUNTER — Encounter: Payer: Self-pay | Admitting: Internal Medicine

## 2012-04-24 VITALS — BP 160/90 | HR 63 | Temp 97.5°F | Wt 184.0 lb

## 2012-04-24 DIAGNOSIS — M48061 Spinal stenosis, lumbar region without neurogenic claudication: Secondary | ICD-10-CM

## 2012-04-24 DIAGNOSIS — R413 Other amnesia: Secondary | ICD-10-CM

## 2012-04-24 NOTE — Assessment & Plan Note (Signed)
Stiff in the morning Discussed night exercises, tylenol Still able to play tennis

## 2012-04-24 NOTE — Assessment & Plan Note (Signed)
Clearly related to statin Urged him never to take again

## 2012-04-24 NOTE — Progress Notes (Signed)
Subjective:    Patient ID: Stephen Garrett, male    DOB: Oct 07, 1923, 79 y.o.   MRN: [id number]  HPI Feels better Cognitive function is back to normal----by about 5 days after stopping the statin  BP at home usually 130-135/75-80  Asks about taking "Schiff Move Free Ultra" Joint formula with cartilage blend and hyaluronic acid Discussed okay to do trial  Also notes back pain in AM Has gone on for months Thinks his bed is fine Usually does loosen up after a while Did have past spinal stenosis  Some energy problems Wonders about taking extra B6 and B12 Discussed normal levels less than 2 years ago Can try but advised to wait to see if he feels better with the advent of spring  Current Outpatient Prescriptions on File Prior to Visit  Medication Sig Dispense Refill  . aspirin 81 MG tablet Take 81 mg by mouth daily.      Marland Kitchen buPROPion (WELLBUTRIN XL) 150 MG 24 hr tablet Take 1 tablet (150 mg total) by mouth daily.  90 tablet  3  . clonazePAM (KLONOPIN) 0.5 MG tablet Take 1-2 tablets by mouth at bedtime as needed  180 tablet  0  . doxazosin (CARDURA) 4 MG tablet Take 1.5 tablets (6 mg total) by mouth at bedtime.  135 tablet  1  . finasteride (PROSCAR) 5 MG tablet Take 1 tablet (5 mg total) by mouth daily.  90 tablet  3  . fish oil-omega-3 fatty acids 1000 MG capsule Take 1 g by mouth 3 (three) times daily.        Marland Kitchen losartan-hydrochlorothiazide (HYZAAR) 100-25 MG per tablet Take 1 tablet by mouth daily.       . Multiple Vitamin (MULTIVITAMIN) capsule Take 1 capsule by mouth daily.        Marland Kitchen omeprazole (PRILOSEC) 20 MG capsule Take 1 capsule (20 mg total) by mouth daily.  90 capsule  1  . pramipexole (MIRAPEX) 1 MG tablet Take 1-2 tablets (1-2 mg total) by mouth at bedtime. For restless legs  180 tablet  3  . [DISCONTINUED] citalopram (CELEXA) 10 MG tablet Take 1 tablet (10 mg total) by mouth daily.  30 tablet  5   No current facility-administered medications on file prior to visit.     Allergies  Allergen Reactions  . Atorvastatin     REACTION: raises blood pressure  . Pravastatin Sodium     REACTION: raises blood pressure  . Statins     REACTION: raises bp    Past Medical History  Diagnosis Date  . Diverticulosis of colon 06/2003  . GERD (gastroesophageal reflux disease)   . HLD (hyperlipidemia)   . HTN (hypertension)   . BPH (benign prostatic hypertrophy)   . Osteoarthritis of knee     severe, bilat  . Renal insufficiency   . Actinic keratosis   . Diseases of lips   . Psychosexual dysfunction with inhibited sexual excitement   . Restless legs syndrome (RLS)   . Sleep disturbance, unspecified   . Spinal stenosis, lumbar region, without neurogenic claudication     Past Surgical History  Procedure Laterality Date  . Finger surgery      Right 4th finger  . Tonsillectomy  childhood  . Lumbar spine surgery  06/2005    stenosis repair  . Total knee arthroplasty  06/2008    bilat (Dr. Marry Guan)  . Cataract extraction, bilateral  2011    Dr. Thomasene Ripple  . Lih  10/2009    Dr. Bary Castilla  Family History  Problem Relation Age of Onset  . Other Father     "clogged arteries"  . Hypertension Mother     History   Social History  . Marital Status: Widowed    Spouse Name: N/A    Number of Children: 2  . Years of Education: N/A   Occupational History  . Retired    Social History Main Topics  . Smoking status: Former Smoker    Quit date: 02/28/1970  . Smokeless tobacco: Never Used  . Alcohol Use: Yes     Comment: 1-2 glasses wine/daily  . Drug Use: No  . Sexually Active: Not on file   Other Topics Concern  . Not on file   Social History Narrative   Retired school principal      Widowed 2010      2 adopted children   Review of Systems Appetite is fine Weight is stable     Objective:   Physical Exam  Constitutional: He appears well-developed and well-nourished. No distress.  Musculoskeletal:  No spine tenderness Back flexion  limited to about 60 degrees  Neurological:  Gait slightly stiff but not ataxic          Assessment & Plan:

## 2012-06-25 ENCOUNTER — Telehealth: Payer: Self-pay

## 2012-06-25 ENCOUNTER — Other Ambulatory Visit: Payer: Self-pay | Admitting: *Deleted

## 2012-06-25 MED ORDER — BUPROPION HCL ER (XL) 150 MG PO TB24: 150.0000 mg | ORAL_TABLET | Freq: Every day | ORAL | Status: DC

## 2012-06-25 NOTE — Telephone Encounter (Signed)
Pt left v/m pt wants to know if Dr Silvio Pate thinks OK for pt to try Boost, energy drink to increase pt's energy level.Please advise.

## 2012-06-25 NOTE — Telephone Encounter (Signed)
That is fine---but it has calories. It is best if he skips a meal or has a light meal to boost his energy If he eats the same as usual and adds boost, he may put on unwanted weight

## 2012-06-26 NOTE — Telephone Encounter (Signed)
Left message on machine with results, advised pt to call if further questions

## 2012-07-10 ENCOUNTER — Other Ambulatory Visit: Payer: Self-pay | Admitting: *Deleted

## 2012-07-10 MED ORDER — DOXAZOSIN MESYLATE 4 MG PO TABS: 6.0000 mg | ORAL_TABLET | Freq: Every day | ORAL | Status: DC

## 2012-07-12 DIAGNOSIS — Z96659 Presence of unspecified artificial knee joint: Secondary | ICD-10-CM | POA: Diagnosis not present

## 2012-07-16 ENCOUNTER — Other Ambulatory Visit: Payer: Self-pay | Admitting: *Deleted

## 2012-07-17 MED ORDER — CLONAZEPAM 0.5 MG PO TABS: ORAL_TABLET | ORAL | Status: DC

## 2012-07-17 NOTE — Telephone Encounter (Signed)
rx called into pharmacy

## 2012-07-17 NOTE — Telephone Encounter (Signed)
Okay #180 x 0 

## 2012-07-27 DIAGNOSIS — Z85820 Personal history of malignant melanoma of skin: Secondary | ICD-10-CM | POA: Diagnosis not present

## 2012-07-27 DIAGNOSIS — D485 Neoplasm of uncertain behavior of skin: Secondary | ICD-10-CM | POA: Diagnosis not present

## 2012-07-27 DIAGNOSIS — C44319 Basal cell carcinoma of skin of other parts of face: Secondary | ICD-10-CM | POA: Diagnosis not present

## 2012-07-27 DIAGNOSIS — L570 Actinic keratosis: Secondary | ICD-10-CM | POA: Diagnosis not present

## 2012-08-13 ENCOUNTER — Other Ambulatory Visit: Payer: Self-pay | Admitting: *Deleted

## 2012-08-13 MED ORDER — OMEPRAZOLE 20 MG PO CPDR: 20.0000 mg | DELAYED_RELEASE_CAPSULE | Freq: Every day | ORAL | Status: DC

## 2012-08-13 NOTE — Telephone Encounter (Signed)
Received faxed refill request from pharmacy. See medication warning with Omeprazole and Citalopram.  Is it okay to refill medication?

## 2012-08-27 ENCOUNTER — Other Ambulatory Visit: Payer: Self-pay | Admitting: *Deleted

## 2012-08-27 MED ORDER — FINASTERIDE 5 MG PO TABS: 5.0000 mg | ORAL_TABLET | Freq: Every day | ORAL | Status: DC

## 2012-09-24 ENCOUNTER — Other Ambulatory Visit: Payer: Self-pay | Admitting: *Deleted

## 2012-09-24 MED ORDER — BUPROPION HCL ER (XL) 150 MG PO TB24: 150.0000 mg | ORAL_TABLET | Freq: Every day | ORAL | Status: DC

## 2012-10-01 ENCOUNTER — Telehealth: Payer: Self-pay | Admitting: *Deleted

## 2012-10-01 MED ORDER — LOSARTAN POTASSIUM-HCTZ 100-25 MG PO TABS: 1.0000 | ORAL_TABLET | Freq: Every day | ORAL | Status: DC

## 2012-10-01 NOTE — Telephone Encounter (Signed)
Faxed request asking for refill of VIAGRA 100 MG take 1/2 -1 tab as needed, last filled 10/16/2009, not on current or historical med list. Please advise, LETVAK PATIENT

## 2012-10-01 NOTE — Telephone Encounter (Signed)
Given his other meds and conditions, and the fact that it hasn't been filled in ~3 years, I'll defer to PCP.  I didn't sent the medicine.

## 2012-10-02 NOTE — Telephone Encounter (Signed)
Spoke with patient and advised that Dr.Letvak would need to approve, pt is ok with this.

## 2012-10-03 MED ORDER — SILDENAFIL CITRATE 100 MG PO TABS: 50.0000 mg | ORAL_TABLET | Freq: Every day | ORAL | Status: DC | PRN

## 2012-10-03 NOTE — Telephone Encounter (Signed)
rx sent to pharmacy by e-script Spoke with patient and advised results   

## 2012-10-03 NOTE — Telephone Encounter (Signed)
Okay to fill #6 x 2

## 2012-10-15 DIAGNOSIS — H33309 Unspecified retinal break, unspecified eye: Secondary | ICD-10-CM | POA: Diagnosis not present

## 2012-10-15 DIAGNOSIS — H524 Presbyopia: Secondary | ICD-10-CM | POA: Diagnosis not present

## 2012-10-15 DIAGNOSIS — Z961 Presence of intraocular lens: Secondary | ICD-10-CM | POA: Diagnosis not present

## 2012-10-16 ENCOUNTER — Other Ambulatory Visit: Payer: Self-pay

## 2012-10-16 NOTE — Telephone Encounter (Signed)
Pt left v/m requesting refill clonazepam and Mirapex to [facility].Please advise. Pt does not need call back.

## 2012-10-17 MED ORDER — CLONAZEPAM 0.5 MG PO TABS: ORAL_TABLET | ORAL | Status: DC

## 2012-10-17 MED ORDER — PRAMIPEXOLE DIHYDROCHLORIDE 1 MG PO TABS: 1.0000 mg | ORAL_TABLET | Freq: Every day | ORAL | Status: DC

## 2012-10-17 NOTE — Telephone Encounter (Signed)
rx called into pharmacy

## 2012-10-17 NOTE — Telephone Encounter (Signed)
Okay clonazepam #180 x 0  Pramipexole for a year

## 2012-10-31 ENCOUNTER — Encounter: Payer: Self-pay | Admitting: Radiology

## 2012-10-31 DIAGNOSIS — Z85820 Personal history of malignant melanoma of skin: Secondary | ICD-10-CM | POA: Diagnosis not present

## 2012-10-31 DIAGNOSIS — L570 Actinic keratosis: Secondary | ICD-10-CM | POA: Diagnosis not present

## 2012-11-01 ENCOUNTER — Encounter: Payer: Self-pay | Admitting: Family Medicine

## 2012-11-01 ENCOUNTER — Encounter: Payer: Self-pay | Admitting: Radiology

## 2012-11-01 ENCOUNTER — Ambulatory Visit (INDEPENDENT_AMBULATORY_CARE_PROVIDER_SITE_OTHER): Payer: Medicare Other | Admitting: Family Medicine

## 2012-11-01 VITALS — BP 140/72 | HR 66 | Temp 98.1°F | Ht 66.0 in | Wt 187.0 lb

## 2012-11-01 DIAGNOSIS — S46012A Strain of muscle(s) and tendon(s) of the rotator cuff of left shoulder, initial encounter: Secondary | ICD-10-CM

## 2012-11-01 DIAGNOSIS — S43429A Sprain of unspecified rotator cuff capsule, initial encounter: Secondary | ICD-10-CM

## 2012-11-01 NOTE — Progress Notes (Signed)
[place] [facility] [place] [address] Phone: [phone] Fax: [phone]  Date:  11/01/2012   Name:  Stephen Garrett   DOB:  [birth year]   MRN:  [id number] Gender: male Age: 79 y.o.  Primary Physician:  Viviana Simpler, MD  Evaluating MD: Owens Loffler, MD   Chief Complaint: Shoulder Pain   History of Present Illness:  Stephen Garrett is a 41 y.o. pleasant patient who presents with the following:  Playing a lot of tennis, if goes up over here, will feel it laterally. LHD.  3 times a week, mostly double.   Hit forehand and now with some mild pain with abd and irom. Old injury about 40 years ago  No dislocation. No old fx.  Approximate date of injury 11/01/2012. He was having some hands, and they felt pain in the shoulder. He does have full motion. It does ache a little bit with abduction internal range of motion.  Patient Active Problem List   Diagnosis Date Noted  . Memory loss 03/23/2012  . Dysthymia 04/22/2011  . ACTINIC KERATOSIS 11/18/2009  . RENAL INSUFFICIENCY 01/26/2009  . SLEEP DISORDER 10/03/2008  . OSTEOARTHRITIS 12/12/2006  . SPINAL STENOSIS, LUMBAR 06/24/2006  . HYPERLIPIDEMIA 06/21/2006  . ERECTILE DYSFUNCTION 06/21/2006  . RESTLESS LEG SYNDROME 06/21/2006  . HYPERTENSION 06/21/2006  . GERD 06/21/2006  . DIVERTICULOSIS, COLON 06/21/2006  . BENIGN PROSTATIC HYPERTROPHY 06/21/2006    Past Medical History  Diagnosis Date  . Diverticulosis of colon 06/2003  . GERD (gastroesophageal reflux disease)   . HLD (hyperlipidemia)   . HTN (hypertension)   . BPH (benign prostatic hypertrophy)   . Osteoarthritis of knee     severe, bilat  . Renal insufficiency   . Actinic keratosis   . Diseases of lips   . Psychosexual dysfunction with inhibited sexual excitement   . Restless legs syndrome (RLS)   . Sleep disturbance, unspecified   . Spinal stenosis, lumbar region, without neurogenic claudication     Past Surgical  History  Procedure Laterality Date  . Finger surgery      Right 4th finger  . Tonsillectomy  childhood  . Lumbar spine surgery  06/2005    stenosis repair  . Total knee arthroplasty  06/2008    bilat (Dr. Marry Guan)  . Cataract extraction, bilateral  2011    Dr. Thomasene Ripple  . Lih  10/2009    Dr. Bary Castilla    History   Social History  . Marital Status: Widowed    Spouse Name: N/A    Number of Children: 2  . Years of Education: N/A   Occupational History  . Retired    Social History Main Topics  . Smoking status: Former Smoker    Quit date: 02/28/1970  . Smokeless tobacco: Never Used  . Alcohol Use: Yes     Comment: 1-2 glasses wine/daily  . Drug Use: No  . Sexual Activity: Not on file   Other Topics Concern  . Not on file   Social History Narrative   Retired school principal      Widowed 2010      2 adopted children    Family History  Problem Relation Age of Onset  . Other Father     "clogged arteries"  . Hypertension Mother     Allergies  Allergen Reactions  . Atorvastatin     REACTION: raises blood pressure  . Pravastatin Sodium     REACTION: raises blood pressure  . Statins  REACTION: raises bp    Medication list has been reviewed and updated.  Outpatient Prescriptions Prior to Visit  Medication Sig Dispense Refill  . aspirin 81 MG tablet Take 81 mg by mouth daily.      Marland Kitchen buPROPion (WELLBUTRIN XL) 150 MG 24 hr tablet Take 1 tablet (150 mg total) by mouth daily.  90 tablet  3  . clonazePAM (KLONOPIN) 0.5 MG tablet Take 1-2 tablets by mouth at bedtime as needed  180 tablet  0  . doxazosin (CARDURA) 4 MG tablet Take 1.5 tablets (6 mg total) by mouth at bedtime.  135 tablet  1  . finasteride (PROSCAR) 5 MG tablet Take 1 tablet (5 mg total) by mouth daily.  90 tablet  1  . fish oil-omega-3 fatty acids 1000 MG capsule Take 1 g by mouth daily.       Marland Kitchen losartan-hydrochlorothiazide (HYZAAR) 100-25 MG per tablet Take 1 tablet by mouth daily.  90 tablet  3    . Multiple Vitamin (MULTIVITAMIN) capsule Take 1 capsule by mouth daily.        Marland Kitchen omeprazole (PRILOSEC) 20 MG capsule Take 1 capsule (20 mg total) by mouth daily.  90 capsule  1  . pramipexole (MIRAPEX) 1 MG tablet Take 1-2 tablets (1-2 mg total) by mouth at bedtime. For restless legs  180 tablet  3  . sildenafil (VIAGRA) 100 MG tablet Take 0.5-1 tablets (50-100 mg total) by mouth daily as needed for erectile dysfunction.  6 tablet  2   No facility-administered medications prior to visit.    Review of Systems:   GEN: No fevers, chills. Nontoxic. Primarily MSK c/o today. MSK: Detailed in the HPI GI: tolerating PO intake without difficulty Neuro: No numbness, parasthesias, or tingling associated. Otherwise the pertinent positives of the ROS are noted above.    Physical Examination: BP 140/72  Pulse 66  Temp(Src) 98.1 F (36.7 C) (Oral)  Ht 5\' 6"  (1.676 m)  Wt 187 lb (84.823 kg)  BMI 30.2 kg/m2  Ideal Body Weight: Weight in (lb) to have BMI = 25: 154.6   GEN: WDWN, NAD, Non-toxic, Alert & Oriented x 3 HEENT: Atraumatic, Normocephalic.  Ears and Nose: No external deformity. EXTR: No clubbing/cyanosis/edema NEURO: Normal gait.  PSYCH: Normally interactive. Conversant. Not depressed or anxious appearing.  Calm demeanor.   Shoulder: L Inspection: No muscle wasting or winging Ecchymosis/edema: neg  AC joint, scapula, clavicle: NT Cervical spine: NT, full ROM Spurling's: neg Abduction: full, 5/5 Flexion: full, 5/5 IR, full, lift-off: 5/5 ER at neutral: full, 5/5 AC crossover and compression: neg Neer: neg Hawkins: neg Drop Test: neg Empty Can: neg Supraspinatus insertion: NT Bicipital groove: NT Speed's: neg Yergason's: neg Sulcus sign: neg Scapular dyskinesis: none C5-T1 intact Sensation intact Grip 5/5   Assessment and Plan: Rotator cuff strain, left, initial encounter    Mild rotator cuff strain. Alter tennis regimen so he is hitting the ball lightly.  Recommended light brown to work first hitting no more than 50%. Back off more if pain.  Signed, Maud Deed. Andreya Lacks, MD 11/01/2012 10:12 AM

## 2012-11-02 ENCOUNTER — Encounter: Payer: Medicare Other | Admitting: Internal Medicine

## 2012-11-02 ENCOUNTER — Encounter: Payer: Self-pay | Admitting: Internal Medicine

## 2012-11-02 ENCOUNTER — Telehealth: Payer: Self-pay

## 2012-11-02 ENCOUNTER — Ambulatory Visit (INDEPENDENT_AMBULATORY_CARE_PROVIDER_SITE_OTHER): Payer: Medicare Other | Admitting: Internal Medicine

## 2012-11-02 VITALS — BP 156/90 | HR 60 | Temp 97.6°F | Ht 67.0 in | Wt 188.0 lb

## 2012-11-02 DIAGNOSIS — G2581 Restless legs syndrome: Secondary | ICD-10-CM

## 2012-11-02 DIAGNOSIS — E785 Hyperlipidemia, unspecified: Secondary | ICD-10-CM | POA: Diagnosis not present

## 2012-11-02 DIAGNOSIS — Z0000 Encounter for general adult medical examination without abnormal findings: Secondary | ICD-10-CM

## 2012-11-02 DIAGNOSIS — I10 Essential (primary) hypertension: Secondary | ICD-10-CM

## 2012-11-02 DIAGNOSIS — K219 Gastro-esophageal reflux disease without esophagitis: Secondary | ICD-10-CM

## 2012-11-02 DIAGNOSIS — N259 Disorder resulting from impaired renal tubular function, unspecified: Secondary | ICD-10-CM

## 2012-11-02 DIAGNOSIS — Z79899 Other long term (current) drug therapy: Secondary | ICD-10-CM | POA: Diagnosis not present

## 2012-11-02 LAB — LIPID PANEL
Cholesterol: 230 mg/dL — ABNORMAL HIGH (ref 0–200)
HDL: 40.9 mg/dL (ref >39.00)
Total CHOL/HDL Ratio: 6
Triglycerides: 118 mg/dL (ref 0.0–149.0)
VLDL: 23.6 mg/dL (ref 0.0–40.0)

## 2012-11-02 LAB — CBC WITH DIFFERENTIAL/PLATELET
Basophils Absolute: 0 10*3/uL (ref 0.0–0.1)
Basophils Relative: 0.3 % (ref 0.0–3.0)
Eosinophils Absolute: 0.1 10*3/uL (ref 0.0–0.7)
Eosinophils Relative: 1.1 % (ref 0.0–5.0)
HCT: 43 % (ref 39.0–52.0)
Hemoglobin: 14.5 g/dL (ref 13.0–17.0)
Lymphocytes Relative: 24.8 % (ref 12.0–46.0)
Lymphs Abs: 1.9 10*3/uL (ref 0.7–4.0)
MCHC: 33.8 g/dL (ref 30.0–36.0)
MCV: 91.2 fl (ref 78.0–100.0)
Monocytes Absolute: 0.5 10*3/uL (ref 0.1–1.0)
Monocytes Relative: 6.1 % (ref 3.0–12.0)
Neutro Abs: 5.3 10*3/uL (ref 1.4–7.7)
Neutrophils Relative %: 67.7 % (ref 43.0–77.0)
Platelets: 162 10*3/uL (ref 150.0–400.0)
RBC: 4.72 Mil/uL (ref 4.22–5.81)
RDW: 14.4 % (ref 11.5–14.6)
WBC: 7.8 10*3/uL (ref 4.5–10.5)

## 2012-11-02 LAB — BASIC METABOLIC PANEL
BUN: 24 mg/dL — ABNORMAL HIGH (ref 6–23)
CO2: 30 mEq/L (ref 19–32)
Calcium: 9.2 mg/dL (ref 8.4–10.5)
Chloride: 103 mEq/L (ref 96–112)
Creatinine, Ser: 1.4 mg/dL (ref 0.4–1.5)
GFR: 53.11 mL/min — ABNORMAL LOW (ref >60.00)
Glucose, Bld: 93 mg/dL (ref 70–99)
Potassium: 3.5 mEq/L (ref 3.5–5.1)
Sodium: 137 mEq/L (ref 135–145)

## 2012-11-02 LAB — TSH: TSH: 2.11 u[IU]/mL (ref 0.35–5.50)

## 2012-11-02 LAB — HEPATIC FUNCTION PANEL
ALT: 18 U/L (ref 0–53)
AST: 20 U/L (ref 0–37)
Albumin: 4 g/dL (ref 3.5–5.2)
Alkaline Phosphatase: 69 U/L (ref 39–117)
Bilirubin, Direct: 0 mg/dL (ref 0.0–0.3)
Total Bilirubin: 0.9 mg/dL (ref 0.3–1.2)
Total Protein: 6.7 g/dL (ref 6.0–8.3)

## 2012-11-02 LAB — LDL CHOLESTEROL, DIRECT: Direct LDL: 170.1 mg/dL

## 2012-11-02 NOTE — Assessment & Plan Note (Signed)
BP Readings from Last 3 Encounters:  11/02/12 156/90  11/01/12 140/72  04/24/12 160/90   Up a bit today Okay for age No changes

## 2012-11-02 NOTE — Assessment & Plan Note (Signed)
Controlled with the PPI 

## 2012-11-02 NOTE — Telephone Encounter (Signed)
Pt was seen earlier today and pt is trying to get into Mychart; pt does not have activation code; I was unable to generate a code for pt; spoke with Shantel at my chart [phone]. Shantel said pt needs to call [phone], appears pt is already activated. Pt will call as instructed.

## 2012-11-02 NOTE — Patient Instructions (Signed)
Please try cutting the clonazepam down to 1 at bedtime. If you do okay on that, you can even try to skip nights (don't take any and only save it for a bad night).

## 2012-11-02 NOTE — Assessment & Plan Note (Signed)
I have personally reviewed the Medicare Annual Wellness questionnaire and have noted 1. The patient's medical and social history 2. Their use of alcohol, tobacco or illicit drugs 3. Their current medications and supplements 4. The patient's functional ability including ADL's, fall risks, home safety risks and hearing or visual             impairment. 5. Diet and physical activities 6. Evidence for depression or mood disorders  The patients weight, height, BMI and visual acuity have been recorded in the chart I have made referrals, counseling and provided education to the patient based review of the above and I have provided the pt with a written personalized care plan for preventive services.  I have provided you with a copy of your personalized plan for preventive services. Please take the time to review along with your updated medication list.  UTD with imms Will get flu vaccine at [facility] fit

## 2012-11-02 NOTE — Progress Notes (Signed)
Subjective:    Patient ID: Stephen Garrett, male    DOB: May 09, 1923, 79 y.o.   MRN: [id number]  HPI Here for medicare wellness visit and follow up Reviewed other physicians Still exercises regularly--concerned about easier fatigue on tennis court (but still occasionally plays singles) No depression or anhedonia No falls Independent with ADLs Still with mild memory issues---but no acute issues and stable (still better off the statin)  Still on dual therapy for prostate Stable nocturia x 2-3 No daytime urgency or frequency  No heartburn on omeprazole No swallowing problems  No chest pain No SOB No sig edema No dizziness or syncope Again notes change in exercise tolerance on tennis court--may have progressed over the past year  Sleep is still a problem Uses pramipexole and clonazepam---these control the restless legs Does awaken several times at night--not clearly to void. Then gets back to sleep Does have AM tiredness at times--wears off over time  Current Outpatient Prescriptions on File Prior to Visit  Medication Sig Dispense Refill  . aspirin 81 MG tablet Take 81 mg by mouth every other day.       Marland Kitchen buPROPion (WELLBUTRIN XL) 150 MG 24 hr tablet Take 1 tablet (150 mg total) by mouth daily.  90 tablet  3  . clonazePAM (KLONOPIN) 0.5 MG tablet Take 1-2 tablets by mouth at bedtime as needed  180 tablet  0  . doxazosin (CARDURA) 4 MG tablet Take 1.5 tablets (6 mg total) by mouth at bedtime.  135 tablet  1  . finasteride (PROSCAR) 5 MG tablet Take 1 tablet (5 mg total) by mouth daily.  90 tablet  1  . fish oil-omega-3 fatty acids 1000 MG capsule Take 1 g by mouth daily.       Marland Kitchen losartan-hydrochlorothiazide (HYZAAR) 100-25 MG per tablet Take 1 tablet by mouth daily.  90 tablet  3  . Multiple Vitamin (MULTIVITAMIN) capsule Take 1 capsule by mouth daily.        Marland Kitchen omeprazole (PRILOSEC) 20 MG capsule Take 1 capsule (20 mg total) by mouth daily.  90 capsule  1  . pramipexole (MIRAPEX) 1  MG tablet Take 1-2 tablets (1-2 mg total) by mouth at bedtime. For restless legs  180 tablet  3  . sildenafil (VIAGRA) 100 MG tablet Take 0.5-1 tablets (50-100 mg total) by mouth daily as needed for erectile dysfunction.  6 tablet  2  . [DISCONTINUED] citalopram (CELEXA) 10 MG tablet Take 1 tablet (10 mg total) by mouth daily.  30 tablet  5   No current facility-administered medications on file prior to visit.    Allergies  Allergen Reactions  . Atorvastatin     REACTION: raises blood pressure  . Pravastatin Sodium     REACTION: raises blood pressure  . Statins     REACTION: raises bp    Past Medical History  Diagnosis Date  . Diverticulosis of colon 06/2003  . GERD (gastroesophageal reflux disease)   . HLD (hyperlipidemia)   . HTN (hypertension)   . BPH (benign prostatic hypertrophy)   . Osteoarthritis of knee     severe, bilat  . Renal insufficiency   . Actinic keratosis   . Diseases of lips   . ED (erectile dysfunction)   . Restless legs syndrome (RLS)   . Sleep disturbance, unspecified   . Spinal stenosis, lumbar region, without neurogenic claudication     Past Surgical History  Procedure Laterality Date  . Finger surgery      Right 4th  finger  . Tonsillectomy  childhood  . Lumbar spine surgery  06/2005    stenosis repair  . Total knee arthroplasty  06/2008    bilat (Dr. Marry Guan)  . Cataract extraction, bilateral  2011    Dr. Thomasene Ripple  . Lih  10/2009    Dr. Bary Castilla    Family History  Problem Relation Age of Onset  . Other Father     "clogged arteries"  . Hypertension Mother     History   Social History  . Marital Status: Widowed    Spouse Name: N/A    Number of Children: 2  . Years of Education: N/A   Occupational History  . Retired    Social History Main Topics  . Smoking status: Former Smoker    Quit date: 02/28/1970  . Smokeless tobacco: Never Used  . Alcohol Use: Yes     Comment: 1-2 glasses wine/daily  . Drug Use: No  . Sexual Activity:  Not on file   Other Topics Concern  . Not on file   Social History Narrative   Widowed 2010   2 adopted children   Has living will   Son is health care power of attorney   Would accept resuscitation but no prolonged ventilation   No tube feeds if cognitively unaware   Review of Systems Notes easy bruising---will decrease the aspirin to every other day Still in monogamous relationship---uses the viagra at times    Objective:   Physical Exam  Constitutional: He is oriented to person, place, and time. He appears well-developed and well-nourished. No distress.  HENT:  Mouth/Throat: Oropharynx is clear and moist. No oropharyngeal exudate.  Eyes: Conjunctivae and EOM are normal. Pupils are equal, round, and reactive to light.  Neck: Normal range of motion. Neck supple. No thyromegaly present.  Cardiovascular: Normal rate, regular rhythm, normal heart sounds and intact distal pulses.  Exam reveals no gallop.   No murmur heard. Pulmonary/Chest: Effort normal and breath sounds normal. No respiratory distress. He has no wheezes. He has no rales.  Abdominal: Soft. There is no tenderness.  Musculoskeletal: He exhibits edema. He exhibits no tenderness.  Trace ankle edema  Lymphadenopathy:    He has no cervical adenopathy.  Neurological: He is alert and oriented to person, place, and time.  President-- "Obama, [place], then [place] (with prompt)" 100-93-86-79-72-65 D-l-r-o-w Recall 2/3  Skin: No rash noted. No erythema.  Psychiatric: He has a normal mood and affect. His behavior is normal.          Assessment & Plan:

## 2012-11-02 NOTE — Assessment & Plan Note (Signed)
Had cognitive changes on statin Will not try another given his age and lack of defined vascular disease

## 2012-11-02 NOTE — Assessment & Plan Note (Signed)
Lab Results  Component Value Date   CREATININE 1.8* 02/07/2012   On ACEI Discussed no NSAIDS

## 2012-11-02 NOTE — Assessment & Plan Note (Signed)
Controlled with meds I asked him to try to cut back on the clonazepam

## 2012-11-04 ENCOUNTER — Encounter: Payer: Self-pay | Admitting: Internal Medicine

## 2012-11-06 ENCOUNTER — Encounter: Payer: Self-pay | Admitting: Family Medicine

## 2012-11-06 ENCOUNTER — Telehealth: Payer: Self-pay

## 2012-11-06 DIAGNOSIS — S46019A Strain of muscle(s) and tendon(s) of the rotator cuff of unspecified shoulder, initial encounter: Secondary | ICD-10-CM

## 2012-11-06 NOTE — Telephone Encounter (Signed)
Stephen Garrett with [facility] left v/m; pt called [facility] to set up appt for PT. Stephen Garrett request cb or fax order for PT. See 11/06/12 pt email note from Dr Lorelei Pont.

## 2012-11-06 NOTE — Telephone Encounter (Signed)
Please help.

## 2012-11-06 NOTE — Telephone Encounter (Signed)
Dr. Salvatore Marvel enter order for PT.

## 2012-11-06 NOTE — Telephone Encounter (Signed)
i already did this earlier. Rosaria Ferries can you help with referral. Angelia Mould as per my order.

## 2012-11-09 ENCOUNTER — Encounter: Payer: Self-pay | Admitting: Internal Medicine

## 2012-11-09 DIAGNOSIS — M25519 Pain in unspecified shoulder: Secondary | ICD-10-CM | POA: Diagnosis not present

## 2012-11-09 DIAGNOSIS — M67919 Unspecified disorder of synovium and tendon, unspecified shoulder: Secondary | ICD-10-CM | POA: Diagnosis not present

## 2012-11-13 DIAGNOSIS — M25519 Pain in unspecified shoulder: Secondary | ICD-10-CM | POA: Diagnosis not present

## 2012-11-13 DIAGNOSIS — M25559 Pain in unspecified hip: Secondary | ICD-10-CM | POA: Diagnosis not present

## 2012-11-13 DIAGNOSIS — M2560 Stiffness of unspecified joint, not elsewhere classified: Secondary | ICD-10-CM | POA: Diagnosis not present

## 2012-11-13 DIAGNOSIS — M67919 Unspecified disorder of synovium and tendon, unspecified shoulder: Secondary | ICD-10-CM | POA: Diagnosis not present

## 2012-11-13 DIAGNOSIS — M79609 Pain in unspecified limb: Secondary | ICD-10-CM | POA: Diagnosis not present

## 2012-11-15 ENCOUNTER — Encounter: Payer: Self-pay | Admitting: Internal Medicine

## 2012-11-15 DIAGNOSIS — M79609 Pain in unspecified limb: Secondary | ICD-10-CM | POA: Diagnosis not present

## 2012-11-15 DIAGNOSIS — M25519 Pain in unspecified shoulder: Secondary | ICD-10-CM | POA: Diagnosis not present

## 2012-11-15 DIAGNOSIS — M67919 Unspecified disorder of synovium and tendon, unspecified shoulder: Secondary | ICD-10-CM | POA: Diagnosis not present

## 2012-11-15 DIAGNOSIS — M25559 Pain in unspecified hip: Secondary | ICD-10-CM | POA: Diagnosis not present

## 2012-11-15 DIAGNOSIS — M2560 Stiffness of unspecified joint, not elsewhere classified: Secondary | ICD-10-CM | POA: Diagnosis not present

## 2012-11-16 ENCOUNTER — Encounter: Payer: Self-pay | Admitting: Internal Medicine

## 2012-11-19 DIAGNOSIS — M67919 Unspecified disorder of synovium and tendon, unspecified shoulder: Secondary | ICD-10-CM | POA: Diagnosis not present

## 2012-11-19 DIAGNOSIS — M79609 Pain in unspecified limb: Secondary | ICD-10-CM | POA: Diagnosis not present

## 2012-11-19 DIAGNOSIS — M2560 Stiffness of unspecified joint, not elsewhere classified: Secondary | ICD-10-CM | POA: Diagnosis not present

## 2012-11-19 DIAGNOSIS — M25519 Pain in unspecified shoulder: Secondary | ICD-10-CM | POA: Diagnosis not present

## 2012-11-19 DIAGNOSIS — M25559 Pain in unspecified hip: Secondary | ICD-10-CM | POA: Diagnosis not present

## 2012-11-22 DIAGNOSIS — M67919 Unspecified disorder of synovium and tendon, unspecified shoulder: Secondary | ICD-10-CM | POA: Diagnosis not present

## 2012-11-22 DIAGNOSIS — M25519 Pain in unspecified shoulder: Secondary | ICD-10-CM | POA: Diagnosis not present

## 2012-11-29 DIAGNOSIS — M67919 Unspecified disorder of synovium and tendon, unspecified shoulder: Secondary | ICD-10-CM | POA: Diagnosis not present

## 2012-11-29 DIAGNOSIS — M25519 Pain in unspecified shoulder: Secondary | ICD-10-CM | POA: Diagnosis not present

## 2012-12-05 ENCOUNTER — Telehealth (INDEPENDENT_AMBULATORY_CARE_PROVIDER_SITE_OTHER): Payer: Medicare Other | Admitting: Family Medicine

## 2012-12-05 ENCOUNTER — Ambulatory Visit (INDEPENDENT_AMBULATORY_CARE_PROVIDER_SITE_OTHER)
Admission: RE | Admit: 2012-12-05 | Discharge: 2012-12-05 | Disposition: A | Discharge status: Home / Self Care | Payer: Medicare Other | Source: Ambulatory Visit | Attending: Family Medicine | Admitting: Family Medicine

## 2012-12-05 ENCOUNTER — Encounter: Payer: Self-pay | Admitting: Family Medicine

## 2012-12-05 VITALS — BP 150/80 | HR 68 | Temp 98.0°F | Ht 67.0 in | Wt 189.8 lb

## 2012-12-05 DIAGNOSIS — M7582 Other shoulder lesions, left shoulder: Secondary | ICD-10-CM

## 2012-12-05 DIAGNOSIS — M25519 Pain in unspecified shoulder: Secondary | ICD-10-CM | POA: Diagnosis not present

## 2012-12-05 DIAGNOSIS — M19012 Primary osteoarthritis, left shoulder: Secondary | ICD-10-CM

## 2012-12-05 DIAGNOSIS — M67919 Unspecified disorder of synovium and tendon, unspecified shoulder: Secondary | ICD-10-CM

## 2012-12-05 DIAGNOSIS — M19019 Primary osteoarthritis, unspecified shoulder: Secondary | ICD-10-CM | POA: Diagnosis not present

## 2012-12-05 DIAGNOSIS — M25512 Pain in left shoulder: Secondary | ICD-10-CM

## 2012-12-05 NOTE — Progress Notes (Signed)
Date:  12/05/2012   Name:  Stephen Garrett   DOB:  08/09/1923   MRN:  [id number] Gender: male Age: 79 y.o.  Primary Physician:  Viviana Simpler, MD   Chief Complaint: Shoulder Pain   History of Present Illness:  Stephen Garrett is a 61 y.o. very pleasant male patient who presents with the following:  Pleasant gentleman that I saw 1 mo ago with shoulder pain, LHD, who has been doing shoulder rehab with Angelia Mould. Generally, he does not feel better and his shoulder is still aching and limiting his tennis and causing him discomfort at night.  Past Medical History, Surgical History, Social History, Family History, Problem List, Medications, and Allergies have been reviewed and updated if relevant.  Current Outpatient Prescriptions on File Prior to Visit  Medication Sig Dispense Refill  . aspirin 81 MG tablet Take 81 mg by mouth every other day.       Marland Kitchen buPROPion (WELLBUTRIN XL) 150 MG 24 hr tablet Take 1 tablet (150 mg total) by mouth daily.  90 tablet  3  . clonazePAM (KLONOPIN) 0.5 MG tablet Take 1-2 tablets by mouth at bedtime as needed  180 tablet  0  . doxazosin (CARDURA) 4 MG tablet Take 1.5 tablets (6 mg total) by mouth at bedtime.  135 tablet  1  . finasteride (PROSCAR) 5 MG tablet Take 1 tablet (5 mg total) by mouth daily.  90 tablet  1  . fish oil-omega-3 fatty acids 1000 MG capsule Take 1 g by mouth daily.       Marland Kitchen losartan-hydrochlorothiazide (HYZAAR) 100-25 MG per tablet Take 1 tablet by mouth daily.  90 tablet  3  . Multiple Vitamin (MULTIVITAMIN) capsule Take 1 capsule by mouth daily.        Marland Kitchen omeprazole (PRILOSEC) 20 MG capsule Take 1 capsule (20 mg total) by mouth daily.  90 capsule  1  . pramipexole (MIRAPEX) 1 MG tablet Take 1-2 tablets (1-2 mg total) by mouth at bedtime. For restless legs  180 tablet  3  . sildenafil (VIAGRA) 100 MG tablet Take 0.5-1 tablets (50-100 mg total) by mouth daily as needed for erectile dysfunction.  6 tablet  2  . [DISCONTINUED] citalopram  (CELEXA) 10 MG tablet Take 1 tablet (10 mg total) by mouth daily.  30 tablet  5   No current facility-administered medications on file prior to visit.    Review of Systems:  GEN: No fevers, chills. Nontoxic. Primarily MSK c/o today. MSK: Detailed in the HPI GI: tolerating PO intake without difficulty Neuro: No numbness, parasthesias, or tingling associated. Otherwise the pertinent positives of the ROS are noted above.    Physical Examination: BP 150/80  Pulse 68  Temp(Src) 98 F (36.7 C) (Oral)  Ht 5\' 7"  (1.702 m)  Wt 189 lb 12 oz (86.07 kg)  BMI 29.71 kg/m2   GEN: Well-developed,well-nourished,in no acute distress; alert,appropriate and cooperative throughout examination HEENT: Normocephalic and atraumatic without obvious abnormalities. Ears, externally no deformities PULM: Breathing comfortably in no respiratory distress EXT: No clubbing, cyanosis, or edema PSYCH: Normally interactive. Cooperative during the interview. Pleasant. Friendly and conversant. Not anxious or depressed appearing. Normal, full affect.  Shoulder: L Inspection: No muscle wasting or winging Minimal crepitus Ecchymosis/edema: neg  AC joint, scapula, clavicle: NT Cervical spine: NT, full ROM Spurling's: neg Abduction: full, 5/5 Flexion: full, 5/5 IR, full, lift-off: 5/5 ER at neutral: full, 5/5 AC crossover: neg Neer: pos Hawkins: pos Drop Test: neg Empty Can: pos Supraspinatus insertion:  mild-mod T Bicipital groove: NT Speed's: neg Yergason's: neg Sulcus sign: neg Scapular dyskinesis: none C5-T1 intact  Neuro: Sensation intact Grip 5/5   Dg Shoulder Left  12/05/2012   *RADIOLOGY REPORT*  Clinical Data: Left shoulder pain, initial encounter.  LEFT SHOULDER - 2+ VIEW  Comparison: None.  Findings:  No fracture or dislocation.  Moderate degenerative change of the left glenohumeral joint with joint space loss, subchondral sclerosis and osteophytosis.  Suspect mild degenerative change of the  left AC joint with joint space loss and minimal inferiorly- directed osteophytosis.  Possible minimal amount of chondrocalcinosis.  Limited visualization of the adjacent thorax is normal.  Regional soft tissues are normal.  IMPRESSION: 1.  Moderate degenerative change of the left glenohumeral joint.  2.  Suspected mild degenerative change at the left [facility] joint. 3.  Possible calcific tendonitis.   Original Report Authenticated By: Jake Seats, MD    Assessment and Plan: Glenohumeral arthritis, left  Left shoulder pain - Plan: DG Shoulder Left  Rotator cuff tendonitis, left   I think that [place] OA is likely more at play than I originally thought. I am going to do a combo subac and GH intraarticular injection to try to calm it down.  Intrarticular Shoulder Injection, LEFT Verbal consent was obtained from the patient. Risks including infection explained and contrasted with benefits and alternatives. Patient prepped with Chloraprep and Ethyl Chloride used for anesthesia. An intraarticular shoulder injection was performed using the posterior approach. The patient tolerated the procedure well and had decreased pain post injection. No complications. Injection: 4 cc of Lidocaine 1% and 1 cc of Depo-Medrol 40 mg. Needle: 22 gauge  SubAC Injection, LEFT Verbal consent was obtained from the patient. Risks (including rare infection), benefits, and alternatives were explained. Patient prepped with Chloraprep and Ethyl Chloride used for anesthesia. The subacromial space was injected using the posterior approach. The patient tolerated the procedure well and had decreased pain post injection. No complications. Injection: 4 cc of Lidocaine 1% and 1 cc of Depo-Medrol 40 mg. Needle: 22 gauge   Signed,  Kimmie Doren T. Lindie Roberson, MD, [place] at [facility] [place] [address] Phone: [phone] Fax: [phone]

## 2012-12-06 DIAGNOSIS — M19019 Primary osteoarthritis, unspecified shoulder: Secondary | ICD-10-CM | POA: Insufficient documentation

## 2012-12-25 ENCOUNTER — Encounter: Payer: Medicare Other | Admitting: Internal Medicine

## 2012-12-27 DIAGNOSIS — Z23 Encounter for immunization: Secondary | ICD-10-CM | POA: Diagnosis not present

## 2013-01-03 ENCOUNTER — Other Ambulatory Visit: Payer: Self-pay

## 2013-01-10 ENCOUNTER — Other Ambulatory Visit: Payer: Self-pay | Admitting: *Deleted

## 2013-01-10 MED ORDER — DOXAZOSIN MESYLATE 4 MG PO TABS: 6.0000 mg | ORAL_TABLET | Freq: Every day | ORAL | Status: DC

## 2013-01-31 DIAGNOSIS — L821 Other seborrheic keratosis: Secondary | ICD-10-CM | POA: Diagnosis not present

## 2013-01-31 DIAGNOSIS — D485 Neoplasm of uncertain behavior of skin: Secondary | ICD-10-CM | POA: Diagnosis not present

## 2013-01-31 DIAGNOSIS — L570 Actinic keratosis: Secondary | ICD-10-CM | POA: Diagnosis not present

## 2013-01-31 DIAGNOSIS — Z85820 Personal history of malignant melanoma of skin: Secondary | ICD-10-CM | POA: Diagnosis not present

## 2013-01-31 DIAGNOSIS — D1801 Hemangioma of skin and subcutaneous tissue: Secondary | ICD-10-CM | POA: Diagnosis not present

## 2013-02-01 ENCOUNTER — Ambulatory Visit (INDEPENDENT_AMBULATORY_CARE_PROVIDER_SITE_OTHER): Payer: Medicare Other | Admitting: Family Medicine

## 2013-02-01 ENCOUNTER — Encounter: Payer: Self-pay | Admitting: Family Medicine

## 2013-02-01 VITALS — BP 152/84 | HR 68 | Temp 97.7°F | Wt 189.2 lb

## 2013-02-01 DIAGNOSIS — R05 Cough: Secondary | ICD-10-CM | POA: Diagnosis not present

## 2013-02-01 DIAGNOSIS — R059 Cough, unspecified: Secondary | ICD-10-CM | POA: Diagnosis not present

## 2013-02-01 MED ORDER — AZITHROMYCIN 250 MG PO TABS: ORAL_TABLET | ORAL | Status: DC

## 2013-02-01 NOTE — Patient Instructions (Signed)
Keep taking mucinex with plenty of fluids.  If not improved in a few days, then start the antibiotics.  Take care.

## 2013-02-01 NOTE — Progress Notes (Signed)
Pre-visit discussion using our clinic review tool. No additional management support is needed unless otherwise documented below in the visit note.  Sx started about 1 week ago.  Cough is most bothersome. Slight cough initially, then progressive.  Comes and goes, comes in fits.  Sleeping well, no sig cough at night.  Taking mucinex with some temporary relief.  Cough is worse during the day.  Clear sputum. Cough was some better yesterday but then worse again today.  No ear pain. No rhinorrhea.  Sore throat, mild, likely from the cough.  No wheeze, not SOB. No fevers, no rash, no GI sx.    Meds, vitals, and allergies reviewed.   ROS: See HPI.  Otherwise, noncontributory.  GEN: nad, alert and oriented HEENT: mucous membranes moist, tm w/o erythema, nasal exam w/o erythema, clear discharge noted,  OP with mild cobblestoning NECK: supple w/o LA CV: rrr.   PULM: ctab, no inc wob, dry cough noted, no wheeze or rales EXT: no edema SKIN: no acute rash

## 2013-02-03 DIAGNOSIS — R05 Cough: Secondary | ICD-10-CM | POA: Insufficient documentation

## 2013-02-03 DIAGNOSIS — R059 Cough, unspecified: Secondary | ICD-10-CM | POA: Insufficient documentation

## 2013-02-03 NOTE — Assessment & Plan Note (Signed)
Nontoxic, ctab, would hold abx at this point and he'll see if he doesn't improve with OTC meds in the meantime.  ddx d/w pt.  He agrees.  F/u prn.  Supportive care o/w.

## 2013-02-07 ENCOUNTER — Encounter: Payer: Self-pay | Admitting: Family Medicine

## 2013-02-11 ENCOUNTER — Other Ambulatory Visit: Payer: Self-pay | Admitting: Internal Medicine

## 2013-03-18 ENCOUNTER — Encounter: Payer: Self-pay | Admitting: Internal Medicine

## 2013-03-18 ENCOUNTER — Other Ambulatory Visit: Payer: Self-pay | Admitting: Internal Medicine

## 2013-03-18 NOTE — Telephone Encounter (Signed)
Called to [facility].

## 2013-03-18 NOTE — Telephone Encounter (Signed)
Okay #180 x 0 

## 2013-03-18 NOTE — Telephone Encounter (Signed)
Last filled 10/16/12

## 2013-03-20 ENCOUNTER — Encounter: Payer: Medicare Other | Admitting: Internal Medicine

## 2013-04-01 ENCOUNTER — Encounter: Payer: Self-pay | Admitting: Internal Medicine

## 2013-04-18 DIAGNOSIS — Z85828 Personal history of other malignant neoplasm of skin: Secondary | ICD-10-CM | POA: Diagnosis not present

## 2013-04-18 DIAGNOSIS — L578 Other skin changes due to chronic exposure to nonionizing radiation: Secondary | ICD-10-CM | POA: Diagnosis not present

## 2013-04-18 DIAGNOSIS — L908 Other atrophic disorders of skin: Secondary | ICD-10-CM | POA: Diagnosis not present

## 2013-04-18 DIAGNOSIS — L918 Other hypertrophic disorders of the skin: Secondary | ICD-10-CM | POA: Diagnosis not present

## 2013-04-18 DIAGNOSIS — C44319 Basal cell carcinoma of skin of other parts of face: Secondary | ICD-10-CM | POA: Diagnosis not present

## 2013-05-02 DIAGNOSIS — L821 Other seborrheic keratosis: Secondary | ICD-10-CM | POA: Diagnosis not present

## 2013-05-02 DIAGNOSIS — L570 Actinic keratosis: Secondary | ICD-10-CM | POA: Diagnosis not present

## 2013-05-02 DIAGNOSIS — D235 Other benign neoplasm of skin of trunk: Secondary | ICD-10-CM | POA: Diagnosis not present

## 2013-05-02 DIAGNOSIS — Z85820 Personal history of malignant melanoma of skin: Secondary | ICD-10-CM | POA: Diagnosis not present

## 2013-05-04 ENCOUNTER — Encounter: Payer: Self-pay | Admitting: Internal Medicine

## 2013-05-06 MED ORDER — FUROSEMIDE 40 MG PO TABS: 40.0000 mg | ORAL_TABLET | Freq: Every day | ORAL | Status: DC

## 2013-05-11 ENCOUNTER — Encounter: Payer: Self-pay | Admitting: Internal Medicine

## 2013-05-31 ENCOUNTER — Encounter: Payer: Self-pay | Admitting: Internal Medicine

## 2013-06-06 DIAGNOSIS — L570 Actinic keratosis: Secondary | ICD-10-CM | POA: Diagnosis not present

## 2013-06-06 DIAGNOSIS — Z85828 Personal history of other malignant neoplasm of skin: Secondary | ICD-10-CM | POA: Diagnosis not present

## 2013-06-12 ENCOUNTER — Encounter: Payer: Self-pay | Admitting: Internal Medicine

## 2013-06-13 NOTE — Telephone Encounter (Signed)
Dee, can you help me with this? i can see him sometime today. 11:45 or 2:30 would probably work.

## 2013-06-17 ENCOUNTER — Encounter: Payer: Self-pay | Admitting: Family Medicine

## 2013-06-17 ENCOUNTER — Ambulatory Visit (INDEPENDENT_AMBULATORY_CARE_PROVIDER_SITE_OTHER): Payer: Medicare Other | Admitting: Family Medicine

## 2013-06-17 VITALS — BP 132/68 | HR 70 | Temp 97.8°F | Ht 67.0 in | Wt 185.5 lb

## 2013-06-17 DIAGNOSIS — M6530 Trigger finger, unspecified finger: Secondary | ICD-10-CM

## 2013-06-17 NOTE — Progress Notes (Signed)
Trigger fingers.  l 3 and 4  r 3rd.  Asymptomatic. No proc.  >10 minutes spent in face to face time with patient, >50% spent in counselling or coordination of care: this ended up being more of an advice visit. Last week, the patient had his 3rd and 4th digits on the LEFT trigger and be significantly painful. After he unlocked and, he is not having any triggering and he is not having any pain at all. Examining little bit of some triggering on the 3rd digit on the RIGHT hand, but this is not causing him any significant pain or problems. I discussed trigger fingers in general and her treatment.  At this point, think the best advice is to do nothing. If his trigger fingers become more symptomatic and her triggering more frequently, we certainly could inject those again.  She has found a [facility] at [facility] he does do a percutaneous trigger finger release. I reviewed open trigger finger release, which generally only takes about 15 minutes with a very small incision. I reiterated that if I were to have trigger finger release, I would prefer to have the standard well-documented open procedure.  Trigger finger    Follow-up: prn  Signed,  Jiovanna Frei T. Ventura Hollenbeck, MD, [place] at [facility] [place] [address] Phone: [phone] Fax: [phone]  Patient's Medications  New Prescriptions   No medications on file  Previous Medications   ASPIRIN 81 MG TABLET    Take 81 mg by mouth every other day.    BUPROPION (WELLBUTRIN XL) 150 MG 24 HR TABLET    Take 1 tablet (150 mg total) by mouth daily.   CLONAZEPAM (KLONOPIN) 0.5 MG TABLET    TAKE 1 OR 2 TABLETS BY MOUTH AT BEDTIME   DOXAZOSIN (CARDURA) 4 MG TABLET    Take 1.5 tablets (6 mg total) by mouth at bedtime.   FINASTERIDE (PROSCAR) 5 MG TABLET    TAKE ONE TABLET BY MOUTH EVERY DAY   FISH OIL-OMEGA-3 FATTY ACIDS 1000 MG CAPSULE    Take 1 g by mouth daily.    LOSARTAN-HYDROCHLOROTHIAZIDE (HYZAAR)  100-25 MG PER TABLET    Take 1 tablet by mouth daily.   MULTIPLE VITAMIN (MULTIVITAMIN) CAPSULE    Take 1 capsule by mouth daily.     OMEPRAZOLE (PRILOSEC) 20 MG CAPSULE    TAKE ONE CAPSULE BY MOUTH EVERY DAY   PRAMIPEXOLE (MIRAPEX) 1 MG TABLET    Take 1-2 tablets (1-2 mg total) by mouth at bedtime. For restless legs  Modified Medications   No medications on file  Discontinued Medications   AZITHROMYCIN (ZITHROMAX) 250 MG TABLET    2 tabs a day for 1 day and then 1 a day for 4 days.   FUROSEMIDE (LASIX) 40 MG TABLET    Take 1 tablet (40 mg total) by mouth daily. --up to 3 days in a row only   SILDENAFIL (VIAGRA) 100 MG TABLET    Take 0.5-1 tablets (50-100 mg total) by mouth daily as needed for erectile dysfunction.

## 2013-06-17 NOTE — Progress Notes (Signed)
Pre visit review using our clinic review tool, if applicable. No additional management support is needed unless otherwise documented below in the visit note. 

## 2013-06-27 ENCOUNTER — Encounter: Payer: Self-pay | Admitting: Family Medicine

## 2013-06-27 ENCOUNTER — Ambulatory Visit (INDEPENDENT_AMBULATORY_CARE_PROVIDER_SITE_OTHER): Payer: Medicare Other | Admitting: Family Medicine

## 2013-06-27 VITALS — BP 132/84 | HR 58 | Temp 97.7°F | Ht 67.0 in | Wt 187.5 lb

## 2013-06-27 DIAGNOSIS — M6530 Trigger finger, unspecified finger: Secondary | ICD-10-CM | POA: Diagnosis not present

## 2013-06-27 DIAGNOSIS — M65342 Trigger finger, left ring finger: Secondary | ICD-10-CM

## 2013-06-27 DIAGNOSIS — M65332 Trigger finger, left middle finger: Secondary | ICD-10-CM

## 2013-06-27 DIAGNOSIS — M65331 Trigger finger, right middle finger: Secondary | ICD-10-CM

## 2013-06-27 NOTE — Progress Notes (Signed)
Pre visit review using our clinic review tool, if applicable. No additional management support is needed unless otherwise documented below in the visit note. 

## 2013-06-27 NOTE — Progress Notes (Signed)
Per my last office note, return for trigger finger injections.  Trigger Finger Injection, R MIDDLE Verbal consent was obtained. Risks (including rare risk of infection, potential risk for skin lightening and potential atrophy), benefits and alternatives were discussed. Prepped with Chloraprep and Ethyl Chloride used for anesthesia. Under sterile conditions, patient injected at palmar crease aiming distally with 45 degree angle towards nodule; injected directly into tendon sheath. Medication flowed freely without resistance.  Needle size: 22 gauge 1 1/2 inch Injection: 1/2 cc of Lidocaine 1% and Depo-Medrol 20 mg   Prior to L hand procedure, I performed a metacarpal block at the 3rd and 4th for anesthesia.  Trigger Finger Injection, L MIDDLE Verbal consent was obtained. Risks (including rare risk of infection, potential risk for skin lightening and potential atrophy), benefits and alternatives were discussed. Prepped with Chloraprep and Ethyl Chloride used for anesthesia. Under sterile conditions, patient injected at palmar crease aiming distally with 45 degree angle towards nodule; injected directly into tendon sheath. Medication flowed freely without resistance.  Needle size: 22 gauge 1 1/2 inch Injection: 1/2 cc of Lidocaine 1% and Depo-Medrol 20 mg  Trigger Finger Injection, L RING Verbal consent was obtained. Risks (including rare risk of infection, potential risk for skin lightening and potential atrophy), benefits and alternatives were discussed. Prepped with Chloraprep and Ethyl Chloride used for anesthesia. Under sterile conditions, patient injected at palmar crease aiming distally with 45 degree angle towards nodule; injected directly into tendon sheath. Medication flowed freely without resistance.  Needle size: 22 gauge 1 1/2 inch Injection: 1/2 cc of Lidocaine 1% and Depo-Medrol 20 mg   Signed,  Lotoya Casella T. Ronen Bromwell, MD, [place]

## 2013-07-08 ENCOUNTER — Other Ambulatory Visit: Payer: Self-pay | Admitting: Internal Medicine

## 2013-07-19 DIAGNOSIS — L570 Actinic keratosis: Secondary | ICD-10-CM | POA: Diagnosis not present

## 2013-08-19 DIAGNOSIS — L570 Actinic keratosis: Secondary | ICD-10-CM | POA: Diagnosis not present

## 2013-08-29 DIAGNOSIS — H2510 Age-related nuclear cataract, unspecified eye: Secondary | ICD-10-CM | POA: Diagnosis not present

## 2013-08-29 DIAGNOSIS — Z961 Presence of intraocular lens: Secondary | ICD-10-CM | POA: Diagnosis not present

## 2013-09-02 ENCOUNTER — Other Ambulatory Visit: Payer: Self-pay | Admitting: Internal Medicine

## 2013-09-02 NOTE — Telephone Encounter (Signed)
03/18/13 

## 2013-09-02 NOTE — Telephone Encounter (Signed)
Okay #180 x 0 

## 2013-09-02 NOTE — Telephone Encounter (Signed)
Rx called in to pharmacy. 

## 2013-09-05 ENCOUNTER — Encounter: Payer: Self-pay | Admitting: Internal Medicine

## 2013-09-05 DIAGNOSIS — D1801 Hemangioma of skin and subcutaneous tissue: Secondary | ICD-10-CM | POA: Diagnosis not present

## 2013-09-05 DIAGNOSIS — L570 Actinic keratosis: Secondary | ICD-10-CM | POA: Diagnosis not present

## 2013-09-05 DIAGNOSIS — Z85820 Personal history of malignant melanoma of skin: Secondary | ICD-10-CM | POA: Diagnosis not present

## 2013-09-05 DIAGNOSIS — D485 Neoplasm of uncertain behavior of skin: Secondary | ICD-10-CM | POA: Diagnosis not present

## 2013-09-05 DIAGNOSIS — D235 Other benign neoplasm of skin of trunk: Secondary | ICD-10-CM | POA: Diagnosis not present

## 2013-09-16 ENCOUNTER — Other Ambulatory Visit: Payer: Self-pay | Admitting: Internal Medicine

## 2013-09-29 ENCOUNTER — Encounter: Payer: Self-pay | Admitting: Internal Medicine

## 2013-09-30 ENCOUNTER — Other Ambulatory Visit: Payer: Self-pay | Admitting: Internal Medicine

## 2013-10-04 ENCOUNTER — Encounter: Payer: Self-pay | Admitting: Internal Medicine

## 2013-10-30 ENCOUNTER — Encounter: Payer: Self-pay | Admitting: Internal Medicine

## 2013-11-16 ENCOUNTER — Encounter: Payer: Self-pay | Admitting: Internal Medicine

## 2013-11-20 ENCOUNTER — Ambulatory Visit: Payer: PRIVATE HEALTH INSURANCE | Admitting: Internal Medicine

## 2013-11-22 ENCOUNTER — Ambulatory Visit (INDEPENDENT_AMBULATORY_CARE_PROVIDER_SITE_OTHER): Payer: Medicare Other | Admitting: Internal Medicine

## 2013-11-22 ENCOUNTER — Encounter: Payer: Self-pay | Admitting: Internal Medicine

## 2013-11-22 VITALS — BP 140/80 | HR 73 | Temp 98.0°F | Wt 187.0 lb

## 2013-11-22 DIAGNOSIS — M79609 Pain in unspecified limb: Secondary | ICD-10-CM

## 2013-11-22 DIAGNOSIS — M79605 Pain in left leg: Secondary | ICD-10-CM | POA: Insufficient documentation

## 2013-11-22 NOTE — Progress Notes (Signed)
Subjective:    Patient ID: Stephen Garrett, male    DOB: 1923-10-18, 80 y.o.   MRN: [id number]  HPI Having problems with his left leg Can run fine at times, other times he has pain in different spots--like ankle, near knee or hip May occur separately or together Seems to be more common after "walking a lot"  Like a few hundred yards Still plays doubles tennis--doesn't occur then  Will have leg weakness at times Occurs along with the pain--hard to even lift leg Hasn't fallen Will decrease his walking speed until he gets where he is going  Mild lower left back pain at times No specific joint pains in leg No ankle or knee swelling  Current Outpatient Prescriptions on File Prior to Visit  Medication Sig Dispense Refill  . aspirin 81 MG tablet Take 81 mg by mouth every other day.       Marland Kitchen buPROPion (WELLBUTRIN XL) 150 MG 24 hr tablet TAKE ONE TABLET BY MOUTH EVERY DAY  90 tablet  3  . clonazePAM (KLONOPIN) 0.5 MG tablet TAKE 1 OR 2 TABLETS BY MOUTH AT BEDTIME  180 tablet  0  . doxazosin (CARDURA) 4 MG tablet TAKE ONE AND ONE-HALF TABLETS AT BEDTIME  135 tablet  3  . finasteride (PROSCAR) 5 MG tablet TAKE ONE TABLET BY MOUTH EVERY DAY  90 tablet  3  . fish oil-omega-3 fatty acids 1000 MG capsule Take 1 g by mouth every other day.       . losartan-hydrochlorothiazide (HYZAAR) 100-25 MG per tablet TAKE ONE TABLET BY MOUTH EVERY DAY  90 tablet  3  . Multiple Vitamin (MULTIVITAMIN) capsule Take 1 capsule by mouth daily.        Marland Kitchen omeprazole (PRILOSEC) 20 MG capsule TAKE ONE CAPSULE BY MOUTH EVERY DAY  90 capsule  3  . pramipexole (MIRAPEX) 1 MG tablet TAKE 1-2 TABLETS(1-2 TOTAL) BY MOUTH AT BEDTIME.  FOR RESTLESS LEGS  180 tablet  0  . [DISCONTINUED] citalopram (CELEXA) 10 MG tablet Take 1 tablet (10 mg total) by mouth daily.  30 tablet  5   No current facility-administered medications on file prior to visit.    Allergies  Allergen Reactions  . Atorvastatin     REACTION: raises blood  pressure  . Pravastatin Sodium     REACTION: raises blood pressure  . Statins     REACTION: raises bp    Past Medical History  Diagnosis Date  . Diverticulosis of colon 06/2003  . GERD (gastroesophageal reflux disease)   . HLD (hyperlipidemia)   . HTN (hypertension)   . BPH (benign prostatic hypertrophy)   . Osteoarthritis of knee     severe, bilat  . Renal insufficiency   . Actinic keratosis   . Diseases of lips   . ED (erectile dysfunction)   . Restless legs syndrome (RLS)   . Sleep disturbance, unspecified   . Spinal stenosis, lumbar region, without neurogenic claudication     Past Surgical History  Procedure Laterality Date  . Finger surgery      Right 4th finger  . Tonsillectomy  childhood  . Lumbar spine surgery  06/2005    stenosis repair  . Total knee arthroplasty  06/2008    bilat (Dr. Marry Guan)  . Cataract extraction, bilateral  2011    Dr. Thomasene Ripple  . Lih  10/2009    Dr. Bary Castilla    Family History  Problem Relation Age of Onset  . Other Father     "  clogged arteries"  . Hypertension Mother     History   Social History  . Marital Status: Widowed    Spouse Name: N/A    Number of Children: 2  . Years of Education: N/A   Occupational History  . Retired    Social History Main Topics  . Smoking status: Former Smoker    Quit date: 02/28/1970  . Smokeless tobacco: Never Used  . Alcohol Use: Yes     Comment: 1-2 glasses wine/daily  . Drug Use: No  . Sexual Activity: Not on file   Other Topics Concern  . Not on file   Social History Narrative   Widowed 2010   2 adopted children   Has living will   Son is health care power of attorney   Would accept resuscitation but no prolonged ventilation   No tube feeds if cognitively unaware   Review of Systems Bowels are okay---no loss of control Voids fine Appetite is fine Has back stiffness in AM---takes a while to loosen up     Objective:   Physical Exam  Cardiovascular:  Normal pulse in right  foot Diminished but palpable on the left  Musculoskeletal:  No spine tenderness SLR negative bilaterally Mild pelvic pain with internal rotation of left hip--but not what he otherwise gets Knee and ankle are fine  Neurological:  Gait is normal other than having short stride No focal leg weakness          Assessment & Plan:

## 2013-11-22 NOTE — Assessment & Plan Note (Signed)
I suspect this is his spinal stenosis--and I told him that I doubt it would be a good idea to look at more surgery I am concerned about a possible treatable vascular problem--will check ABI If that is normal-- he will probably at least go back to the surgeon who worked on his back for a reeval

## 2013-11-22 NOTE — Progress Notes (Signed)
Pre visit review using our clinic review tool, if applicable. No additional management support is needed unless otherwise documented below in the visit note. 

## 2013-11-22 NOTE — Patient Instructions (Signed)
If the circulation test is normal, you can call Dr Burman Riis for a reevaluation of your back.

## 2013-11-26 ENCOUNTER — Other Ambulatory Visit (HOSPITAL_COMMUNITY): Payer: Self-pay | Admitting: *Deleted

## 2013-11-26 DIAGNOSIS — I70219 Atherosclerosis of native arteries of extremities with intermittent claudication, unspecified extremity: Secondary | ICD-10-CM

## 2013-11-28 ENCOUNTER — Encounter: Payer: Self-pay | Admitting: Internal Medicine

## 2013-11-29 ENCOUNTER — Encounter (INDEPENDENT_AMBULATORY_CARE_PROVIDER_SITE_OTHER): Payer: Medicare Other

## 2013-11-29 DIAGNOSIS — I70212 Atherosclerosis of native arteries of extremities with intermittent claudication, left leg: Secondary | ICD-10-CM | POA: Diagnosis not present

## 2013-11-29 DIAGNOSIS — I70219 Atherosclerosis of native arteries of extremities with intermittent claudication, unspecified extremity: Secondary | ICD-10-CM

## 2013-11-29 DIAGNOSIS — M79605 Pain in left leg: Secondary | ICD-10-CM

## 2013-12-12 DIAGNOSIS — D225 Melanocytic nevi of trunk: Secondary | ICD-10-CM | POA: Diagnosis not present

## 2013-12-12 DIAGNOSIS — X32XXXA Exposure to sunlight, initial encounter: Secondary | ICD-10-CM | POA: Diagnosis not present

## 2013-12-12 DIAGNOSIS — Z85820 Personal history of malignant melanoma of skin: Secondary | ICD-10-CM | POA: Diagnosis not present

## 2013-12-12 DIAGNOSIS — L570 Actinic keratosis: Secondary | ICD-10-CM | POA: Diagnosis not present

## 2013-12-12 DIAGNOSIS — L821 Other seborrheic keratosis: Secondary | ICD-10-CM | POA: Diagnosis not present

## 2013-12-19 DIAGNOSIS — M4806 Spinal stenosis, lumbar region: Secondary | ICD-10-CM | POA: Diagnosis not present

## 2013-12-19 DIAGNOSIS — M5136 Other intervertebral disc degeneration, lumbar region: Secondary | ICD-10-CM | POA: Diagnosis not present

## 2013-12-20 ENCOUNTER — Encounter: Payer: Self-pay | Admitting: *Deleted

## 2013-12-20 ENCOUNTER — Encounter: Payer: Self-pay | Admitting: Internal Medicine

## 2013-12-20 ENCOUNTER — Ambulatory Visit (INDEPENDENT_AMBULATORY_CARE_PROVIDER_SITE_OTHER): Payer: Medicare Other | Admitting: Internal Medicine

## 2013-12-20 VITALS — BP 144/90 | HR 62 | Temp 97.5°F | Ht 67.0 in | Wt 190.0 lb

## 2013-12-20 DIAGNOSIS — G2581 Restless legs syndrome: Secondary | ICD-10-CM

## 2013-12-20 DIAGNOSIS — M48061 Spinal stenosis, lumbar region without neurogenic claudication: Secondary | ICD-10-CM

## 2013-12-20 DIAGNOSIS — Z0000 Encounter for general adult medical examination without abnormal findings: Secondary | ICD-10-CM | POA: Diagnosis not present

## 2013-12-20 DIAGNOSIS — Z7189 Other specified counseling: Secondary | ICD-10-CM | POA: Diagnosis not present

## 2013-12-20 DIAGNOSIS — Z23 Encounter for immunization: Secondary | ICD-10-CM

## 2013-12-20 DIAGNOSIS — I10 Essential (primary) hypertension: Secondary | ICD-10-CM

## 2013-12-20 DIAGNOSIS — F39 Unspecified mood [affective] disorder: Secondary | ICD-10-CM

## 2013-12-20 DIAGNOSIS — M4806 Spinal stenosis, lumbar region: Secondary | ICD-10-CM

## 2013-12-20 DIAGNOSIS — Z79899 Other long term (current) drug therapy: Secondary | ICD-10-CM | POA: Diagnosis not present

## 2013-12-20 DIAGNOSIS — N183 Chronic kidney disease, stage 3 unspecified: Secondary | ICD-10-CM

## 2013-12-20 LAB — COMPREHENSIVE METABOLIC PANEL
ALT: 21 U/L (ref 0–53)
AST: 26 U/L (ref 0–37)
Albumin: 3.6 g/dL (ref 3.5–5.2)
Alkaline Phosphatase: 85 U/L (ref 39–117)
BUN: 28 mg/dL — ABNORMAL HIGH (ref 6–23)
CO2: 21 mEq/L (ref 19–32)
Calcium: 9.5 mg/dL (ref 8.4–10.5)
Chloride: 103 mEq/L (ref 96–112)
Creatinine, Ser: 1.6 mg/dL — ABNORMAL HIGH (ref 0.4–1.5)
GFR: 44.83 mL/min — ABNORMAL LOW (ref >60.00)
Glucose, Bld: 92 mg/dL (ref 70–99)
Potassium: 3.5 mEq/L (ref 3.5–5.1)
Sodium: 138 mEq/L (ref 135–145)
Total Bilirubin: 0.9 mg/dL (ref 0.2–1.2)
Total Protein: 6.9 g/dL (ref 6.0–8.3)

## 2013-12-20 LAB — LIPID PANEL
Cholesterol: 228 mg/dL — ABNORMAL HIGH (ref 0–200)
HDL: 37.1 mg/dL — ABNORMAL LOW (ref >39.00)
LDL Cholesterol: 165 mg/dL — ABNORMAL HIGH (ref 0–99)
NonHDL: 190.9
Total CHOL/HDL Ratio: 6
Triglycerides: 131 mg/dL (ref 0.0–149.0)
VLDL: 26.2 mg/dL (ref 0.0–40.0)

## 2013-12-20 LAB — CBC WITH DIFFERENTIAL/PLATELET
Basophils Absolute: 0 10*3/uL (ref 0.0–0.1)
Basophils Relative: 0.3 % (ref 0.0–3.0)
Eosinophils Absolute: 0.1 10*3/uL (ref 0.0–0.7)
Eosinophils Relative: 2.1 % (ref 0.0–5.0)
HCT: 41 % (ref 39.0–52.0)
Hemoglobin: 13.5 g/dL (ref 13.0–17.0)
Lymphocytes Relative: 28.4 % (ref 12.0–46.0)
Lymphs Abs: 1.7 10*3/uL (ref 0.7–4.0)
MCHC: 33 g/dL (ref 30.0–36.0)
MCV: 92.4 fl (ref 78.0–100.0)
Monocytes Absolute: 0.4 10*3/uL (ref 0.1–1.0)
Monocytes Relative: 6.5 % (ref 3.0–12.0)
Neutro Abs: 3.7 10*3/uL (ref 1.4–7.7)
Neutrophils Relative %: 62.7 % (ref 43.0–77.0)
Platelets: 160 10*3/uL (ref 150.0–400.0)
RBC: 4.44 Mil/uL (ref 4.22–5.81)
RDW: 14.1 % (ref 11.5–15.5)
WBC: 5.9 10*3/uL (ref 4.0–10.5)

## 2013-12-20 LAB — T4, FREE: Free T4: 0.84 ng/dL (ref 0.60–1.60)

## 2013-12-20 NOTE — Assessment & Plan Note (Signed)
Mostly anxiety and some dysthymia in past Fine on the bupropion

## 2013-12-20 NOTE — Patient Instructions (Signed)
Please try miralax --1 capful with full glass of water-- every day or every other day--to keep your bowels regular.

## 2013-12-20 NOTE — Assessment & Plan Note (Signed)
I have personally reviewed the Medicare Annual Wellness questionnaire and have noted 1. The patient's medical and social history 2. Their use of alcohol, tobacco or illicit drugs 3. Their current medications and supplements 4. The patient's functional ability including ADL's, fall risks, home safety risks and hearing or visual             impairment. 5. Diet and physical activities 6. Evidence for depression or mood disorders  The patients weight, height, BMI and visual acuity have been recorded in the chart I have made referrals, counseling and provided education to the patient based review of the above and I have provided the pt with a written personalized care plan for preventive services.  I have provided you with a copy of your personalized plan for preventive services. Please take the time to review along with your updated medication list.  Flu and prevnar today No cancer screening due to age

## 2013-12-20 NOTE — Progress Notes (Signed)
   Subjective:    Patient ID: Stephen Garrett, male    DOB: 10-Mar-1923, 80 y.o.   MRN: [id number]  HPI    Review of Systems     Objective:   Physical Exam  Musculoskeletal:  Trace pitting calf edema          Assessment & Plan:

## 2013-12-20 NOTE — Progress Notes (Signed)
Pre visit review using our clinic review tool, if applicable. No additional management support is needed unless otherwise documented below in the visit note. 

## 2013-12-20 NOTE — Progress Notes (Signed)
Subjective:    Patient ID: Stephen Garrett, male    DOB: [birth year], 80 y.o.   MRN: [id number]  HPI Here for Medicare wellness and follow up Reviewed form and advanced directives Reviewed other doctors Glass of wine daily No tobacco Regular exercise Independent with all instrumental ADLs Chronic memory issues all his life--no major decline No procedures in past year No falls Vision is fine. Hearing is not too good  Just saw Dr Burman Riis about his leg pain Given gabapentin for night---seemed to help Also diclofenac--told him not to take this due to renal issues  Generally sleeps okay Restless legs generally controlled  No chest pain No SOB Still playing tennis---mostly doubles with occasional singles No dizziness or syncope No edema  Mood is good No depression Anxiety seems better since on the bupropion Uses clonazepam only at bedtime  Voids okay Nocturia several times still--better with first dose of gabapentin Some daytime urgency Rare urge leaking  Current Outpatient Prescriptions on File Prior to Visit  Medication Sig Dispense Refill  . acetaminophen (TYLENOL) 650 MG CR tablet Take 650 mg by mouth at bedtime.      Marland Kitchen aspirin 81 MG tablet Take 81 mg by mouth every other day.       Marland Kitchen buPROPion (WELLBUTRIN XL) 150 MG 24 hr tablet TAKE ONE TABLET BY MOUTH EVERY DAY  90 tablet  3  . clonazePAM (KLONOPIN) 0.5 MG tablet TAKE 1 OR 2 TABLETS BY MOUTH AT BEDTIME  180 tablet  0  . doxazosin (CARDURA) 4 MG tablet TAKE ONE AND ONE-HALF TABLETS AT BEDTIME  135 tablet  3  . finasteride (PROSCAR) 5 MG tablet TAKE ONE TABLET BY MOUTH EVERY DAY  90 tablet  3  . fish oil-omega-3 fatty acids 1000 MG capsule Take 1 g by mouth every other day.       . losartan-hydrochlorothiazide (HYZAAR) 100-25 MG per tablet TAKE ONE TABLET BY MOUTH EVERY DAY  90 tablet  3  . Multiple Vitamin (MULTIVITAMIN) capsule Take 1 capsule by mouth daily.        Marland Kitchen omeprazole (PRILOSEC) 20 MG capsule TAKE ONE CAPSULE  BY MOUTH EVERY DAY  90 capsule  3  . pramipexole (MIRAPEX) 1 MG tablet TAKE 1-2 TABLETS(1-2 TOTAL) BY MOUTH AT BEDTIME.  FOR RESTLESS LEGS  180 tablet  0  . [DISCONTINUED] citalopram (CELEXA) 10 MG tablet Take 1 tablet (10 mg total) by mouth daily.  30 tablet  5   No current facility-administered medications on file prior to visit.    Allergies  Allergen Reactions  . Atorvastatin     REACTION: raises blood pressure  . Pravastatin Sodium     REACTION: raises blood pressure  . Statins     REACTION: raises bp    Past Medical History  Diagnosis Date  . Diverticulosis of colon 06/2003  . GERD (gastroesophageal reflux disease)   . HLD (hyperlipidemia)   . HTN (hypertension)   . BPH (benign prostatic hypertrophy)   . Osteoarthritis of knee     severe, bilat  . Renal insufficiency   . Actinic keratosis   . Diseases of lips   . ED (erectile dysfunction)   . Restless legs syndrome (RLS)   . Sleep disturbance, unspecified   . Spinal stenosis, lumbar region, without neurogenic claudication     Past Surgical History  Procedure Laterality Date  . Finger surgery      Right 4th finger  . Tonsillectomy  childhood  . Lumbar spine  surgery  06/2005    stenosis repair  . Total knee arthroplasty  06/2008    bilat (Dr. Marry Guan)  . Cataract extraction, bilateral  2011    Dr. Thomasene Ripple  . Lih  10/2009    Dr. Bary Castilla    Family History  Problem Relation Age of Onset  . Other Father     "clogged arteries"  . Hypertension Mother     History   Social History  . Marital Status: Widowed    Spouse Name: N/A    Number of Children: 2  . Years of Education: N/A   Occupational History  . Retired    Social History Main Topics  . Smoking status: Former Smoker    Quit date: 02/28/1970  . Smokeless tobacco: Never Used  . Alcohol Use: Yes     Comment: 1-2 glasses wine/daily  . Drug Use: No  . Sexual Activity: Not on file   Other Topics Concern  . Not on file   Social History  Narrative   Widowed 2010   2 adopted children   Has living will   Son is health care power of attorney   Would accept resuscitation but no prolonged ventilation   No tube feeds if cognitively unaware   Review of Systems Appetite is "too good" Weight is stable Bowels are slow at times--- has to "struggle" No skin problems "Often" wears seat belt    Objective:   Physical Exam  Constitutional: He is oriented to person, place, and time. He appears well-developed and well-nourished. No distress.  Neck: Normal range of motion. Neck supple. No thyromegaly present.  Cardiovascular: Normal rate, regular rhythm and normal heart sounds.  Exam reveals no gallop.   No murmur heard. Faint right pedal pulse. Not really palpable on left  Pulmonary/Chest: Effort normal and breath sounds normal. No respiratory distress. He has no wheezes. He has no rales.  Abdominal: Soft. There is no tenderness.  Musculoskeletal: He exhibits no edema and no tenderness.  Lymphadenopathy:    He has no cervical adenopathy.  Neurological: He is alert and oriented to person, place, and time.  President -- "Obama, Anne Hahn....." didn't get Clinton [id number] D-l-r-o-w Recall 1/3  Skin: No rash noted. No erythema.  Psychiatric: He has a normal mood and affect. His behavior is normal.          Assessment & Plan:

## 2013-12-20 NOTE — Assessment & Plan Note (Signed)
Cause of his leg pain Okay to take gabapentin but not NSAID

## 2013-12-20 NOTE — Addendum Note (Signed)
Addended by: Despina Hidden on: 12/20/2013 02:37 PM   Modules accepted: Orders

## 2013-12-20 NOTE — Assessment & Plan Note (Signed)
BP Readings from Last 3 Encounters:  12/20/13 144/90  11/22/13 140/80  06/27/13 132/84   Reasonable control at his age

## 2013-12-20 NOTE — Assessment & Plan Note (Signed)
See social history 

## 2013-12-20 NOTE — Assessment & Plan Note (Signed)
Does okay with his regimen

## 2013-12-20 NOTE — Assessment & Plan Note (Signed)
Will recheck today On ACEI No NSAIDs please

## 2013-12-21 ENCOUNTER — Encounter: Payer: Self-pay | Admitting: Internal Medicine

## 2013-12-24 ENCOUNTER — Telehealth: Payer: Self-pay | Admitting: Internal Medicine

## 2013-12-24 NOTE — Telephone Encounter (Signed)
emmi emailed °

## 2013-12-30 ENCOUNTER — Other Ambulatory Visit: Payer: Self-pay | Admitting: Internal Medicine

## 2014-01-03 ENCOUNTER — Encounter: Payer: Self-pay | Admitting: Internal Medicine

## 2014-01-04 IMAGING — CR DG SHOULDER 2+V*L*
4 series · 4 of 4 positions shown · non-contrast
Comparison: None.

CLINICAL DATA: Left shoulder pain, initial encounter.

LEFT SHOULDER - 2+ VIEW

[view not recorded (1 of 4)]
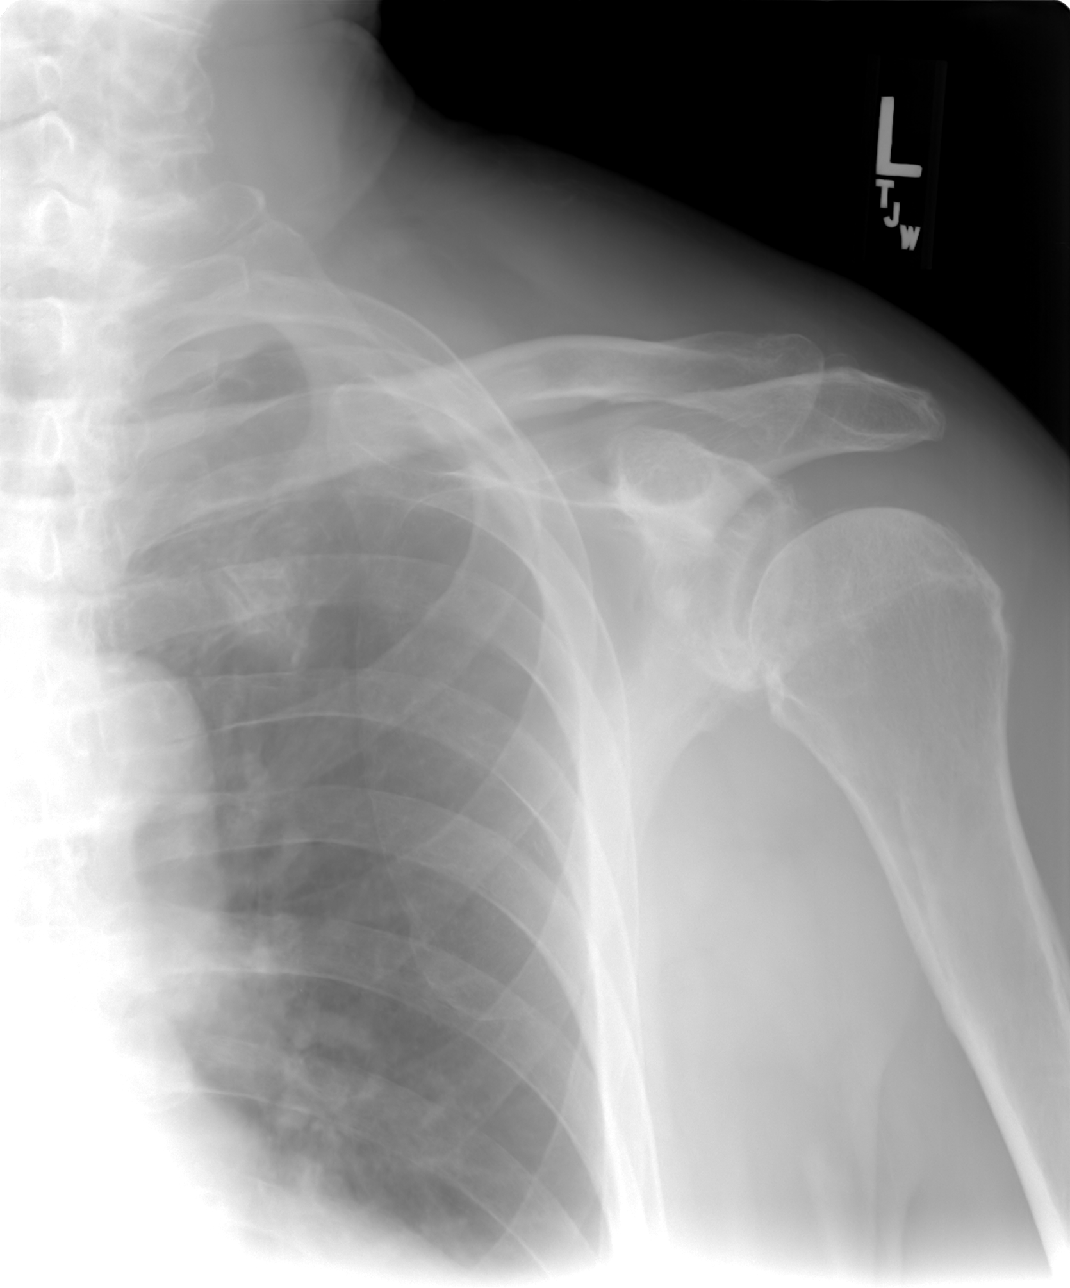

[view not recorded (2 of 4)]
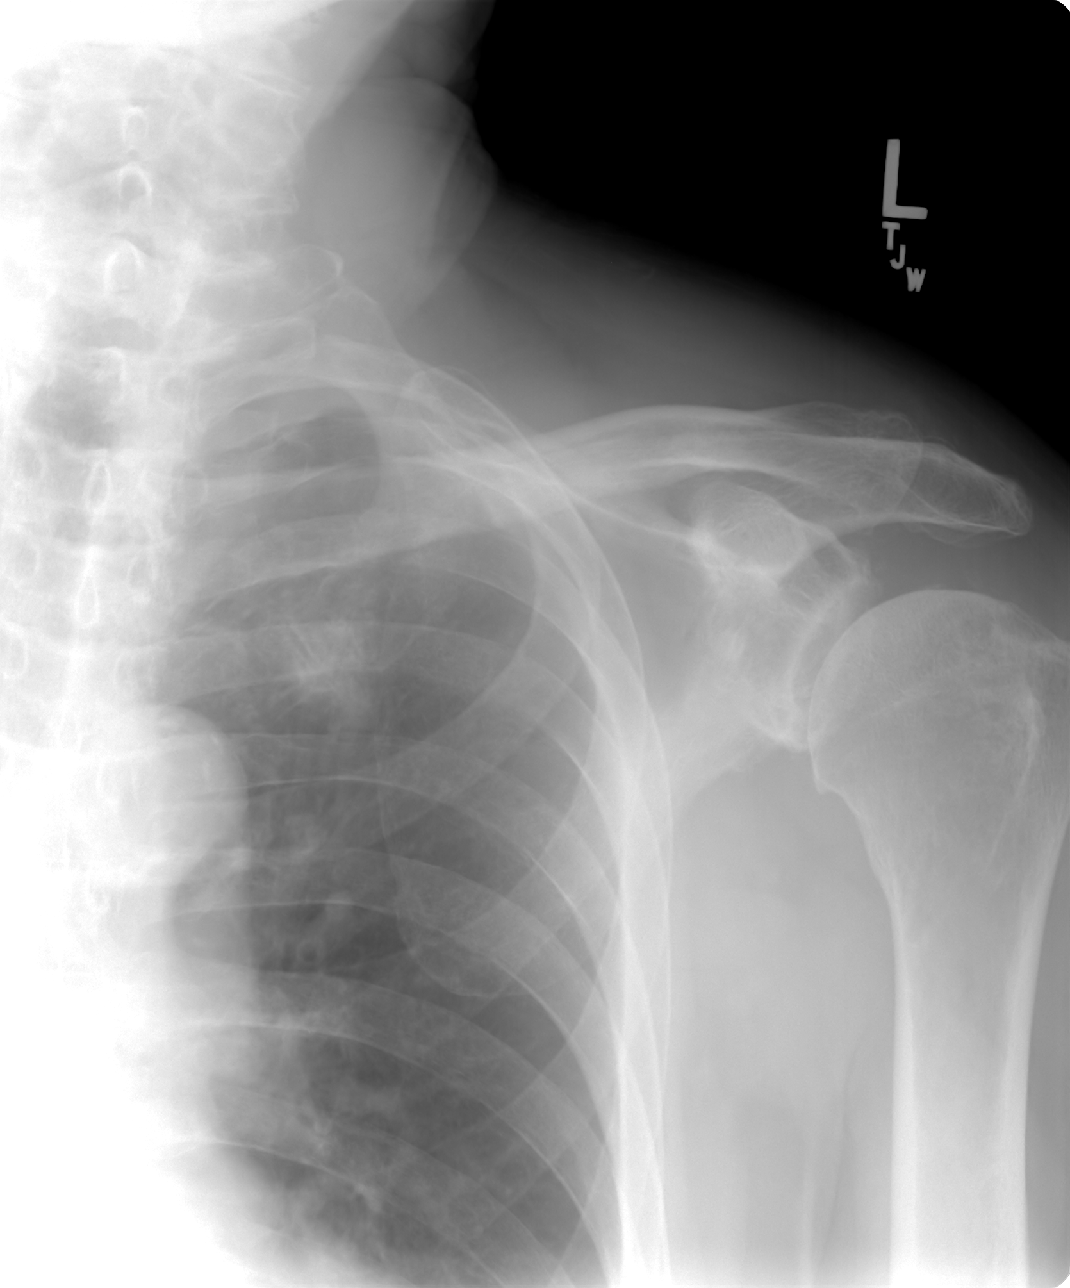

[view not recorded (3 of 4)]
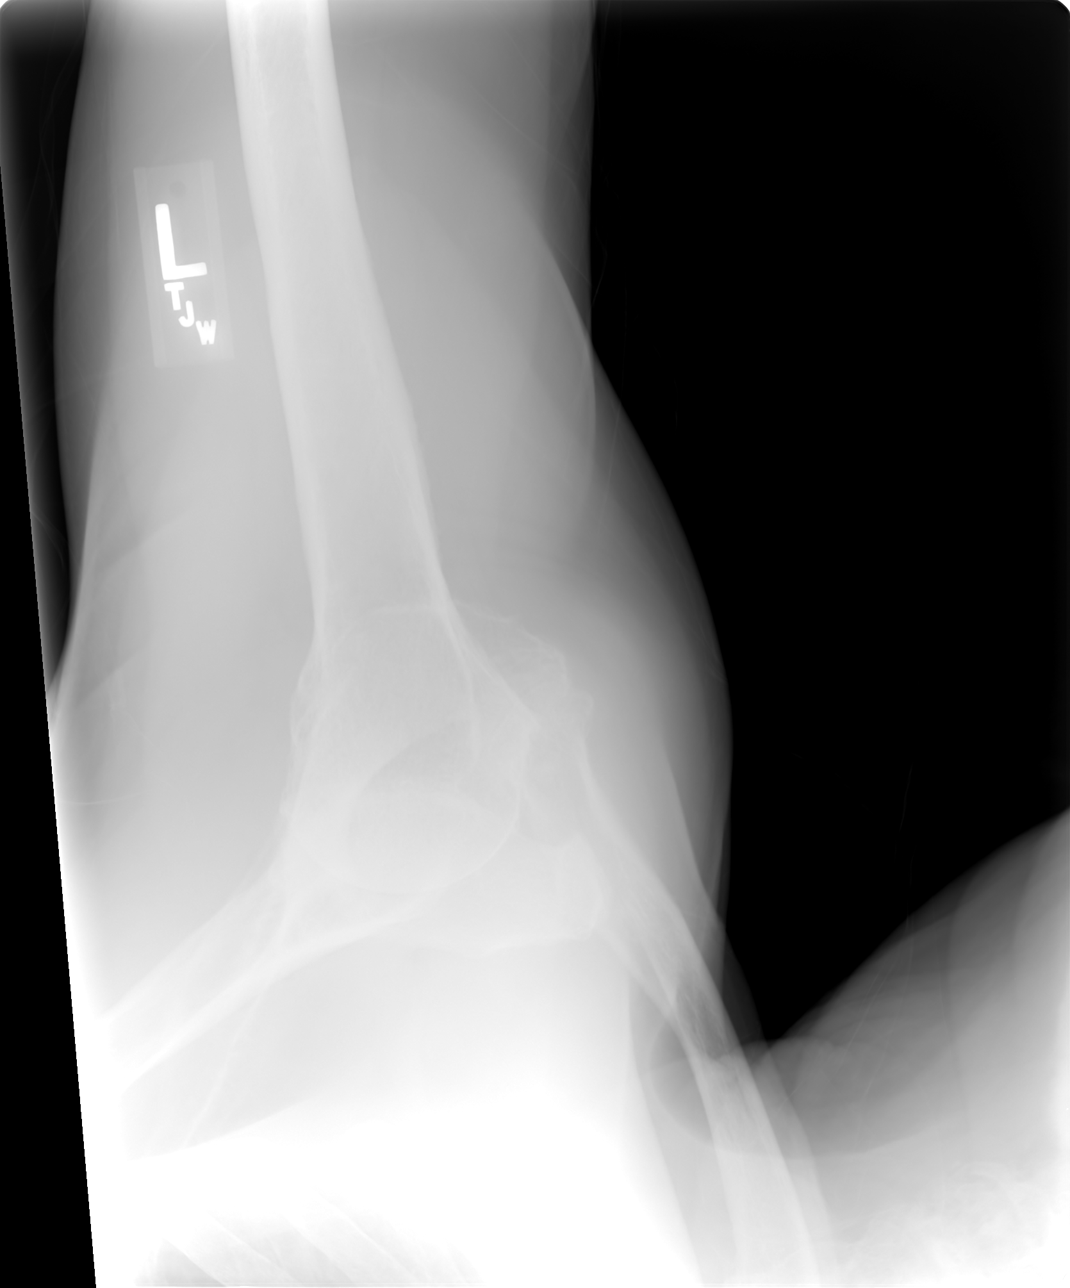

[view not recorded (4 of 4)]
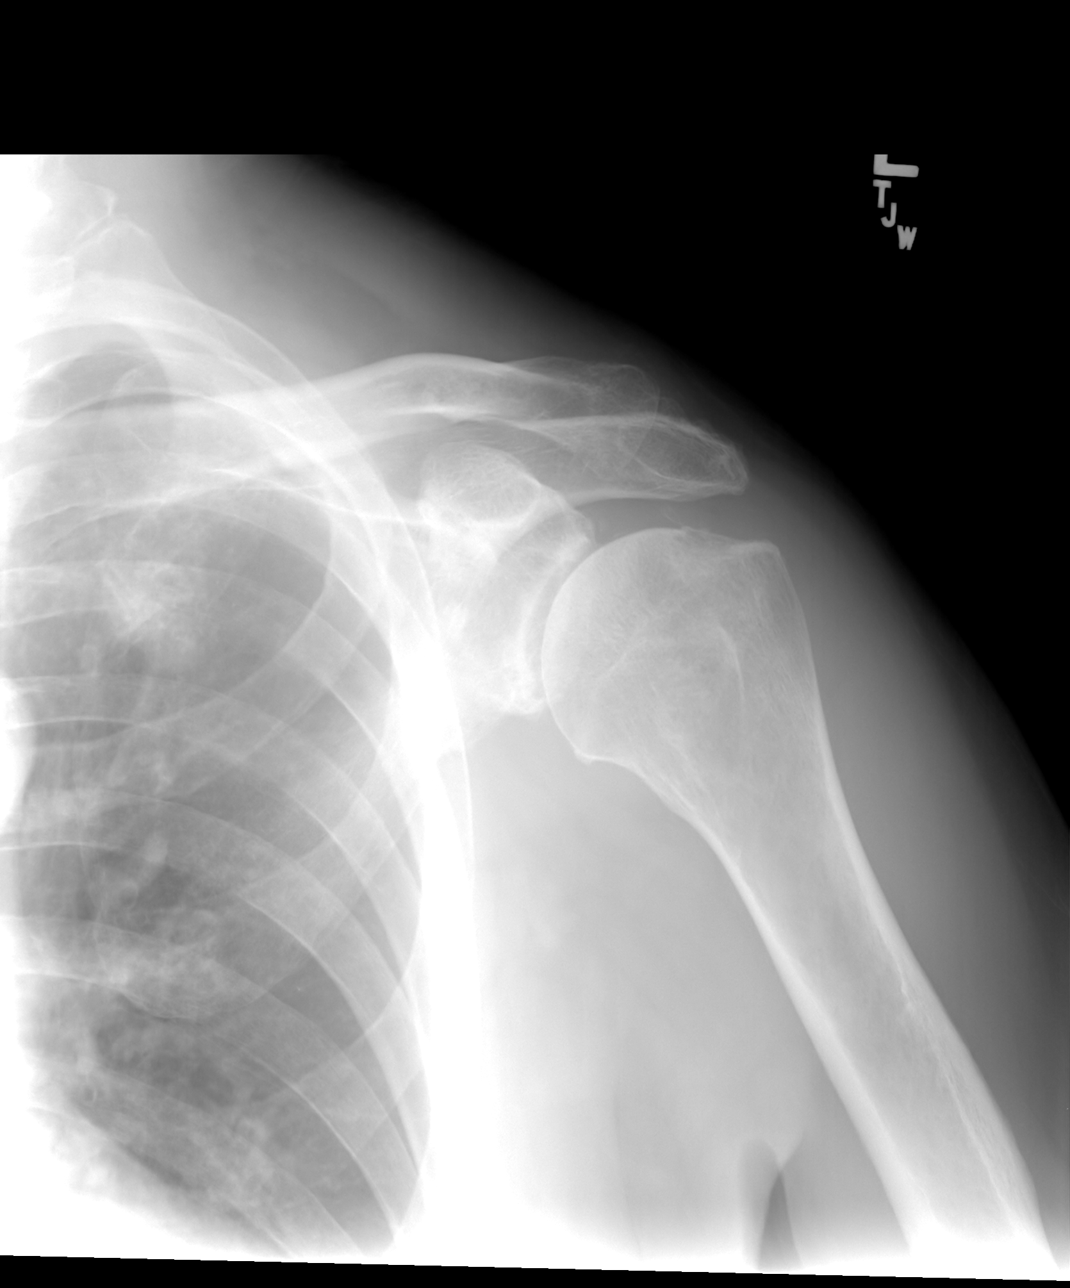

[4 of 4 positions shown; findings below may reference images not displayed]

FINDINGS: No fracture or dislocation.  Moderate degenerative change of the
left glenohumeral joint with joint space loss, subchondral
sclerosis and osteophytosis.  Suspect mild degenerative change of
the left AC joint with joint space loss and minimal inferiorly-
directed osteophytosis.  Possible minimal amount of
chondrocalcinosis.

Limited visualization of the adjacent thorax is normal.  Regional
soft tissues are normal.
IMPRESSION: 1.  Moderate degenerative change of the left glenohumeral joint.

2.  Suspected mild degenerative change at the left AC joint.
3.  Possible calcific tendonitis.

## 2014-01-05 ENCOUNTER — Encounter: Payer: Self-pay | Admitting: Internal Medicine

## 2014-01-21 DIAGNOSIS — Y9353 Activity, golf: Secondary | ICD-10-CM | POA: Diagnosis not present

## 2014-01-21 DIAGNOSIS — M5416 Radiculopathy, lumbar region: Secondary | ICD-10-CM | POA: Diagnosis not present

## 2014-01-21 DIAGNOSIS — M5136 Other intervertebral disc degeneration, lumbar region: Secondary | ICD-10-CM | POA: Diagnosis not present

## 2014-01-21 DIAGNOSIS — M545 Low back pain: Secondary | ICD-10-CM | POA: Diagnosis not present

## 2014-01-28 DIAGNOSIS — M5416 Radiculopathy, lumbar region: Secondary | ICD-10-CM | POA: Diagnosis not present

## 2014-01-28 DIAGNOSIS — M545 Low back pain: Secondary | ICD-10-CM | POA: Diagnosis not present

## 2014-01-28 DIAGNOSIS — M5136 Other intervertebral disc degeneration, lumbar region: Secondary | ICD-10-CM | POA: Diagnosis not present

## 2014-01-28 DIAGNOSIS — Y9353 Activity, golf: Secondary | ICD-10-CM | POA: Diagnosis not present

## 2014-01-30 DIAGNOSIS — M5416 Radiculopathy, lumbar region: Secondary | ICD-10-CM | POA: Diagnosis not present

## 2014-01-30 DIAGNOSIS — Y9353 Activity, golf: Secondary | ICD-10-CM | POA: Diagnosis not present

## 2014-01-30 DIAGNOSIS — M5136 Other intervertebral disc degeneration, lumbar region: Secondary | ICD-10-CM | POA: Diagnosis not present

## 2014-01-30 DIAGNOSIS — M545 Low back pain: Secondary | ICD-10-CM | POA: Diagnosis not present

## 2014-02-03 ENCOUNTER — Other Ambulatory Visit: Payer: Self-pay | Admitting: Internal Medicine

## 2014-02-03 DIAGNOSIS — L851 Acquired keratosis [keratoderma] palmaris et plantaris: Secondary | ICD-10-CM | POA: Diagnosis not present

## 2014-02-03 DIAGNOSIS — M2040 Other hammer toe(s) (acquired), unspecified foot: Secondary | ICD-10-CM | POA: Diagnosis not present

## 2014-02-03 DIAGNOSIS — M216X2 Other acquired deformities of left foot: Secondary | ICD-10-CM | POA: Diagnosis not present

## 2014-02-03 DIAGNOSIS — M79672 Pain in left foot: Secondary | ICD-10-CM | POA: Diagnosis not present

## 2014-02-07 DIAGNOSIS — H35352 Cystoid macular degeneration, left eye: Secondary | ICD-10-CM | POA: Diagnosis not present

## 2014-02-07 DIAGNOSIS — H34832 Tributary (branch) retinal vein occlusion, left eye: Secondary | ICD-10-CM | POA: Diagnosis not present

## 2014-02-10 ENCOUNTER — Other Ambulatory Visit: Payer: Self-pay | Admitting: Internal Medicine

## 2014-02-10 NOTE — Telephone Encounter (Signed)
09/02/13 

## 2014-02-10 NOTE — Telephone Encounter (Signed)
rx called into pharmacy

## 2014-02-10 NOTE — Telephone Encounter (Signed)
Approved:  #[id number] x 0 

## 2014-02-12 DIAGNOSIS — M216X2 Other acquired deformities of left foot: Secondary | ICD-10-CM | POA: Diagnosis not present

## 2014-02-12 DIAGNOSIS — M7662 Achilles tendinitis, left leg: Secondary | ICD-10-CM | POA: Diagnosis not present

## 2014-02-13 ENCOUNTER — Encounter: Payer: Self-pay | Admitting: Internal Medicine

## 2014-02-13 MED ORDER — FUROSEMIDE 40 MG PO TABS
40.0000 mg | ORAL_TABLET | Freq: Every day | ORAL | Status: DC | PRN
Start: 2014-02-13 — End: 2014-08-22

## 2014-02-17 ENCOUNTER — Other Ambulatory Visit: Payer: Self-pay | Admitting: Internal Medicine

## 2014-02-19 ENCOUNTER — Encounter: Payer: Self-pay | Admitting: Internal Medicine

## 2014-02-19 ENCOUNTER — Ambulatory Visit (INDEPENDENT_AMBULATORY_CARE_PROVIDER_SITE_OTHER): Payer: Medicare Other | Admitting: Internal Medicine

## 2014-02-19 VITALS — BP 150/80 | HR 66 | Wt 189.0 lb

## 2014-02-19 DIAGNOSIS — I70219 Atherosclerosis of native arteries of extremities with intermittent claudication, unspecified extremity: Secondary | ICD-10-CM | POA: Diagnosis not present

## 2014-02-19 DIAGNOSIS — H348192 Central retinal vein occlusion, unspecified eye, stable: Secondary | ICD-10-CM | POA: Insufficient documentation

## 2014-02-19 DIAGNOSIS — E785 Hyperlipidemia, unspecified: Secondary | ICD-10-CM

## 2014-02-19 DIAGNOSIS — H34812 Central retinal vein occlusion, left eye: Secondary | ICD-10-CM | POA: Diagnosis not present

## 2014-02-19 DIAGNOSIS — I872 Venous insufficiency (chronic) (peripheral): Secondary | ICD-10-CM

## 2014-02-19 DIAGNOSIS — I10 Essential (primary) hypertension: Secondary | ICD-10-CM

## 2014-02-19 MED ORDER — DOXAZOSIN MESYLATE 8 MG PO TABS: 8.0000 mg | ORAL_TABLET | Freq: Every day | ORAL | Status: DC

## 2014-02-19 MED ORDER — LOVASTATIN 40 MG PO TABS: 40.0000 mg | ORAL_TABLET | Freq: Every day | ORAL | Status: DC

## 2014-02-19 NOTE — Assessment & Plan Note (Addendum)
Discussed statin in view of RVO Will start low dose lovastatin

## 2014-02-19 NOTE — Progress Notes (Signed)
Subjective:    Patient ID: Stephen Garrett, male    DOB: 1923/04/10, 80 y.o.   MRN: [id number]  HPI Here for several concerns  Has had swelling in his legs Did take the furosemide 3 days in a row--- now clearly much better but the nurse notes an improvement  Picking at a sore spot on his cheek Just noticed it last night Irritated Worried about cancer  Now was diagnosed with retinal vein occlusion He has researched this--- knows he is supposed to eat healthier Wonders about cholesterol and his blood pressure Takes fish oil--but not necessarily for his cholesterol On ASA daily  Checked BP in drug store--- 190/90 at first Didn't recheck it but in general comes down to 140/80 if he sits a few minutes No headaches No chest pain  Current Outpatient Prescriptions on File Prior to Visit  Medication Sig Dispense Refill  . aspirin 81 MG tablet Take 81 mg by mouth every other day.     Marland Kitchen buPROPion (WELLBUTRIN XL) 150 MG 24 hr tablet TAKE ONE TABLET BY MOUTH EVERY DAY 90 tablet 3  . clonazePAM (KLONOPIN) 0.5 MG tablet TAKE 1 OR 2 TABLETS BY MOUTH AT BEDTIME 180 tablet 0  . doxazosin (CARDURA) 4 MG tablet TAKE ONE AND ONE-HALF TABLETS AT BEDTIME 135 tablet 3  . finasteride (PROSCAR) 5 MG tablet TAKE ONE TABLET BY MOUTH EVERY DAY 90 tablet 0  . fish oil-omega-3 fatty acids 1000 MG capsule Take 1 g by mouth every other day.     . furosemide (LASIX) 40 MG tablet Take 1 tablet (40 mg total) by mouth daily as needed. 30 tablet 0  . gabapentin (NEURONTIN) 300 MG capsule Take 300 mg by mouth at bedtime.     Marland Kitchen losartan-hydrochlorothiazide (HYZAAR) 100-25 MG per tablet TAKE ONE TABLET BY MOUTH EVERY DAY 90 tablet 3  . Multiple Vitamin (MULTIVITAMIN) capsule Take 1 capsule by mouth daily.      Marland Kitchen omeprazole (PRILOSEC) 20 MG capsule TAKE ONE CAPSULE BY MOUTH EVERY DAY 90 capsule 3  . pramipexole (MIRAPEX) 1 MG tablet TAKE 1 OR 2 TABLETS BY MOUTH AT BEDTIME FOR RESTLESS LEGS 180 tablet 0  .  [DISCONTINUED] citalopram (CELEXA) 10 MG tablet Take 1 tablet (10 mg total) by mouth daily. 30 tablet 5   No current facility-administered medications on file prior to visit.    Allergies  Allergen Reactions  . Atorvastatin     REACTION: raises blood pressure  . Pravastatin Sodium     REACTION: raises blood pressure  . Statins     REACTION: raises bp    Past Medical History  Diagnosis Date  . Diverticulosis of colon 06/2003  . GERD (gastroesophageal reflux disease)   . HLD (hyperlipidemia)   . HTN (hypertension)   . BPH (benign prostatic hypertrophy)   . Osteoarthritis of knee     severe, bilat  . Renal insufficiency   . Actinic keratosis   . Diseases of lips   . ED (erectile dysfunction)   . Restless legs syndrome (RLS)   . Sleep disturbance, unspecified   . Spinal stenosis, lumbar region, without neurogenic claudication     Past Surgical History  Procedure Laterality Date  . Finger surgery      Right 4th finger  . Tonsillectomy  childhood  . Lumbar spine surgery  06/2005    stenosis repair  . Total knee arthroplasty  06/2008    bilat (Dr. Marry Guan)  . Cataract extraction, bilateral  2011  Dr. Thomasene Ripple  . Lih  10/2009    Dr. Bary Castilla    Family History  Problem Relation Age of Onset  . Other Father     "clogged arteries"  . Hypertension Mother     History   Social History  . Marital Status: Widowed    Spouse Name: N/A    Number of Children: 2  . Years of Education: N/A   Occupational History  . Retired    Social History Main Topics  . Smoking status: Former Smoker    Quit date: 02/28/1970  . Smokeless tobacco: Never Used  . Alcohol Use: Yes     Comment: 1-2 glasses wine/daily  . Drug Use: No  . Sexual Activity: Not on file   Other Topics Concern  . Not on file   Social History Narrative   Widowed 2010   2 adopted children   Has living will   Son is health care power of attorney   Would accept resuscitation but no prolonged ventilation    No tube feeds if cognitively unaware   Review of Systems  Sleeps great on the gabapentin Appetite is great     Objective:   Physical Exam  Constitutional: He appears well-developed and well-nourished. No distress.  Neck: Normal range of motion. Neck supple. No thyromegaly present.  Cardiovascular: Normal rate, regular rhythm and normal heart sounds.  Exam reveals no gallop.   No murmur heard. Pulmonary/Chest: Effort normal and breath sounds normal. No respiratory distress. He has no wheezes. He has no rales.  Musculoskeletal:  1+ non pitting edema in left calf  Lymphadenopathy:    He has no cervical adenopathy.  Skin:  Non inflamed superficial skin tear in left calf  Slight skin irritation at left chin--not worrisome (like scraped area)  Psychiatric: He has a normal mood and affect. His behavior is normal.          Assessment & Plan:

## 2014-02-19 NOTE — Progress Notes (Signed)
Pre visit review using our clinic review tool, if applicable. No additional management support is needed unless otherwise documented below in the visit note. 

## 2014-02-19 NOTE — Assessment & Plan Note (Signed)
Discussed support socks Furosemide up to 3 days per week

## 2014-02-19 NOTE — Assessment & Plan Note (Signed)
BP Readings from Last 3 Encounters:  02/19/14 150/80  12/20/13 144/90  11/22/13 140/80   Repeat on right 148/90---but has sounds over that that sound like pseudohypertension Vague orthostatic dizziness so don't want to be too aggressive Will just increase the doxazosin to 8mg 

## 2014-02-19 NOTE — Assessment & Plan Note (Signed)
New finding Discussed optimizing BP and cholesterol Rx Will make changes Continues with ophtho

## 2014-02-24 ENCOUNTER — Encounter: Payer: Self-pay | Admitting: Internal Medicine

## 2014-03-10 DIAGNOSIS — H35352 Cystoid macular degeneration, left eye: Secondary | ICD-10-CM | POA: Diagnosis not present

## 2014-03-10 DIAGNOSIS — H34832 Tributary (branch) retinal vein occlusion, left eye: Secondary | ICD-10-CM | POA: Diagnosis not present

## 2014-03-19 DIAGNOSIS — D2261 Melanocytic nevi of right upper limb, including shoulder: Secondary | ICD-10-CM | POA: Diagnosis not present

## 2014-03-19 DIAGNOSIS — L570 Actinic keratosis: Secondary | ICD-10-CM | POA: Diagnosis not present

## 2014-03-19 DIAGNOSIS — Z85828 Personal history of other malignant neoplasm of skin: Secondary | ICD-10-CM | POA: Diagnosis not present

## 2014-03-19 DIAGNOSIS — D2262 Melanocytic nevi of left upper limb, including shoulder: Secondary | ICD-10-CM | POA: Diagnosis not present

## 2014-03-19 DIAGNOSIS — Z85820 Personal history of malignant melanoma of skin: Secondary | ICD-10-CM | POA: Diagnosis not present

## 2014-03-27 DIAGNOSIS — M5136 Other intervertebral disc degeneration, lumbar region: Secondary | ICD-10-CM | POA: Diagnosis not present

## 2014-03-31 ENCOUNTER — Other Ambulatory Visit: Payer: Self-pay | Admitting: Internal Medicine

## 2014-04-09 ENCOUNTER — Encounter: Payer: Self-pay | Admitting: Internal Medicine

## 2014-04-22 ENCOUNTER — Ambulatory Visit (INDEPENDENT_AMBULATORY_CARE_PROVIDER_SITE_OTHER): Payer: Medicare Other | Admitting: Internal Medicine

## 2014-04-22 ENCOUNTER — Encounter: Payer: Self-pay | Admitting: Internal Medicine

## 2014-04-22 VITALS — BP 132/80 | HR 76 | Temp 97.8°F | Wt 192.2 lb

## 2014-04-22 DIAGNOSIS — N183 Chronic kidney disease, stage 3 unspecified: Secondary | ICD-10-CM

## 2014-04-22 DIAGNOSIS — I872 Venous insufficiency (chronic) (peripheral): Secondary | ICD-10-CM | POA: Diagnosis not present

## 2014-04-22 DIAGNOSIS — E785 Hyperlipidemia, unspecified: Secondary | ICD-10-CM

## 2014-04-22 DIAGNOSIS — I10 Essential (primary) hypertension: Secondary | ICD-10-CM | POA: Diagnosis not present

## 2014-04-22 DIAGNOSIS — R413 Other amnesia: Secondary | ICD-10-CM | POA: Diagnosis not present

## 2014-04-22 LAB — COMPREHENSIVE METABOLIC PANEL
ALT: 17 U/L (ref 0–53)
AST: 22 U/L (ref 0–37)
Albumin: 3.7 g/dL (ref 3.5–5.2)
Alkaline Phosphatase: 63 U/L (ref 39–117)
BUN: 28 mg/dL — ABNORMAL HIGH (ref 6–23)
CO2: 30 mEq/L (ref 19–32)
Calcium: 9 mg/dL (ref 8.4–10.5)
Chloride: 104 mEq/L (ref 96–112)
Creatinine, Ser: 1.52 mg/dL — ABNORMAL HIGH (ref 0.40–1.50)
GFR: 46.16 mL/min — ABNORMAL LOW (ref >60.00)
Glucose, Bld: 114 mg/dL — ABNORMAL HIGH (ref 70–99)
Potassium: 3.3 mEq/L — ABNORMAL LOW (ref 3.5–5.1)
Sodium: 139 mEq/L (ref 135–145)
Total Bilirubin: 0.8 mg/dL (ref 0.2–1.2)
Total Protein: 6.1 g/dL (ref 6.0–8.3)

## 2014-04-22 LAB — LIPID PANEL
Cholesterol: 193 mg/dL (ref 0–200)
HDL: 38.2 mg/dL — ABNORMAL LOW (ref >39.00)
LDL Cholesterol: 135 mg/dL — ABNORMAL HIGH (ref 0–99)
NonHDL: 154.8
Total CHOL/HDL Ratio: 5
Triglycerides: 98 mg/dL (ref 0.0–149.0)
VLDL: 19.6 mg/dL (ref 0.0–40.0)

## 2014-04-22 LAB — VITAMIN B12: Vitamin B-12: 594 pg/mL (ref 211–911)

## 2014-04-22 NOTE — Assessment & Plan Note (Signed)
Fair control with intermittent furosemide No evidence of CHF

## 2014-04-22 NOTE — Assessment & Plan Note (Signed)
Not that significant and his ladyfriend is not concerned ??early vascular changes Discussed MRI---will hold off Check B12

## 2014-04-22 NOTE — Progress Notes (Signed)
Pre visit review using our clinic review tool, if applicable. No additional management support is needed unless otherwise documented below in the visit note. 

## 2014-04-22 NOTE — Assessment & Plan Note (Signed)
Will recheck with the furosemide

## 2014-04-22 NOTE — Assessment & Plan Note (Signed)
Feels better off the lovastatin If he has vascular changes in brain, it might make sense to try another---but I am wary at his age If he has further memory loss--would check MRI and then reconsider

## 2014-04-22 NOTE — Progress Notes (Signed)
Subjective:    Patient ID: Stephen Garrett, male    DOB: [birth year], 81 y.o.   MRN: [id number]  HPI "I feel fine but I have 4 issues"  Had scratch on lateral right 5th finger--hit it on pencil in car This happened a week ago Has scabbed No pain  Worried about his throat Voice sounds different to people and him No illness Not painful  Still with foot swelling Uses the furosemide intermittently --usually 3 days per week Does get better at night No SOB  Worried about his memory Has forgotten sequence of showering--then forgot to dry legs and underwear got wet Trouble bringing up words Still handles finances, hasn't gotten lost in car  Current Outpatient Prescriptions on File Prior to Visit  Medication Sig Dispense Refill  . aspirin 81 MG tablet Take 81 mg by mouth every other day.     Marland Kitchen buPROPion (WELLBUTRIN XL) 150 MG 24 hr tablet TAKE ONE TABLET BY MOUTH EVERY DAY 90 tablet 3  . clonazePAM (KLONOPIN) 0.5 MG tablet TAKE 1 OR 2 TABLETS BY MOUTH AT BEDTIME 180 tablet 0  . doxazosin (CARDURA) 8 MG tablet Take 1 tablet (8 mg total) by mouth daily. 90 tablet 3  . finasteride (PROSCAR) 5 MG tablet TAKE ONE TABLET BY MOUTH EVERY DAY 90 tablet 0  . fish oil-omega-3 fatty acids 1000 MG capsule Take 1 g by mouth every other day.     . furosemide (LASIX) 40 MG tablet Take 1 tablet (40 mg total) by mouth daily as needed. 30 tablet 0  . gabapentin (NEURONTIN) 300 MG capsule Take 300 mg by mouth at bedtime.     Marland Kitchen losartan-hydrochlorothiazide (HYZAAR) 100-25 MG per tablet TAKE ONE TABLET BY MOUTH EVERY DAY 90 tablet 3  . lovastatin (MEVACOR) 40 MG tablet Take 1 tablet (40 mg total) by mouth at bedtime. (Patient not taking: Reported on 04/22/2014) 90 tablet 3  . Multiple Vitamin (MULTIVITAMIN) capsule Take 1 capsule by mouth daily.      Marland Kitchen omeprazole (PRILOSEC) 20 MG capsule TAKE ONE CAPSULE BY MOUTH EVERY DAY 90 capsule 3  . pramipexole (MIRAPEX) 1 MG tablet TAKE 1 OR 2 TABLETS BY MOUTH AT  BEDTIME FOR RESTLESS LEGS 180 tablet 0  . [DISCONTINUED] citalopram (CELEXA) 10 MG tablet Take 1 tablet (10 mg total) by mouth daily. 30 tablet 5   No current facility-administered medications on file prior to visit.    Allergies  Allergen Reactions  . Atorvastatin     REACTION: raises blood pressure  . Pravastatin Sodium     REACTION: raises blood pressure  . Statins     REACTION: raises bp    Past Medical History  Diagnosis Date  . Diverticulosis of colon 06/2003  . GERD (gastroesophageal reflux disease)   . HLD (hyperlipidemia)   . HTN (hypertension)   . BPH (benign prostatic hypertrophy)   . Osteoarthritis of knee     severe, bilat  . Renal insufficiency   . Actinic keratosis   . Diseases of lips   . ED (erectile dysfunction)   . Restless legs syndrome (RLS)   . Sleep disturbance, unspecified   . Spinal stenosis, lumbar region, without neurogenic claudication     Past Surgical History  Procedure Laterality Date  . Finger surgery      Right 4th finger  . Tonsillectomy  childhood  . Lumbar spine surgery  06/2005    stenosis repair  . Total knee arthroplasty  06/2008  bilat (Dr. Marry Guan)  . Cataract extraction, bilateral  2011    Dr. Thomasene Ripple  . Lih  10/2009    Dr. Bary Castilla    Family History  Problem Relation Age of Onset  . Other Father     "clogged arteries"  . Hypertension Mother     History   Social History  . Marital Status: Widowed    Spouse Name: N/A  . Number of Children: 2  . Years of Education: N/A   Occupational History  . Retired    Social History Main Topics  . Smoking status: Former Smoker    Quit date: 02/28/1970  . Smokeless tobacco: Never Used  . Alcohol Use: 0.0 oz/week    0 Standard drinks or equivalent per week     Comment: 1-2 glasses wine/daily  . Drug Use: No  . Sexual Activity: Not on file   Other Topics Concern  . Not on file   Social History Narrative   Widowed 2010   2 adopted children   Has living will    Son is health care power of attorney   Would accept resuscitation but no prolonged ventilation   No tube feeds if cognitively unaware   Review of Systems Sleeps well No mood problems Appetite is great    Objective:   Physical Exam  Constitutional: He is oriented to person, place, and time. He appears well-developed and well-nourished. No distress.  HENT:  Mouth/Throat: Oropharynx is clear and moist. No oropharyngeal exudate.  Neck: Normal range of motion. Neck supple. No thyromegaly present.  Cardiovascular: Normal rate, regular rhythm, normal heart sounds and intact distal pulses.  Exam reveals no gallop.   soft systolic murmur  Pulmonary/Chest: Effort normal and breath sounds normal. No respiratory distress. He has no wheezes. He has no rales.  Musculoskeletal:  1+ edema in left calf, trace in right  Lymphadenopathy:    He has no cervical adenopathy.  Neurological: He is alert and oriented to person, place, and time.  President-- "Obama, Anne Hahn--- "  Didn't get [phone] D-l-r-o-w Recall 2/3  Skin:  Open area on lateral right hand is not infected--some granulation  Psychiatric: He has a normal mood and affect. His behavior is normal.          Assessment & Plan:

## 2014-04-22 NOTE — Assessment & Plan Note (Signed)
BP Readings from Last 3 Encounters:  04/22/14 132/80  02/19/14 150/80  12/20/13 144/90   Better control

## 2014-04-30 DIAGNOSIS — H34832 Tributary (branch) retinal vein occlusion, left eye: Secondary | ICD-10-CM | POA: Diagnosis not present

## 2014-05-05 ENCOUNTER — Other Ambulatory Visit: Payer: Self-pay | Admitting: Internal Medicine

## 2014-05-16 DIAGNOSIS — S0591XA Unspecified injury of right eye and orbit, initial encounter: Secondary | ICD-10-CM | POA: Diagnosis not present

## 2014-06-04 DIAGNOSIS — H34832 Tributary (branch) retinal vein occlusion, left eye: Secondary | ICD-10-CM | POA: Diagnosis not present

## 2014-06-11 DIAGNOSIS — Z85828 Personal history of other malignant neoplasm of skin: Secondary | ICD-10-CM | POA: Diagnosis not present

## 2014-06-11 DIAGNOSIS — L570 Actinic keratosis: Secondary | ICD-10-CM | POA: Diagnosis not present

## 2014-06-11 DIAGNOSIS — Z85820 Personal history of malignant melanoma of skin: Secondary | ICD-10-CM | POA: Diagnosis not present

## 2014-06-11 DIAGNOSIS — X32XXXA Exposure to sunlight, initial encounter: Secondary | ICD-10-CM | POA: Diagnosis not present

## 2014-06-11 DIAGNOSIS — D225 Melanocytic nevi of trunk: Secondary | ICD-10-CM | POA: Diagnosis not present

## 2014-06-11 DIAGNOSIS — L821 Other seborrheic keratosis: Secondary | ICD-10-CM | POA: Diagnosis not present

## 2014-06-24 ENCOUNTER — Encounter: Payer: Self-pay | Admitting: Internal Medicine

## 2014-06-30 ENCOUNTER — Other Ambulatory Visit: Payer: Self-pay | Admitting: Internal Medicine

## 2014-07-04 DIAGNOSIS — H34832 Tributary (branch) retinal vein occlusion, left eye: Secondary | ICD-10-CM | POA: Diagnosis not present

## 2014-07-29 ENCOUNTER — Other Ambulatory Visit: Payer: Self-pay | Admitting: Internal Medicine

## 2014-07-29 NOTE — Telephone Encounter (Signed)
02/10/14 

## 2014-07-29 NOTE — Telephone Encounter (Signed)
rx called into pharmacy

## 2014-07-29 NOTE — Telephone Encounter (Signed)
Approved: #[id number] x 0 He should set up Medicare wellness for about 5-6 months

## 2014-07-30 DIAGNOSIS — H905 Unspecified sensorineural hearing loss: Secondary | ICD-10-CM | POA: Diagnosis not present

## 2014-07-30 DIAGNOSIS — H903 Sensorineural hearing loss, bilateral: Secondary | ICD-10-CM | POA: Diagnosis not present

## 2014-08-01 DIAGNOSIS — H34832 Tributary (branch) retinal vein occlusion, left eye: Secondary | ICD-10-CM | POA: Diagnosis not present

## 2014-08-05 DIAGNOSIS — D485 Neoplasm of uncertain behavior of skin: Secondary | ICD-10-CM | POA: Diagnosis not present

## 2014-08-05 DIAGNOSIS — C44319 Basal cell carcinoma of skin of other parts of face: Secondary | ICD-10-CM | POA: Diagnosis not present

## 2014-08-22 ENCOUNTER — Ambulatory Visit (INDEPENDENT_AMBULATORY_CARE_PROVIDER_SITE_OTHER): Payer: Medicare Other | Admitting: Internal Medicine

## 2014-08-22 ENCOUNTER — Encounter: Payer: Self-pay | Admitting: Internal Medicine

## 2014-08-22 VITALS — BP 160/80 | HR 67 | Wt 191.0 lb

## 2014-08-22 DIAGNOSIS — R490 Dysphonia: Secondary | ICD-10-CM

## 2014-08-22 DIAGNOSIS — M4806 Spinal stenosis, lumbar region: Secondary | ICD-10-CM

## 2014-08-22 DIAGNOSIS — M48061 Spinal stenosis, lumbar region without neurogenic claudication: Secondary | ICD-10-CM

## 2014-08-22 DIAGNOSIS — R5383 Other fatigue: Secondary | ICD-10-CM | POA: Diagnosis not present

## 2014-08-22 DIAGNOSIS — F39 Unspecified mood [affective] disorder: Secondary | ICD-10-CM

## 2014-08-22 DIAGNOSIS — G2581 Restless legs syndrome: Secondary | ICD-10-CM

## 2014-08-22 NOTE — Patient Instructions (Signed)
Please stop the mirapex

## 2014-08-22 NOTE — Assessment & Plan Note (Signed)
Seems better  Will try off the mirapex

## 2014-08-22 NOTE — Assessment & Plan Note (Signed)
Not clear cut No change since last visit Will proceed with ENT eval if worsens

## 2014-08-22 NOTE — Assessment & Plan Note (Signed)
Doesn't seem to be having sig pain from this so will stay off the gabapentin

## 2014-08-22 NOTE — Assessment & Plan Note (Signed)
Remains anxious--especially about his health Will continue the medication for now

## 2014-08-22 NOTE — Progress Notes (Signed)
Subjective:    Patient ID: Stephen Garrett, male    DOB: Mar 08, 1923, 81 y.o.   MRN: [id number]  HPI Here for 4 issues-- but mostly concerned about fatigue  Only can play 1/2 hour of doubles tennis before he has to stop---could do 2 hours before He feels this may have worsened after giving up the gabapentin But not having leg pain --just some tingling No chest pain No DOE  Concerned about his voice again Seems about the same as last time Very little AM cough No sig heartburn  Lip lesion for 2 days ?infection May have been burned by some hot tea  Worried about carotid arteries--wants to get checked No weakness, aphasia, dysphagia, facial droop, etc Feels his memory hasn't changed at all  Current Outpatient Prescriptions on File Prior to Visit  Medication Sig Dispense Refill  . aspirin 81 MG tablet Take 81 mg by mouth every other day.     Marland Kitchen buPROPion (WELLBUTRIN XL) 150 MG 24 hr tablet TAKE ONE TABLET BY MOUTH EVERY DAY 90 tablet 3  . clonazePAM (KLONOPIN) 0.5 MG tablet TAKE 1 OR 2 TABLETS BY MOUTH AT BEDTIME 120 tablet 0  . doxazosin (CARDURA) 8 MG tablet Take 1 tablet (8 mg total) by mouth daily. 90 tablet 3  . finasteride (PROSCAR) 5 MG tablet TAKE ONE TABLET BY MOUTH EVERY DAY 90 tablet 1  . fish oil-omega-3 fatty acids 1000 MG capsule Take 1 g by mouth every other day.     . losartan-hydrochlorothiazide (HYZAAR) 100-25 MG per tablet TAKE ONE TABLET BY MOUTH EVERY DAY 90 tablet 3  . Multiple Vitamin (MULTIVITAMIN) capsule Take 1 capsule by mouth daily.      Marland Kitchen omeprazole (PRILOSEC) 20 MG capsule TAKE ONE CAPSULE BY MOUTH EVERY DAY 90 capsule 3  . pramipexole (MIRAPEX) 1 MG tablet TAKE 1 OR 2 TABLETS BY MOUTH AT BEDTIME FOR RESTLESS LEGS 180 tablet 0  . [DISCONTINUED] citalopram (CELEXA) 10 MG tablet Take 1 tablet (10 mg total) by mouth daily. 30 tablet 5   No current facility-administered medications on file prior to visit.    Allergies  Allergen Reactions  .  Atorvastatin     REACTION: raises blood pressure  . Pravastatin Sodium     REACTION: raises blood pressure  . Statins     REACTION: raises bp    Past Medical History  Diagnosis Date  . Diverticulosis of colon 06/2003  . GERD (gastroesophageal reflux disease)   . HLD (hyperlipidemia)   . HTN (hypertension)   . BPH (benign prostatic hypertrophy)   . Osteoarthritis of knee     severe, bilat  . Renal insufficiency   . Actinic keratosis   . Diseases of lips   . ED (erectile dysfunction)   . Restless legs syndrome (RLS)   . Sleep disturbance, unspecified   . Spinal stenosis, lumbar region, without neurogenic claudication     Past Surgical History  Procedure Laterality Date  . Finger surgery      Right 4th finger  . Tonsillectomy  childhood  . Lumbar spine surgery  06/2005    stenosis repair  . Total knee arthroplasty  06/2008    bilat (Dr. Marry Guan)  . Cataract extraction, bilateral  2011    Dr. Thomasene Ripple  . Lih  10/2009    Dr. Bary Castilla    Family History  Problem Relation Age of Onset  . Other Father     "clogged arteries"  . Hypertension Mother  History   Social History  . Marital Status: Widowed    Spouse Name: N/A  . Number of Children: 2  . Years of Education: N/A   Occupational History  . Retired    Social History Main Topics  . Smoking status: Former Smoker    Quit date: 02/28/1970  . Smokeless tobacco: Never Used  . Alcohol Use: 0.0 oz/week    0 Standard drinks or equivalent per week     Comment: 1-2 glasses wine/daily  . Drug Use: No  . Sexual Activity: Not on file   Other Topics Concern  . Not on file   Social History Narrative   Widowed 2010   2 adopted children   Has living will   Son is health care power of attorney   Would accept resuscitation but no prolonged ventilation   No tube feeds if cognitively unaware   Review of Systems  Not having the restless legs in some time Appetite is great     Objective:   Physical Exam    Constitutional: He appears well-developed and well-nourished. No distress.  HENT:  Small granulated lesion on mid lower lip  Neck: Normal range of motion. Neck supple.  No carotid bruits  Cardiovascular: Normal rate, regular rhythm and normal heart sounds.  Exam reveals no gallop.   No murmur heard. Pulmonary/Chest: Effort normal and breath sounds normal. No respiratory distress. He has no wheezes. He has no rales.  Musculoskeletal: He exhibits no edema or tenderness.  Lymphadenopathy:    He has no cervical adenopathy.  Psychiatric:  Usual anxiety          Assessment & Plan:

## 2014-08-22 NOTE — Progress Notes (Signed)
Pre visit review using our clinic review tool, if applicable. No additional management support is needed unless otherwise documented below in the visit note. 

## 2014-08-22 NOTE — Assessment & Plan Note (Signed)
Noticed after stopping gabapentin--which was sedating him though No signs of coronary ischemia He is typical worried well--- hard to tell age related from pathology Will try off mirapex and see if that helps

## 2014-08-23 ENCOUNTER — Encounter: Payer: Self-pay | Admitting: Internal Medicine

## 2014-08-23 DIAGNOSIS — R29898 Other symptoms and signs involving the musculoskeletal system: Secondary | ICD-10-CM

## 2014-08-28 DIAGNOSIS — R5383 Other fatigue: Secondary | ICD-10-CM | POA: Diagnosis not present

## 2014-08-28 DIAGNOSIS — M6281 Muscle weakness (generalized): Secondary | ICD-10-CM | POA: Diagnosis not present

## 2014-08-29 ENCOUNTER — Encounter: Payer: Self-pay | Admitting: Internal Medicine

## 2014-08-29 DIAGNOSIS — H34832 Tributary (branch) retinal vein occlusion, left eye: Secondary | ICD-10-CM | POA: Diagnosis not present

## 2014-08-29 DIAGNOSIS — H35371 Puckering of macula, right eye: Secondary | ICD-10-CM | POA: Diagnosis not present

## 2014-09-03 DIAGNOSIS — C44319 Basal cell carcinoma of skin of other parts of face: Secondary | ICD-10-CM | POA: Diagnosis not present

## 2014-09-03 DIAGNOSIS — L905 Scar conditions and fibrosis of skin: Secondary | ICD-10-CM | POA: Diagnosis not present

## 2014-09-08 ENCOUNTER — Other Ambulatory Visit: Payer: Self-pay | Admitting: Internal Medicine

## 2014-09-08 DIAGNOSIS — R5383 Other fatigue: Secondary | ICD-10-CM | POA: Diagnosis not present

## 2014-09-08 DIAGNOSIS — M6281 Muscle weakness (generalized): Secondary | ICD-10-CM | POA: Diagnosis not present

## 2014-09-09 DIAGNOSIS — Z45321 Encounter for adjustment and management of cochlear device: Secondary | ICD-10-CM | POA: Diagnosis not present

## 2014-09-09 DIAGNOSIS — H903 Sensorineural hearing loss, bilateral: Secondary | ICD-10-CM | POA: Diagnosis not present

## 2014-09-09 DIAGNOSIS — H905 Unspecified sensorineural hearing loss: Secondary | ICD-10-CM | POA: Diagnosis not present

## 2014-09-22 ENCOUNTER — Other Ambulatory Visit: Payer: Self-pay | Admitting: Internal Medicine

## 2014-09-22 ENCOUNTER — Encounter: Payer: Self-pay | Admitting: Internal Medicine

## 2014-09-25 ENCOUNTER — Encounter: Payer: Self-pay | Admitting: Internal Medicine

## 2014-10-03 DIAGNOSIS — H34832 Tributary (branch) retinal vein occlusion, left eye: Secondary | ICD-10-CM | POA: Diagnosis not present

## 2014-10-10 DIAGNOSIS — C44311 Basal cell carcinoma of skin of nose: Secondary | ICD-10-CM | POA: Diagnosis not present

## 2014-10-10 DIAGNOSIS — D485 Neoplasm of uncertain behavior of skin: Secondary | ICD-10-CM | POA: Diagnosis not present

## 2014-10-22 ENCOUNTER — Encounter: Payer: Self-pay | Admitting: Internal Medicine

## 2014-10-27 ENCOUNTER — Other Ambulatory Visit: Payer: Self-pay | Admitting: Internal Medicine

## 2014-10-29 ENCOUNTER — Other Ambulatory Visit: Payer: Self-pay | Admitting: *Deleted

## 2014-10-29 ENCOUNTER — Telehealth: Payer: Self-pay

## 2014-10-29 MED ORDER — PRAMIPEXOLE DIHYDROCHLORIDE 1 MG PO TABS: 1.0000 mg | ORAL_TABLET | Freq: Every evening | ORAL | Status: DC | PRN

## 2014-10-29 NOTE — Telephone Encounter (Signed)
Pt left v/m requesting refill mirapex # 180 instead of #60. On 06/*24/16 office note Dr Silvio Pate wanted pt to try to stop mirapex. Dee CMA spoke with Dr Silvio Pate and it is OK for pt to take mirapex and Karena Addison will change refill quantity to # 180. Left detailed message to pt that refill changed for mirapex to # 180 per DPR.

## 2014-10-31 DIAGNOSIS — D485 Neoplasm of uncertain behavior of skin: Secondary | ICD-10-CM | POA: Diagnosis not present

## 2014-10-31 DIAGNOSIS — D224 Melanocytic nevi of scalp and neck: Secondary | ICD-10-CM | POA: Diagnosis not present

## 2014-10-31 DIAGNOSIS — Z85828 Personal history of other malignant neoplasm of skin: Secondary | ICD-10-CM | POA: Diagnosis not present

## 2014-10-31 DIAGNOSIS — Z85820 Personal history of malignant melanoma of skin: Secondary | ICD-10-CM | POA: Diagnosis not present

## 2014-11-12 DIAGNOSIS — H34832 Tributary (branch) retinal vein occlusion, left eye: Secondary | ICD-10-CM | POA: Diagnosis not present

## 2014-11-14 ENCOUNTER — Ambulatory Visit (INDEPENDENT_AMBULATORY_CARE_PROVIDER_SITE_OTHER): Payer: Medicare Other | Admitting: Internal Medicine

## 2014-11-14 ENCOUNTER — Encounter: Payer: Self-pay | Admitting: Internal Medicine

## 2014-11-14 VITALS — BP 118/62 | HR 78 | Temp 98.1°F | Wt 191.0 lb

## 2014-11-14 DIAGNOSIS — R29898 Other symptoms and signs involving the musculoskeletal system: Secondary | ICD-10-CM

## 2014-11-14 DIAGNOSIS — I10 Essential (primary) hypertension: Secondary | ICD-10-CM | POA: Diagnosis not present

## 2014-11-14 LAB — CBC WITH DIFFERENTIAL/PLATELET
Basophils Absolute: 0 10*3/uL (ref 0.0–0.1)
Basophils Relative: 0.4 % (ref 0.0–3.0)
Eosinophils Absolute: 0 10*3/uL (ref 0.0–0.7)
Eosinophils Relative: 0.2 % (ref 0.0–5.0)
HCT: 38.3 % — ABNORMAL LOW (ref 39.0–52.0)
Hemoglobin: 12.8 g/dL — ABNORMAL LOW (ref 13.0–17.0)
Lymphocytes Relative: 11.4 % — ABNORMAL LOW (ref 12.0–46.0)
Lymphs Abs: 1.3 10*3/uL (ref 0.7–4.0)
MCHC: 33.4 g/dL (ref 30.0–36.0)
MCV: 91.3 fl (ref 78.0–100.0)
Monocytes Absolute: 0.5 10*3/uL (ref 0.1–1.0)
Monocytes Relative: 4 % (ref 3.0–12.0)
Neutro Abs: 9.9 10*3/uL — ABNORMAL HIGH (ref 1.4–7.7)
Neutrophils Relative %: 84 % — ABNORMAL HIGH (ref 43.0–77.0)
Platelets: 182 10*3/uL (ref 150.0–400.0)
RBC: 4.19 Mil/uL — ABNORMAL LOW (ref 4.22–5.81)
RDW: 14 % (ref 11.5–15.5)
WBC: 11.7 10*3/uL — ABNORMAL HIGH (ref 4.0–10.5)

## 2014-11-14 LAB — COMPREHENSIVE METABOLIC PANEL
ALT: 19 U/L (ref 0–53)
AST: 21 U/L (ref 0–37)
Albumin: 3.9 g/dL (ref 3.5–5.2)
Alkaline Phosphatase: 71 U/L (ref 39–117)
BUN: 31 mg/dL — ABNORMAL HIGH (ref 6–23)
CO2: 30 mEq/L (ref 19–32)
Calcium: 9.3 mg/dL (ref 8.4–10.5)
Chloride: 100 mEq/L (ref 96–112)
Creatinine, Ser: 1.72 mg/dL — ABNORMAL HIGH (ref 0.40–1.50)
GFR: 39.97 mL/min — ABNORMAL LOW (ref >60.00)
Glucose, Bld: 102 mg/dL — ABNORMAL HIGH (ref 70–99)
Potassium: 3.5 mEq/L (ref 3.5–5.1)
Sodium: 136 mEq/L (ref 135–145)
Total Bilirubin: 0.8 mg/dL (ref 0.2–1.2)
Total Protein: 6.4 g/dL (ref 6.0–8.3)

## 2014-11-14 LAB — T4, FREE: Free T4: 0.87 ng/dL (ref 0.60–1.60)

## 2014-11-14 NOTE — Assessment & Plan Note (Signed)
Only new thing was an eye injection several days ago No focal neuro findings--but face feels "funny" PE unremarkable Will recheck labs to be sure not anemic or change in renal fucniton No signs of infection Has appt coming up soon---will reevaluate then

## 2014-11-14 NOTE — Progress Notes (Signed)
Pre visit review using our clinic review tool, if applicable. No additional management support is needed unless otherwise documented below in the visit note. 

## 2014-11-14 NOTE — Patient Instructions (Signed)
Please try cutting the doxazosin in half and only take 4mg  daily.

## 2014-11-14 NOTE — Assessment & Plan Note (Signed)
BP Readings from Last 3 Encounters:  11/14/14 118/62  08/22/14 160/80  04/22/14 132/80   This is lower than usual--but unlikely to be the reason for his leg weakness Will try cutting the doxazosin

## 2014-11-14 NOTE — Progress Notes (Signed)
Subjective:    Patient ID: Stephen Garrett, male    DOB: Sep 28, 1923, 81 y.o.   MRN: [id number]  HPI Here due to leg weakness  Awoke this AM and could barely get out of bed Legs were so weak Wondered about being dehydrated Some better after pushing fluids but still not normal  Also had pain in "small of back" this AM This has resolved  Felt fine yesterday Last played tennis 3 days ago-- 90 minutes  Slight headache No dizziness  No chest pain No SOB Some cough-- quite a bit at first in AM, gone now No fever  No new meds No sleep aides  Current Outpatient Prescriptions on File Prior to Visit  Medication Sig Dispense Refill  . aspirin 81 MG tablet Take 81 mg by mouth every other day.     Marland Kitchen buPROPion (WELLBUTRIN XL) 150 MG 24 hr tablet TAKE ONE TABLET BY MOUTH EVERY DAY 90 tablet 3  . clonazePAM (KLONOPIN) 0.5 MG tablet TAKE 1 OR 2 TABLETS BY MOUTH AT BEDTIME 120 tablet 0  . doxazosin (CARDURA) 8 MG tablet Take 1 tablet (8 mg total) by mouth daily. 90 tablet 3  . finasteride (PROSCAR) 5 MG tablet TAKE ONE TABLET BY MOUTH EVERY DAY 90 tablet 3  . fish oil-omega-3 fatty acids 1000 MG capsule Take 1 g by mouth every other day.     . losartan-hydrochlorothiazide (HYZAAR) 100-25 MG per tablet TAKE ONE TABLET BY MOUTH EVERY DAY 90 tablet 3  . Multiple Vitamin (MULTIVITAMIN) capsule Take 1 capsule by mouth daily.      Marland Kitchen omeprazole (PRILOSEC) 20 MG capsule TAKE ONE CAPSULE BY MOUTH EVERY DAY 90 capsule 3  . pramipexole (MIRAPEX) 1 MG tablet Take 1-2 tablets (1-2 mg total) by mouth at bedtime as needed. 180 tablet 0  . [DISCONTINUED] citalopram (CELEXA) 10 MG tablet Take 1 tablet (10 mg total) by mouth daily. 30 tablet 5   No current facility-administered medications on file prior to visit.    Allergies  Allergen Reactions  . Atorvastatin     REACTION: raises blood pressure  . Pravastatin Sodium     REACTION: raises blood pressure  . Statins     REACTION: raises bp    Past  Medical History  Diagnosis Date  . Diverticulosis of colon 06/2003  . GERD (gastroesophageal reflux disease)   . HLD (hyperlipidemia)   . HTN (hypertension)   . BPH (benign prostatic hypertrophy)   . Osteoarthritis of knee     severe, bilat  . Renal insufficiency   . Actinic keratosis   . Diseases of lips   . ED (erectile dysfunction)   . Restless legs syndrome (RLS)   . Sleep disturbance, unspecified   . Spinal stenosis, lumbar region, without neurogenic claudication     Past Surgical History  Procedure Laterality Date  . Finger surgery      Right 4th finger  . Tonsillectomy  childhood  . Lumbar spine surgery  06/2005    stenosis repair  . Total knee arthroplasty  06/2008    bilat (Dr. Marry Guan)  . Cataract extraction, bilateral  2011    Dr. Thomasene Ripple  . Lih  10/2009    Dr. Bary Castilla    Family History  Problem Relation Age of Onset  . Other Father     "clogged arteries"  . Hypertension Mother     Social History   Social History  . Marital Status: Widowed    Spouse Name: N/A  .  Number of Children: 2  . Years of Education: N/A   Occupational History  . Retired    Social History Main Topics  . Smoking status: Former Smoker    Quit date: 02/28/1970  . Smokeless tobacco: Never Used  . Alcohol Use: 0.0 oz/week    0 Standard drinks or equivalent per week     Comment: 1-2 glasses wine/daily  . Drug Use: No  . Sexual Activity: Not on file   Other Topics Concern  . Not on file   Social History Narrative   Widowed 2010   2 adopted children   Has living will   Son is health care power of attorney   Would accept resuscitation but no prolonged ventilation   No tube feeds if cognitively unaware   Review of Systems Tried off mirapex after last visit-- but RLS acted up. So he went back on Appetite is fine Weight stable Sleeping okay in general Frequent nocturia but able to get right back to sleep    Objective:   Physical Exam  Constitutional: He appears  well-developed and well-nourished. No distress.  Neck: Normal range of motion. Neck supple. No thyromegaly present.  Cardiovascular: Normal rate, regular rhythm and normal heart sounds.  Exam reveals no gallop.   No murmur heard. Pulmonary/Chest: Effort normal and breath sounds normal. No respiratory distress. He has no wheezes. He has no rales.  Musculoskeletal: He exhibits no edema.  Lymphadenopathy:    He has no cervical adenopathy.  Neurological:  Small steps but normal gait otherwise No focal leg weakness Normal tone  Psychiatric: He has a normal mood and affect. His behavior is normal.          Assessment & Plan:

## 2014-11-15 ENCOUNTER — Encounter: Payer: Self-pay | Admitting: Internal Medicine

## 2014-11-24 ENCOUNTER — Other Ambulatory Visit: Payer: Self-pay | Admitting: Internal Medicine

## 2014-11-24 NOTE — Telephone Encounter (Signed)
Approved: #[id number] x 0 

## 2014-11-24 NOTE — Telephone Encounter (Signed)
rx called into pharmacy

## 2014-11-24 NOTE — Telephone Encounter (Signed)
07/29/2014 

## 2014-11-25 ENCOUNTER — Ambulatory Visit: Payer: Medicare Other | Admitting: Internal Medicine

## 2014-11-25 DIAGNOSIS — C44311 Basal cell carcinoma of skin of nose: Secondary | ICD-10-CM | POA: Diagnosis not present

## 2014-11-26 ENCOUNTER — Ambulatory Visit: Payer: Medicare Other | Admitting: Internal Medicine

## 2014-12-04 DIAGNOSIS — Z96651 Presence of right artificial knee joint: Secondary | ICD-10-CM | POA: Diagnosis not present

## 2014-12-04 DIAGNOSIS — M25561 Pain in right knee: Secondary | ICD-10-CM | POA: Diagnosis not present

## 2014-12-04 DIAGNOSIS — Z96652 Presence of left artificial knee joint: Secondary | ICD-10-CM | POA: Diagnosis not present

## 2014-12-04 DIAGNOSIS — M25562 Pain in left knee: Secondary | ICD-10-CM | POA: Diagnosis not present

## 2014-12-09 ENCOUNTER — Encounter: Payer: Self-pay | Admitting: Internal Medicine

## 2014-12-10 DIAGNOSIS — H34832 Tributary (branch) retinal vein occlusion, left eye, with macular edema: Secondary | ICD-10-CM | POA: Diagnosis not present

## 2014-12-25 DIAGNOSIS — Z23 Encounter for immunization: Secondary | ICD-10-CM | POA: Diagnosis not present

## 2015-01-16 DIAGNOSIS — H34832 Tributary (branch) retinal vein occlusion, left eye, with macular edema: Secondary | ICD-10-CM | POA: Diagnosis not present

## 2015-01-19 ENCOUNTER — Other Ambulatory Visit: Payer: Self-pay | Admitting: Internal Medicine

## 2015-01-21 ENCOUNTER — Ambulatory Visit (INDEPENDENT_AMBULATORY_CARE_PROVIDER_SITE_OTHER): Payer: Medicare Other | Admitting: Primary Care

## 2015-01-21 VITALS — BP 154/86 | HR 71 | Temp 97.1°F | Wt 192.4 lb

## 2015-01-21 DIAGNOSIS — M791 Myalgia, unspecified site: Secondary | ICD-10-CM

## 2015-01-21 MED ORDER — TRAMADOL HCL 50 MG PO TABS: 50.0000 mg | ORAL_TABLET | Freq: Two times a day (BID) | ORAL | Status: DC | PRN

## 2015-01-21 MED ORDER — PREDNISONE 10 MG PO TABS
ORAL_TABLET | ORAL | Status: DC
Start: 2015-01-21 — End: 2015-02-26

## 2015-01-21 NOTE — Patient Instructions (Signed)
Your symptoms represent a torn muscle. Stop playing golf for 4 weeks to allow for rest of your muscle.  Start Prednisone tablets. Take 3 tablets for 3 days, then 2 tablets for 3 days, then 1 tablet for 3 days.  You may take Tramadol 50 mg twice daily as needed for severe pain.  Please call me if no improvement in your pain in 2 weeks, or if you have weakness in your legs or increased pain.  It was a pleasure to see you today! Happy Thanksgiving!

## 2015-01-21 NOTE — Progress Notes (Signed)
Pre visit review using our clinic review tool, if applicable. No additional management support is needed unless otherwise documented below in the visit note. 

## 2015-01-21 NOTE — Progress Notes (Signed)
Subjective:    Patient ID: Stephen Garrett, male    DOB: September 12, 1923, 81 y.o.   MRN: [id number]  HPI  Stephen Garrett is a 81 year old male who presents today with a chief complaint of groin pain. His pain is located to the right groin and has been present since golfing 1-2 weeks ago. He believes he tore a muscle 1-2 weeks ago while at the golf range. He felt no pain as he was golfing but later that day he noticed the pain as he was driving home. His pain had begun to improve until today. He played golf this morning at 11 am and his pain became worse shortly after. His pain is worse with movement (sitting, standing, lifting his right leg). He's not taken any medication for his pain. He has a history of torn abductor muscle 15 years ago. Denies numbness/tingling, reduced strength. He's not played golf in between today and 2 weeks ago.  Review of Systems  Constitutional: Negative for fever and chills.  Genitourinary: Negative for penile pain and testicular pain.  Musculoskeletal: Positive for myalgias.  Neurological: Negative for weakness and numbness.       Past Medical History  Diagnosis Date  . Diverticulosis of colon 06/2003  . GERD (gastroesophageal reflux disease)   . HLD (hyperlipidemia)   . HTN (hypertension)   . BPH (benign prostatic hypertrophy)   . Osteoarthritis of knee     severe, bilat  . Renal insufficiency   . Actinic keratosis   . Diseases of lips   . ED (erectile dysfunction)   . Restless legs syndrome (RLS)   . Sleep disturbance, unspecified   . Spinal stenosis, lumbar region, without neurogenic claudication     Social History   Social History  . Marital Status: Widowed    Spouse Name: N/A  . Number of Children: 2  . Years of Education: N/A   Occupational History  . Retired    Social History Main Topics  . Smoking status: Former Smoker    Quit date: 02/28/1970  . Smokeless tobacco: Never Used  . Alcohol Use: 0.0 oz/week    0 Standard drinks or equivalent  per week     Comment: 1-2 glasses wine/daily  . Drug Use: No  . Sexual Activity: Not on file   Other Topics Concern  . Not on file   Social History Narrative   Widowed 2010   2 adopted children   Has living will   Son is health care power of attorney   Would accept resuscitation but no prolonged ventilation   No tube feeds if cognitively unaware    Past Surgical History  Procedure Laterality Date  . Finger surgery      Right 4th finger  . Tonsillectomy  childhood  . Lumbar spine surgery  06/2005    stenosis repair  . Total knee arthroplasty  06/2008    bilat (Dr. Marry Guan)  . Cataract extraction, bilateral  2011    Dr. Thomasene Ripple  . Lih  10/2009    Dr. Bary Castilla    Family History  Problem Relation Age of Onset  . Other Father     "clogged arteries"  . Hypertension Mother     Allergies  Allergen Reactions  . Atorvastatin     REACTION: raises blood pressure  . Pravastatin Sodium     REACTION: raises blood pressure  . Statins     REACTION: raises bp    Current Outpatient Prescriptions on File Prior to  Visit  Medication Sig Dispense Refill  . aspirin 81 MG tablet Take 81 mg by mouth every other day.     Marland Kitchen buPROPion (WELLBUTRIN XL) 150 MG 24 hr tablet TAKE ONE TABLET BY MOUTH EVERY DAY 90 tablet 3  . clonazePAM (KLONOPIN) 0.5 MG tablet TAKE 1 OR 2 TABLETS BY MOUTH AT BEDTIME 120 tablet 0  . doxazosin (CARDURA) 8 MG tablet Take 1 tablet (8 mg total) by mouth daily. 90 tablet 3  . finasteride (PROSCAR) 5 MG tablet TAKE ONE TABLET BY MOUTH EVERY DAY 90 tablet 3  . fish oil-omega-3 fatty acids 1000 MG capsule Take 1 g by mouth every other day.     . losartan-hydrochlorothiazide (HYZAAR) 100-25 MG per tablet TAKE ONE TABLET BY MOUTH EVERY DAY 90 tablet 3  . Multiple Vitamin (MULTIVITAMIN) capsule Take 1 capsule by mouth daily.      Marland Kitchen omeprazole (PRILOSEC) 20 MG capsule TAKE ONE CAPSULE BY MOUTH EVERY DAY 90 capsule 3  . pramipexole (MIRAPEX) 1 MG tablet TAKE 1 OR 2 TABLETS  AT BEDTIME AS NEEDED 180 tablet 3  . [DISCONTINUED] citalopram (CELEXA) 10 MG tablet Take 1 tablet (10 mg total) by mouth daily. 30 tablet 5   No current facility-administered medications on file prior to visit.    BP 154/86 mmHg  Pulse 71  Temp(Src) 97.1 F (36.2 C) (Oral)  Wt 192 lb 6.4 oz (87.272 kg)  SpO2 97%    Objective:   Physical Exam  Constitutional: He appears well-nourished.  Cardiovascular: Normal rate and regular rhythm.   Pulmonary/Chest: Effort normal and breath sounds normal.  Genitourinary: Testes normal and penis normal.     Musculoskeletal:  Good strength to both lower extremities. Pain to right groin with sitting and standing. No pain with walking. Good ROM. Non tender.   Skin: Skin is warm and dry.          Assessment & Plan:  Muscle tear:  Right groin pain x 2 weeks since playing golf. Worse today after playing golf. Pain worse with sitting and standing. Non tender. Good strength and ROM. Suspect muscle tear. Will treat with prednisone taper and tramadol PRN severe pain. Discussed that he will need to refrain from golf for 4 weeks. He is to notify us if no improvement in pain in 2 weeks or if he notices weakness.

## 2015-02-18 DIAGNOSIS — H34832 Tributary (branch) retinal vein occlusion, left eye, with macular edema: Secondary | ICD-10-CM | POA: Diagnosis not present

## 2015-02-19 DIAGNOSIS — Z85820 Personal history of malignant melanoma of skin: Secondary | ICD-10-CM | POA: Diagnosis not present

## 2015-02-19 DIAGNOSIS — X32XXXA Exposure to sunlight, initial encounter: Secondary | ICD-10-CM | POA: Diagnosis not present

## 2015-02-19 DIAGNOSIS — D1801 Hemangioma of skin and subcutaneous tissue: Secondary | ICD-10-CM | POA: Diagnosis not present

## 2015-02-19 DIAGNOSIS — L821 Other seborrheic keratosis: Secondary | ICD-10-CM | POA: Diagnosis not present

## 2015-02-19 DIAGNOSIS — Z85828 Personal history of other malignant neoplasm of skin: Secondary | ICD-10-CM | POA: Diagnosis not present

## 2015-02-19 DIAGNOSIS — L570 Actinic keratosis: Secondary | ICD-10-CM | POA: Diagnosis not present

## 2015-02-26 ENCOUNTER — Telehealth: Payer: Self-pay

## 2015-02-26 ENCOUNTER — Encounter: Payer: Self-pay | Admitting: Family Medicine

## 2015-02-26 ENCOUNTER — Ambulatory Visit (INDEPENDENT_AMBULATORY_CARE_PROVIDER_SITE_OTHER): Payer: Medicare Other | Admitting: Family Medicine

## 2015-02-26 VITALS — BP 140/78 | HR 69 | Temp 98.2°F | Ht 67.0 in | Wt 195.5 lb

## 2015-02-26 DIAGNOSIS — R079 Chest pain, unspecified: Secondary | ICD-10-CM

## 2015-02-26 DIAGNOSIS — R51 Headache: Secondary | ICD-10-CM | POA: Diagnosis not present

## 2015-02-26 DIAGNOSIS — G478 Other sleep disorders: Secondary | ICD-10-CM

## 2015-02-26 DIAGNOSIS — R519 Headache, unspecified: Secondary | ICD-10-CM

## 2015-02-26 LAB — CK TOTAL AND CKMB (NOT AT ARMC)
CK, MB: 5.2 ng/mL — ABNORMAL HIGH (ref 0.0–5.0)
Relative Index: 8.4 — ABNORMAL HIGH (ref 0.0–4.0)
Total CK: 62 U/L (ref 7–232)

## 2015-02-26 LAB — TROPONIN I: Troponin I: 0.05 ng/mL (ref <0.06)

## 2015-02-26 NOTE — Progress Notes (Signed)
Pre visit review using our clinic review tool, if applicable. No additional management support is needed unless otherwise documented below in the visit note. 

## 2015-02-26 NOTE — Progress Notes (Signed)
Dr. Frederico Hamman T. Yidel Teuscher, MD, [facility] [place], [address] Phone: [phone] Fax: [phone]  02/26/2015  Patient: Stephen Garrett, MRN: [id number], DOB: April 28, 1923, 81 y.o.  Primary Physician:  Viviana Simpler, MD   Chief Complaint  Patient presents with  . Trouble Sleeping    Last Night  . Abdominal Pain    Started on Right Side then moved to the middle-A pain he has never felt before   Subjective:   Stephen Garrett is a 75 y.o. very pleasant male patient who presents with the following:  Elderly patient in overall fairly good health at 55 with no prior cardiac history, had some R abdominal pain and chest pain lasting 5-10 minutes, that completely resolved. Currently he is asymptomatic. No Abd pain.   Shart R sided pain, 5-10 minutes, then some pain. No chest pain.   Sleeping poorly and took 2 early Klonopin tabs in the middle of the night.  Took some extra klonopin at 2 AM.  No abdominal pain now.  Gone since then.  Now HA, some sleepiness, and some overall weakness.   Past Medical History, Surgical History, Social History, Family History, Problem List, Medications, and Allergies have been reviewed and updated if relevant.  Patient Active Problem List   Diagnosis Date Noted  . Leg weakness, bilateral 11/14/2014  . Memory loss 04/22/2014  . Retinal vein occlusion, central 02/19/2014  . Chronic venous insufficiency 02/19/2014  . Episodic mood disorder ([place]) 12/20/2013  . Advanced directives, counseling/discussion 12/20/2013  . Left leg pain 11/22/2013  . Glenohumeral arthritis 12/06/2012  . Routine general medical examination at a health care facility 11/02/2012  . ACTINIC KERATOSIS 11/18/2009  . Chronic kidney disease, stage III (moderate) 01/26/2009  . Restless legs syndrome 10/03/2008  . OSTEOARTHRITIS 12/12/2006  . SPINAL STENOSIS, LUMBAR 06/24/2006  . Hyperlipemia 06/21/2006  . RESTLESS LEG  SYNDROME 06/21/2006  . Essential hypertension, benign 06/21/2006  . GERD 06/21/2006  . DIVERTICULOSIS, COLON 06/21/2006  . BENIGN PROSTATIC HYPERTROPHY 06/21/2006    Past Medical History  Diagnosis Date  . Diverticulosis of colon 06/2003  . GERD (gastroesophageal reflux disease)   . HLD (hyperlipidemia)   . HTN (hypertension)   . BPH (benign prostatic hypertrophy)   . Osteoarthritis of knee     severe, bilat  . Renal insufficiency   . Actinic keratosis   . Diseases of lips   . ED (erectile dysfunction)   . Restless legs syndrome (RLS)   . Sleep disturbance, unspecified   . Spinal stenosis, lumbar region, without neurogenic claudication     Past Surgical History  Procedure Laterality Date  . Finger surgery      Right 4th finger  . Tonsillectomy  childhood  . Lumbar spine surgery  06/2005    stenosis repair  . Total knee arthroplasty  06/2008    bilat (Dr. Marry Guan)  . Cataract extraction, bilateral  2011    Dr. Thomasene Ripple  . Lih  10/2009    Dr. Bary Castilla    Social History   Social History  . Marital Status: Widowed    Spouse Name: N/A  . Number of Children: 2  . Years of Education: N/A   Occupational History  . Retired    Social History Main Topics  . Smoking status: Former Smoker    Quit date: 02/28/1970  . Smokeless tobacco: Never Used  . Alcohol Use: 0.0 oz/week    0 Standard drinks or equivalent  per week     Comment: 1-2 glasses wine/daily  . Drug Use: No  . Sexual Activity: Not on file   Other Topics Concern  . Not on file   Social History Narrative   Widowed 2010   2 adopted children   Has living will   Son is health care power of attorney   Would accept resuscitation but no prolonged ventilation   No tube feeds if cognitively unaware    Family History  Problem Relation Age of Onset  . Other Father     "clogged arteries"  . Hypertension Mother     Allergies  Allergen Reactions  . Atorvastatin     REACTION: raises blood pressure  .  Pravastatin Sodium     REACTION: raises blood pressure  . Statins     REACTION: raises bp    Medication list reviewed and updated in full in [facility].  ROS: GEN: Acute illness details above GI: Tolerating PO intake GU: maintaining adequate hydration and urination Pulm: No SOB Interactive and getting along well at home.  Otherwise, ROS is as per the HPI.   Objective:   BP 140/78 mmHg  Pulse 69  Temp(Src) 98.2 F (36.8 C) (Oral)  Ht 5\' 7"  (1.702 m)  Wt 195 lb 8 oz (88.678 kg)  BMI 30.61 kg/m2  GEN: WDWN, NAD, Non-toxic, A & O x 3 HEENT: Atraumatic, Normocephalic. Neck supple. No masses, No LAD. Ears and Nose: No external deformity. CV: RRR, No M/G/R. No JVD. No thrill. No extra heart sounds. PULM: CTA B, no wheezes, crackles, rhonchi. No retractions. No resp. distress. No accessory muscle use. ABD: S, NT, ND, +BS. No rebound. No HSM. EXTR: No c/c/e NEURO Normal gait.  PSYCH: Normally interactive. Conversant. Not depressed or anxious appearing.  Calm demeanor.     Laboratory and Imaging Data: Results for orders placed or performed in visit on 02/26/15  CK total and CKMB (cardiac)not at [facility]  Result Value Ref Range   Total CK 62 7 - 232 U/L   CK, MB 5.2 (H) 0.0 - 5.0 ng/mL   Relative Index 8.4 (H) 0.0 - 4.0  Troponin I  Result Value Ref Range   Troponin I 0.05 <0.06 ng/mL     Assessment and Plan:   Chest pain, unspecified chest pain type - Plan: EKG 12-Lead, CK total and CKMB (cardiac)not at [facility], Troponin I, CANCELED: Troponin T, CANCELED: CK total and CKMB (cardiac)not at [facility]  Headache, unspecified headache type - Plan: CK total and CKMB (cardiac)not at [facility], Troponin I  Poor sleep pattern - Plan: CK total and CKMB (cardiac)not at [facility], Troponin I  EKG: Normal sinus rhythm. Normal axis, normal R wave progression, No acute ST elevation or depression.   Chest pain for 5-10 mins in setting of poor sleep and now gone.  EKG reassuring.   Check Trop and  CK-MB. Normal troponin I and minimally elev CKMB with CK of 62  Discussed with his PCP and we felt given new onset nature further evaluation would be a good nature and will have him f/u with cardiology for further evaluation.  Follow-up: No Follow-up on file.  Orders Placed This Encounter  Procedures  . CK total and CKMB (cardiac)not at [facility]  . Troponin I  . EKG 12-Lead    Signed,  Avigail Pilling T. Tommye Lehenbauer, MD   Patient's Medications  New Prescriptions   No medications on file  Previous Medications   ASPIRIN 81 MG TABLET  Take 81 mg by mouth every other day.    BUPROPION (WELLBUTRIN XL) 150 MG 24 HR TABLET    TAKE ONE TABLET BY MOUTH EVERY DAY   CLONAZEPAM (KLONOPIN) 0.5 MG TABLET    TAKE 1 OR 2 TABLETS BY MOUTH AT BEDTIME   DOXAZOSIN (CARDURA) 8 MG TABLET    Take 1 tablet (8 mg total) by mouth daily.   FINASTERIDE (PROSCAR) 5 MG TABLET    TAKE ONE TABLET BY MOUTH EVERY DAY   FISH OIL-OMEGA-3 FATTY ACIDS 1000 MG CAPSULE    Take 1 g by mouth every other day.    LOSARTAN-HYDROCHLOROTHIAZIDE (HYZAAR) 100-25 MG PER TABLET    TAKE ONE TABLET BY MOUTH EVERY DAY   MULTIPLE VITAMIN (MULTIVITAMIN) CAPSULE    Take 1 capsule by mouth daily.     OMEPRAZOLE (PRILOSEC) 20 MG CAPSULE    TAKE ONE CAPSULE BY MOUTH EVERY DAY   PRAMIPEXOLE (MIRAPEX) 1 MG TABLET    TAKE 1 OR 2 TABLETS AT BEDTIME AS NEEDED   TRAMADOL (ULTRAM) 50 MG TABLET    Take 1 tablet (50 mg total) by mouth every 12 (twelve) hours as needed for severe pain.  Modified Medications   No medications on file  Discontinued Medications   PREDNISONE (DELTASONE) 10 MG TABLET    Take 3 tablets daily for 3 days, then 2 tablets daily for 3 days, then 1 tablet daily for 3 days.

## 2015-02-26 NOTE — Telephone Encounter (Addendum)
Pt walked in; one hour ago pt had sharp pain rt upper side for 5-10 mins and then sharp pain moved to mid upper abd at sternum for 5 - 10 mins; then pain went away. No CP or SOB now. Pt does have h/a pain level 5 and slight dizziness. Last night pt could not sleep due to restless leg; took Klonopin 0.5 mg one tab at 10 PM and 2nd tab at 1:30 AM. Pt went to sleep around 2 AM and slept all night. This AM pt has not eaten. Now T 97.8, P 72 and pulse ox 96 %; BP 160/84. Pt said at no time did pain go to shoulder or neck and no sweating. Pt is in waiting room to see Dr Lorelei Pont today at 10:45 (1st available appt). Butch Penny CMA is aware pt is in waiting room and if pt condition changes or worsens prior to appt pt will let front desk know. Pt is driving. Pt does not appear in any distress.

## 2015-03-01 ENCOUNTER — Encounter: Payer: Self-pay | Admitting: Family Medicine

## 2015-03-02 ENCOUNTER — Other Ambulatory Visit: Payer: Self-pay | Admitting: Internal Medicine

## 2015-03-03 NOTE — Telephone Encounter (Signed)
Received refill request electronically See drug warning with Citalopram Is it okay to refill?

## 2015-03-08 NOTE — Telephone Encounter (Signed)
Please check to see if he has been referred for a cardiology evaluation

## 2015-03-09 NOTE — Telephone Encounter (Signed)
I see a referral was made last week---I just want to make sure he gets an appt

## 2015-03-09 NOTE — Telephone Encounter (Signed)
Yes he has with Dr Fletcher Anon 04/20/2015

## 2015-03-16 ENCOUNTER — Other Ambulatory Visit: Payer: Self-pay | Admitting: Internal Medicine

## 2015-03-16 NOTE — Telephone Encounter (Signed)
Approved: #[id number] x 0 

## 2015-03-16 NOTE — Telephone Encounter (Signed)
rx called into pharmacy

## 2015-03-16 NOTE — Telephone Encounter (Signed)
11/24/2014 

## 2015-03-23 DIAGNOSIS — H34832 Tributary (branch) retinal vein occlusion, left eye, with macular edema: Secondary | ICD-10-CM | POA: Diagnosis not present

## 2015-03-27 ENCOUNTER — Ambulatory Visit (INDEPENDENT_AMBULATORY_CARE_PROVIDER_SITE_OTHER): Payer: Medicare Other | Admitting: Internal Medicine

## 2015-03-27 ENCOUNTER — Encounter: Payer: Self-pay | Admitting: Internal Medicine

## 2015-03-27 VITALS — BP 130/78 | Temp 97.5°F | Wt 188.0 lb

## 2015-03-27 DIAGNOSIS — R490 Dysphonia: Secondary | ICD-10-CM | POA: Diagnosis not present

## 2015-03-27 DIAGNOSIS — S29009A Unspecified injury of muscle and tendon of unspecified wall of thorax, initial encounter: Secondary | ICD-10-CM | POA: Diagnosis not present

## 2015-03-27 DIAGNOSIS — S29019A Strain of muscle and tendon of unspecified wall of thorax, initial encounter: Secondary | ICD-10-CM | POA: Insufficient documentation

## 2015-03-27 NOTE — Patient Instructions (Signed)
Please add an extra omeprazole at bedtime for the next 2 weeks. Let me know if the hoarseness persists after 2 weeks, I will set you up with an ENT.

## 2015-03-27 NOTE — Progress Notes (Signed)
Subjective:    Patient ID: Stephen Garrett, male    DOB: 1923-11-21, 82 y.o.   MRN: [id number]  HPI Having trouble with his throat Voice sounds different for 4-5 days Doesn't feel sick Slight cough No heartburn No swallowing problems  Left low back pain Knows he injured it opening a lock on a gate-- 3 mornings ago Very painful at first--- even hard to sit or stand Much better this morning No meds for this and no treatment  Current Outpatient Prescriptions on File Prior to Visit  Medication Sig Dispense Refill  . aspirin 81 MG tablet Take 81 mg by mouth every other day.     Marland Kitchen buPROPion (WELLBUTRIN XL) 150 MG 24 hr tablet TAKE ONE TABLET BY MOUTH EVERY DAY 90 tablet 3  . clonazePAM (KLONOPIN) 0.5 MG tablet TAKE 1 OR 2 TABLETS BY MOUTH AT BEDTIME 120 tablet 0  . doxazosin (CARDURA) 8 MG tablet Take 1 tablet (8 mg total) by mouth daily. 90 tablet 3  . finasteride (PROSCAR) 5 MG tablet TAKE ONE TABLET BY MOUTH EVERY DAY 90 tablet 3  . fish oil-omega-3 fatty acids 1000 MG capsule Take 1 g by mouth every other day.     . losartan-hydrochlorothiazide (HYZAAR) 100-25 MG per tablet TAKE ONE TABLET BY MOUTH EVERY DAY 90 tablet 3  . Multiple Vitamin (MULTIVITAMIN) capsule Take 1 capsule by mouth daily.      Marland Kitchen omeprazole (PRILOSEC) 20 MG capsule TAKE ONE CAPSULE BY MOUTH EVERY DAY 90 capsule 0  . pramipexole (MIRAPEX) 1 MG tablet TAKE 1 OR 2 TABLETS AT BEDTIME AS NEEDED 180 tablet 3  . traMADol (ULTRAM) 50 MG tablet Take 1 tablet (50 mg total) by mouth every 12 (twelve) hours as needed for severe pain. 20 tablet 0  . [DISCONTINUED] citalopram (CELEXA) 10 MG tablet Take 1 tablet (10 mg total) by mouth daily. 30 tablet 5   No current facility-administered medications on file prior to visit.    Allergies  Allergen Reactions  . Atorvastatin     REACTION: raises blood pressure  . Pravastatin Sodium     REACTION: raises blood pressure  . Statins     REACTION: raises bp    Past Medical  History  Diagnosis Date  . Diverticulosis of colon 06/2003  . GERD (gastroesophageal reflux disease)   . HLD (hyperlipidemia)   . HTN (hypertension)   . BPH (benign prostatic hypertrophy)   . Osteoarthritis of knee     severe, bilat  . Renal insufficiency   . Actinic keratosis   . Diseases of lips   . ED (erectile dysfunction)   . Restless legs syndrome (RLS)   . Sleep disturbance, unspecified   . Spinal stenosis, lumbar region, without neurogenic claudication     Past Surgical History  Procedure Laterality Date  . Finger surgery      Right 4th finger  . Tonsillectomy  childhood  . Lumbar spine surgery  06/2005    stenosis repair  . Total knee arthroplasty  06/2008    bilat (Dr. Marry Guan)  . Cataract extraction, bilateral  2011    Dr. Thomasene Ripple  . Lih  10/2009    Dr. Bary Castilla    Family History  Problem Relation Age of Onset  . Other Father     "clogged arteries"  . Hypertension Mother     Social History   Social History  . Marital Status: Widowed    Spouse Name: N/A  . Number of Children: 2  .  Years of Education: N/A   Occupational History  . Retired    Social History Main Topics  . Smoking status: Former Smoker    Quit date: 02/28/1970  . Smokeless tobacco: Never Used  . Alcohol Use: 0.0 oz/week    0 Standard drinks or equivalent per week     Comment: 1-2 glasses wine/daily  . Drug Use: No  . Sexual Activity: Not on file   Other Topics Concern  . Not on file   Social History Narrative   Widowed 2010   2 adopted children   Has living will   Son is health care power of attorney   Would accept resuscitation but no prolonged ventilation   No tube feeds if cognitively unaware   Review of Systems  Has been on Nutrisystem diet for a week--- has lost 8# Hopes to play tennis in tournaments again     Objective:   Physical Exam  Constitutional: He appears well-developed and well-nourished. No distress.  HENT:  Mouth/Throat: Oropharynx is clear and  moist. No oropharyngeal exudate.  Neck: Normal range of motion. Neck supple. No thyromegaly present.  Musculoskeletal:  Point tenderness laterally over left T11 in posterior axillary line No spine tenderness  Lymphadenopathy:    He has no cervical adenopathy.          Assessment & Plan:

## 2015-03-27 NOTE — Assessment & Plan Note (Signed)
Probably acid related Will add bedtime omeprazole for 2 weeks ENT if persists

## 2015-03-27 NOTE — Assessment & Plan Note (Signed)
Discussed that this is self limited Heat/acetaminophen

## 2015-03-29 ENCOUNTER — Encounter: Payer: Self-pay | Admitting: Internal Medicine

## 2015-04-06 DIAGNOSIS — H34832 Tributary (branch) retinal vein occlusion, left eye, with macular edema: Secondary | ICD-10-CM | POA: Diagnosis not present

## 2015-04-10 ENCOUNTER — Encounter: Payer: Self-pay | Admitting: Internal Medicine

## 2015-04-10 DIAGNOSIS — R490 Dysphonia: Secondary | ICD-10-CM

## 2015-04-20 ENCOUNTER — Encounter: Payer: Self-pay | Admitting: Cardiovascular Disease

## 2015-04-20 ENCOUNTER — Ambulatory Visit (INDEPENDENT_AMBULATORY_CARE_PROVIDER_SITE_OTHER): Payer: Medicare Other | Admitting: Cardiovascular Disease

## 2015-04-20 VITALS — BP 160/90 | HR 66 | Ht 66.0 in | Wt 187.0 lb

## 2015-04-20 DIAGNOSIS — I10 Essential (primary) hypertension: Secondary | ICD-10-CM | POA: Diagnosis not present

## 2015-04-20 DIAGNOSIS — R079 Chest pain, unspecified: Secondary | ICD-10-CM | POA: Diagnosis not present

## 2015-04-20 NOTE — Assessment & Plan Note (Signed)
His blood pressure is mildly elevated today.

## 2015-04-20 NOTE — Progress Notes (Signed)
HPI  This is a pleasant 82 year old male who was referred by Dr. Lorelei Pont and Dr. Silvio Pate for evaluation of chest pain. He has no previous cardiac history. He has known history of hypertension and remote history of tobacco use. He is not diabetic.he has familyhistory of coronary artery disease but not prematurely. The exact details are not available. He reports one episode of chest pain in December. He had sharp pain in the right side in the upper side of his abdomen and back which lasted for about 5-10 minutes. This was followed by another 5-10 minutes of substernal chest pain described as aching. There was no shortness of breath, palpitations or syncope. These episodes happen at rest and not with physical activities. He reports no further episodes since then. He had labs done which were unremarkable including negative troponin. The patient is independent and lives at 5 leg. He stays active.  Allergies  Allergen Reactions  . Atorvastatin     REACTION: raises blood pressure  . Pravastatin Sodium     REACTION: raises blood pressure  . Statins     REACTION: raises bp     Current Outpatient Prescriptions on File Prior to Visit  Medication Sig Dispense Refill  . aspirin 81 MG tablet Take 81 mg by mouth every other day.     Marland Kitchen buPROPion (WELLBUTRIN XL) 150 MG 24 hr tablet TAKE ONE TABLET BY MOUTH EVERY DAY 90 tablet 3  . clonazePAM (KLONOPIN) 0.5 MG tablet TAKE 1 OR 2 TABLETS BY MOUTH AT BEDTIME 120 tablet 0  . doxazosin (CARDURA) 8 MG tablet Take 1 tablet (8 mg total) by mouth daily. 90 tablet 3  . finasteride (PROSCAR) 5 MG tablet TAKE ONE TABLET BY MOUTH EVERY DAY 90 tablet 3  . fish oil-omega-3 fatty acids 1000 MG capsule Take 1 g by mouth every other day.     . losartan-hydrochlorothiazide (HYZAAR) 100-25 MG per tablet TAKE ONE TABLET BY MOUTH EVERY DAY 90 tablet 3  . Multiple Vitamin (MULTIVITAMIN) capsule Take 1 capsule by mouth daily.      Marland Kitchen omeprazole (PRILOSEC) 20 MG capsule TAKE ONE  CAPSULE BY MOUTH EVERY DAY 90 capsule 0  . pramipexole (MIRAPEX) 1 MG tablet TAKE 1 OR 2 TABLETS AT BEDTIME AS NEEDED 180 tablet 3  . traMADol (ULTRAM) 50 MG tablet Take 1 tablet (50 mg total) by mouth every 12 (twelve) hours as needed for severe pain. 20 tablet 0  . [DISCONTINUED] citalopram (CELEXA) 10 MG tablet Take 1 tablet (10 mg total) by mouth daily. 30 tablet 5   No current facility-administered medications on file prior to visit.     Past Medical History  Diagnosis Date  . Diverticulosis of colon 06/2003  . GERD (gastroesophageal reflux disease)   . HLD (hyperlipidemia)   . HTN (hypertension)   . BPH (benign prostatic hypertrophy)   . Osteoarthritis of knee     severe, bilat  . Actinic keratosis   . Diseases of lips   . ED (erectile dysfunction)   . Restless legs syndrome (RLS)   . Sleep disturbance, unspecified   . Spinal stenosis, lumbar region, without neurogenic claudication   . Heart murmur     as child  . Renal insufficiency      Past Surgical History  Procedure Laterality Date  . Finger surgery      Right 4th finger  . Tonsillectomy  childhood  . Lumbar spine surgery  06/2005    stenosis repair  . Total knee  arthroplasty  06/2008    bilat (Dr. Marry Guan)  . Cataract extraction, bilateral  2011    Dr. Thomasene Ripple  . Lih  10/2009    Dr. Bary Castilla     Family History  Problem Relation Age of Onset  . Other Father     "clogged arteries"  . Hypertension Mother      Social History   Social History  . Marital Status: Widowed    Spouse Name: N/A  . Number of Children: 2  . Years of Education: N/A   Occupational History  . Retired    Social History Main Topics  . Smoking status: Former Smoker    Quit date: 02/28/1970  . Smokeless tobacco: Never Used  . Alcohol Use: 0.0 oz/week    0 Standard drinks or equivalent per week     Comment: 1-2 glasses wine/daily  . Drug Use: No  . Sexual Activity: Not on file   Other Topics Concern  . Not on file    Social History Narrative   Widowed 2010   2 adopted children   Has living will   Son is health care power of attorney   Would accept resuscitation but no prolonged ventilation   No tube feeds if cognitively unaware     ROS A 10 point review of system was performed. It is negative other than that mentioned in the history of present illness.   PHYSICAL EXAM   BP 160/90 mmHg  Pulse 66  Ht 5\' 6"  (1.676 m)  Wt 187 lb (84.823 kg)  BMI 30.20 kg/m2 Constitutional: He is oriented to person, place, and time. He appears well-developed and well-nourished. No distress.  HENT: No nasal discharge.  Head: Normocephalic and atraumatic.  Eyes: Pupils are equal and round.  No discharge. Neck: Normal range of motion. Neck supple. No JVD present. No thyromegaly present.  Cardiovascular: Normal rate, regular rhythm, normal heart sounds. Exam reveals no gallop and no friction rub. There is a 2/6 systolic ejection murmur at the aortic area which is early peaking.  Pulmonary/Chest: Effort normal and breath sounds normal. No stridor. No respiratory distress. He has no wheezes. He has no rales. He exhibits no tenderness.  Abdominal: Soft. Bowel sounds are normal. He exhibits no distension. There is no tenderness. There is no rebound and no guarding.  Musculoskeletal: Normal range of motion. He exhibits no edema and no tenderness.  Neurological: He is alert and oriented to person, place, and time. Coordination normal.  Skin: Skin is warm and dry. No rash noted. He is not diaphoretic. No erythema. No pallor.  Psychiatric: He has a normal mood and affect. His behavior is normal. Judgment and thought content normal.    '  [id number] sinus rhythm with no significant ST or T wave changes. No evidence of prior infarct.   ASSESSMENT AND PLAN

## 2015-04-20 NOTE — Assessment & Plan Note (Signed)
He reports one episode of chest pain which overall sounded atypical with no recurrent symptoms. He reports no exertional symptoms. His baseline ECG is normal. He does have a cardiac murmur suggestive of aortic sclerosis or mild stenosis. I requested an echocardiogram to evaluate LV systolic function and wall motion. Given that he reports no recurrent symptoms, I am not ordering a stress test at the present time.

## 2015-04-20 NOTE — Patient Instructions (Signed)
Medication Instructions:  Your physician recommends that you continue on your current medications as directed. Please refer to the Current Medication list given to you today.   Labwork: none  Testing/Procedures: Your physician has requested that you have an echocardiogram. Echocardiography is a painless test that uses sound waves to create images of your heart. It provides your doctor with information about the size and shape of your heart and how well your heart's chambers and valves are working. This procedure takes approximately one hour. There are no restrictions for this procedure.    Follow-Up: Your physician recommends that you schedule a follow-up appointment as needed with Dr. Fletcher Anon.    Any Other Special Instructions Will Be Listed Below (If Applicable).     If you need a refill on your cardiac medications before your next appointment, please call your pharmacy.  Echocardiogram An echocardiogram, or echocardiography, uses sound waves (ultrasound) to produce an image of your heart. The echocardiogram is simple, painless, obtained within a short period of time, and offers valuable information to your health care provider. The images from an echocardiogram can provide information such as:  Evidence of coronary artery disease (CAD).  Heart size.  Heart muscle function.  Heart valve function.  Aneurysm detection.  Evidence of a past heart attack.  Fluid buildup around the heart.  Heart muscle thickening.  Assess heart valve function. LET [facility] CARE PROVIDER KNOW ABOUT:  Any allergies you have.  All medicines you are taking, including vitamins, herbs, eye drops, creams, and over-the-counter medicines.  Previous problems you or members of your family have had with the use of anesthetics.  Any blood disorders you have.  Previous surgeries you have had.  Medical conditions you have.  Possibility of pregnancy, if this applies. BEFORE THE PROCEDURE  No  special preparation is needed. Eat and drink normally.  PROCEDURE   In order to produce an image of your heart, gel will be applied to your chest and a wand-like tool (transducer) will be moved over your chest. The gel will help transmit the sound waves from the transducer. The sound waves will harmlessly bounce off your heart to allow the heart images to be captured in real-time motion. These images will then be recorded.  You may need an IV to receive a medicine that improves the quality of the pictures. AFTER THE PROCEDURE You may return to your normal schedule including diet, activities, and medicines, unless your health care provider tells you otherwise.   This information is not intended to replace advice given to you by your health care provider. Make sure you discuss any questions you have with your health care provider.   Document Released: 02/12/2000 Document Revised: 03/07/2014 Document Reviewed: 10/22/2012 Elsevier Interactive Patient Education [employer].

## 2015-04-21 ENCOUNTER — Encounter (INDEPENDENT_AMBULATORY_CARE_PROVIDER_SITE_OTHER): Payer: Medicare Other | Admitting: Ophthalmology

## 2015-04-21 DIAGNOSIS — H43813 Vitreous degeneration, bilateral: Secondary | ICD-10-CM

## 2015-04-21 DIAGNOSIS — I10 Essential (primary) hypertension: Secondary | ICD-10-CM | POA: Diagnosis not present

## 2015-04-21 DIAGNOSIS — H33302 Unspecified retinal break, left eye: Secondary | ICD-10-CM | POA: Diagnosis not present

## 2015-04-21 DIAGNOSIS — H35033 Hypertensive retinopathy, bilateral: Secondary | ICD-10-CM | POA: Diagnosis not present

## 2015-04-21 DIAGNOSIS — H34832 Tributary (branch) retinal vein occlusion, left eye, with macular edema: Secondary | ICD-10-CM

## 2015-04-27 DIAGNOSIS — K219 Gastro-esophageal reflux disease without esophagitis: Secondary | ICD-10-CM | POA: Diagnosis not present

## 2015-04-27 DIAGNOSIS — R490 Dysphonia: Secondary | ICD-10-CM | POA: Diagnosis not present

## 2015-04-27 DIAGNOSIS — R05 Cough: Secondary | ICD-10-CM | POA: Diagnosis not present

## 2015-04-27 DIAGNOSIS — Z87891 Personal history of nicotine dependence: Secondary | ICD-10-CM | POA: Diagnosis not present

## 2015-04-27 DIAGNOSIS — J342 Deviated nasal septum: Secondary | ICD-10-CM | POA: Diagnosis not present

## 2015-05-07 ENCOUNTER — Other Ambulatory Visit: Payer: Self-pay

## 2015-05-07 ENCOUNTER — Ambulatory Visit (INDEPENDENT_AMBULATORY_CARE_PROVIDER_SITE_OTHER): Payer: Medicare Other

## 2015-05-07 DIAGNOSIS — R079 Chest pain, unspecified: Secondary | ICD-10-CM | POA: Diagnosis not present

## 2015-05-07 LAB — ECHOCARDIOGRAM COMPLETE
E decel time: 282 msec
FS: 33 % (ref 28–44)
IVS/LV PW RATIO, ED: 1.06
LA diam end sys: 41 cm
LV PW d: 17.2 mm — AB (ref 0.6–1.1)
LVOT area: 3.8 cm2
LVOT peak vel: 101 m/s
MV Peak grad: 3 mmHg
MV pk A vel: 92.7 m/s
MV pk E vel: 84.6 m/s
Stroke v: 74 ml
VTI: 19.6 cm

## 2015-05-11 ENCOUNTER — Other Ambulatory Visit: Payer: Self-pay | Admitting: Internal Medicine

## 2015-05-14 DIAGNOSIS — X32XXXA Exposure to sunlight, initial encounter: Secondary | ICD-10-CM | POA: Diagnosis not present

## 2015-05-14 DIAGNOSIS — L308 Other specified dermatitis: Secondary | ICD-10-CM | POA: Diagnosis not present

## 2015-05-14 DIAGNOSIS — L570 Actinic keratosis: Secondary | ICD-10-CM | POA: Diagnosis not present

## 2015-05-14 DIAGNOSIS — Z85820 Personal history of malignant melanoma of skin: Secondary | ICD-10-CM | POA: Diagnosis not present

## 2015-05-14 DIAGNOSIS — Z85828 Personal history of other malignant neoplasm of skin: Secondary | ICD-10-CM | POA: Diagnosis not present

## 2015-05-15 DIAGNOSIS — H34832 Tributary (branch) retinal vein occlusion, left eye, with macular edema: Secondary | ICD-10-CM | POA: Diagnosis not present

## 2015-05-16 ENCOUNTER — Encounter: Payer: Self-pay | Admitting: Internal Medicine

## 2015-05-25 ENCOUNTER — Encounter: Payer: Self-pay | Admitting: Internal Medicine

## 2015-05-25 ENCOUNTER — Ambulatory Visit (INDEPENDENT_AMBULATORY_CARE_PROVIDER_SITE_OTHER): Payer: Medicare Other | Admitting: Internal Medicine

## 2015-05-25 VITALS — BP 144/90 | HR 70 | Temp 97.3°F | Ht 66.25 in | Wt 185.0 lb

## 2015-05-25 DIAGNOSIS — Z0000 Encounter for general adult medical examination without abnormal findings: Secondary | ICD-10-CM

## 2015-05-25 DIAGNOSIS — F39 Unspecified mood [affective] disorder: Secondary | ICD-10-CM

## 2015-05-25 DIAGNOSIS — N400 Enlarged prostate without lower urinary tract symptoms: Secondary | ICD-10-CM

## 2015-05-25 DIAGNOSIS — I10 Essential (primary) hypertension: Secondary | ICD-10-CM | POA: Diagnosis not present

## 2015-05-25 DIAGNOSIS — N183 Chronic kidney disease, stage 3 unspecified: Secondary | ICD-10-CM

## 2015-05-25 DIAGNOSIS — G2581 Restless legs syndrome: Secondary | ICD-10-CM

## 2015-05-25 DIAGNOSIS — Z7189 Other specified counseling: Secondary | ICD-10-CM

## 2015-05-25 LAB — COMPREHENSIVE METABOLIC PANEL
ALT: 22 U/L (ref 0–53)
AST: 22 U/L (ref 0–37)
Albumin: 4.1 g/dL (ref 3.5–5.2)
Alkaline Phosphatase: 66 U/L (ref 39–117)
BUN: 41 mg/dL — ABNORMAL HIGH (ref 6–23)
CO2: 29 mEq/L (ref 19–32)
Calcium: 9.3 mg/dL (ref 8.4–10.5)
Chloride: 105 mEq/L (ref 96–112)
Creatinine, Ser: 1.59 mg/dL — ABNORMAL HIGH (ref 0.40–1.50)
GFR: 43.72 mL/min — ABNORMAL LOW (ref >60.00)
Glucose, Bld: 104 mg/dL — ABNORMAL HIGH (ref 70–99)
Potassium: 3.8 mEq/L (ref 3.5–5.1)
Sodium: 140 mEq/L (ref 135–145)
Total Bilirubin: 0.7 mg/dL (ref 0.2–1.2)
Total Protein: 6.5 g/dL (ref 6.0–8.3)

## 2015-05-25 LAB — CBC WITH DIFFERENTIAL/PLATELET
Basophils Absolute: 0 10*3/uL (ref 0.0–0.1)
Basophils Relative: 0.3 % (ref 0.0–3.0)
Eosinophils Absolute: 0.1 10*3/uL (ref 0.0–0.7)
Eosinophils Relative: 1.1 % (ref 0.0–5.0)
HCT: 38.7 % — ABNORMAL LOW (ref 39.0–52.0)
Hemoglobin: 12.9 g/dL — ABNORMAL LOW (ref 13.0–17.0)
Lymphocytes Relative: 26.8 % (ref 12.0–46.0)
Lymphs Abs: 1.7 10*3/uL (ref 0.7–4.0)
MCHC: 33.4 g/dL (ref 30.0–36.0)
MCV: 90.2 fl (ref 78.0–100.0)
Monocytes Absolute: 0.3 10*3/uL (ref 0.1–1.0)
Monocytes Relative: 5.2 % (ref 3.0–12.0)
Neutro Abs: 4.2 10*3/uL (ref 1.4–7.7)
Neutrophils Relative %: 66.6 % (ref 43.0–77.0)
Platelets: 170 10*3/uL (ref 150.0–400.0)
RBC: 4.29 Mil/uL (ref 4.22–5.81)
RDW: 15.6 % — ABNORMAL HIGH (ref 11.5–15.5)
WBC: 6.3 10*3/uL (ref 4.0–10.5)

## 2015-05-25 MED ORDER — OMEPRAZOLE 40 MG PO CPDR: 40.0000 mg | DELAYED_RELEASE_CAPSULE | Freq: Every day | ORAL | Status: DC

## 2015-05-25 MED ORDER — TETANUS-DIPHTHERIA TOXOIDS TD 5-2 LFU IM INJ: 0.5000 mL | INJECTION | Freq: Once | INTRAMUSCULAR | Status: DC

## 2015-05-25 NOTE — Assessment & Plan Note (Signed)
On ARB Will recheck labs 

## 2015-05-25 NOTE — Progress Notes (Signed)
Subjective:    Patient ID: [facility] LONGS, male    DOB: 08-Oct-1923, 82 y.o.   MRN: [id number]  HPI Here for Medicare wellness and follow up of chronic health conditions Reviewed form and advanced directives Reviewed other doctors  For 1 about 1 week-- has fullness before eating and mild pain after Does note some constipation Appetite has been okay Did try mild OTC laxative--some help No tobacco 1 glass of wine daily at most Trying to exercise regularly Vision is still not great. Injection monthly for retinal vein occlusion--not too helpful. Hearing chronically bad-- has aides and they help some No falls Independent with instrumental ADLs Ongoing memory problems---name recall, word finding. May be slightly worse  Sleeping okay Continues on meds for RLS Can't sleep if he cuts down on them Mood mostly good-- will have occasional depression that doesn't stay with him Not really anhedonic--but down at times  No chest pain No SOB No dizziness or sycnope At most slight edema---gone in AM No palpitations  No recent leg pain Back to tennis No leg weakness Hasn't even needed his tylenol  Notes a slow urinary stream Voids okay though Nocturia x 2-3----stable  Current Outpatient Prescriptions on File Prior to Visit  Medication Sig Dispense Refill  . aspirin 81 MG tablet Take 81 mg by mouth every other day.     Marland Kitchen buPROPion (WELLBUTRIN XL) 150 MG 24 hr tablet TAKE ONE TABLET BY MOUTH EVERY DAY 90 tablet 3  . clonazePAM (KLONOPIN) 0.5 MG tablet TAKE 1 OR 2 TABLETS BY MOUTH AT BEDTIME 120 tablet 0  . finasteride (PROSCAR) 5 MG tablet TAKE ONE TABLET BY MOUTH EVERY DAY 90 tablet 3  . fish oil-omega-3 fatty acids 1000 MG capsule Take 1 g by mouth every other day.     . Glucosamine-Chondroitin (GLUCOSAMINE CHONDR COMPLEX PO) Take by mouth daily.    Marland Kitchen losartan-hydrochlorothiazide (HYZAAR) 100-25 MG per tablet TAKE ONE TABLET BY MOUTH EVERY DAY 90 tablet 3  . Multiple Vitamin  (MULTIVITAMIN) capsule Take 1 capsule by mouth daily.      . pramipexole (MIRAPEX) 1 MG tablet TAKE 1 OR 2 TABLETS AT BEDTIME AS NEEDED 180 tablet 3  . pyridoxine (B-6) 100 MG tablet Take 100 mg by mouth daily.    . [DISCONTINUED] citalopram (CELEXA) 10 MG tablet Take 1 tablet (10 mg total) by mouth daily. 30 tablet 5   No current facility-administered medications on file prior to visit.    Allergies  Allergen Reactions  . Atorvastatin     REACTION: raises blood pressure  . Pravastatin Sodium     REACTION: raises blood pressure  . Statins     REACTION: raises bp    Past Medical History  Diagnosis Date  . Diverticulosis of colon 06/2003  . GERD (gastroesophageal reflux disease)   . HLD (hyperlipidemia)   . HTN (hypertension)   . BPH (benign prostatic hypertrophy)   . Osteoarthritis of knee     severe, bilat  . Actinic keratosis   . Diseases of lips   . ED (erectile dysfunction)   . Restless legs syndrome (RLS)   . Sleep disturbance, unspecified   . Spinal stenosis, lumbar region, without neurogenic claudication   . Heart murmur     as child  . Renal insufficiency     Past Surgical History  Procedure Laterality Date  . Finger surgery      Right 4th finger  . Tonsillectomy  childhood  . Lumbar spine surgery  06/2005    stenosis repair  . Total knee arthroplasty  06/2008    bilat (Dr. Marry Guan)  . Cataract extraction, bilateral  2011    Dr. Thomasene Ripple  . Lih  10/2009    Dr. Bary Castilla    Family History  Problem Relation Age of Onset  . Other Father     "clogged arteries"  . Hypertension Mother     Social History   Social History  . Marital Status: Widowed    Spouse Name: N/A  . Number of Children: 2  . Years of Education: N/A   Occupational History  . Retired    Social History Main Topics  . Smoking status: Former Smoker    Quit date: 02/28/1970  . Smokeless tobacco: Never Used  . Alcohol Use: 0.0 oz/week    0 Standard drinks or equivalent per week      Comment: 1-2 glasses wine/daily  . Drug Use: No  . Sexual Activity: Not on file   Other Topics Concern  . Not on file   Social History Narrative   Widowed 2010   2 adopted children   Has living will   Son is health care power of attorney   Would accept resuscitation but no prolonged ventilation   No tube feeds if cognitively unaware   Review of Systems No headaches Has been working on healthy eating---lost 10# in past few months Back playing tennis and hopes to play tournaments again Wears eat belt No problems with teeth Keeps up with dermatologist---just some actinics at last visit Some mild back pain--- gets better as the day goes on (AM stretching often helps)     Objective:   Physical Exam  Constitutional: He is oriented to person, place, and time. He appears well-developed and well-nourished. No distress.  HENT:  Mouth/Throat: Oropharynx is clear and moist. No oropharyngeal exudate.  Neck: Normal range of motion. Neck supple. No thyromegaly present.  Cardiovascular: Normal rate.  Exam reveals no gallop.   Feet warm without palpable pulses ?faint systolic murmur  Pulmonary/Chest: Effort normal and breath sounds normal. No respiratory distress. He has no wheezes. He has no rales.  Abdominal: Soft. He exhibits no distension. There is no tenderness. There is no rebound and no guarding.  Musculoskeletal: He exhibits no edema.  Lymphadenopathy:    He has no cervical adenopathy.  Neurological: He is alert and oriented to person, place, and time.  President-- "Dwaine Deter, Bush" [phone] [id number] Recall 3/3  Skin: No erythema.  Psychiatric: He has a normal mood and affect. His behavior is normal.          Assessment & Plan:

## 2015-05-25 NOTE — Assessment & Plan Note (Signed)
BP Readings from Last 3 Encounters:  05/25/15 144/90  04/20/15 160/90  03/27/15 130/78   Fair control for age No change

## 2015-05-25 NOTE — Assessment & Plan Note (Signed)
Voids okay on dual therapy

## 2015-05-25 NOTE — Assessment & Plan Note (Signed)
See social history 

## 2015-05-25 NOTE — Assessment & Plan Note (Signed)
Does well with the regimen Hasn't been able to wean

## 2015-05-25 NOTE — Assessment & Plan Note (Signed)
I have personally reviewed the Medicare Annual Wellness questionnaire and have noted 1. The patient's medical and social history 2. Their use of alcohol, tobacco or illicit drugs 3. Their current medications and supplements 4. The patient's functional ability including ADL's, fall risks, home safety risks and hearing or visual             impairment. 5. Diet and physical activities 6. Evidence for depression or mood disorders  The patients weight, height, BMI and visual acuity have been recorded in the chart I have made referrals, counseling and provided education to the patient based review of the above and I have provided the pt with a written personalized care plan for preventive services.  I have provided you with a copy of your personalized plan for preventive services. Please take the time to review along with your updated medication list.  No cancer screening due to age Td Rx sent to pharmacy UTD otherwise Abdominal pain--?constipation. Discussed miralax. Further evaluation if ongoing symptoms

## 2015-05-25 NOTE — Assessment & Plan Note (Signed)
Occasional down days but generally does well No Rx for this

## 2015-05-25 NOTE — Patient Instructions (Signed)
Please try miralax (polyethylene glycol) 1 capful in water daily. If you are not going well in 3-4 days--you can try an enema or dulcolax pill/suppository.

## 2015-06-13 ENCOUNTER — Encounter: Payer: Self-pay | Admitting: Internal Medicine

## 2015-06-15 DIAGNOSIS — H34832 Tributary (branch) retinal vein occlusion, left eye, with macular edema: Secondary | ICD-10-CM | POA: Diagnosis not present

## 2015-06-19 ENCOUNTER — Encounter: Payer: Self-pay | Admitting: Internal Medicine

## 2015-06-25 ENCOUNTER — Encounter: Payer: Self-pay | Admitting: Internal Medicine

## 2015-07-13 ENCOUNTER — Other Ambulatory Visit: Payer: Self-pay | Admitting: Internal Medicine

## 2015-07-13 DIAGNOSIS — H34832 Tributary (branch) retinal vein occlusion, left eye, with macular edema: Secondary | ICD-10-CM | POA: Diagnosis not present

## 2015-07-13 NOTE — Telephone Encounter (Signed)
Last filled 03-16-15 #120 Last OV 05-25-15 Next OV 05-31-16

## 2015-07-13 NOTE — Telephone Encounter (Signed)
Verbal refill given to Stephen Garrett at the pharmacy

## 2015-07-13 NOTE — Telephone Encounter (Signed)
Approved: #[id number] x 0 

## 2015-07-21 DIAGNOSIS — H903 Sensorineural hearing loss, bilateral: Secondary | ICD-10-CM | POA: Diagnosis not present

## 2015-08-02 ENCOUNTER — Encounter: Payer: Self-pay | Admitting: Internal Medicine

## 2015-08-12 DIAGNOSIS — H34832 Tributary (branch) retinal vein occlusion, left eye, with macular edema: Secondary | ICD-10-CM | POA: Diagnosis not present

## 2015-08-13 DIAGNOSIS — R208 Other disturbances of skin sensation: Secondary | ICD-10-CM | POA: Diagnosis not present

## 2015-08-13 DIAGNOSIS — Z85820 Personal history of malignant melanoma of skin: Secondary | ICD-10-CM | POA: Diagnosis not present

## 2015-08-13 DIAGNOSIS — L570 Actinic keratosis: Secondary | ICD-10-CM | POA: Diagnosis not present

## 2015-08-13 DIAGNOSIS — Z85828 Personal history of other malignant neoplasm of skin: Secondary | ICD-10-CM | POA: Diagnosis not present

## 2015-08-13 DIAGNOSIS — X32XXXA Exposure to sunlight, initial encounter: Secondary | ICD-10-CM | POA: Diagnosis not present

## 2015-08-13 DIAGNOSIS — D485 Neoplasm of uncertain behavior of skin: Secondary | ICD-10-CM | POA: Diagnosis not present

## 2015-08-13 DIAGNOSIS — Z08 Encounter for follow-up examination after completed treatment for malignant neoplasm: Secondary | ICD-10-CM | POA: Diagnosis not present

## 2015-08-13 DIAGNOSIS — C44629 Squamous cell carcinoma of skin of left upper limb, including shoulder: Secondary | ICD-10-CM | POA: Diagnosis not present

## 2015-08-24 ENCOUNTER — Other Ambulatory Visit: Payer: Self-pay | Admitting: Internal Medicine

## 2015-08-24 DIAGNOSIS — C44629 Squamous cell carcinoma of skin of left upper limb, including shoulder: Secondary | ICD-10-CM | POA: Diagnosis not present

## 2015-09-07 ENCOUNTER — Other Ambulatory Visit: Payer: Self-pay | Admitting: Internal Medicine

## 2015-09-14 DIAGNOSIS — Z85828 Personal history of other malignant neoplasm of skin: Secondary | ICD-10-CM | POA: Diagnosis not present

## 2015-09-21 ENCOUNTER — Encounter: Payer: Self-pay | Admitting: Internal Medicine

## 2015-09-24 ENCOUNTER — Telehealth: Payer: Self-pay

## 2015-09-24 ENCOUNTER — Ambulatory Visit: Payer: Medicare Other

## 2015-09-24 NOTE — Telephone Encounter (Signed)
Pt received notice from ? mychart that pt needed TD updated. Immunization list has last Td 02/*08/2004. No recent injury. There is a standing order for Td but do you want pt to have Td at age 82?Marland Kitchen Pt request cb with Dr Alla German recommendation.

## 2015-09-25 DIAGNOSIS — H34832 Tributary (branch) retinal vein occlusion, left eye, with macular edema: Secondary | ICD-10-CM | POA: Diagnosis not present

## 2015-09-26 NOTE — Telephone Encounter (Signed)
Let him know that it may not be covered by his insurance--and that I usually send a prescription to his pharmacy to get it there (they can submit it to his insurance to see if it is covered). Find out if he wants me to send it

## 2015-09-28 NOTE — Telephone Encounter (Signed)
Spoke to t. We discussed that usually after around age 82, it is not usually given very often. It can be given he gets a cut or an animal bite. We can do a rx for it if needed. He will think about it and call back if he wants to get it.

## 2015-10-12 ENCOUNTER — Other Ambulatory Visit: Payer: Self-pay | Admitting: Internal Medicine

## 2015-10-15 ENCOUNTER — Encounter: Payer: Self-pay | Admitting: Internal Medicine

## 2015-11-03 ENCOUNTER — Other Ambulatory Visit: Payer: Self-pay | Admitting: Internal Medicine

## 2015-11-04 DIAGNOSIS — H34832 Tributary (branch) retinal vein occlusion, left eye, with macular edema: Secondary | ICD-10-CM | POA: Diagnosis not present

## 2015-11-09 ENCOUNTER — Other Ambulatory Visit: Payer: Self-pay

## 2015-11-09 NOTE — Telephone Encounter (Signed)
Last filled 07-13-15 #120 Last OV 05-25-15 Next OV 05-31-16

## 2015-11-10 MED ORDER — CLONAZEPAM 0.5 MG PO TABS: ORAL_TABLET | ORAL | 0 refills | Status: DC

## 2015-11-10 NOTE — Telephone Encounter (Signed)
Verbal refill given to [facility] at the pharmacy

## 2015-11-10 NOTE — Telephone Encounter (Signed)
Approved: #[id number] x 0 

## 2015-11-12 ENCOUNTER — Encounter: Payer: Self-pay | Admitting: Internal Medicine

## 2015-11-13 DIAGNOSIS — H34832 Tributary (branch) retinal vein occlusion, left eye, with macular edema: Secondary | ICD-10-CM | POA: Diagnosis not present

## 2015-11-27 DIAGNOSIS — S8991XA Unspecified injury of right lower leg, initial encounter: Secondary | ICD-10-CM | POA: Diagnosis not present

## 2015-11-27 DIAGNOSIS — S8001XA Contusion of right knee, initial encounter: Secondary | ICD-10-CM | POA: Diagnosis not present

## 2015-11-27 DIAGNOSIS — M7989 Other specified soft tissue disorders: Secondary | ICD-10-CM | POA: Diagnosis not present

## 2015-11-30 ENCOUNTER — Encounter: Payer: Self-pay | Admitting: Family Medicine

## 2015-11-30 ENCOUNTER — Ambulatory Visit (INDEPENDENT_AMBULATORY_CARE_PROVIDER_SITE_OTHER): Payer: Medicare Other | Admitting: Family Medicine

## 2015-11-30 ENCOUNTER — Ambulatory Visit (INDEPENDENT_AMBULATORY_CARE_PROVIDER_SITE_OTHER)
Admission: RE | Admit: 2015-11-30 | Discharge: 2015-11-30 | Disposition: A | Discharge status: Home / Self Care | Payer: Medicare Other | Source: Ambulatory Visit | Attending: Family Medicine | Admitting: Family Medicine

## 2015-11-30 VITALS — BP 140/90 | HR 67 | Temp 97.5°F | Ht 66.25 in | Wt 190.8 lb

## 2015-11-30 DIAGNOSIS — S8001XD Contusion of right knee, subsequent encounter: Secondary | ICD-10-CM

## 2015-11-30 DIAGNOSIS — M79645 Pain in left finger(s): Secondary | ICD-10-CM

## 2015-11-30 DIAGNOSIS — M25562 Pain in left knee: Secondary | ICD-10-CM

## 2015-11-30 DIAGNOSIS — M7989 Other specified soft tissue disorders: Secondary | ICD-10-CM | POA: Diagnosis not present

## 2015-11-30 NOTE — Patient Instructions (Signed)
Ice knee at least 3-4 times a day, 20 minutes a time.  Compression on the knee most of the time.  OK to wear compression at night until Wed or Thursday Compression sleeve or an ACE bandage is ok.

## 2015-11-30 NOTE — Progress Notes (Signed)
Dr. Frederico Hamman T. Rohnan Bartleson, MD, [facility] [place], [address] Phone: [phone] Fax: [phone]  11/30/2015  Patient: Stephen Garrett, MRN: [id number], DOB: 07-07-1923, 82 y.o.  Primary Physician:  Viviana Simpler, MD   Chief Complaint  Patient presents with  . Fall    Friday Night on tennis court  . Joint Swelling    Right Knee  . Finger Injury    Left Ring Finger   Subjective:   Stephen Garrett is a 82 y.o. very pleasant male patient who presents with the following:  11/27/2015 DOI  R knee injury. Large hematoma of the R anterior knee. Apparently, he had a relatively massive anterior knee hematoma, and he went to urgent care at [facility]. He was seen by the urgent care physician, and also his plain films were reviewed by Dr. Marry Guan, per report, he felt like his joint arthroplasty was stable. They found no fracture.  He continues to have quite a bit of swelling and bruising. The swelling is down about 50% compared to Friday per his report.  L ring finger, he also is having pain and swelling in the PIP joint of the left ring finger. Given his pain in his right knee initially, he did not think to have this evaluated her notice it initially.  Xrays on Friday - negative for knee fracture  Past Medical History, Surgical History, Social History, Family History, Problem List, Medications, and Allergies have been reviewed and updated if relevant.  Patient Active Problem List   Diagnosis Date Noted  . Memory loss 04/22/2014  . Retinal vein occlusion, central 02/19/2014  . Chronic venous insufficiency 02/19/2014  . Episodic mood disorder ([place]) 12/20/2013  . Advanced directives, counseling/discussion 12/20/2013  . Routine general medical examination at a health care facility 11/02/2012  . ACTINIC KERATOSIS 11/18/2009  . Chronic kidney disease, stage III (moderate) 01/26/2009  . Restless legs syndrome 10/03/2008  .  OSTEOARTHRITIS 12/12/2006  . SPINAL STENOSIS, LUMBAR 06/24/2006  . Hyperlipemia 06/21/2006  . Essential hypertension, benign 06/21/2006  . GERD 06/21/2006  . DIVERTICULOSIS, COLON 06/21/2006  . BPH (benign prostatic hypertrophy) 06/21/2006    Past Medical History:  Diagnosis Date  . Actinic keratosis   . BPH (benign prostatic hypertrophy)   . Diseases of lips   . Diverticulosis of colon 06/2003  . ED (erectile dysfunction)   . GERD (gastroesophageal reflux disease)   . Heart murmur    as child  . HLD (hyperlipidemia)   . HTN (hypertension)   . Osteoarthritis of knee    severe, bilat  . Renal insufficiency   . Restless legs syndrome (RLS)   . Sleep disturbance, unspecified   . Spinal stenosis, lumbar region, without neurogenic claudication     Past Surgical History:  Procedure Laterality Date  . CATARACT EXTRACTION, BILATERAL  2011   Dr. Thomasene Ripple  . FINGER SURGERY     Right 4th finger  . [facility]  10/2009   Dr. Bary Castilla  . LUMBAR SPINE SURGERY  06/2005   stenosis repair  . TONSILLECTOMY  childhood  . TOTAL KNEE ARTHROPLASTY  06/2008   bilat (Dr. Marry Guan)    Social History   Social History  . Marital status: Widowed    Spouse name: N/A  . Number of children: 2  . Years of education: N/A   Occupational History  . Retired    Social History Main Topics  . Smoking status: Former Smoker  Quit date: 02/28/1970  . Smokeless tobacco: Never Used  . Alcohol use 0.0 oz/week     Comment: 1-2 glasses wine/daily  . Drug use: No  . Sexual activity: Not on file   Other Topics Concern  . Not on file   Social History Narrative   Widowed 2010   2 adopted children   Has living will   Son is health care power of attorney   Would accept resuscitation but no prolonged ventilation   No tube feeds if cognitively unaware    Family History  Problem Relation Age of Onset  . Other Father     "clogged arteries"  . Hypertension Mother     Allergies  Allergen Reactions  .  Atorvastatin     REACTION: raises blood pressure  . Pravastatin Sodium     REACTION: raises blood pressure  . Statins     REACTION: raises bp    Medication list reviewed and updated in full in [place].  GEN: No fevers, chills. Nontoxic. Primarily MSK c/o today. MSK: Detailed in the HPI GI: tolerating PO intake without difficulty Neuro: No numbness, parasthesias, or tingling associated. Otherwise the pertinent positives of the ROS are noted above.   Objective:   BP 140/90   Pulse 67   Temp 97.5 F (36.4 C) (Oral)   Ht 5' 6.25" (1.683 m)   Wt 190 lb 12 oz (86.5 kg)   BMI 30.56 kg/m    GEN: WDWN, NAD, Non-toxic, Alert & Oriented x 3 HEENT: Atraumatic, Normocephalic.  Ears and Nose: No external deformity. EXTR: No clubbing/cyanosis/edema NEURO: mildly antalgic gait.  PSYCH: Normally interactive. Conversant. Not depressed or anxious appearing.  Calm demeanor.    No significant tenderness in the true wrist or at the distal radius and all not. No significant tenderness along all of the metacarpals. Nontender first and second digits along with third digit in the left hand. Fifth digit is also nontender. Primarily swelling and tenderness is around the PIP joint on the fourth. DIP is nontender. Extension and flexion are preserved.  Remarkably swollen ; right knee with diffuse bruising and hematoma as well as traumatic bursitis.   Radiology: Dg Finger Ring Left  Result Date: 11/30/2015 CLINICAL DATA:  Status post fall on September 29th with persistent pain and swelling at the PIP joint of the fourth finger EXAM: LEFT RING FINGER 2+V COMPARISON:  None in PACs FINDINGS: The bones are subjectively osteopenic. No acute fracture nor dislocation is observed. The interphalangeal joint spaces are preserved. There is soft tissue swelling centered over the PIP joint of the fourth finger. The fourth MCP joint is unremarkable. IMPRESSION: No acute bony abnormality of the left ring finger  is observed. There is soft tissue swelling centered over the PIP joint. Electronically Signed   By: David  Martinique M.D.   On: 11/30/2015 09:54     Assessment and Plan:   Finger pain, left - Plan: DG Finger Ring Left  Acute pain of left knee  Traumatic hematoma of knee, right, subsequent encounter   I reassured him about his finger. There is no fracture. Recommended icing and active range of motion.  Left knee pain, traumatic hematoma. He does have follow-up with his joint replacement surgeon, and I reassured him and also counseled him to ice his knee 3 or 4 times a day along with compression.  Patient Instructions  Ice knee at least 3-4 times a day, 20 minutes a time.  Compression on the knee most of  the time.  OK to wear compression at night until Wed or Thursday Compression sleeve or an ACE bandage is ok.    Follow-up: prn  Orders Placed This Encounter  Procedures  . DG Finger Ring Left    Signed,  Kebra Lowrimore T. Casyn Becvar, MD   Patient's Medications  New Prescriptions   No medications on file  Previous Medications   ASPIRIN 81 MG TABLET    Take 81 mg by mouth every other day.    BUPROPION (WELLBUTRIN XL) 150 MG 24 HR TABLET    TAKE ONE TABLET BY MOUTH EVERY DAY   CLONAZEPAM (KLONOPIN) 0.5 MG TABLET    TAKE 1 OR 2 TABLETS BY MOUTH AT BEDTIME   DOXAZOSIN (CARDURA) 8 MG TABLET    TAKE ONE TABLET BY MOUTH EVERY DAY   FINASTERIDE (PROSCAR) 5 MG TABLET    TAKE ONE TABLET BY MOUTH EVERY DAY   FISH OIL-OMEGA-3 FATTY ACIDS 1000 MG CAPSULE    Take 1 g by mouth every other day.    GLUCOSAMINE-CHONDROITIN (GLUCOSAMINE CHONDR COMPLEX PO)    Take by mouth daily.   LOSARTAN-HYDROCHLOROTHIAZIDE (HYZAAR) 100-25 MG TABLET    TAKE ONE TABLET BY MOUTH EVERY DAY   MULTIPLE VITAMIN (MULTIVITAMIN) CAPSULE    Take 1 capsule by mouth daily.     OMEPRAZOLE (PRILOSEC) 40 MG CAPSULE    Take 1 capsule (40 mg total) by mouth daily.   PRAMIPEXOLE (MIRAPEX) 1 MG TABLET    TAKE 1 OR 2 TABLETS AT BEDTIME  AS NEEDED   PYRIDOXINE (B-6) 100 MG TABLET    Take 100 mg by mouth daily.  Modified Medications   No medications on file  Discontinued Medications   DOXAZOSIN (CARDURA) 8 MG TABLET    Take 4 mg by mouth daily.   TETANUS & DIPHTHERIA TOXOIDS, ADULT, (TENIVAC) 5-2 LFU INJECTION    Inject 0.5 mLs into the muscle once.

## 2015-11-30 NOTE — Progress Notes (Signed)
Pre visit review using our clinic review tool, if applicable. No additional management support is needed unless otherwise documented below in the visit note. 

## 2015-12-03 ENCOUNTER — Encounter: Payer: Self-pay | Admitting: Family Medicine

## 2015-12-03 DIAGNOSIS — Z08 Encounter for follow-up examination after completed treatment for malignant neoplasm: Secondary | ICD-10-CM | POA: Diagnosis not present

## 2015-12-03 DIAGNOSIS — L821 Other seborrheic keratosis: Secondary | ICD-10-CM | POA: Diagnosis not present

## 2015-12-03 DIAGNOSIS — Z85820 Personal history of malignant melanoma of skin: Secondary | ICD-10-CM | POA: Diagnosis not present

## 2015-12-03 DIAGNOSIS — L570 Actinic keratosis: Secondary | ICD-10-CM | POA: Diagnosis not present

## 2015-12-03 DIAGNOSIS — Z85828 Personal history of other malignant neoplasm of skin: Secondary | ICD-10-CM | POA: Diagnosis not present

## 2015-12-03 DIAGNOSIS — X32XXXA Exposure to sunlight, initial encounter: Secondary | ICD-10-CM | POA: Diagnosis not present

## 2015-12-06 ENCOUNTER — Encounter: Payer: Self-pay | Admitting: Family Medicine

## 2015-12-07 ENCOUNTER — Encounter: Payer: Self-pay | Admitting: Family Medicine

## 2015-12-08 ENCOUNTER — Encounter: Payer: Self-pay | Admitting: Internal Medicine

## 2015-12-10 ENCOUNTER — Other Ambulatory Visit: Payer: Self-pay | Admitting: Pediatric Gastroenterology

## 2015-12-10 ENCOUNTER — Ambulatory Visit
Admission: RE | Admit: 2015-12-10 | Discharge: 2015-12-10 | Disposition: A | Discharge status: Home / Self Care | Payer: Medicare Other | Source: Ambulatory Visit | Attending: Pediatric Gastroenterology | Admitting: Pediatric Gastroenterology

## 2015-12-10 DIAGNOSIS — M7989 Other specified soft tissue disorders: Secondary | ICD-10-CM | POA: Diagnosis not present

## 2015-12-10 DIAGNOSIS — S8001XA Contusion of right knee, initial encounter: Secondary | ICD-10-CM | POA: Diagnosis not present

## 2015-12-10 DIAGNOSIS — M25561 Pain in right knee: Secondary | ICD-10-CM | POA: Diagnosis not present

## 2015-12-15 ENCOUNTER — Encounter: Payer: Self-pay | Admitting: Internal Medicine

## 2015-12-16 NOTE — Telephone Encounter (Signed)
Please call him about setting up an appointment

## 2015-12-17 ENCOUNTER — Encounter: Payer: Self-pay | Admitting: Internal Medicine

## 2015-12-17 ENCOUNTER — Ambulatory Visit (INDEPENDENT_AMBULATORY_CARE_PROVIDER_SITE_OTHER): Payer: Medicare Other | Admitting: Internal Medicine

## 2015-12-17 VITALS — BP 138/86 | HR 68 | Temp 97.3°F | Wt 190.0 lb

## 2015-12-17 DIAGNOSIS — S8001XS Contusion of right knee, sequela: Secondary | ICD-10-CM | POA: Diagnosis not present

## 2015-12-17 DIAGNOSIS — S8001XA Contusion of right knee, initial encounter: Secondary | ICD-10-CM | POA: Insufficient documentation

## 2015-12-17 NOTE — Assessment & Plan Note (Signed)
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

## 2015-12-17 NOTE — Progress Notes (Signed)
Subjective:    Patient ID: Stephen Garrett, male    DOB: 1923-09-21, 82 y.o.   MRN: [id number]  HPI Here with friend [name]  Slipped on tennis court 3 weeks ago tomorrow Came down on right knee and left 4th finger Able to play for 5 minutes--then went right to [facility] Knee was massively swollen-- x-rays okay  Saw Dr Lorelei Pont about the finger X-ray shows no fracture Swelling has improved considerable for 2 weeks--but not much change in past week He stopped fish oil and ASA---hoping that might help Keeping ACE wrap on  Icing 3-4 times a day still  Walking with cane Does okay if he keeps the knee straight--pain with bending Trouble getting in and out of car  Current Outpatient Prescriptions on File Prior to Visit  Medication Sig Dispense Refill  . buPROPion (WELLBUTRIN XL) 150 MG 24 hr tablet TAKE ONE TABLET BY MOUTH EVERY DAY 90 tablet 3  . clonazePAM (KLONOPIN) 0.5 MG tablet TAKE 1 OR 2 TABLETS BY MOUTH AT BEDTIME 120 tablet 0  . doxazosin (CARDURA) 8 MG tablet TAKE ONE TABLET BY MOUTH EVERY DAY 90 tablet 2  . finasteride (PROSCAR) 5 MG tablet TAKE ONE TABLET BY MOUTH EVERY DAY 90 tablet 2  . Glucosamine-Chondroitin (GLUCOSAMINE CHONDR COMPLEX PO) Take by mouth daily.    Marland Kitchen losartan-hydrochlorothiazide (HYZAAR) 100-25 MG tablet TAKE ONE TABLET BY MOUTH EVERY DAY 90 tablet 3  . Multiple Vitamin (MULTIVITAMIN) capsule Take 1 capsule by mouth daily.      Marland Kitchen omeprazole (PRILOSEC) 40 MG capsule Take 1 capsule (40 mg total) by mouth daily. 90 capsule 3  . pramipexole (MIRAPEX) 1 MG tablet TAKE 1 OR 2 TABLETS AT BEDTIME AS NEEDED 180 tablet 3  . pyridoxine (B-6) 100 MG tablet Take 100 mg by mouth daily.    Marland Kitchen aspirin 81 MG tablet Take 81 mg by mouth every other day.     . fish oil-omega-3 fatty acids 1000 MG capsule Take 1 g by mouth every other day.     . [DISCONTINUED] citalopram (CELEXA) 10 MG tablet Take 1 tablet (10 mg total) by mouth daily. 30 tablet 5   No current  facility-administered medications on file prior to visit.     Allergies  Allergen Reactions  . Atorvastatin     REACTION: raises blood pressure  . Pravastatin Sodium     REACTION: raises blood pressure  . Statins     REACTION: raises bp    Past Medical History:  Diagnosis Date  . Actinic keratosis   . BPH (benign prostatic hypertrophy)   . Diseases of lips   . Diverticulosis of colon 06/2003  . ED (erectile dysfunction)   . GERD (gastroesophageal reflux disease)   . Heart murmur    as child  . HLD (hyperlipidemia)   . HTN (hypertension)   . Osteoarthritis of knee    severe, bilat  . Renal insufficiency   . Restless legs syndrome (RLS)   . Sleep disturbance, unspecified   . Spinal stenosis, lumbar region, without neurogenic claudication     Past Surgical History:  Procedure Laterality Date  . CATARACT EXTRACTION, BILATERAL  2011   Dr. Thomasene Ripple  . FINGER SURGERY     Right 4th finger  . [facility]  10/2009   Dr. Bary Castilla  . LUMBAR SPINE SURGERY  06/2005   stenosis repair  . TONSILLECTOMY  childhood  . TOTAL KNEE ARTHROPLASTY  06/2008   bilat (Dr. Marry Guan)    Family  History  Problem Relation Age of Onset  . Other Father     "clogged arteries"  . Hypertension Mother     Social History   Social History  . Marital status: Widowed    Spouse name: N/A  . Number of children: 2  . Years of education: N/A   Occupational History  . Retired    Social History Main Topics  . Smoking status: Former Smoker    Quit date: 02/28/1970  . Smokeless tobacco: Never Used  . Alcohol use 0.0 oz/week     Comment: 1-2 glasses wine/daily  . Drug use: No  . Sexual activity: Not on file   Other Topics Concern  . Not on file   Social History Narrative   Widowed 2010   2 adopted children   Has living will   Son is health care power of attorney   Would accept resuscitation but no prolonged ventilation   No tube feeds if cognitively unaware   Review of Systems  Not sick Appetite  remains fine No pain meds for this     Objective:   Physical Exam  Constitutional: He appears well-nourished. No distress.  Musculoskeletal:  Left 4th finger extends just short of fully Mild PIP swelling but not really tender  Right knee still significantly swollen--- blood anteriorly with some calf swelling (without tenderness) down towards ankle (but not all the way) Flexion only ~45 degrees but does extend fairly fully Significant bruising in right forefoot          Assessment & Plan:

## 2015-12-17 NOTE — Progress Notes (Signed)
Pre visit review using our clinic review tool, if applicable. No additional management support is needed unless otherwise documented below in the visit note. 

## 2015-12-18 ENCOUNTER — Encounter: Payer: Self-pay | Admitting: Internal Medicine

## 2015-12-24 ENCOUNTER — Encounter: Payer: Self-pay | Admitting: Internal Medicine

## 2015-12-24 DIAGNOSIS — Z23 Encounter for immunization: Secondary | ICD-10-CM | POA: Diagnosis not present

## 2015-12-26 DIAGNOSIS — S8001XD Contusion of right knee, subsequent encounter: Secondary | ICD-10-CM | POA: Diagnosis not present

## 2015-12-30 ENCOUNTER — Encounter: Payer: Self-pay | Admitting: Internal Medicine

## 2016-01-01 DIAGNOSIS — H34832 Tributary (branch) retinal vein occlusion, left eye, with macular edema: Secondary | ICD-10-CM | POA: Diagnosis not present

## 2016-01-09 ENCOUNTER — Encounter: Payer: Self-pay | Admitting: Internal Medicine

## 2016-01-11 ENCOUNTER — Telehealth: Payer: Self-pay | Admitting: Internal Medicine

## 2016-01-11 ENCOUNTER — Other Ambulatory Visit: Payer: Self-pay | Admitting: Internal Medicine

## 2016-01-11 NOTE — Telephone Encounter (Signed)
Pt need referral for physical therapy . Pt has appt tues at 230. Pt is being seen for  swollen right knee  Cherlyn Roberts - works for sports medicine [phone] fax [phone]   Pt call beck number is [phone]

## 2016-01-12 ENCOUNTER — Encounter: Payer: Self-pay | Admitting: Internal Medicine

## 2016-01-12 DIAGNOSIS — M25551 Pain in right hip: Secondary | ICD-10-CM

## 2016-01-12 DIAGNOSIS — M25561 Pain in right knee: Secondary | ICD-10-CM | POA: Diagnosis not present

## 2016-01-12 DIAGNOSIS — M25661 Stiffness of right knee, not elsewhere classified: Secondary | ICD-10-CM | POA: Diagnosis not present

## 2016-01-12 DIAGNOSIS — M25461 Effusion, right knee: Secondary | ICD-10-CM | POA: Diagnosis not present

## 2016-01-12 NOTE — Telephone Encounter (Signed)
Order faxed.

## 2016-01-12 NOTE — Telephone Encounter (Signed)
Order written. Please fax

## 2016-01-13 ENCOUNTER — Ambulatory Visit (INDEPENDENT_AMBULATORY_CARE_PROVIDER_SITE_OTHER): Payer: Self-pay | Admitting: Internal Medicine

## 2016-01-13 ENCOUNTER — Ambulatory Visit (INDEPENDENT_AMBULATORY_CARE_PROVIDER_SITE_OTHER)
Admission: RE | Admit: 2016-01-13 | Discharge: 2016-01-13 | Disposition: A | Discharge status: Home / Self Care | Payer: Medicare Other | Source: Ambulatory Visit | Attending: Internal Medicine | Admitting: Internal Medicine

## 2016-01-13 ENCOUNTER — Encounter: Payer: Self-pay | Admitting: Internal Medicine

## 2016-01-13 DIAGNOSIS — M1611 Unilateral primary osteoarthritis, right hip: Secondary | ICD-10-CM | POA: Diagnosis not present

## 2016-01-13 DIAGNOSIS — M25551 Pain in right hip: Secondary | ICD-10-CM

## 2016-01-13 NOTE — Telephone Encounter (Signed)
Please call him to set up a time to get this x-ray

## 2016-01-14 DIAGNOSIS — M25561 Pain in right knee: Secondary | ICD-10-CM | POA: Diagnosis not present

## 2016-01-14 DIAGNOSIS — M25661 Stiffness of right knee, not elsewhere classified: Secondary | ICD-10-CM | POA: Diagnosis not present

## 2016-01-14 DIAGNOSIS — M25461 Effusion, right knee: Secondary | ICD-10-CM | POA: Diagnosis not present

## 2016-01-14 NOTE — Progress Notes (Signed)
Erroneous encounter

## 2016-01-18 DIAGNOSIS — M25661 Stiffness of right knee, not elsewhere classified: Secondary | ICD-10-CM | POA: Diagnosis not present

## 2016-01-18 DIAGNOSIS — M25461 Effusion, right knee: Secondary | ICD-10-CM | POA: Diagnosis not present

## 2016-01-18 DIAGNOSIS — M25561 Pain in right knee: Secondary | ICD-10-CM | POA: Diagnosis not present

## 2016-01-19 DIAGNOSIS — M79645 Pain in left finger(s): Secondary | ICD-10-CM | POA: Diagnosis not present

## 2016-01-19 DIAGNOSIS — M20022 Boutonniere deformity of left finger(s): Secondary | ICD-10-CM | POA: Diagnosis not present

## 2016-01-23 ENCOUNTER — Encounter: Payer: Self-pay | Admitting: Internal Medicine

## 2016-01-27 DIAGNOSIS — M79645 Pain in left finger(s): Secondary | ICD-10-CM | POA: Diagnosis not present

## 2016-01-27 DIAGNOSIS — M20022 Boutonniere deformity of left finger(s): Secondary | ICD-10-CM | POA: Diagnosis not present

## 2016-01-29 DIAGNOSIS — L03115 Cellulitis of right lower limb: Secondary | ICD-10-CM | POA: Diagnosis not present

## 2016-02-02 ENCOUNTER — Encounter: Payer: Self-pay | Admitting: Internal Medicine

## 2016-02-02 DIAGNOSIS — L03115 Cellulitis of right lower limb: Secondary | ICD-10-CM | POA: Diagnosis not present

## 2016-02-09 DIAGNOSIS — M20022 Boutonniere deformity of left finger(s): Secondary | ICD-10-CM | POA: Diagnosis not present

## 2016-02-09 DIAGNOSIS — M79645 Pain in left finger(s): Secondary | ICD-10-CM | POA: Diagnosis not present

## 2016-02-24 ENCOUNTER — Ambulatory Visit (INDEPENDENT_AMBULATORY_CARE_PROVIDER_SITE_OTHER): Payer: Medicare Other | Admitting: Internal Medicine

## 2016-02-24 ENCOUNTER — Encounter: Payer: Self-pay | Admitting: Internal Medicine

## 2016-02-24 VITALS — BP 150/88 | HR 68 | Wt 191.0 lb

## 2016-02-24 DIAGNOSIS — R1031 Right lower quadrant pain: Secondary | ICD-10-CM | POA: Insufficient documentation

## 2016-02-24 NOTE — Progress Notes (Signed)
Pre visit review using our clinic review tool, if applicable. No additional management support is needed unless otherwise documented below in the visit note. 

## 2016-02-24 NOTE — Progress Notes (Signed)
Subjective:    Patient ID: Stephen Garrett, male    DOB: [birth year], 82 y.o.   MRN: [id number]  HPI Here due to groin pain He thinks he injured it during fall that he had in September Was running on tennis court and fell onto right knee--doesn't think he twisted his leg Knee is better--continues in follow up with Dr Marry Guan  Noticed the pain in groin close to that time-- but seemed to be overshadowed by knee He is okay standing and can even jog If he abducts or externally rotates right hip--it causes the pain Has tried heat--no help. Ice made it worse Seems to be worsening Has limited his HEP Difficulty getting out of chair and into car  Current Outpatient Prescriptions on File Prior to Visit  Medication Sig Dispense Refill  . aspirin 81 MG tablet Take 81 mg by mouth every other day.     Marland Kitchen buPROPion (WELLBUTRIN XL) 150 MG 24 hr tablet TAKE ONE TABLET BY MOUTH EVERY DAY 90 tablet 3  . clonazePAM (KLONOPIN) 0.5 MG tablet TAKE 1 OR 2 TABLETS BY MOUTH AT BEDTIME 120 tablet 0  . finasteride (PROSCAR) 5 MG tablet TAKE ONE TABLET BY MOUTH EVERY DAY 90 tablet 2  . Glucosamine-Chondroitin (GLUCOSAMINE CHONDR COMPLEX PO) Take by mouth daily.    Marland Kitchen losartan-hydrochlorothiazide (HYZAAR) 100-25 MG tablet TAKE ONE TABLET BY MOUTH EVERY DAY 90 tablet 3  . Multiple Vitamin (MULTIVITAMIN) capsule Take 1 capsule by mouth daily.      Marland Kitchen omeprazole (PRILOSEC) 40 MG capsule Take 1 capsule (40 mg total) by mouth daily. 90 capsule 3  . pramipexole (MIRAPEX) 1 MG tablet TAKE 1 OR 2 TABLETS AT BEDTIME AS NEEDED 180 tablet 3  . pyridoxine (B-6) 100 MG tablet Take 100 mg by mouth daily.    . [DISCONTINUED] citalopram (CELEXA) 10 MG tablet Take 1 tablet (10 mg total) by mouth daily. 30 tablet 5   No current facility-administered medications on file prior to visit.     Allergies  Allergen Reactions  . Atorvastatin     REACTION: raises blood pressure  . Pravastatin Sodium     REACTION: raises blood pressure    . Statins     REACTION: raises bp    Past Medical History:  Diagnosis Date  . Actinic keratosis   . BPH (benign prostatic hypertrophy)   . Diseases of lips   . Diverticulosis of colon 06/2003  . ED (erectile dysfunction)   . GERD (gastroesophageal reflux disease)   . Heart murmur    as child  . HLD (hyperlipidemia)   . HTN (hypertension)   . Osteoarthritis of knee    severe, bilat  . Renal insufficiency   . Restless legs syndrome (RLS)   . Sleep disturbance, unspecified   . Spinal stenosis, lumbar region, without neurogenic claudication     Past Surgical History:  Procedure Laterality Date  . CATARACT EXTRACTION, BILATERAL  2011   Dr. Thomasene Ripple  . FINGER SURGERY     Right 4th finger  . [facility]  10/2009   Dr. Bary Castilla  . LUMBAR SPINE SURGERY  06/2005   stenosis repair  . TONSILLECTOMY  childhood  . TOTAL KNEE ARTHROPLASTY  06/2008   bilat (Dr. Marry Guan)    Family History  Problem Relation Age of Onset  . Other Father     "clogged arteries"  . Hypertension Mother     Social History   Social History  . Marital status: Widowed  Spouse name: N/A  . Number of children: 2  . Years of education: N/A   Occupational History  . Retired    Social History Main Topics  . Smoking status: Former Smoker    Quit date: 02/28/1970  . Smokeless tobacco: Never Used  . Alcohol use 0.0 oz/week     Comment: 1-2 glasses wine/daily  . Drug use: No  . Sexual activity: Not on file   Other Topics Concern  . Not on file   Social History Narrative   Widowed 2010   2 adopted children   Has living will   Son is health care power of attorney   Would accept resuscitation but no prolonged ventilation   No tube feeds if cognitively unaware   Review of Systems  Cellulitis in leg is better No fever Not been sick     Objective:   Physical Exam  Genitourinary:  Genitourinary Comments: No hernia or scrotal findings  Musculoskeletal:  Fair ROM in right hip---but external and  internal rotation cause pain over medial thigh. No actual tenderness in hip or adductor area No back pain  Neurological:  Normal gait and strength in legs          Assessment & Plan:

## 2016-02-24 NOTE — Patient Instructions (Signed)
Please limit activities that cause the groin pain--as much as possible. We can consider an evaluation by sports medicine if you have ongoing problems.

## 2016-02-24 NOTE — Assessment & Plan Note (Signed)
Not clear cut May be adductor strain but the pain seems to be with hip movement May be combination of them Recommended he review this with Dr Marry Guan at his upcoming appointment. May consider sports medicine evaluation

## 2016-03-01 ENCOUNTER — Other Ambulatory Visit: Payer: Self-pay | Admitting: Internal Medicine

## 2016-03-01 DIAGNOSIS — Z96652 Presence of left artificial knee joint: Secondary | ICD-10-CM | POA: Diagnosis not present

## 2016-03-01 DIAGNOSIS — M25551 Pain in right hip: Secondary | ICD-10-CM | POA: Diagnosis not present

## 2016-03-01 DIAGNOSIS — G8929 Other chronic pain: Secondary | ICD-10-CM | POA: Diagnosis not present

## 2016-03-01 DIAGNOSIS — Z96651 Presence of right artificial knee joint: Secondary | ICD-10-CM | POA: Diagnosis not present

## 2016-03-01 NOTE — Telephone Encounter (Signed)
Approved: #[id number] x 0 

## 2016-03-01 NOTE — Telephone Encounter (Signed)
Verbal refill given to [facility] at pharmacy

## 2016-03-01 NOTE — Telephone Encounter (Signed)
Last filled 11-10-15 #120 Last OV 02-24-16 Next OV 05-31-16

## 2016-03-03 DIAGNOSIS — M20022 Boutonniere deformity of left finger(s): Secondary | ICD-10-CM | POA: Diagnosis not present

## 2016-03-03 DIAGNOSIS — M79645 Pain in left finger(s): Secondary | ICD-10-CM | POA: Diagnosis not present

## 2016-03-11 DIAGNOSIS — H34832 Tributary (branch) retinal vein occlusion, left eye, with macular edema: Secondary | ICD-10-CM | POA: Diagnosis not present

## 2016-03-15 DIAGNOSIS — M20022 Boutonniere deformity of left finger(s): Secondary | ICD-10-CM | POA: Diagnosis not present

## 2016-03-15 DIAGNOSIS — M79645 Pain in left finger(s): Secondary | ICD-10-CM | POA: Diagnosis not present

## 2016-03-18 DIAGNOSIS — Z85820 Personal history of malignant melanoma of skin: Secondary | ICD-10-CM | POA: Diagnosis not present

## 2016-03-18 DIAGNOSIS — L570 Actinic keratosis: Secondary | ICD-10-CM | POA: Diagnosis not present

## 2016-03-18 DIAGNOSIS — D225 Melanocytic nevi of trunk: Secondary | ICD-10-CM | POA: Diagnosis not present

## 2016-03-18 DIAGNOSIS — D485 Neoplasm of uncertain behavior of skin: Secondary | ICD-10-CM | POA: Diagnosis not present

## 2016-03-18 DIAGNOSIS — C44629 Squamous cell carcinoma of skin of left upper limb, including shoulder: Secondary | ICD-10-CM | POA: Diagnosis not present

## 2016-03-18 DIAGNOSIS — Z85828 Personal history of other malignant neoplasm of skin: Secondary | ICD-10-CM | POA: Diagnosis not present

## 2016-03-18 DIAGNOSIS — X32XXXA Exposure to sunlight, initial encounter: Secondary | ICD-10-CM | POA: Diagnosis not present

## 2016-03-18 DIAGNOSIS — R208 Other disturbances of skin sensation: Secondary | ICD-10-CM | POA: Diagnosis not present

## 2016-03-18 DIAGNOSIS — Z08 Encounter for follow-up examination after completed treatment for malignant neoplasm: Secondary | ICD-10-CM | POA: Diagnosis not present

## 2016-03-30 DIAGNOSIS — M79645 Pain in left finger(s): Secondary | ICD-10-CM | POA: Diagnosis not present

## 2016-03-30 DIAGNOSIS — M20022 Boutonniere deformity of left finger(s): Secondary | ICD-10-CM | POA: Diagnosis not present

## 2016-04-07 DIAGNOSIS — Z96651 Presence of right artificial knee joint: Secondary | ICD-10-CM | POA: Diagnosis not present

## 2016-04-08 DIAGNOSIS — C44629 Squamous cell carcinoma of skin of left upper limb, including shoulder: Secondary | ICD-10-CM | POA: Diagnosis not present

## 2016-04-21 DIAGNOSIS — M20022 Boutonniere deformity of left finger(s): Secondary | ICD-10-CM | POA: Diagnosis not present

## 2016-04-29 DIAGNOSIS — H34832 Tributary (branch) retinal vein occlusion, left eye, with macular edema: Secondary | ICD-10-CM | POA: Diagnosis not present

## 2016-05-30 ENCOUNTER — Other Ambulatory Visit: Payer: Self-pay | Admitting: Internal Medicine

## 2016-05-30 NOTE — Telephone Encounter (Signed)
Approved: #[id number] x 0 

## 2016-05-30 NOTE — Telephone Encounter (Signed)
Last filled 03-01-16 #120 Last OV Acute 02-24-16 Next OV tomorrow

## 2016-05-30 NOTE — Telephone Encounter (Signed)
Left refill on voice mail at pharmacy  

## 2016-05-31 ENCOUNTER — Ambulatory Visit (INDEPENDENT_AMBULATORY_CARE_PROVIDER_SITE_OTHER): Payer: Medicare Other | Admitting: Internal Medicine

## 2016-05-31 ENCOUNTER — Encounter: Payer: Self-pay | Admitting: Internal Medicine

## 2016-05-31 VITALS — BP 136/90 | HR 67 | Temp 96.9°F | Ht 67.0 in | Wt 190.0 lb

## 2016-05-31 DIAGNOSIS — N183 Chronic kidney disease, stage 3 unspecified: Secondary | ICD-10-CM

## 2016-05-31 DIAGNOSIS — G2581 Restless legs syndrome: Secondary | ICD-10-CM | POA: Diagnosis not present

## 2016-05-31 DIAGNOSIS — N138 Other obstructive and reflux uropathy: Secondary | ICD-10-CM | POA: Diagnosis not present

## 2016-05-31 DIAGNOSIS — I10 Essential (primary) hypertension: Secondary | ICD-10-CM

## 2016-05-31 DIAGNOSIS — K219 Gastro-esophageal reflux disease without esophagitis: Secondary | ICD-10-CM

## 2016-05-31 DIAGNOSIS — Z0000 Encounter for general adult medical examination without abnormal findings: Secondary | ICD-10-CM

## 2016-05-31 DIAGNOSIS — N401 Enlarged prostate with lower urinary tract symptoms: Secondary | ICD-10-CM

## 2016-05-31 DIAGNOSIS — F39 Unspecified mood [affective] disorder: Secondary | ICD-10-CM | POA: Diagnosis not present

## 2016-05-31 NOTE — Assessment & Plan Note (Signed)
Okay on Rx

## 2016-05-31 NOTE — Assessment & Plan Note (Signed)
Quiet with PPI

## 2016-05-31 NOTE — Assessment & Plan Note (Signed)
I have personally reviewed the Medicare Annual Wellness questionnaire and have noted 1. The patient's medical and social history 2. Their use of alcohol, tobacco or illicit drugs 3. Their current medications and supplements 4. The patient's functional ability including ADL's, fall risks, home safety risks and hearing or visual             impairment. 5. Diet and physical activities 6. Evidence for depression or mood disorders  The patients weight, height, BMI and visual acuity have been recorded in the chart I have made referrals, counseling and provided education to the patient based review of the above and I have provided the pt with a written personalized care plan for preventive services.  I have provided you with a copy of your personalized plan for preventive services. Please take the time to review along with your updated medication list.  Flu vaccine yearly Offered pneumovax update---he wants to wait Stays active No cancer screening due to age

## 2016-05-31 NOTE — Assessment & Plan Note (Signed)
Doing okay on dual therapy

## 2016-05-31 NOTE — Progress Notes (Signed)
Pre visit review using our clinic review tool, if applicable. No additional management support is needed unless otherwise documented below in the visit note. 

## 2016-05-31 NOTE — Progress Notes (Signed)
Subjective:    Patient ID: Stephen Garrett, male    DOB: 1923/11/25, 83 y.o.   MRN: [id number]  HPI Here for Medicare wellness and follow up of chronic health conditions Reviewed form and advanced directives Reviewed other doctors No tobacco Occasional drink of wine Tries to exercise regularly Vision is limited in left eye--gets shots regularly at [employer] poor---uses aides Memory is okay---no major change Independent with instrumental ADLs  Did have fall Out of tennis for 3.5 months due to hematoma in knee Now back to tennis Was getting OT for finger--this is better  Mood usually good Son with painful disease--this causes some sadness at times Not anhedonic Still in relationship Uses the clonazepam for sleep---helps him settle down  Using mirapex for the RLS This seems to be effective with the clonazepam  Urinary flow is slow--but stable Nocturia x 3--stable No major daytime problems  No chest pain No SOB Slight edema at times---nothing persistent No palpitations No dizziness or syncope  Takes prilosec No heartburn or dysphagia on this  Current Outpatient Prescriptions on File Prior to Visit  Medication Sig Dispense Refill  . buPROPion (WELLBUTRIN XL) 150 MG 24 hr tablet TAKE ONE TABLET BY MOUTH EVERY DAY 90 tablet 3  . clonazePAM (KLONOPIN) 0.5 MG tablet TAKE 1 OR 2 TABLETS BY MOUTH AT BEDTIME 120 tablet 0  . doxazosin (CARDURA) 4 MG tablet Take 4 mg by mouth daily.    . finasteride (PROSCAR) 5 MG tablet TAKE ONE TABLET BY MOUTH EVERY DAY 90 tablet 2  . Glucosamine-Chondroitin (GLUCOSAMINE CHONDR COMPLEX PO) Take by mouth daily.    Marland Kitchen losartan-hydrochlorothiazide (HYZAAR) 100-25 MG tablet TAKE ONE TABLET BY MOUTH EVERY DAY 90 tablet 3  . Multiple Vitamin (MULTIVITAMIN) capsule Take 1 capsule by mouth daily.      Marland Kitchen omeprazole (PRILOSEC) 40 MG capsule Take 1 capsule (40 mg total) by mouth daily. 90 capsule 3  . pramipexole (MIRAPEX) 1 MG tablet TAKE 1  OR 2 TABLETS AT BEDTIME AS NEEDED 180 tablet 3  . pyridoxine (B-6) 100 MG tablet Take 100 mg by mouth daily.    . [DISCONTINUED] citalopram (CELEXA) 10 MG tablet Take 1 tablet (10 mg total) by mouth daily. 30 tablet 5   No current facility-administered medications on file prior to visit.     Allergies  Allergen Reactions  . Atorvastatin     REACTION: raises blood pressure  . Pravastatin Sodium     REACTION: raises blood pressure  . Statins     REACTION: raises bp    Past Medical History:  Diagnosis Date  . Actinic keratosis   . BPH (benign prostatic hypertrophy)   . Diseases of lips   . Diverticulosis of colon 06/2003  . ED (erectile dysfunction)   . GERD (gastroesophageal reflux disease)   . Heart murmur    as child  . HLD (hyperlipidemia)   . HTN (hypertension)   . Osteoarthritis of knee    severe, bilat  . Renal insufficiency   . Restless legs syndrome (RLS)   . Sleep disturbance, unspecified   . Spinal stenosis, lumbar region, without neurogenic claudication     Past Surgical History:  Procedure Laterality Date  . CATARACT EXTRACTION, BILATERAL  2011   Dr. Thomasene Ripple  . FINGER SURGERY     Right 4th finger  . [facility]  10/2009   Dr. Bary Castilla  . LUMBAR SPINE SURGERY  06/2005   stenosis repair  . TONSILLECTOMY  childhood  .  TOTAL KNEE ARTHROPLASTY  06/2008   bilat (Dr. Marry Guan)    Family History  Problem Relation Age of Onset  . Other Father     "clogged arteries"  . Hypertension Mother     Social History   Social History  . Marital status: Widowed    Spouse name: N/A  . Number of children: 2  . Years of education: N/A   Occupational History  . Retired    Social History Main Topics  . Smoking status: Former Smoker    Quit date: 02/28/1970  . Smokeless tobacco: Never Used  . Alcohol use 0.0 oz/week     Comment: 1-2 glasses wine/daily  . Drug use: No  . Sexual activity: Not on file   Other Topics Concern  . Not on file   Social History Narrative    Widowed 2010   2 adopted children   Has living will   Son is health care power of attorney   Would accept resuscitation but no prolonged ventilation   No tube feeds if cognitively unaware   Review of Systems Appetite is fine Weight stable Wears seat belt Teeth are okay--regular with dentist ([place]) Bowels are fine--no blood No sig joint pains--despite known arthritis No rash or suspicious lesions---sees derm every 3 months ([place])    Objective:   Physical Exam  Constitutional: He is oriented to person, place, and time. He appears well-nourished. No distress.  HENT:  Mouth/Throat: Oropharynx is clear and moist. No oropharyngeal exudate.  Neck: No thyromegaly present.  Cardiovascular: Normal rate, regular rhythm, normal heart sounds and intact distal pulses.  Exam reveals no gallop.   No murmur heard. Pulmonary/Chest: Effort normal and breath sounds normal. No respiratory distress. He has no wheezes. He has no rales.  Abdominal: Soft. There is no tenderness.  Musculoskeletal: He exhibits no edema or tenderness.  Lymphadenopathy:    He has no cervical adenopathy.  Neurological: He is alert and oriented to person, place, and time.  President--- "Trump, ?" [id number] Recall 3/3  Skin: No rash noted. No erythema.  Psychiatric: He has a normal mood and affect. His behavior is normal.          Assessment & Plan:

## 2016-05-31 NOTE — Assessment & Plan Note (Signed)
Some dysthymia but nothing that needs Rx

## 2016-05-31 NOTE — Assessment & Plan Note (Signed)
BP Readings from Last 3 Encounters:  05/31/16 136/90  02/24/16 (!) 150/88  12/17/15 138/86   Reasonable control Due for labs

## 2016-05-31 NOTE — Assessment & Plan Note (Signed)
Stable On ARB Will recheck labs

## 2016-06-01 ENCOUNTER — Other Ambulatory Visit: Payer: BLUE CROSS/BLUE SHIELD

## 2016-06-01 ENCOUNTER — Other Ambulatory Visit (INDEPENDENT_AMBULATORY_CARE_PROVIDER_SITE_OTHER): Payer: Medicare Other

## 2016-06-01 DIAGNOSIS — I10 Essential (primary) hypertension: Secondary | ICD-10-CM | POA: Diagnosis not present

## 2016-06-01 LAB — CBC WITH DIFFERENTIAL/PLATELET
Basophils Absolute: 0 10*3/uL (ref 0.0–0.1)
Basophils Relative: 0.2 % (ref 0.0–3.0)
Eosinophils Absolute: 0.1 10*3/uL (ref 0.0–0.7)
Eosinophils Relative: 1.7 % (ref 0.0–5.0)
HCT: 39.3 % (ref 39.0–52.0)
Hemoglobin: 13.2 g/dL (ref 13.0–17.0)
Lymphocytes Relative: 25.6 % (ref 12.0–46.0)
Lymphs Abs: 1.7 10*3/uL (ref 0.7–4.0)
MCHC: 33.7 g/dL (ref 30.0–36.0)
MCV: 90 fl (ref 78.0–100.0)
Monocytes Absolute: 0.4 10*3/uL (ref 0.1–1.0)
Monocytes Relative: 6.6 % (ref 3.0–12.0)
Neutro Abs: 4.3 10*3/uL (ref 1.4–7.7)
Neutrophils Relative %: 65.9 % (ref 43.0–77.0)
Platelets: 175 10*3/uL (ref 150.0–400.0)
RBC: 4.36 Mil/uL (ref 4.22–5.81)
RDW: 15.1 % (ref 11.5–15.5)
WBC: 6.5 10*3/uL (ref 4.0–10.5)

## 2016-06-01 LAB — COMPREHENSIVE METABOLIC PANEL
ALT: 20 U/L (ref 0–53)
AST: 24 U/L (ref 0–37)
Albumin: 4 g/dL (ref 3.5–5.2)
Alkaline Phosphatase: 74 U/L (ref 39–117)
BUN: 28 mg/dL — ABNORMAL HIGH (ref 6–23)
CO2: 30 mEq/L (ref 19–32)
Calcium: 9.3 mg/dL (ref 8.4–10.5)
Chloride: 103 mEq/L (ref 96–112)
Creatinine, Ser: 1.61 mg/dL — ABNORMAL HIGH (ref 0.40–1.50)
GFR: 42.99 mL/min — ABNORMAL LOW (ref >60.00)
Glucose, Bld: 93 mg/dL (ref 70–99)
Potassium: 4 mEq/L (ref 3.5–5.1)
Sodium: 140 mEq/L (ref 135–145)
Total Bilirubin: 0.5 mg/dL (ref 0.2–1.2)
Total Protein: 6.3 g/dL (ref 6.0–8.3)

## 2016-06-07 DIAGNOSIS — M20022 Boutonniere deformity of left finger(s): Secondary | ICD-10-CM | POA: Diagnosis not present

## 2016-06-07 DIAGNOSIS — M79645 Pain in left finger(s): Secondary | ICD-10-CM | POA: Diagnosis not present

## 2016-06-08 ENCOUNTER — Encounter: Payer: Self-pay | Admitting: Internal Medicine

## 2016-06-09 MED ORDER — BUPROPION HCL ER (XL) 300 MG PO TB24: 300.0000 mg | ORAL_TABLET | Freq: Every day | ORAL | 1 refills | Status: DC

## 2016-06-17 DIAGNOSIS — H34832 Tributary (branch) retinal vein occlusion, left eye, with macular edema: Secondary | ICD-10-CM | POA: Diagnosis not present

## 2016-06-21 ENCOUNTER — Telehealth: Payer: Self-pay | Admitting: Internal Medicine

## 2016-06-21 NOTE — Telephone Encounter (Signed)
[facility]  Patient Name: Stephen Garrett  DOB: May 02, 1923    Initial Comment dizzy, light headed for few weeks on/off   Nurse Assessment  Nurse: Wynetta Emery RN, Baker Janus Date/Time (Eastern Time): 06/21/2016 8:29:21 AM  Confirm and document reason for call. If symptomatic, describe symptoms. ---[name] is having lightheaded and dizziness on two weeks started about the time he played two rounds of singles of tennis. headache and lightheadedness at this 150/85  Does the patient have any new or worsening symptoms? ---Yes  Will a triage be completed? ---Yes  Related visit to physician within the last 2 weeks? ---Yes  Does the PT have any chronic conditions? (i.e. diabetes, asthma, etc.) ---Unknown  Is this a behavioral health or substance abuse call? ---No     Guidelines    Guideline Title Affirmed Question Affirmed Notes  Dizziness - Lightheadedness [1] MODERATE dizziness (e.g., interferes with normal activities) AND [2] has NOT been evaluated by physician for this (Exception: dizziness caused by heat exposure, sudden standing, or poor fluid intake)    Final Disposition User   See Physician within 24 Hours Wynetta Emery, RN, Baker Janus    Comments  NOTE: Nurse made appt for 06/22/2016 1200 w/ Dr. Wilhemena Durie r/o lightheadness/dizzy w/headache x 2 week off/on   Referrals  REFERRED TO PCP OFFICE   Disagree/Comply: Comply

## 2016-06-21 NOTE — Telephone Encounter (Signed)
Will evaluate at the OV 

## 2016-06-21 NOTE — Telephone Encounter (Signed)
Pt has appt with Dr Silvio Pate 06/22/16 at 12  noon.

## 2016-06-22 ENCOUNTER — Ambulatory Visit (INDEPENDENT_AMBULATORY_CARE_PROVIDER_SITE_OTHER): Payer: Medicare Other | Admitting: Internal Medicine

## 2016-06-22 ENCOUNTER — Encounter: Payer: Self-pay | Admitting: Internal Medicine

## 2016-06-22 VITALS — BP 140/88 | HR 67 | Temp 97.1°F | Wt 185.0 lb

## 2016-06-22 DIAGNOSIS — R42 Dizziness and giddiness: Secondary | ICD-10-CM | POA: Diagnosis not present

## 2016-06-22 DIAGNOSIS — L570 Actinic keratosis: Secondary | ICD-10-CM | POA: Diagnosis not present

## 2016-06-22 DIAGNOSIS — Z85828 Personal history of other malignant neoplasm of skin: Secondary | ICD-10-CM | POA: Diagnosis not present

## 2016-06-22 DIAGNOSIS — Z08 Encounter for follow-up examination after completed treatment for malignant neoplasm: Secondary | ICD-10-CM | POA: Diagnosis not present

## 2016-06-22 DIAGNOSIS — X32XXXA Exposure to sunlight, initial encounter: Secondary | ICD-10-CM | POA: Diagnosis not present

## 2016-06-22 DIAGNOSIS — Z85820 Personal history of malignant melanoma of skin: Secondary | ICD-10-CM | POA: Diagnosis not present

## 2016-06-22 NOTE — Progress Notes (Signed)
Subjective:    Patient ID: Stephen Garrett, male    DOB: [birth year], 83 y.o.   MRN: [id number]  HPI Here due to 2 weeks of dizzy feelings  Started with symptoms after 1 hour of singles tennis play Not sure it had anything to do with that--has kept up with fluids Stopped asa ---due to excessive bruising or bleeding  "It feels a little different all the time" Today--up okay but then headache after walking a bit in house Lightheaded at times Doesn't notice it in bed--and probably not when sitting Will resolve on its own at times  Voiding okay Nocturia x 2-3 ---stable  Current Outpatient Prescriptions on File Prior to Visit  Medication Sig Dispense Refill  . buPROPion (WELLBUTRIN XL) 300 MG 24 hr tablet Take 1 tablet (300 mg total) by mouth daily. 30 tablet 1  . clonazePAM (KLONOPIN) 0.5 MG tablet TAKE 1 OR 2 TABLETS BY MOUTH AT BEDTIME 120 tablet 0  . doxazosin (CARDURA) 4 MG tablet Take 4 mg by mouth daily.    . finasteride (PROSCAR) 5 MG tablet TAKE ONE TABLET BY MOUTH EVERY DAY 90 tablet 2  . Glucosamine-Chondroitin (GLUCOSAMINE CHONDR COMPLEX PO) Take by mouth daily.    Marland Kitchen losartan-hydrochlorothiazide (HYZAAR) 100-25 MG tablet TAKE ONE TABLET BY MOUTH EVERY DAY 90 tablet 3  . Multiple Vitamin (MULTIVITAMIN) capsule Take 1 capsule by mouth daily.      Marland Kitchen omeprazole (PRILOSEC) 40 MG capsule Take 1 capsule (40 mg total) by mouth daily. 90 capsule 3  . pramipexole (MIRAPEX) 1 MG tablet TAKE 1 OR 2 TABLETS AT BEDTIME AS NEEDED 180 tablet 3  . pyridoxine (B-6) 100 MG tablet Take 100 mg by mouth daily.    . [DISCONTINUED] citalopram (CELEXA) 10 MG tablet Take 1 tablet (10 mg total) by mouth daily. 30 tablet 5   No current facility-administered medications on file prior to visit.     Allergies  Allergen Reactions  . Atorvastatin     REACTION: raises blood pressure  . Pravastatin Sodium     REACTION: raises blood pressure  . Statins     REACTION: raises bp    Past Medical  History:  Diagnosis Date  . Actinic keratosis   . BPH (benign prostatic hypertrophy)   . Diseases of lips   . Diverticulosis of colon 06/2003  . ED (erectile dysfunction)   . GERD (gastroesophageal reflux disease)   . Heart murmur    as child  . HLD (hyperlipidemia)   . HTN (hypertension)   . Osteoarthritis of knee    severe, bilat  . Renal insufficiency   . Restless legs syndrome (RLS)   . Sleep disturbance, unspecified   . Spinal stenosis, lumbar region, without neurogenic claudication     Past Surgical History:  Procedure Laterality Date  . CATARACT EXTRACTION, BILATERAL  2011   Dr. Thomasene Ripple  . FINGER SURGERY     Right 4th finger  . [facility]  10/2009   Dr. Bary Castilla  . LUMBAR SPINE SURGERY  06/2005   stenosis repair  . TONSILLECTOMY  childhood  . TOTAL KNEE ARTHROPLASTY  06/2008   bilat (Dr. Marry Guan)    Family History  Problem Relation Age of Onset  . Other Father     "clogged arteries"  . Hypertension Mother     Social History   Social History  . Marital status: Widowed    Spouse name: N/A  . Number of children: 2  . Years of education: N/A  Occupational History  . Retired    Social History Main Topics  . Smoking status: Former Smoker    Quit date: 02/28/1970  . Smokeless tobacco: Never Used  . Alcohol use 0.0 oz/week     Comment: 1-2 glasses wine/daily  . Drug use: No  . Sexual activity: Not on file   Other Topics Concern  . Not on file   Social History Narrative   Widowed 2010   2 adopted children   Has living will   Son is health care power of attorney   Would accept resuscitation but no prolonged ventilation   No tube feeds if cognitively unaware   Review of Systems Some constipation--- not bad enough for miralax Appetite is good Has lost some weight--by design Sleeping okay    Objective:   Physical Exam  Constitutional: He appears well-nourished. No distress.  Neck: No thyromegaly present.  Cardiovascular: Normal rate and regular  rhythm.  Exam reveals no gallop.   ?very slight murmur at base  Musculoskeletal:  Slight right calf swelling  Lymphadenopathy:    He has no cervical adenopathy.  Neurological: No cranial nerve deficit. Coordination normal.  Gait with small steps as usual but no ataxia          Assessment & Plan:

## 2016-06-22 NOTE — Assessment & Plan Note (Signed)
Recent but not clear cut Vague and varying symptoms No dehydration No neuro findings Will try off doxazosin---- consider flomax if BPH acts up

## 2016-06-22 NOTE — Patient Instructions (Signed)
Please stop the doxazosin. If the dizziness goes away, stay off it. If no change, go back on it. If you are off the doxazosin, and having more trouble passing urine, let me know and I will try you on another medication (flomax)

## 2016-06-22 NOTE — Progress Notes (Signed)
Pre visit review using our clinic review tool, if applicable. No additional management support is needed unless otherwise documented below in the visit note. 

## 2016-06-27 ENCOUNTER — Other Ambulatory Visit: Payer: Self-pay | Admitting: Internal Medicine

## 2016-06-28 ENCOUNTER — Encounter: Payer: Self-pay | Admitting: Internal Medicine

## 2016-07-01 DIAGNOSIS — I788 Other diseases of capillaries: Secondary | ICD-10-CM | POA: Diagnosis not present

## 2016-07-11 ENCOUNTER — Other Ambulatory Visit: Payer: Self-pay | Admitting: Internal Medicine

## 2016-07-18 ENCOUNTER — Encounter: Payer: Self-pay | Admitting: Internal Medicine

## 2016-07-22 DIAGNOSIS — H34832 Tributary (branch) retinal vein occlusion, left eye, with macular edema: Secondary | ICD-10-CM | POA: Diagnosis not present

## 2016-07-26 ENCOUNTER — Other Ambulatory Visit: Payer: Self-pay | Admitting: Internal Medicine

## 2016-08-08 ENCOUNTER — Telehealth: Payer: Self-pay | Admitting: Internal Medicine

## 2016-08-08 ENCOUNTER — Ambulatory Visit (INDEPENDENT_AMBULATORY_CARE_PROVIDER_SITE_OTHER): Payer: Medicare Other | Admitting: Internal Medicine

## 2016-08-08 ENCOUNTER — Encounter: Payer: Self-pay | Admitting: Internal Medicine

## 2016-08-08 VITALS — BP 180/110 | HR 66 | Temp 97.4°F | Wt 185.0 lb

## 2016-08-08 DIAGNOSIS — N183 Chronic kidney disease, stage 3 unspecified: Secondary | ICD-10-CM

## 2016-08-08 DIAGNOSIS — R42 Dizziness and giddiness: Secondary | ICD-10-CM

## 2016-08-08 DIAGNOSIS — I10 Essential (primary) hypertension: Secondary | ICD-10-CM | POA: Diagnosis not present

## 2016-08-08 DIAGNOSIS — N401 Enlarged prostate with lower urinary tract symptoms: Secondary | ICD-10-CM | POA: Diagnosis not present

## 2016-08-08 DIAGNOSIS — N138 Other obstructive and reflux uropathy: Secondary | ICD-10-CM

## 2016-08-08 LAB — RENAL FUNCTION PANEL
Albumin: 4.5 g/dL (ref 3.5–5.2)
BUN: 38 mg/dL — ABNORMAL HIGH (ref 6–23)
CO2: 30 mEq/L (ref 19–32)
Calcium: 10.3 mg/dL (ref 8.4–10.5)
Chloride: 103 mEq/L (ref 96–112)
Creatinine, Ser: 1.5 mg/dL (ref 0.40–1.50)
GFR: 46.63 mL/min — ABNORMAL LOW (ref >60.00)
Glucose, Bld: 109 mg/dL — ABNORMAL HIGH (ref 70–99)
Phosphorus: 3.4 mg/dL (ref 2.3–4.6)
Potassium: 5 mEq/L (ref 3.5–5.1)
Sodium: 140 mEq/L (ref 135–145)

## 2016-08-08 MED ORDER — AMLODIPINE BESYLATE 2.5 MG PO TABS: 2.5000 mg | ORAL_TABLET | Freq: Every day | ORAL | 1 refills | Status: DC

## 2016-08-08 NOTE — Progress Notes (Signed)
Subjective:    Patient ID: Stephen Garrett, male    DOB: 02-04-1924, 83 y.o.   MRN: [id number]  HPI Here due to not feeling right Played tennis in the sun 3 days ago [place] home and felt lightheaded and dizzy since then Played 2 hours--doubles Didn't drink that much while playing--but drank water upon getting home Played tennis 2 other days last week also  Constant dizzy sensation--does decline after sitting for a while (sometimes) No dizziness upon arising--recurs after walking a bit No chest pain No SOB No syncope  Checking BP ---"it has been crazy" Mostly high but occasionally 150/92 (lower) As high as 210/120  Current Outpatient Prescriptions on File Prior to Visit  Medication Sig Dispense Refill  . buPROPion (WELLBUTRIN XL) 150 MG 24 hr tablet TAKE ONE TABLET BY MOUTH EVERY DAY 90 tablet 2  . clonazePAM (KLONOPIN) 0.5 MG tablet TAKE 1 OR 2 TABLETS BY MOUTH AT BEDTIME 120 tablet 0  . Cyanocobalamin (VITAMIN B12 PO) Take by mouth.    . finasteride (PROSCAR) 5 MG tablet TAKE ONE TABLET BY MOUTH EVERY DAY 90 tablet 3  . Glucosamine-Chondroitin (GLUCOSAMINE CHONDR COMPLEX PO) Take by mouth daily.    Marland Kitchen losartan-hydrochlorothiazide (HYZAAR) 100-25 MG tablet TAKE ONE TABLET BY MOUTH EVERY DAY 90 tablet 3  . Multiple Vitamin (MULTIVITAMIN) capsule Take 1 capsule by mouth daily.      Marland Kitchen omeprazole (PRILOSEC) 40 MG capsule TAKE ONE CAPSULE BY MOUTH EVERY DAY 90 capsule 3  . pramipexole (MIRAPEX) 1 MG tablet TAKE 1 OR 2 TABLETS AT BEDTIME AS NEEDED 180 tablet 3  . pyridoxine (B-6) 100 MG tablet Take 100 mg by mouth daily.    . [DISCONTINUED] citalopram (CELEXA) 10 MG tablet Take 1 tablet (10 mg total) by mouth daily. 30 tablet 5   No current facility-administered medications on file prior to visit.     Allergies  Allergen Reactions  . Atorvastatin     REACTION: raises blood pressure  . Pravastatin Sodium     REACTION: raises blood pressure  . Statins     REACTION: raises bp     Past Medical History:  Diagnosis Date  . Actinic keratosis   . BPH (benign prostatic hypertrophy)   . Diseases of lips   . Diverticulosis of colon 06/2003  . ED (erectile dysfunction)   . GERD (gastroesophageal reflux disease)   . Heart murmur    as child  . HLD (hyperlipidemia)   . HTN (hypertension)   . Osteoarthritis of knee    severe, bilat  . Renal insufficiency   . Restless legs syndrome (RLS)   . Sleep disturbance, unspecified   . Spinal stenosis, lumbar region, without neurogenic claudication     Past Surgical History:  Procedure Laterality Date  . CATARACT EXTRACTION, BILATERAL  2011   Dr. Thomasene Ripple  . FINGER SURGERY     Right 4th finger  . [facility]  10/2009   Dr. Bary Castilla  . LUMBAR SPINE SURGERY  06/2005   stenosis repair  . TONSILLECTOMY  childhood  . TOTAL KNEE ARTHROPLASTY  06/2008   bilat (Dr. Marry Guan)    Family History  Problem Relation Age of Onset  . Other Father        "clogged arteries"  . Hypertension Mother     Social History   Social History  . Marital status: Widowed    Spouse name: N/A  . Number of children: 2  . Years of education: N/A   Occupational History  .  Retired    Social History Main Topics  . Smoking status: Former Smoker    Quit date: 02/28/1970  . Smokeless tobacco: Never Used  . Alcohol use 0.0 oz/week     Comment: 1-2 glasses wine/daily  . Drug use: No  . Sexual activity: Not on file   Other Topics Concern  . Not on file   Social History Narrative   Widowed 2010   2 adopted children   Has living will   Son is health care power of attorney   Would accept resuscitation but no prolonged ventilation   No tube feeds if cognitively unaware   Review of Systems No vertigo Feels slightly off balance No unilateral vision loss--but getting injections in left eye No focal weakness No aphasia or dysphagia No facial droop Started exercise class about a week ago-- 30 minutes of variable stuff Having more urinary  difficulty No abnormal bleeding--stopped the aspirin Appetite is good Weight stable    Objective:   Physical Exam  Constitutional: He appears well-nourished. No distress.  Cardiovascular: Normal rate, regular rhythm and normal heart sounds.  Exam reveals no gallop.   No murmur heard. Pulmonary/Chest: Effort normal and breath sounds normal. No respiratory distress. He has no wheezes. He has no rales.  Musculoskeletal: He exhibits no edema.  Neurological:  Usual slightly shuffling gait Romberg absent No focal weakness          Assessment & Plan:

## 2016-08-08 NOTE — Telephone Encounter (Signed)
Please check on him tomorrow morning

## 2016-08-08 NOTE — Assessment & Plan Note (Signed)
BP Readings from Last 3 Encounters:  08/08/16 (!) 180/110  06/22/16 140/88  05/31/16 136/90   Recheck 172/108 on right Will add amlodipine and check back soon

## 2016-08-08 NOTE — Assessment & Plan Note (Signed)
I do have concern about worsening of CKD with tennis related dehydration Will recheck labs

## 2016-08-08 NOTE — Telephone Encounter (Signed)
Patient saw Dr.Letvak this morning about his blood pressure.  Patient said he went home and drank water and his blood pressure was going down.  Patient said all of a sudden it went up to 213/119.  Patient went to the pharmacy and it had the same reading.  I spoke to [facility] and he asked if patient had taken the medication he prescribed for him this morning.  Patient said he just took the medication.  Dr.Letvak said if patient's blood pressure goes back up tonight to take another dose of medicine and call back in the morning because the medication might need to be doubled. I let patient know if it continues to increase he can call back and talk to the nurse.

## 2016-08-08 NOTE — Assessment & Plan Note (Signed)
Worse now off doxazosin but didn't tolerate Will consider flomax

## 2016-08-08 NOTE — Assessment & Plan Note (Signed)
Has had recurrent spells but this seems different Started after 2 hours of tennis and persists Not orthostatic BP still elevated here No new neuro findings

## 2016-08-09 NOTE — Telephone Encounter (Addendum)
Pt took the 2.5mg  #2 Amlodipine as directed earlier. He laid down and rested and BP went down to 145/85. Got up and went to [place] at 130 and came home at 430. BP was 183/108.  He takes losartan-hctz 100-25 in the mornings. He just started amlodipine 2.5mg  yesterday. Took 5mg  this morning.  Pt said he was going to take an extra amlodipine tonight.  Left Dr Silvio Pate a message.

## 2016-08-09 NOTE — Telephone Encounter (Signed)
Please ask him to take 2 of the 2.5mg  amlodipine daily (he can take them at the same time) I would not get too excited about the measurement at the dentist---people are always high at dentist

## 2016-08-09 NOTE — Telephone Encounter (Signed)
Spoke to pt. He said he was at the dentist this morning. He said he took a second dose of the new BP med along with his regular meds. He has not taken the new BP med this morning, but has taken his regular meds this morning. BP 180/100 at dentist this morning. 193/101 at home now.

## 2016-08-09 NOTE — Telephone Encounter (Signed)
Spoke to pt. He will take the 2 right now. I will call him after 430 today to check on his numbers.

## 2016-08-09 NOTE — Telephone Encounter (Signed)
Left message to call office

## 2016-08-10 NOTE — Telephone Encounter (Signed)
I think he should stick with 5mg  of amlodipine daily If he wants to split that as 2.5mg  bid--that would be fine He shouldn't overreact though---the extra 2.5mg  last night may be the reason for the low BP and lightheadedness this morming

## 2016-08-10 NOTE — Telephone Encounter (Signed)
Spoke to pt. He said he took the extra amlodipine after we spoke last night and his BP went down to 117/79. This morning it was 150/90 and he felt dizzy and lightheaded this morning.  He is about to take his morning meds of losartan-hctz. He was thinking of splitting up the amlodipine 2.5mg  about 1pm and another about 6pm. Wanted to know what Dr Silvio Pate thought about that.

## 2016-08-10 NOTE — Telephone Encounter (Signed)
Spoke to pt. He said he will plan to split up the amlodipine doses. Said his BP is 145/80 right now. Still a little lightheaded. He will let us know if that does not get any better in the next few days.

## 2016-08-14 ENCOUNTER — Encounter: Payer: Self-pay | Admitting: Internal Medicine

## 2016-08-15 MED ORDER — AMLODIPINE BESYLATE 2.5 MG PO TABS: 2.5000 mg | ORAL_TABLET | Freq: Every day | ORAL | 1 refills | Status: DC

## 2016-08-15 NOTE — Telephone Encounter (Signed)
Please confirm he is still taking 2 of the 2.5mg  tabs daily. If so, send 5mg , 1 tab daily #90 x 3

## 2016-08-15 NOTE — Telephone Encounter (Signed)
Refill sent as requested. 

## 2016-08-16 ENCOUNTER — Telehealth: Payer: Self-pay | Admitting: Internal Medicine

## 2016-08-16 NOTE — Telephone Encounter (Signed)
He is being changed to amlodipine 5mg  daily They should be able to fill this prescription--but he shouldn't need it for another week or so

## 2016-08-16 NOTE — Telephone Encounter (Signed)
Patient called and said he called [facility] about his medication.  Dr.Letvak sent a rx to [facility] for 2 pills a day for his new bp medication.  Patient said Pharmacy told him they can't fill the rx until 08/30/16.  Patient will only be able to take one pill a day for the other rx to last until 08/30/16. Please call patient.  If patient's not available, he said you can leave a detailed message on his answering machine.

## 2016-08-16 NOTE — Telephone Encounter (Signed)
Left message on vm per dpr.  Dr. Silvio Pate, do you want me to send in a new rx for 5mg  1 a day?

## 2016-08-17 MED ORDER — AMLODIPINE BESYLATE 5 MG PO TABS: 5.0000 mg | ORAL_TABLET | Freq: Every day | ORAL | 3 refills | Status: DC

## 2016-08-17 NOTE — Telephone Encounter (Signed)
Spoke to pt. Explained that the 5mg  is a new dosage and that the pharmacy should allow him to pick up the rx since the last one was for 2.5mg 

## 2016-08-17 NOTE — Telephone Encounter (Signed)
Pt left v/m requesting cb from [facility] to straighten out questions about Norvasc.

## 2016-08-17 NOTE — Telephone Encounter (Signed)
I see what happened. A 2.5 mg rx was sent Monday by mistake. I sent a new rx for the 5mg .

## 2016-08-17 NOTE — Telephone Encounter (Signed)
Yes--- I had already requested that (or might have even sent it myself) This is his dose now

## 2016-08-23 ENCOUNTER — Ambulatory Visit (INDEPENDENT_AMBULATORY_CARE_PROVIDER_SITE_OTHER): Payer: Medicare Other | Admitting: Internal Medicine

## 2016-08-23 ENCOUNTER — Encounter: Payer: Self-pay | Admitting: Internal Medicine

## 2016-08-23 VITALS — BP 140/90 | HR 67 | Wt 187.0 lb

## 2016-08-23 DIAGNOSIS — I10 Essential (primary) hypertension: Secondary | ICD-10-CM

## 2016-08-23 DIAGNOSIS — N138 Other obstructive and reflux uropathy: Secondary | ICD-10-CM | POA: Diagnosis not present

## 2016-08-23 DIAGNOSIS — N401 Enlarged prostate with lower urinary tract symptoms: Secondary | ICD-10-CM | POA: Diagnosis not present

## 2016-08-23 MED ORDER — TAMSULOSIN HCL 0.4 MG PO CAPS: 0.4000 mg | ORAL_CAPSULE | Freq: Every day | ORAL | 3 refills | Status: DC

## 2016-08-23 NOTE — Progress Notes (Signed)
Subjective:    Patient ID: Stephen Garrett, male    DOB: [birth year], 83 y.o.   MRN: [id number]  HPI Here for follow up of elevated BP  Tolerating the 5mg  of amlodipine He notes his BP is labile--- 160/100 then 140/89 then 135/85 (very close together) Lowest 116/69 No dizziness  No chest pain No SOB No edema  Feels the prostate is a problem Urine stream is slowing down On doxazosin in past  Current Outpatient Prescriptions on File Prior to Visit  Medication Sig Dispense Refill  . amLODipine (NORVASC) 5 MG tablet Take 1 tablet (5 mg total) by mouth daily. 90 tablet 3  . buPROPion (WELLBUTRIN XL) 150 MG 24 hr tablet TAKE ONE TABLET BY MOUTH EVERY DAY 90 tablet 2  . clonazePAM (KLONOPIN) 0.5 MG tablet TAKE 1 OR 2 TABLETS BY MOUTH AT BEDTIME 120 tablet 0  . finasteride (PROSCAR) 5 MG tablet TAKE ONE TABLET BY MOUTH EVERY DAY 90 tablet 3  . Glucosamine-Chondroitin (GLUCOSAMINE CHONDR COMPLEX PO) Take by mouth daily.    Marland Kitchen losartan-hydrochlorothiazide (HYZAAR) 100-25 MG tablet TAKE ONE TABLET BY MOUTH EVERY DAY 90 tablet 3  . Multiple Vitamin (MULTIVITAMIN) capsule Take 1 capsule by mouth daily.      Marland Kitchen omeprazole (PRILOSEC) 40 MG capsule TAKE ONE CAPSULE BY MOUTH EVERY DAY 90 capsule 3  . pramipexole (MIRAPEX) 1 MG tablet TAKE 1 OR 2 TABLETS AT BEDTIME AS NEEDED 180 tablet 3  . pyridoxine (B-6) 100 MG tablet Take 100 mg by mouth daily.    . [DISCONTINUED] citalopram (CELEXA) 10 MG tablet Take 1 tablet (10 mg total) by mouth daily. 30 tablet 5   No current facility-administered medications on file prior to visit.     Allergies  Allergen Reactions  . Atorvastatin     REACTION: raises blood pressure  . Pravastatin Sodium     REACTION: raises blood pressure  . Statins     REACTION: raises bp    Past Medical History:  Diagnosis Date  . Actinic keratosis   . BPH (benign prostatic hypertrophy)   . Diseases of lips   . Diverticulosis of colon 06/2003  . ED (erectile dysfunction)    . GERD (gastroesophageal reflux disease)   . Heart murmur    as child  . HLD (hyperlipidemia)   . HTN (hypertension)   . Osteoarthritis of knee    severe, bilat  . Renal insufficiency   . Restless legs syndrome (RLS)   . Sleep disturbance, unspecified   . Spinal stenosis, lumbar region, without neurogenic claudication     Past Surgical History:  Procedure Laterality Date  . CATARACT EXTRACTION, BILATERAL  2011   Dr. Thomasene Ripple  . FINGER SURGERY     Right 4th finger  . [facility]  10/2009   Dr. Bary Castilla  . LUMBAR SPINE SURGERY  06/2005   stenosis repair  . TONSILLECTOMY  childhood  . TOTAL KNEE ARTHROPLASTY  06/2008   bilat (Dr. Marry Guan)    Family History  Problem Relation Age of Onset  . Other Father        "clogged arteries"  . Hypertension Mother     Social History   Social History  . Marital status: Widowed    Spouse name: N/A  . Number of children: 2  . Years of education: N/A   Occupational History  . Retired    Social History Main Topics  . Smoking status: Former Smoker    Quit date: 02/28/1970  . Smokeless  tobacco: Never Used  . Alcohol use 0.0 oz/week     Comment: 1-2 glasses wine/daily  . Drug use: No  . Sexual activity: Not on file   Other Topics Concern  . Not on file   Social History Narrative   Widowed 2010   2 adopted children   Has living will   Son is health care power of attorney   Would accept resuscitation but no prolonged ventilation   No tube feeds if cognitively unaware   Review of Systems Occasional headache Has taken some time off tennis--plans to restart    Objective:   Physical Exam  Constitutional: He appears well-nourished. No distress.  Cardiovascular: Normal rate, regular rhythm and normal heart sounds.  Exam reveals no gallop.   No murmur heard. Pulmonary/Chest: Effort normal and breath sounds normal. No respiratory distress. He has no wheezes. He has no rales.  Musculoskeletal: He exhibits no edema.  Psychiatric: He has  a normal mood and affect. His behavior is normal.          Assessment & Plan:

## 2016-08-23 NOTE — Assessment & Plan Note (Signed)
Voiding has slowed down problematically He did rechallenge himself with the doxazosin--and felt terrible Will try flomax

## 2016-08-23 NOTE — Assessment & Plan Note (Signed)
BP Readings from Last 3 Encounters:  08/23/16 140/90  08/08/16 (!) 180/110  06/22/16 140/88   Much better on the amlodipine Will continue current regimen

## 2016-08-26 DIAGNOSIS — H34832 Tributary (branch) retinal vein occlusion, left eye, with macular edema: Secondary | ICD-10-CM | POA: Diagnosis not present

## 2016-08-29 ENCOUNTER — Other Ambulatory Visit: Payer: Self-pay | Admitting: Internal Medicine

## 2016-09-06 ENCOUNTER — Encounter: Payer: Self-pay | Admitting: Internal Medicine

## 2016-09-06 MED ORDER — CLONAZEPAM 0.5 MG PO TABS: ORAL_TABLET | ORAL | 0 refills | Status: DC

## 2016-09-06 NOTE — Telephone Encounter (Signed)
Verbal refill given to [facility] at the pharmacy. MyChart message sent to the pt

## 2016-09-06 NOTE — Telephone Encounter (Signed)
Okay to refill #180 x 0 This should be at least 3 month supply

## 2016-09-21 DIAGNOSIS — X32XXXA Exposure to sunlight, initial encounter: Secondary | ICD-10-CM | POA: Diagnosis not present

## 2016-09-21 DIAGNOSIS — Z85820 Personal history of malignant melanoma of skin: Secondary | ICD-10-CM | POA: Diagnosis not present

## 2016-09-21 DIAGNOSIS — C44319 Basal cell carcinoma of skin of other parts of face: Secondary | ICD-10-CM | POA: Diagnosis not present

## 2016-09-21 DIAGNOSIS — Z85828 Personal history of other malignant neoplasm of skin: Secondary | ICD-10-CM | POA: Diagnosis not present

## 2016-09-21 DIAGNOSIS — R600 Localized edema: Secondary | ICD-10-CM | POA: Diagnosis not present

## 2016-09-21 DIAGNOSIS — D485 Neoplasm of uncertain behavior of skin: Secondary | ICD-10-CM | POA: Diagnosis not present

## 2016-09-21 DIAGNOSIS — L570 Actinic keratosis: Secondary | ICD-10-CM | POA: Diagnosis not present

## 2016-09-21 DIAGNOSIS — Z08 Encounter for follow-up examination after completed treatment for malignant neoplasm: Secondary | ICD-10-CM | POA: Diagnosis not present

## 2016-10-07 DIAGNOSIS — H34832 Tributary (branch) retinal vein occlusion, left eye, with macular edema: Secondary | ICD-10-CM | POA: Diagnosis not present

## 2016-10-15 ENCOUNTER — Encounter: Payer: Self-pay | Admitting: Internal Medicine

## 2016-10-17 ENCOUNTER — Encounter: Payer: Self-pay | Admitting: Internal Medicine

## 2016-10-17 DIAGNOSIS — H9190 Unspecified hearing loss, unspecified ear: Secondary | ICD-10-CM

## 2016-10-19 ENCOUNTER — Encounter: Payer: Self-pay | Admitting: Internal Medicine

## 2016-11-06 ENCOUNTER — Encounter: Payer: Self-pay | Admitting: Internal Medicine

## 2016-11-07 ENCOUNTER — Encounter: Payer: Self-pay | Admitting: Internal Medicine

## 2016-11-07 DIAGNOSIS — H903 Sensorineural hearing loss, bilateral: Secondary | ICD-10-CM | POA: Diagnosis not present

## 2016-11-08 DIAGNOSIS — C44319 Basal cell carcinoma of skin of other parts of face: Secondary | ICD-10-CM | POA: Diagnosis not present

## 2016-11-09 ENCOUNTER — Encounter: Payer: Self-pay | Admitting: Internal Medicine

## 2016-11-09 MED ORDER — DOXAZOSIN MESYLATE 4 MG PO TABS: 4.0000 mg | ORAL_TABLET | Freq: Every day | ORAL | 0 refills | Status: DC

## 2016-11-11 DIAGNOSIS — H34832 Tributary (branch) retinal vein occlusion, left eye, with macular edema: Secondary | ICD-10-CM | POA: Diagnosis not present

## 2016-12-01 DIAGNOSIS — H903 Sensorineural hearing loss, bilateral: Secondary | ICD-10-CM | POA: Diagnosis not present

## 2016-12-02 ENCOUNTER — Telehealth: Payer: Self-pay

## 2016-12-02 NOTE — Telephone Encounter (Signed)
I do not see anything Can check with his office---Dr Vicie Mutters in [place]

## 2016-12-02 NOTE — Telephone Encounter (Signed)
Pt left v/m that Dr Charleston Ropes is supposed to email Dr Silvio Pate indicating testing that needs to be done prior to cochlear implant; pt request cb to have testing scheduled. Pt will be out of town 12/05/16 - 12/07/16. Pt is also available this afternoon.Please advise.

## 2016-12-05 NOTE — Telephone Encounter (Signed)
Please let him know he needs a preop evaluation with CXR and EKG as well as blood work Please schedule him for 30 minutes

## 2016-12-05 NOTE — Telephone Encounter (Signed)
Spoke to Santiago Glad at Dr Thornell Mule' office. She said it was faxed on 12-01-16. I asked that she refax it to the [phone] attn to me. She is supposed to be sending it now.

## 2016-12-05 NOTE — Telephone Encounter (Signed)
Received fax and placed in Dr Alla German Inbox on his desk

## 2016-12-06 ENCOUNTER — Other Ambulatory Visit: Payer: Self-pay | Admitting: Otolaryngology

## 2016-12-06 DIAGNOSIS — H903 Sensorineural hearing loss, bilateral: Secondary | ICD-10-CM

## 2016-12-07 NOTE — Telephone Encounter (Signed)
Patient scheduled appointment on 12/09/16 at 12:15.

## 2016-12-07 NOTE — Telephone Encounter (Signed)
Okay 

## 2016-12-09 ENCOUNTER — Encounter: Payer: Self-pay | Admitting: Internal Medicine

## 2016-12-09 ENCOUNTER — Other Ambulatory Visit: Payer: Self-pay

## 2016-12-09 ENCOUNTER — Ambulatory Visit (INDEPENDENT_AMBULATORY_CARE_PROVIDER_SITE_OTHER): Payer: Medicare Other | Admitting: Internal Medicine

## 2016-12-09 ENCOUNTER — Ambulatory Visit (INDEPENDENT_AMBULATORY_CARE_PROVIDER_SITE_OTHER)
Admission: RE | Admit: 2016-12-09 | Discharge: 2016-12-09 | Disposition: A | Discharge status: Home / Self Care | Payer: Medicare Other | Source: Ambulatory Visit | Attending: Internal Medicine | Admitting: Internal Medicine

## 2016-12-09 VITALS — BP 138/86 | HR 67 | Temp 97.4°F | Wt 186.0 lb

## 2016-12-09 DIAGNOSIS — H348122 Central retinal vein occlusion, left eye, stable: Secondary | ICD-10-CM

## 2016-12-09 DIAGNOSIS — Z23 Encounter for immunization: Secondary | ICD-10-CM | POA: Diagnosis not present

## 2016-12-09 DIAGNOSIS — J449 Chronic obstructive pulmonary disease, unspecified: Secondary | ICD-10-CM | POA: Diagnosis not present

## 2016-12-09 DIAGNOSIS — Z01818 Encounter for other preprocedural examination: Secondary | ICD-10-CM

## 2016-12-09 DIAGNOSIS — I10 Essential (primary) hypertension: Secondary | ICD-10-CM

## 2016-12-09 DIAGNOSIS — Z9189 Other specified personal risk factors, not elsewhere classified: Secondary | ICD-10-CM

## 2016-12-09 LAB — PROTIME-INR
INR: 1 ratio (ref 0.8–1.0)
Prothrombin Time: 10.4 s (ref 9.6–13.1)

## 2016-12-09 LAB — CBC WITH DIFFERENTIAL/PLATELET
Basophils Absolute: 0 10*3/uL (ref 0.0–0.1)
Basophils Relative: 0.4 % (ref 0.0–3.0)
Eosinophils Absolute: 0.1 10*3/uL (ref 0.0–0.7)
Eosinophils Relative: 1.5 % (ref 0.0–5.0)
HCT: 40.8 % (ref 39.0–52.0)
Hemoglobin: 13.7 g/dL (ref 13.0–17.0)
Lymphocytes Relative: 30.1 % (ref 12.0–46.0)
Lymphs Abs: 1.7 10*3/uL (ref 0.7–4.0)
MCHC: 33.5 g/dL (ref 30.0–36.0)
MCV: 94.4 fl (ref 78.0–100.0)
Monocytes Absolute: 0.3 10*3/uL (ref 0.1–1.0)
Monocytes Relative: 5.8 % (ref 3.0–12.0)
Neutro Abs: 3.5 10*3/uL (ref 1.4–7.7)
Neutrophils Relative %: 62.2 % (ref 43.0–77.0)
Platelets: 169 10*3/uL (ref 150.0–400.0)
RBC: 4.33 Mil/uL (ref 4.22–5.81)
RDW: 14.3 % (ref 11.5–15.5)
WBC: 5.7 10*3/uL (ref 4.0–10.5)

## 2016-12-09 LAB — APTT: aPTT: 30.7 s (ref 23.4–32.7)

## 2016-12-09 LAB — COMPREHENSIVE METABOLIC PANEL
ALT: 22 U/L (ref 0–53)
AST: 27 U/L (ref 0–37)
Albumin: 4.2 g/dL (ref 3.5–5.2)
Alkaline Phosphatase: 61 U/L (ref 39–117)
BUN: 28 mg/dL — ABNORMAL HIGH (ref 6–23)
CO2: 30 mEq/L (ref 19–32)
Calcium: 9.1 mg/dL (ref 8.4–10.5)
Chloride: 102 mEq/L (ref 96–112)
Creatinine, Ser: 1.39 mg/dL (ref 0.40–1.50)
GFR: 50.88 mL/min — ABNORMAL LOW (ref >60.00)
Glucose, Bld: 95 mg/dL (ref 70–99)
Potassium: 3.7 mEq/L (ref 3.5–5.1)
Sodium: 140 mEq/L (ref 135–145)
Total Bilirubin: 0.7 mg/dL (ref 0.2–1.2)
Total Protein: 6.6 g/dL (ref 6.0–8.3)

## 2016-12-09 NOTE — Assessment & Plan Note (Signed)
Planned for cochlear implant---serious social issues with being "cut off from the world" No medical contraindications for the procedure CXR looks okay--no acute findings (will await radiologist reading) EKG shows sinus at 63. Normal axis and intervals. Poor R wave progression till V5--likely a normal or lead placement variant. Compared to 2/17--- no sig change (less R in V4 is probably lead placement variant) No special perioperative treatment is needed

## 2016-12-09 NOTE — Progress Notes (Signed)
   Subjective:    Patient ID: Stephen Garrett, male    DOB: 11-18-1923, 83 y.o.   MRN: [id number]  HPI Did see Dr Thornell Mule and planning cochlear implant Needs preoperative evaluation  No chest pain Still plays tennis--- doubles only Recent tournament-- 4 hours total in 1 day  No SOB No dizziness or syncope No recent fever. Rare cough No edema in feet----slight swelling in calves if up a lot. Gone in AM  Concerned about his stamina Does some exercises--bending,etc Discussed that I think he can consider a session with an athletic trainer  Has friend who is taking "stem enhance ultra" He sells it   Review of Systems Retried the doxazosin and got headaches--- went back to the flomax Appetite is good Sleep is still interrupted by nocturia (and a snack)--able to get back to sleep    Objective:   Physical Exam  Constitutional: No distress.  Neck: No thyromegaly present.  Cardiovascular: Normal rate, regular rhythm and normal heart sounds.  Exam reveals no gallop.   No murmur heard. Pulmonary/Chest: Effort normal. No respiratory distress. He has no wheezes. He has no rales.  Slightly muffled in bases  Abdominal: Soft. He exhibits no distension. There is no tenderness. There is no rebound and no guarding.  Musculoskeletal: He exhibits no edema.  Lymphadenopathy:    He has no cervical adenopathy.  Skin: No rash noted.  Psychiatric: His behavior is normal.          Assessment & Plan:

## 2016-12-09 NOTE — Addendum Note (Signed)
Addended by: Pilar Grammes on: 12/09/2016 02:16 PM   Modules accepted: Orders

## 2016-12-12 ENCOUNTER — Ambulatory Visit
Admission: RE | Admit: 2016-12-12 | Discharge: 2016-12-12 | Disposition: A | Discharge status: Home / Self Care | Payer: Medicare Other | Source: Ambulatory Visit | Attending: Otolaryngology | Admitting: Otolaryngology

## 2016-12-12 DIAGNOSIS — H903 Sensorineural hearing loss, bilateral: Secondary | ICD-10-CM

## 2016-12-15 ENCOUNTER — Ambulatory Visit: Payer: BLUE CROSS/BLUE SHIELD

## 2016-12-20 DIAGNOSIS — H34832 Tributary (branch) retinal vein occlusion, left eye, with macular edema: Secondary | ICD-10-CM | POA: Diagnosis not present

## 2016-12-20 DIAGNOSIS — H43813 Vitreous degeneration, bilateral: Secondary | ICD-10-CM | POA: Diagnosis not present

## 2017-01-04 DIAGNOSIS — S0501XA Injury of conjunctiva and corneal abrasion without foreign body, right eye, initial encounter: Secondary | ICD-10-CM | POA: Diagnosis not present

## 2017-01-06 DIAGNOSIS — D225 Melanocytic nevi of trunk: Secondary | ICD-10-CM | POA: Diagnosis not present

## 2017-01-06 DIAGNOSIS — Z85828 Personal history of other malignant neoplasm of skin: Secondary | ICD-10-CM | POA: Diagnosis not present

## 2017-01-06 DIAGNOSIS — D2262 Melanocytic nevi of left upper limb, including shoulder: Secondary | ICD-10-CM | POA: Diagnosis not present

## 2017-01-06 DIAGNOSIS — Z85820 Personal history of malignant melanoma of skin: Secondary | ICD-10-CM | POA: Diagnosis not present

## 2017-01-10 DIAGNOSIS — H34832 Tributary (branch) retinal vein occlusion, left eye, with macular edema: Secondary | ICD-10-CM | POA: Diagnosis not present

## 2017-01-23 ENCOUNTER — Other Ambulatory Visit: Payer: Self-pay | Admitting: Internal Medicine

## 2017-01-23 NOTE — Telephone Encounter (Signed)
Approved:  #[id number] x 0 

## 2017-01-23 NOTE — Telephone Encounter (Signed)
Last filled 09-06-16 #180 Last OV 12-09-16 Next OV 06-06-17

## 2017-01-23 NOTE — Telephone Encounter (Signed)
Verbal refill given to [facility] at the pharmacy

## 2017-01-24 DIAGNOSIS — H34832 Tributary (branch) retinal vein occlusion, left eye, with macular edema: Secondary | ICD-10-CM | POA: Diagnosis not present

## 2017-02-02 DIAGNOSIS — H903 Sensorineural hearing loss, bilateral: Secondary | ICD-10-CM | POA: Diagnosis not present

## 2017-02-10 ENCOUNTER — Encounter (HOSPITAL_BASED_OUTPATIENT_CLINIC_OR_DEPARTMENT_OTHER)
Admission: RE | Admit: 2017-02-10 | Discharge: 2017-02-10 | Disposition: A | Discharge status: Home / Self Care | Payer: Medicare Other | Source: Ambulatory Visit | Attending: Otolaryngology | Admitting: Otolaryngology

## 2017-02-10 ENCOUNTER — Encounter (HOSPITAL_BASED_OUTPATIENT_CLINIC_OR_DEPARTMENT_OTHER): Payer: Self-pay | Admitting: *Deleted

## 2017-02-10 ENCOUNTER — Other Ambulatory Visit: Payer: Self-pay

## 2017-02-10 DIAGNOSIS — I739 Peripheral vascular disease, unspecified: Secondary | ICD-10-CM | POA: Insufficient documentation

## 2017-02-10 DIAGNOSIS — N289 Disorder of kidney and ureter, unspecified: Secondary | ICD-10-CM | POA: Insufficient documentation

## 2017-02-10 DIAGNOSIS — I10 Essential (primary) hypertension: Secondary | ICD-10-CM | POA: Diagnosis not present

## 2017-02-10 DIAGNOSIS — M1990 Unspecified osteoarthritis, unspecified site: Secondary | ICD-10-CM | POA: Diagnosis not present

## 2017-02-10 DIAGNOSIS — Z01812 Encounter for preprocedural laboratory examination: Secondary | ICD-10-CM | POA: Insufficient documentation

## 2017-02-10 LAB — BASIC METABOLIC PANEL
Anion gap: 7 (ref 5–15)
BUN: 29 mg/dL — ABNORMAL HIGH (ref 6–20)
CO2: 27 mmol/L (ref 22–32)
Calcium: 9.5 mg/dL (ref 8.9–10.3)
Chloride: 104 mmol/L (ref 101–111)
Creatinine, Ser: 1.59 mg/dL — ABNORMAL HIGH (ref 0.61–1.24)
GFR calc Af Amer: 42 mL/min — ABNORMAL LOW (ref >60)
GFR calc non Af Amer: 36 mL/min — ABNORMAL LOW (ref >60)
Glucose, Bld: 91 mg/dL (ref 65–99)
Potassium: 4 mmol/L (ref 3.5–5.1)
Sodium: 138 mmol/L (ref 135–145)

## 2017-02-10 NOTE — H&P (Signed)
Stephen Garrett is an 83 y.o. male.   Chief Complaint: Moderately severe to severe bilateral sensorineural hearing loss beyond hearing aids  HPI: See H&P Below  History & Physical Examination   Patient:  Stephen Garrett  Date of Birth: 1923-06-29  Provider: Vicie Mutters, MD, MS, FACS  Date of Service:  Feb 02, 2017  Location: [facility], [place], [place]                  [place], [place]   [id number]                                Ph: [phone], Fax: [phone]                  [email or web address]     Provider: Vicie Mutters, MD, MS, FACS Encounter Date: Feb 02, 2017 Patient: Stephen Garrett, Stephen Garrett    ([id number]) Sex: Male       DOB: 1923/12/15      Age: 60 year 5 month        Race: White Address: [address]  [place]  [address]    Pref. Phone(C): [phone] Primary Dr.: Delfino Lovett LETVAK Insurance(s):  MEDICARE (PP)  Referred By:  Vicie Mutters   Visit Type: Inetta Fermo, a 50 year 5 month White male is here today for a pre-operative visit.  Complaint/HPI: The patient was here today for a preoperative evaluation prior to undergoing a right cochlear implant with a MedEL Synchrony Mi1200 PIN + Flex24 electrode. The patient has been feeling well. He does not report any cough, fever, or upper/lower respiratory tract infection during the last week. His CT scan of his temporal bones was favorable for bilateral cochlear implants. He has had preoperative anesthesia clearance. Audiometric testing indicates that he is a good candidate for a hybrid cochlear implant, right ear. He continues to play doubles tennis several days a week.  Previous history: The patient was here today with his daughter for an evaluation of progressive sensorineural hearing loss in both ears. The patient reports that his hearing has declined over many years. He is now having difficulty hearing anyone that is not directly in front of him and he is  able to read their lips. He also is having significant difficulty in background noise. He uses only a Barista. He denied childhood ear infections or previous ear operations. He denied otorrhea, otalgia, tinnitus or vertigo. His family history is negative for hearing loss or ear disease. He lives by himself is mentally alert and continues to drive a car. 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 He has been wearing binaural digital BTE hearing aids that are not providing him with much benefit and he is interested in exploring a cochlear implant.   Current Medication: Other MD:  1. Pramipexole 1 Mg Tablet (LETVAK, RICHARD)  2. Finasteride 5 Mg Tablet (LETVAK, RICHARD)  3. Omeprazole Dr 40 Mg Capsule (LETVAK, RICHARD)  4. Bupropion Hcl Xl 150 Mg Tablet (LETVAK, RICHARD)  5. Amlodipine Besylate 2.5 Mg Tab (LETVAK, RICHARD)  6. Tamsulosin Hcl 0.4 Mg Capsule (LETVAK, RICHARD)  7. Losartan-hydrochlorothiazide 100-25 Mg Tab (LETVAK, RICHARD)  8. Clonazepam 0.5 Mg Tablet (LETVAK, RICHARD)  9. Doxazosin Mesylate 4 Mg Tab (LETVAK, RICHARD)   Medical History:  Vaccinations: Flu vaccinations: Yes, flu shot - since Nov 29, 2015- code [phone].  Pneumonia vaccination: Yes - code 4040F.  Surgical History: Prior surgeries include bilateral knee replacement.  Cancer: (+) Cancer: Skin Cancer..  Family History: The patient has a family history of high blood pressure.  Social History: No Traveled outside [place] in past 21 days? Adult. Smoking: His current smoking status is never smoker/non-smoker. Alcohol: Patient drinks alcoholic beverages. Marital Status: Patient is widowed. HIV status: (-) HIV status. Military History: Yes. Noise exposure: Patient denies noise exposure. Occupation: Currently retired. Recreational Drug Use: He denies recreational drug use.  Allergy: Statins-Hmg-Coa Reductase Inhibitors  ROS: General: (-) fever, (-) chills, (-) night sweats, (-) fatigue, (-)  weakness, (-) changes in appetite or weight. (-) allergies, (-) not immunocompromised. Head: (-) headaches, (-) head injury or deformity. Eyes: (+) cataracts. Ears: (+) hearing loss. Nose and Sinuses: (-) frequent colds, (-) nasal stuffiness or itchiness, (-) postnasal drip, (-) hay fever, (-) nosebleeds, (-) sinus trouble. Mouth and Throat: (-) bleeding gums, (-) toothache, (-) odd taste sensations, (-) sores on tongue, (-) frequent sore throat, (-) hoarseness. Neck: (-) swollen glands, (-) enlarged thyroid, (-) neck pain. Cardiac: (+) high blood pressure. Respiratory: (-) cough, (-) hemoptysis, (-) shortness of breath, (-) cyanosis, (-) wheezing, (-) nocturnal choking or gasping, (-) TB exposure. Gastrointestinal: (+) reflux. Urinary: (-) dysuria, (-) frequency, (-) urgency, (-) hesitancy, (-) polyuria, (-) nocturia, (-) hematuria, (-) urinary incontinence, (-) flank pain, (-) change in urinary habits. Gynecologic/Urologic: (-) genital sores or lesions, (-) history of STD, (-) sexual difficulties. Musculoskeletal: (+) arthritis. Peripheral Vascular: (-) intermittent claudication, (-) cramps, (-) varicose veins, (-) thrombophlebitis. Neurological: (-) numbness, (-) tingling, (-) tremors, (-) seizures, (-) vertigo, (-) dizziness, (-) memory loss, (-) any focal or diffuse neurological deficits. Psychiatric: (-) anxiety, (-) depression, (-) sleep disturbance, (-) irritability, (-) mood swings, (-) suicidal thoughts or ideations. Endocrine: (-) heat or cold intolerance, (-) excessive sweating, (-) diabetes, (-) excessive thirst, (-) excessive hunger, (-) excessive urination, (-) hirsutism, (-) change in ring or shoe size. Hematologic/Lymphatic: (-) anemia, (-) easy bruising, (-) excessive bleeding, (-) history of blood transfusions. Skin: (-) rashes, (-) lumps, (-) itching, (-) dryness, (-) acne, (-) discoloration, (-) recurrent skin infections, (-) changes in hair, nails or moles.  Vital  Signs: Weight:   84.822 kgs Height:   5' 7"  BMI:   29.29 BSA:   2.00 BP:   157/84(Right Arm)(Sitting), Active Diagnosis  Examination: Prior to the examination, I have reviewed: (1) the patient's current medications and allergies, (2) medical, family, and social histories, (3) review of systems, and (4) vital signs.  General Appearance - Adult: The patient is a well-developed, well-nourished, male, has no recognizable syndromes or patterns of malformation, and is in no acute distress. He is awake, alert, coherent, spontaneous, and logical. He is oriented to time, place, and person and communicates without difficulty.  Head: The patient's head was normocephalic and without any evidence of trauma or lesions.  Face: His facial motion was intact and symmetric bilaterally with normal resting facial tone and voluntary facial power.  Skin: Gross inspection of his facial skin demonstrated no evidence of abnormality.  Eyes: His pupils are equal, regular, reactive to light and accommodate (PERRLA). Extraocular movements were intact (EOMI). Conjunctivae were normal. There was no sclera icterus. There was no nystagmus. Eyelids appeared normal. There was no ptosis, lid lag, lid edema, or lagophthalmos.  External ears: Both of his external ears were normal in  size, shape, angulation, and location.  External auditory canals: Examination of the external auditory canals revealed a cerumen impaction of both EAC's.  Procedure: Cerumen Removal - Using the microscope and microinstruments, cerumen impactions were removed atraumatically from both EAC's. Both canals were stable and without signs of infection. The patient tolerated the procedure well.  Right Tympanic Membrane: The right tympanic membrane was clear and mobile. There was an air containing right middle ear space.  Left Tympanic Membrane: The left tympanic membrane was clear and mobile. There was an air containing left middle ear  space.  Respiratory: His chest was clear to auscultation.  Audiology Procedures: Audiogram/Graph Audiogram: I have referred the patient for audiometric testing. I have reviewed the patient's audiogram. (EMK).  Procedure:  Patient was placed in a special soundproof testing booth with earphones. Sounds were passed through the earphones. Each ear was tested separately. The patient was asked which part of the ear sound was heard. Earphones were removed, bone conduction hearing was then tested by having a vibrating instrument held close against the patient's ear. The patient was found to have bilateral moderately severe to severe downsloping sensorineural hearing losses with SRTs's of 60 DB AU, 28% discrimination AD and 36% discrimination AS both at 85 DB HTL.  Impression: Other:  1. Progressive bilateral moderately severe to severe high-frequency sensorineural hearing loss was some residual low frequency hearing. Patient has not done well with hearing aids. He is now beyond hearing aids. 2. The patient would benefit from a MedEL Synchrony Mi1200 PIN + Flex24 cochlear implant as he may require an MRI scan in the future. He understands that a hybrid implant has been recommended to him. 3. I have spent a significant amount of time with the daughter and the patient educating them concerning cochlear implants, how they work and how they are implanted and statistics about performance. They understand that one third of patients are low performers, one third are middle performers, and one third are high performers. The understand that proper expectations include at least 12 to 24 months of therapy and use of a cochlear implant, and that no guarantees can be made as to how he would hear postoperatively. He also understands that if he undergoes a hybrid cochlear implant. There is a chance that he could lose all of his residual hearing. Risks and complications of cochlear implantation were explained to him  including, but not limited to, infection, bleeding, reaction to anesthesia, total loss of hearing, tinnitus, facial weakness or paralysis, vertigo, need for additional operations, hypertrophic scarring, etc. He also understands that the procedure would be done at [facility] under general anesthesia, and will require an overnight stay. 4. Preop audiometric testing today. 5. CT scan of his temporal bones has been completed as well as preop anesthesia surgical clearance by Dr. Silvio Pate, including laboratory diagnostics, chest x-ray, and EKG, as well as a Pneumovax vaccine. 6. The patient has been provided with Cochlear implant literature. 7. Although the patient is 83 years of age, he is mentally awake and alert and may live for a long time. He placed and a several days a week. He understands that there are no guarantees that I can improve his hearing but I think that cochlear implantation may be able to help him significantly. He is living independently and is viable. He is a Publishing rights manager. 8. The patient is left-handed but writes right-handed in his right ear does not hear as well as his left ear. He also uses the  telephone on his left ear when he is able to use the telephone. Therefore, I have recommended implanting his right cochlea. 9. The patient is scheduled for a right cochlear implant using a MedEL hybrid device. Risks, complications, and alternatives were explained to him. He has read our cochlear implant informed consent form. The form has been signed and witnessed. He was given a copy of the written informed consent. Questions have been invited and answered. Preoperative teaching and counseling have been provided.  Plan: Clinical summary letter made available to patient today. This letter may not be complete at time of service. Please contact our office within 3 days for a completed summary of today's visit.  Status: stable. Medications: None required.  Diet: no  restriction. Procedure: Cochlear implant: Right ear. Duration:  4 hours. Surgeon: Fannie Knee MD Office Phone: [phone] Office Fax: [phone] Cell Phone: [phone]. Anesthesia Required: General. Equipment:  Mastoid instruments NIM monitor Stryker drill CI instruments. Implants:  MedEL hybrid cochlear implant. [facility]: yes. Latex Allergy: no.  Informed consent: Informed consent for a cochlear implant was provided. The consent was provided in an office examination room. The patient daughter was given our written Cochlear Implant Informed Consent to read because they could not hear. The recommended procedure(s) was/were explained to the patient daughter. Advantages, disadvantages, and options were reviewed, including doing nothing. Questions were invited and answered. Preoperative teaching and counseling were provided. The time and date of the procedure were reviewed. No guarantees were implied or made that cochlear implantation would be able to provide any hearing or useful hearing. Informed consent - status: Informed consent was provided and was signed and witnessed.  Recommendations: The patient should wear a hearing aid in both ears. Follow-Up: Postoperative visit as scheduled.  Diagnosis: H90.3  Sensorineural hearing loss, bilateral   Careplan: (1)  a BMI - HNS14 Body Mass Index (BMI)    Follow-up plan given.  We have noticed an issue with your BMI (Body Mass Index). We recommend that you contact your family physician. (2) Hearing Loss (3) Postop Tympanomastoid/cochlear implant  Followup: Postop visit   This visit note has been electronically signed off by Vicie Mutters, MD, MS, FACS on 02/02/2017 at 06:58 PM.       Next Appointment: 02/03/2017 at 10:15 AM     Past Medical History:  Diagnosis Date  . Actinic keratosis   . BPH (benign prostatic hypertrophy)   . Diseases of lips   . Diverticulosis of colon 06/2003  . ED (erectile  dysfunction)   . GERD (gastroesophageal reflux disease)   . Heart murmur    as child  . HLD (hyperlipidemia)   . HOH (hard of hearing)   . HTN (hypertension)   . Osteoarthritis of knee    severe, bilat  . Renal insufficiency   . Restless legs syndrome (RLS)   . Sleep disturbance, unspecified   . Spinal stenosis, lumbar region, without neurogenic claudication     Past Surgical History:  Procedure Laterality Date  . CATARACT EXTRACTION, BILATERAL  2011   Dr. Thomasene Ripple  . FINGER SURGERY     Right 4th finger  . [facility]  10/2009   Dr. Bary Castilla  . LUMBAR SPINE SURGERY  06/2005   stenosis repair  . TONSILLECTOMY  childhood  . TOTAL KNEE ARTHROPLASTY  06/2008   bilat (Dr. Marry Guan)    Family History  Problem Relation Age of Onset  . Other Father        "clogged  arteries"  . Hypertension Mother    Social History:  reports that he quit smoking about 46 years ago. he has never used smokeless tobacco. He reports that he drinks alcohol. He reports that he does not use drugs.  Allergies:  Allergies  Allergen Reactions  . Atorvastatin     REACTION: raises blood pressure  . Pravastatin Sodium     REACTION: raises blood pressure  . Statins     REACTION: raises bp    No medications prior to admission.    No results found for this or any previous visit (from the past 48 hour(s)). No results found.  Review of Systems  Constitutional: Negative.   HENT: Positive for hearing loss.   Eyes: Negative.   Respiratory: Negative.   Cardiovascular: Negative.   Gastrointestinal: Negative.   Genitourinary: Negative.   Musculoskeletal: Positive for joint pain.  Skin: Negative.   Neurological: Negative.   Endo/Heme/Allergies: Negative.     Height 5' 6.5" (1.689 m), weight 82.6 kg (182 lb). Physical Exam   Assessment/Plan 1. Moderately severe to severe bilateral sensorineural hearing loss beyond hearing aids 2. The patient has been extensively evaluated for a cochlear implant and has met  all CI criteria. He has been recommended for a right MedEL Synchrony Mi1200 PIN + Flex 24 electrode hybrid cochlear implant. Risks, complications, and alternatives to the operation have been discussed with him. Questions have been invited and answered. He has read our cochlear implant informed consent and the informed consent form has been signed and witnessed. A copy of the consent form has been given to him. Preoperative teaching and counseling have been provided. 3. Super senior 4. The operation has been scheduled for Wednesday, February 15, 2017, at [facility], at 8:30 am.   Vicie Mutters, MD 02/10/2017, 3:32 PM

## 2017-02-15 ENCOUNTER — Ambulatory Visit (HOSPITAL_COMMUNITY): Payer: Medicare Other

## 2017-02-15 ENCOUNTER — Ambulatory Visit (HOSPITAL_BASED_OUTPATIENT_CLINIC_OR_DEPARTMENT_OTHER): Payer: Medicare Other | Admitting: Anesthesiology

## 2017-02-15 ENCOUNTER — Other Ambulatory Visit: Payer: Self-pay

## 2017-02-15 ENCOUNTER — Encounter (HOSPITAL_BASED_OUTPATIENT_CLINIC_OR_DEPARTMENT_OTHER)
Admission: RE | Disposition: A | Discharge status: Home / Self Care | Payer: Self-pay | Source: Ambulatory Visit | Attending: Otolaryngology

## 2017-02-15 ENCOUNTER — Ambulatory Visit (HOSPITAL_BASED_OUTPATIENT_CLINIC_OR_DEPARTMENT_OTHER)
Admission: RE | Admit: 2017-02-15 | Discharge: 2017-02-16 | Disposition: A | Discharge status: Home / Self Care | Payer: Medicare Other | Source: Ambulatory Visit | Attending: Otolaryngology | Admitting: Otolaryngology

## 2017-02-15 ENCOUNTER — Encounter (HOSPITAL_BASED_OUTPATIENT_CLINIC_OR_DEPARTMENT_OTHER): Payer: Self-pay | Admitting: Anesthesiology

## 2017-02-15 DIAGNOSIS — I739 Peripheral vascular disease, unspecified: Secondary | ICD-10-CM | POA: Diagnosis not present

## 2017-02-15 DIAGNOSIS — Z4889 Encounter for other specified surgical aftercare: Secondary | ICD-10-CM

## 2017-02-15 DIAGNOSIS — Z79899 Other long term (current) drug therapy: Secondary | ICD-10-CM | POA: Diagnosis not present

## 2017-02-15 DIAGNOSIS — N400 Enlarged prostate without lower urinary tract symptoms: Secondary | ICD-10-CM | POA: Insufficient documentation

## 2017-02-15 DIAGNOSIS — Z9621 Cochlear implant status: Secondary | ICD-10-CM | POA: Diagnosis not present

## 2017-02-15 DIAGNOSIS — I10 Essential (primary) hypertension: Secondary | ICD-10-CM | POA: Diagnosis not present

## 2017-02-15 DIAGNOSIS — N183 Chronic kidney disease, stage 3 (moderate): Secondary | ICD-10-CM | POA: Diagnosis not present

## 2017-02-15 DIAGNOSIS — I129 Hypertensive chronic kidney disease with stage 1 through stage 4 chronic kidney disease, or unspecified chronic kidney disease: Secondary | ICD-10-CM | POA: Diagnosis not present

## 2017-02-15 DIAGNOSIS — H903 Sensorineural hearing loss, bilateral: Secondary | ICD-10-CM | POA: Diagnosis not present

## 2017-02-15 DIAGNOSIS — Z888 Allergy status to other drugs, medicaments and biological substances status: Secondary | ICD-10-CM | POA: Diagnosis not present

## 2017-02-15 DIAGNOSIS — Z419 Encounter for procedure for purposes other than remedying health state, unspecified: Secondary | ICD-10-CM

## 2017-02-15 DIAGNOSIS — G2581 Restless legs syndrome: Secondary | ICD-10-CM | POA: Diagnosis not present

## 2017-02-15 DIAGNOSIS — Z87891 Personal history of nicotine dependence: Secondary | ICD-10-CM | POA: Diagnosis not present

## 2017-02-15 DIAGNOSIS — H905 Unspecified sensorineural hearing loss: Secondary | ICD-10-CM | POA: Diagnosis not present

## 2017-02-15 DIAGNOSIS — K219 Gastro-esophageal reflux disease without esophagitis: Secondary | ICD-10-CM | POA: Diagnosis not present

## 2017-02-15 DIAGNOSIS — E785 Hyperlipidemia, unspecified: Secondary | ICD-10-CM | POA: Insufficient documentation

## 2017-02-15 HISTORY — PX: COCHLEAR IMPLANT: SHX184

## 2017-02-15 HISTORY — DX: Unspecified hearing loss, unspecified ear: H91.90

## 2017-02-15 SURGERY — INSERTION, IMPLANT, COCHLEAR
Anesthesia: General | Site: Ear | Laterality: Right

## 2017-02-15 MED ORDER — LOSARTAN POTASSIUM-HCTZ 100-25 MG PO TABS
1.0000 | ORAL_TABLET | Freq: Every day | ORAL | Status: DC
  Administered 2017-02-15: 1 via ORAL

## 2017-02-15 MED ORDER — ONDANSETRON HCL 4 MG/2ML IJ SOLN
INTRAMUSCULAR | Status: AC
  Filled 2017-02-15: qty 2

## 2017-02-15 MED ORDER — PANTOPRAZOLE SODIUM 40 MG PO TBEC: 40.0000 mg | DELAYED_RELEASE_TABLET | Freq: Every day | ORAL | Status: DC

## 2017-02-15 MED ORDER — SUCCINYLCHOLINE CHLORIDE 200 MG/10ML IV SOSY
PREFILLED_SYRINGE | INTRAVENOUS | Status: DC | PRN
  Administered 2017-02-15: 100 mg via INTRAVENOUS

## 2017-02-15 MED ORDER — BACITRACIN ZINC 500 UNIT/GM EX OINT
TOPICAL_OINTMENT | CUTANEOUS | Status: AC
  Filled 2017-02-15: qty 28.35

## 2017-02-15 MED ORDER — LACTATED RINGERS IV SOLN
INTRAVENOUS | Status: DC
  Administered 2017-02-15: 09:00:00 via INTRAVENOUS

## 2017-02-15 MED ORDER — DEXAMETHASONE SODIUM PHOSPHATE 10 MG/ML IJ SOLN
INTRAMUSCULAR | Status: AC
  Filled 2017-02-15: qty 1

## 2017-02-15 MED ORDER — CEFAZOLIN SODIUM-DEXTROSE 1-4 GM/50ML-% IV SOLN
1.0000 g | Freq: Three times a day (TID) | INTRAVENOUS | Status: AC
  Administered 2017-02-15 – 2017-02-16 (×3): 1 g via INTRAVENOUS
  Filled 2017-02-15 (×3): qty 50

## 2017-02-15 MED ORDER — IBUPROFEN 100 MG/5ML PO SUSP: 400.0000 mg | Freq: Four times a day (QID) | ORAL | Status: DC | PRN

## 2017-02-15 MED ORDER — CEFAZOLIN (ANCEF) 1 G IV SOLR
2.0000 g | INTRAVENOUS | Status: AC
  Administered 2017-02-15: 2 g

## 2017-02-15 MED ORDER — METHYLENE BLUE 0.5 % INJ SOLN
INTRAVENOUS | Status: AC
  Filled 2017-02-15: qty 10

## 2017-02-15 MED ORDER — MORPHINE SULFATE (PF) 2 MG/ML IV SOLN: 1.0000 mg | INTRAVENOUS | Status: DC | PRN

## 2017-02-15 MED ORDER — CEFAZOLIN SODIUM-DEXTROSE 2-4 GM/100ML-% IV SOLN
INTRAVENOUS | Status: AC
  Filled 2017-02-15: qty 100

## 2017-02-15 MED ORDER — DIAZEPAM 2 MG PO TABS: 2.0000 mg | ORAL_TABLET | Freq: Three times a day (TID) | ORAL | Status: DC | PRN

## 2017-02-15 MED ORDER — PROPOFOL 500 MG/50ML IV EMUL
INTRAVENOUS | Status: DC | PRN
  Administered 2017-02-15: 25 ug/kg/min via INTRAVENOUS

## 2017-02-15 MED ORDER — BACITRACIN ZINC 500 UNIT/GM EX OINT
TOPICAL_OINTMENT | CUTANEOUS | Status: AC
  Filled 2017-02-15: qty 0.9

## 2017-02-15 MED ORDER — PHENYLEPHRINE HCL 10 MG/ML IJ SOLN
INTRAMUSCULAR | Status: DC | PRN
  Administered 2017-02-15: 50 ug/min via INTRAVENOUS

## 2017-02-15 MED ORDER — SUCCINYLCHOLINE CHLORIDE 200 MG/10ML IV SOSY
PREFILLED_SYRINGE | INTRAVENOUS | Status: AC
  Filled 2017-02-15: qty 10

## 2017-02-15 MED ORDER — KETOROLAC TROMETHAMINE 30 MG/ML IJ SOLN
INTRAMUSCULAR | Status: DC | PRN
  Administered 2017-02-15: 30 mg via INTRAVENOUS

## 2017-02-15 MED ORDER — SCOPOLAMINE 1 MG/3DAYS TD PT72: 1.0000 | MEDICATED_PATCH | Freq: Once | TRANSDERMAL | Status: DC | PRN

## 2017-02-15 MED ORDER — DEXAMETHASONE SODIUM PHOSPHATE 10 MG/ML IJ SOLN
8.0000 mg | Freq: Three times a day (TID) | INTRAMUSCULAR | Status: AC
  Administered 2017-02-15 – 2017-02-16 (×3): 8 mg via INTRAVENOUS
  Filled 2017-02-15 (×3): qty 1

## 2017-02-15 MED ORDER — BUPROPION HCL ER (XL) 150 MG PO TB24: 150.0000 mg | ORAL_TABLET | Freq: Every day | ORAL | Status: DC

## 2017-02-15 MED ORDER — FENTANYL CITRATE (PF) 100 MCG/2ML IJ SOLN: 25.0000 ug | INTRAMUSCULAR | Status: DC | PRN

## 2017-02-15 MED ORDER — LIDOCAINE 2% (20 MG/ML) 5 ML SYRINGE
INTRAMUSCULAR | Status: DC | PRN
  Administered 2017-02-15: 80 mg via INTRAVENOUS

## 2017-02-15 MED ORDER — PROPOFOL 10 MG/ML IV BOLUS
INTRAVENOUS | Status: AC
  Filled 2017-02-15: qty 20

## 2017-02-15 MED ORDER — GLYCOPYRROLATE 0.2 MG/ML IV SOSY
PREFILLED_SYRINGE | INTRAVENOUS | Status: DC | PRN
  Administered 2017-02-15: .2 mg via INTRAVENOUS

## 2017-02-15 MED ORDER — TAMSULOSIN HCL 0.4 MG PO CAPS
0.4000 mg | ORAL_CAPSULE | Freq: Every day | ORAL | Status: DC
  Administered 2017-02-15: 0.4 mg via ORAL

## 2017-02-15 MED ORDER — DEXAMETHASONE SODIUM PHOSPHATE 4 MG/ML IJ SOLN
10.0000 mg | INTRAMUSCULAR | Status: AC
  Administered 2017-02-15: 10 mg via INTRAVENOUS

## 2017-02-15 MED ORDER — PHENYLEPHRINE HCL 10 MG/ML IJ SOLN
INTRAMUSCULAR | Status: AC
  Filled 2017-02-15: qty 1

## 2017-02-15 MED ORDER — VITAMIN B-6 100 MG PO TABS: 100.0000 mg | ORAL_TABLET | Freq: Every day | ORAL | Status: DC

## 2017-02-15 MED ORDER — BACITRACIN ZINC 500 UNIT/GM EX OINT
1.0000 "application " | TOPICAL_OINTMENT | Freq: Three times a day (TID) | CUTANEOUS | Status: DC
  Administered 2017-02-15 – 2017-02-16 (×2): 1 via TOPICAL

## 2017-02-15 MED ORDER — AMLODIPINE BESYLATE 5 MG PO TABS: 5.0000 mg | ORAL_TABLET | Freq: Every day | ORAL | Status: DC

## 2017-02-15 MED ORDER — DEXAMETHASONE SODIUM PHOSPHATE 10 MG/ML IJ SOLN
INTRAMUSCULAR | Status: DC | PRN
  Administered 2017-02-15: 20 mg via INTRAVENOUS

## 2017-02-15 MED ORDER — PHENOL 1.4 % MT LIQD
1.0000 | OROMUCOSAL | Status: DC | PRN
  Filled 2017-02-15: qty 177

## 2017-02-15 MED ORDER — FENTANYL CITRATE (PF) 100 MCG/2ML IJ SOLN
INTRAMUSCULAR | Status: AC
  Filled 2017-02-15: qty 2

## 2017-02-15 MED ORDER — FINASTERIDE 5 MG PO TABS: 5.0000 mg | ORAL_TABLET | Freq: Every day | ORAL | Status: DC

## 2017-02-15 MED ORDER — PROPOFOL 500 MG/50ML IV EMUL
INTRAVENOUS | Status: AC
Start: 2017-02-15 — End: ?
  Filled 2017-02-15: qty 50

## 2017-02-15 MED ORDER — ZOLPIDEM TARTRATE 5 MG PO TABS: 5.0000 mg | ORAL_TABLET | Freq: Every evening | ORAL | Status: DC | PRN

## 2017-02-15 MED ORDER — HYDROCODONE-ACETAMINOPHEN 5-325 MG PO TABS: 1.0000 | ORAL_TABLET | ORAL | Status: DC | PRN

## 2017-02-15 MED ORDER — SENNOSIDES-DOCUSATE SODIUM 8.6-50 MG PO TABS: 1.0000 | ORAL_TABLET | Freq: Every evening | ORAL | Status: DC | PRN

## 2017-02-15 MED ORDER — DEXTROSE IN LACTATED RINGERS 5 % IV SOLN
INTRAVENOUS | Status: DC
  Administered 2017-02-15 – 2017-02-16 (×3): via INTRAVENOUS

## 2017-02-15 MED ORDER — PRAMIPEXOLE DIHYDROCHLORIDE 1 MG PO TABS
1.0000 mg | ORAL_TABLET | Freq: Once | ORAL | Status: AC
  Administered 2017-02-15: 1 mg via ORAL

## 2017-02-15 MED ORDER — CIPROFLOXACIN-HYDROCORTISONE 0.2-1 % OT SUSP
OTIC | Status: DC | PRN
  Administered 2017-02-15: 3 [drp] via OTIC

## 2017-02-15 MED ORDER — LIDOCAINE-EPINEPHRINE 1 %-1:100000 IJ SOLN
INTRAMUSCULAR | Status: DC | PRN
  Administered 2017-02-15: 7 mL

## 2017-02-15 MED ORDER — SODIUM CHLORIDE 0.9 % IR SOLN
Status: DC | PRN
  Administered 2017-02-15: 10:00:00

## 2017-02-15 MED ORDER — LIDOCAINE 2% (20 MG/ML) 5 ML SYRINGE
INTRAMUSCULAR | Status: AC
  Filled 2017-02-15: qty 5

## 2017-02-15 MED ORDER — PROPOFOL 10 MG/ML IV BOLUS
INTRAVENOUS | Status: DC | PRN
  Administered 2017-02-15: 30 mg via INTRAVENOUS
  Administered 2017-02-15: 120 mg via INTRAVENOUS

## 2017-02-15 MED ORDER — DEXAMETHASONE SODIUM PHOSPHATE 10 MG/ML IJ SOLN
INTRAMUSCULAR | Status: AC
  Filled 2017-02-15: qty 2

## 2017-02-15 MED ORDER — MIDAZOLAM HCL 2 MG/2ML IJ SOLN: 1.0000 mg | INTRAMUSCULAR | Status: DC | PRN

## 2017-02-15 MED ORDER — ONDANSETRON HCL 4 MG/2ML IJ SOLN
INTRAMUSCULAR | Status: AC
Start: 2017-02-15 — End: ?
  Filled 2017-02-15: qty 2

## 2017-02-15 MED ORDER — CLONAZEPAM 0.5 MG PO TABS
0.5000 mg | ORAL_TABLET | Freq: Every day | ORAL | Status: AC
  Administered 2017-02-15: 0.5 mg via ORAL

## 2017-02-15 MED ORDER — EPINEPHRINE 30 MG/30ML IJ SOLN
INTRAMUSCULAR | Status: AC
  Filled 2017-02-15: qty 1

## 2017-02-15 MED ORDER — ONDANSETRON HCL 4 MG/2ML IJ SOLN
4.0000 mg | INTRAMUSCULAR | Status: AC
  Administered 2017-02-15 (×2): 4 mg via INTRAVENOUS

## 2017-02-15 MED ORDER — KETOROLAC TROMETHAMINE 30 MG/ML IJ SOLN
INTRAMUSCULAR | Status: AC
  Filled 2017-02-15: qty 1

## 2017-02-15 MED ORDER — LIDOCAINE-EPINEPHRINE 1 %-1:100000 IJ SOLN
INTRAMUSCULAR | Status: AC
  Filled 2017-02-15: qty 1

## 2017-02-15 MED ORDER — DEXAMETHASONE SODIUM PHOSPHATE 10 MG/ML IJ SOLN
INTRAMUSCULAR | Status: AC
Start: 2017-02-15 — End: ?
  Filled 2017-02-15: qty 1

## 2017-02-15 MED ORDER — CIPROFLOXACIN-HYDROCORTISONE 0.2-1 % OT SUSP
OTIC | Status: AC
  Filled 2017-02-15: qty 10

## 2017-02-15 MED ORDER — METHYLENE BLUE 0.5 % INJ SOLN
INTRAVENOUS | Status: DC | PRN
  Administered 2017-02-15: 1 mL via SUBMUCOSAL

## 2017-02-15 MED ORDER — FENTANYL CITRATE (PF) 100 MCG/2ML IJ SOLN: 50.0000 ug | INTRAMUSCULAR | Status: DC | PRN

## 2017-02-15 MED FILL — HYDROCODON-APAP 5-325: 5-325 | 3 days supply | Qty: 10 | Fill #0

## 2017-02-15 MED FILL — MUPIROCIN 2% OINTMENT: 2 | 7 days supply | Qty: 22 | Fill #0

## 2017-02-15 MED FILL — CEFPROZIL 500 MG TABLET: 500 | 10 days supply | Qty: 20 | Fill #0

## 2017-02-15 SURGICAL SUPPLY — 76 items
ATTRACTOMAT 16X20 MAGNETIC DRP (DRAPES) IMPLANT
BAG DECANTER FOR FLEXI CONT (MISCELLANEOUS) ×2 IMPLANT
BENZOIN TINCTURE PRP APPL 2/3 (GAUZE/BANDAGES/DRESSINGS) IMPLANT
BLADE CLIPPER SURG (BLADE) ×2 IMPLANT
BLADE NEEDLE 3 SS STRL (BLADE) ×2 IMPLANT
BUR ROUNG CUTTER 5.0 (BUR) ×2 IMPLANT
BUR SABER RD CUTTING 3.0 (BURR) ×2 IMPLANT
BUR SABER TAPERED DIAMOND 1 (BURR) ×2 IMPLANT
BUR SURG RND 2.0 DIAM (BURR) ×2 IMPLANT
CANISTER SUCT 1200ML W/VALVE (MISCELLANEOUS) ×2 IMPLANT
CORD BIPOLAR FORCEPS 12FT (ELECTRODE) ×2 IMPLANT
COTTONBALL LRG STERILE PKG (GAUZE/BANDAGES/DRESSINGS) ×2 IMPLANT
COVER PROBE 5X48 (MISCELLANEOUS) ×1
DECANTER SPIKE VIAL GLASS SM (MISCELLANEOUS) ×2 IMPLANT
DEPRESSOR TONGUE BLADE STERILE (MISCELLANEOUS) ×2 IMPLANT
DRAIN JACKSON RD 7FR 3/32 (WOUND CARE) IMPLANT
DRAIN PENROSE 1/4X12 LTX STRL (WOUND CARE) IMPLANT
DRAPE C-ARM 42X72 X-RAY (DRAPES) ×2 IMPLANT
DRAPE INCISE IOBAN 66X45 STRL (DRAPES) ×2 IMPLANT
DRAPE MICROSCOPE WILD 40.5X102 (DRAPES) ×2 IMPLANT
DRAPE SURG 17X23 STRL (DRAPES) ×2 IMPLANT
DRSG GLASSCOCK MASTOID ADT (GAUZE/BANDAGES/DRESSINGS) ×2 IMPLANT
ELECT COATED BLADE 2.86 ST (ELECTRODE) ×2 IMPLANT
ELECT PAIRED SUBDERMAL (MISCELLANEOUS) ×2
ELECT REM PT RETURN 9FT ADLT (ELECTROSURGICAL) ×2
ELECTRODE PAIRED SUBDERMAL (MISCELLANEOUS) ×1 IMPLANT
ELECTRODE REM PT RTRN 9FT ADLT (ELECTROSURGICAL) ×1 IMPLANT
GAUZE SPONGE 4X4 12PLY STRL (GAUZE/BANDAGES/DRESSINGS) ×2 IMPLANT
GAUZE SPONGE 4X4 16PLY XRAY LF (GAUZE/BANDAGES/DRESSINGS) ×2 IMPLANT
GLOVE ECLIPSE 7.5 STRL STRAW (GLOVE) ×4 IMPLANT
GLOVE SURG SS PI 7.0 STRL IVOR (GLOVE) ×2 IMPLANT
GOWN STRL REUS W/ TWL LRG LVL3 (GOWN DISPOSABLE) ×1 IMPLANT
GOWN STRL REUS W/ TWL XL LVL3 (GOWN DISPOSABLE) ×1 IMPLANT
GOWN STRL REUS W/TWL LRG LVL3 (GOWN DISPOSABLE) ×1
GOWN STRL REUS W/TWL XL LVL3 (GOWN DISPOSABLE) ×1
IMPL COCH SYNC FLEX24 SGL PROC (Prosthesis and Implant ENT) ×2 IMPLANT
IV NS 500ML (IV SOLUTION) ×1
IV NS 500ML BAXH (IV SOLUTION) ×1 IMPLANT
IV NS IRRIG 3000ML ARTHROMATIC (IV SOLUTION) ×2 IMPLANT
IV SET EXT 30 76VOL 4 MALE LL (IV SETS) ×2 IMPLANT
KIT CVR 48X5XPRB PLUP LF (MISCELLANEOUS) ×1 IMPLANT
KIT SONNET EAS TWO PROCESSOR (KITS) ×2 IMPLANT
MARKER SKIN DUAL TIP RULER LAB (MISCELLANEOUS) IMPLANT
NDL SAFETY ECLIPSE 18X1.5 (NEEDLE) ×1 IMPLANT
NEEDLE HYPO 18GX1.5 SHARP (NEEDLE) ×1
NEEDLE HYPO 22GX1.5 SAFETY (NEEDLE) ×2 IMPLANT
NEEDLE HYPO 25X1 1.5 SAFETY (NEEDLE) ×4 IMPLANT
NEEDLE SPNL 25GX3.5 QUINCKE BL (NEEDLE) ×4 IMPLANT
NS IRRIG 1000ML POUR BTL (IV SOLUTION) ×2 IMPLANT
PACK BASIN DAY SURGERY FS (CUSTOM PROCEDURE TRAY) ×2 IMPLANT
PACK ENT DAY SURGERY (CUSTOM PROCEDURE TRAY) ×2 IMPLANT
PATTIES SURGICAL .25X.25 (GAUZE/BANDAGES/DRESSINGS) ×2 IMPLANT
PENCIL BUTTON HOLSTER BLD 10FT (ELECTRODE) ×2 IMPLANT
PIN SAFETY STERILE (MISCELLANEOUS) IMPLANT
PROBE NERVBE PRASS .33 (MISCELLANEOUS) ×2 IMPLANT
SET IRRIG Y TYPE TUR BLADDER L (SET/KITS/TRAYS/PACK) ×2 IMPLANT
SLEEVE SCD COMPRESS KNEE MED (MISCELLANEOUS) ×2 IMPLANT
SPONGE NEURO XRAY DETECT 1X3 (DISPOSABLE) ×2 IMPLANT
SPONGE SURGIFOAM ABS GEL 100 (HEMOSTASIS) ×2 IMPLANT
SPONGE SURGIFOAM ABS GEL 12-7 (HEMOSTASIS) ×2 IMPLANT
STAPLER VISISTAT (STAPLE) ×2 IMPLANT
STRIP CLOSURE SKIN 1/2X4 (GAUZE/BANDAGES/DRESSINGS) ×2 IMPLANT
SUT BONE WAX W31G (SUTURE) ×2 IMPLANT
SUT CHROMIC 3 0 PS 2 (SUTURE) ×4 IMPLANT
SUT CHROMIC 3 0 SH 27 (SUTURE) ×2 IMPLANT
SUT ETHILON 5 0 PS 2 18 (SUTURE) IMPLANT
SUT NOVAFIL 5 0 BLK 18 IN P13 (SUTURE) IMPLANT
SYR 3ML 23GX1 SAFETY (SYRINGE) ×2 IMPLANT
SYR BULB 3OZ (MISCELLANEOUS) ×2 IMPLANT
SYR TB 1ML LL NO SAFETY (SYRINGE) ×4 IMPLANT
TOWEL OR 17X24 6PK STRL BLUE (TOWEL DISPOSABLE) ×6 IMPLANT
TOWEL OR NON WOVEN STRL DISP B (DISPOSABLE) ×2 IMPLANT
TRAY DSU PREP LF (CUSTOM PROCEDURE TRAY) ×2 IMPLANT
TRAY FOLEY BAG SILVER LF 14FR (SET/KITS/TRAYS/PACK) ×2 IMPLANT
TRAY FOLEY BAG SILVER LF 16FR (SET/KITS/TRAYS/PACK) ×2 IMPLANT
TUBING IRRIGATION (MISCELLANEOUS) ×2 IMPLANT

## 2017-02-15 NOTE — Anesthesia Preprocedure Evaluation (Addendum)
Anesthesia Evaluation  Patient identified by MRN, date of birth, ID band Patient awake    Reviewed: Allergy & Precautions, NPO status , Patient's Chart, lab work & pertinent test results  Airway Mallampati: II  TM Distance: >3 FB Neck ROM: Full    Dental  (+) Dental Advisory Given   Pulmonary former smoker,    breath sounds clear to auscultation       Cardiovascular hypertension, Pt. on medications + Peripheral Vascular Disease   Rhythm:Regular Rate:Normal     Neuro/Psych negative neurological ROS     GI/Hepatic Neg liver ROS, GERD  ,  Endo/Other  negative endocrine ROS  Renal/GU Renal disease     Musculoskeletal  (+) Arthritis ,   Abdominal   Peds  Hematology negative hematology ROS (+)   Anesthesia Other Findings   Reproductive/Obstetrics                             Lab Results  Component Value Date   WBC 5.7 12/09/2016   HGB 13.7 12/09/2016   HCT 40.8 12/09/2016   MCV 94.4 12/09/2016   PLT 169.0 12/09/2016   Lab Results  Component Value Date   CREATININE 1.59 (H) 02/10/2017   BUN 29 (H) 02/10/2017   NA 138 02/10/2017   K 4.0 02/10/2017   CL 104 02/10/2017   CO2 27 02/10/2017    Anesthesia Physical Anesthesia Plan  ASA: III  Anesthesia Plan: General   Post-op Pain Management:    Induction: Intravenous  PONV Risk Score and Plan: 3 and Ondansetron, Dexamethasone, Treatment may vary due to age or medical condition and Propofol infusion  Airway Management Planned: Oral ETT  Additional Equipment:   Intra-op Plan:   Post-operative Plan: Extubation in OR  Informed Consent: I have reviewed the patients History and Physical, chart, labs and discussed the procedure including the risks, benefits and alternatives for the proposed anesthesia with the patient or authorized representative who has indicated his/her understanding and acceptance.   Dental advisory  given  Plan Discussed with: CRNA  Anesthesia Plan Comments:        Anesthesia Quick Evaluation

## 2017-02-15 NOTE — Anesthesia Procedure Notes (Signed)
Procedure Name: Intubation Date/Time: 02/15/2017 8:48 AM Performed by: Lyndee Leo, CRNA Pre-anesthesia Checklist: Patient identified, Emergency Drugs available, Suction available and Patient being monitored Patient Re-evaluated:Patient Re-evaluated prior to induction Oxygen Delivery Method: Circle system utilized Preoxygenation: Pre-oxygenation with 100% oxygen Induction Type: IV induction Ventilation: Mask ventilation without difficulty Laryngoscope Size: Miller and 3 Tube type: Oral Rae Tube size: 8.0 mm Number of attempts: 1 Airway Equipment and Method: Stylet and Oral airway Placement Confirmation: ETT inserted through vocal cords under direct vision,  positive ETCO2 and breath sounds checked- equal and bilateral Tube secured with: Tape Dental Injury: Teeth and Oropharynx as per pre-operative assessment

## 2017-02-15 NOTE — Brief Op Note (Signed)
02/15/2017  11:54 AM  PATIENT:  [name]  83 y.o. male  PRE-OPERATIVE DIAGNOSIS:  Bilateral severe sensorineural hearing loss beyond hearing aids  POST-OPERATIVE DIAGNOSIS: Bilateral severe sensorineural hearing loss beyond hearing aids  PROCEDURE:  Procedure(s): RIGHT COCHLEAR IMPLANT (Right)  - MedEL Synchrony Mi1200 Pin + Flex24 electrode  SURGEON:  Surgeon(s) and Role:    Vicie Mutters, MD - Primary  PHYSICIAN ASSISTANT:   ASSISTANTS: Arlie Solomons, AuD who performed intraoperative electrical testing of the cochlear implant  ANESTHESIA:   general  EBL: < 20 ml BLOOD ADMINISTERED:none  DRAINS: none   LOCAL MEDICATIONS USED:  XYLOCAINE 65ml  SPECIMEN:  No Specimen  DISPOSITION OF SPECIMEN:  N/A  COUNTS:  YES  TOURNIQUET:  * No tourniquets in log *  DICTATION: .Other Dictation: Dictation Number [id number]  PLAN OF CARE: Admit for overnight observation  PATIENT DISPOSITION:  PACU - hemodynamically stable.   Delay start of Pharmacological VTE agent (>24hrs) due to surgical blood loss or risk of bleeding: not applicable

## 2017-02-15 NOTE — Transfer of Care (Signed)
Immediate Anesthesia Transfer of Care Note  Patient: Stephen Garrett  Procedure(s) Performed: RIGHT COCHLEAR IMPLANT (Right Ear)  Patient Location: PACU  Anesthesia Type:General  Level of Consciousness: sedated and responds to stimulation  Airway & Oxygen Therapy: Patient Spontanous Breathing and Patient connected to face mask oxygen  Post-op Assessment: Report given to RN and Post -op Vital signs reviewed and stable  Post vital signs: Reviewed and stable  Last Vitals:  Vitals:   02/15/17 0729 02/15/17 1154  BP: (!) 180/85 (!) 148/81  Pulse: 76 80  Resp: 15 14  Temp: 36.6 C   SpO2: 99% 100%    Last Pain:  Vitals:   02/15/17 0729  TempSrc: Oral  PainSc: 0-No pain         Complications: No apparent anesthesia complications

## 2017-02-15 NOTE — Anesthesia Postprocedure Evaluation (Signed)
Anesthesia Post Note  Patient: Stephen Garrett  Procedure(s) Performed: RIGHT COCHLEAR IMPLANT (Right Ear)     Patient location during evaluation: PACU Anesthesia Type: General Level of consciousness: awake and alert and oriented Pain management: pain level controlled Vital Signs Assessment: post-procedure vital signs reviewed and stable Respiratory status: spontaneous breathing, nonlabored ventilation and respiratory function stable Cardiovascular status: blood pressure returned to baseline and stable Postop Assessment: no apparent nausea or vomiting Anesthetic complications: no    Last Vitals:  Vitals:   02/15/17 1215 02/15/17 1230  BP: (!) 147/79 (!) 159/81  Pulse: 76 75  Resp: 14 14  Temp:    SpO2: 98% 95%    Last Pain:  Vitals:   02/15/17 1228  TempSrc:   PainSc: 0-No pain                 Yolanda Dockendorf A.

## 2017-02-15 NOTE — Progress Notes (Signed)
S: Awake, not complaining of pain, eating, no nausea or vomiting, foley draining, IV ok, some incisional bleeding reported  O: VS stable with exception of increased BP, facial function intact, no nystagmus, dressing in place  A: 1. Stable postop course 2. Findings reviewed with patient 3. Foley stable  P: 1. DC foley at 6am 2. Continue IVF, antibiotics,and steriods 3. Plan DC in morning with the patient's daughter.

## 2017-02-15 NOTE — Interval H&P Note (Signed)
History and Physical Interval Note:  02/15/2017 8:14 AM  Stephen Garrett  has presented today for surgery, with the diagnosis of severe bilateral sensorineural hearing loss beyond hearing aids. The various methods of treatment have been discussed with the patient and his daughter. After consideration of risks, benefits and other options for treatment, the patient has consented to  Procedure(s): RIGHT COCHLEAR IMPLANT (Right) with a MedEL Synchrony Mi1200 PIN + Flex24 electrode hybrid cochlear implant as a surgical intervention .  The patient's history has been reviewed, patient examined, no change in status, stable for surgery.  I have reviewed the patient's chart and labs.  Questions were answered to the patient's and daughter's satisfaction.  The patient does not report any fever, cough, or upper/lower respiratory tract infection during the last 3 days. He has made arrangements at his living facility to stay in the medical area for at least 24 hours post discharge. He has the option to stay for three days in the medical facility.   Vicie Mutters

## 2017-02-16 ENCOUNTER — Encounter (HOSPITAL_BASED_OUTPATIENT_CLINIC_OR_DEPARTMENT_OTHER): Payer: Self-pay | Admitting: Otolaryngology

## 2017-02-16 DIAGNOSIS — G2581 Restless legs syndrome: Secondary | ICD-10-CM | POA: Diagnosis not present

## 2017-02-16 DIAGNOSIS — Z87891 Personal history of nicotine dependence: Secondary | ICD-10-CM | POA: Diagnosis not present

## 2017-02-16 DIAGNOSIS — I10 Essential (primary) hypertension: Secondary | ICD-10-CM | POA: Diagnosis not present

## 2017-02-16 DIAGNOSIS — H905 Unspecified sensorineural hearing loss: Secondary | ICD-10-CM | POA: Diagnosis not present

## 2017-02-16 DIAGNOSIS — N400 Enlarged prostate without lower urinary tract symptoms: Secondary | ICD-10-CM | POA: Diagnosis not present

## 2017-02-16 DIAGNOSIS — E785 Hyperlipidemia, unspecified: Secondary | ICD-10-CM | POA: Diagnosis not present

## 2017-02-16 NOTE — Discharge Summary (Signed)
Physician Discharge Summary  Patient ID: Stephen Garrett MRN: [id number] DOB/AGE: Oct 19, 1923 83 y.o.  Admit date: 02/15/2017 Discharge date: 02/16/2017  Admission Diagnoses: Severe bilateral sensorineural hearing loss beyond hearing aids  Discharge Diagnoses: Severe bilateral sensorineural hearing loss beyond hearing aids  Operation: Right MedEL Synchrony Mi1200 PIN + Flex24 cochlear implant  Complications None  Discharged Condition:  Stable, discharged with his daughter  Hospital Course: Adm,itted to [facility] preoperative area. Evaluated by anesthesiologist. Taken to [facility] #2. Underwent an uncomplicated right MedEL cochlear implant, as above, with a Flex24 electrode, through a cochleostomy approach. He was found to have a very constricted round window membrane, 25% of normal size. A full length insertion was accomplished over 2 min. by the clock. Intraoperative electrical testing revealed normal electrode impedances and functioning electrodes. An intraoperative C-Arm x-ray demonstrated proper positioning of the implant within the right cochlea. He was recovered in the PACU without difficulty and was transferred to the [facility] for overnight observation. A foley catheter was left in place until 6am on 02-16-17 as he had had prior in ability to void following a previous back procedure. He was awake, alert and eating the night of the procedure. Facial function was intact and symmetric. There was no nystagmus. The right CI postauricular incision was stable. He denied nausea or vomiting. He remained in stable condition throughout the night and when evaluated on the morning of 02-16-17. His foley catheter was removed at 6 am. He was given his final doses of ancef and decadron and was scheduled for discharge with is daughter at approximately 9am. He was discharged in stable condition. He will return to my office on 02-22-17 for follow up and suture removal. CI activation will be 4-6 wks.  postop. There were no complications.  Consults: None  Significant Diagnostic Studies: Intraoperative CI electrical testing by Arlie Solomons, AuD., showed all electrode impedances WNL and electrodes functioning. Intraoperative C-arm x-ray documented proper placement of the eletrode array within the right cochlea.  Treatments: surgery: Right MedEL Synchrony Mi1200 PIN + Flex24 electrode cochlear implant.  Discharge Exam: Blood pressure (!) 152/80, pulse 73, temperature (!) 97 F (36.1 C), resp. rate 18, height 5' 6.5" (1.689 m), weight 84.6 kg (186 lb 9.6 oz), SpO2 94 %. See above hospital summary for physical exam  Disposition: Discharged in stable condition on the morning of 02-16-17 with his daughter.  Diet Regular  Room Locations : [facility] Rm#4, OR Rm#2, PACU, RCC Rm #3.  Activity :Quiet indoor activity x 1 wk., head elevation on two pillows x 1 wk, water precautions x 1 wk    Follow-up Information    Vicie Mutters, MD In 1 week.   Specialty:  Otolaryngology Why:  Follow up with Dr. Thornell Mule in his office on 02-22-17: Call [phone]  Contact information: [place], [place] [address] [phone]          Discharge Status: Stable  Signed: Vicie Mutters 02/16/2017, 8:54 AM

## 2017-02-16 NOTE — Progress Notes (Signed)
S: Quiet night, foley removed at 6 am., awake and alert, wants to go home, no nausea or vomiting  O: VS stable, BP elevated throughout course, facial function intact and symmetric, no nystagmus      Incision stable, some ecchymosis as expected inferiorly, no hematoma  A: 1. Stable. Ok for DC this am after voids and receives final doses of ancef and decadron  P: 1. Final doses of ancef and decadron this am 2. DC this am with daughter 3. RT office in one week for follow up. 4. Regular diet, elevate HOB 30 degrees x 3 days, water precautions, quiet indoor activity x 1 wk 5. DC meds:     A. Cefzil 500 mg PO BID x 10 days     B. Norco 5/325 1 PO Q4hrs. Prn pain x 3 days (#10)     C. Mupirocin oint - apply to incision BID x 1 wk 6. DC status = stable

## 2017-02-16 NOTE — Op Note (Signed)
NAME:  Stephen Garrett, Stephen Garrett                    ACCOUNT NO.:  MEDICAL RECORD NO.:  [id number]  LOCATION:                                 FACILITY:  PHYSICIAN:  Fannie Knee, M.D.         DATE OF BIRTH:  DATE OF PROCEDURE: 02-15-2017 DATE OF DISCHARGE: 02-16-2017                              OPERATIVE REPORT   JUSTIFICATION FOR PROCEDURE:  Stephen Garrett is a 83 year old white Male who is here today for a right cochlear implant to treat progressive bilateral severe sensorineural hearing losses beyond hearing aids.  The patient states that his hearing has declined over many years. He is now having difficulty hearing people who are directly in front of him despite wearing 2 hearing aids and trying to speech read.  He also had significant difficulty hearing in background noise.  He has been using a [employer] phone.  He denied otorrhea, otalgia, tinnitus, or vertigo.  He had a negative family history for hearing loss.  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  He underwent a cochlear implant evaluation and was found to be a good candidate.  He was recommended for a right MED-EL Synchrony Mi1200 PIN plus Flex 24 electrode hybrid device so that he could have an MRI scan in the future if needed and attempt to preserve some residual hearing. Risks, complications, and alternatives of cochlear implantation were explained to him and to his daughter.  Questions were invited and answered.  He read our cochlear implant informed consent Form, he signed the form, and it was witnessed.  He was given a copy of the informed consent.  Preop teaching and counseling were provided.  JUSTIFICATION FOR OUTPATIENT SETTING:  Patient's age and need for general endotracheal anesthesia.  JUSTIFICATION FOR OVERNIGHT STAY: 1. 23 hours of observation following cochlear  implantation. 2. Patient's age of 22 years.  SURGEON:  Fannie Knee, MD.  ANESTHESIA:  General endotracheal.  Dr. Ola Spurr.  CRNA, Johny Shock.  COMPLICATIONS:  None.  DISCHARGE STATUS:  Stable.  SUMMARY OF REPORT:  After the patient was taken to operating room #2 at [facility], he was placed in the supine position.  An IV had been begun in the holding area.  General IV induction was performed by Dr. Ola Spurr.  The patient was orally intubated without difficulty by Johny Shock, CRNA.  He was properly positioned and monitored. Elbows and ankles were padded with foam rubber, and I initiated a time- out at 8:50 a.m.  The patient was then turned 90 degrees counter-clockwise.  A small amount of hair was clipped in the right postauricular areas.  Hair was Taped, and stockinette cap was applied.  A right lazy-S cochlear implant incision was marked with the aid of MED-EL BTE template and a MED-EL dummy ICS.  The incision area was then infiltrated with 5 mL of 1% Xylocaine with 1:100,000 epinephrine.  A #6 ear speculum was placed in the right ear canal.  A transtympanic  injection of dexamethasone was then performed.  1 mL of dexamethasone 10 mg/mL was injected into the right middle ear.  Silver wire needle electrodes were then inserted into the right orbicularis oris and oculi muscles.  Ground and reference electrodes were inserted into the suprasternal notch area.  The NIM was checked, and it was found to be functioning well.  The patient's right ear, hemiface, and scalp were then prepped with Betadine and were draped in the standard fashion for a cochlear implant.  A right postauricular incision was then made and carried down through skin and subcutaneous tissue.  A T-shaped incision was then made in the musculoperiosteal layer, and anterior and posterior subperiosteal flaps were elevated.  The mastoid cortex was exposed.  The mastoid cortex was then removed  with a #5 cutting bur and a Stryker S2 drill using continuous suction irrigation.  The mastoid was found to be normally pneumatized.  The antrum was opened as was the attic, and the incus was identified in the fossa incudis.  A #2 cutting bur was then used to begin opening the facial recess. The facial recess was fully opened using a #2 diamond bur.  The facial nerve was stimulated prior to opening the facial recess and stimulated at 1 milliamp through the bone.  The nerve was not exposed during the procedure.  The chorda tympani nerve was not disturbed.  The round window niche was then opened by removing its bony overhang.  The round window niche itself was found to be small and constricted. The round window membrane was only 25% of normal Diameter and was not large enough to accept the diameter of the electrode array. Therefore, a 1-mm diamond bur was used to create an early cochleostomy.  The bone was removed down to endostium.  A disk of Gelfoam soaked in Decadron 10 mg/mL was then placed against the round window membrane and attention was turned to the temporoparietal skull.  An incision was then made in the musculoperiosteal layer.  The previously marked site for the ICS was found on the skull and a bony well was drilled to a depth of 2 mm using a MED-EL template.  Two holes were drilled for the pins using a 1-mm diamond bur.  A trough was drilled from the inferior portion of the bony well to the posterior-superior portion of the mastoid cavity that had been previously undercut. Skin flap thickness was measured at 6 mm. with MedEL calipers.  A subcutaneous pocket was then created for the ICS with the aid of the dummy electrode.  The site was then copiously irrigated with bacitracin-containing saline, and bleeding was controlled with unipolar cautery.    Gloves were changed.  A MED-EL Synchrony Mi1200 pin plus Flex 24 electrode, serial [id number] was then opened under saline and  inspected.  The device appeared to be intact. Decadron 10 mg/mL was then dripped down the sheath covering the electrode array to coat the electrode array with Decadron.  The ICS was then placed in its subcutaneous pocket, and the anterior portion was placed in the bony well with the pins engaged in the holes that had been previously drilled.  A musculoperiosteal flap was then sutured lateral to the ICS with interrupted 3-0 chromics to stabilize the implant solidly.  The electrode array was placed in the trough.  Attention was then turned to the cochleostomy site.  The cochleostomy was further deepened with a 1-mm diamond bur and then a straight Kos pick was used to dissect the  endostium away from the opening.  The cochleostomy was enlarged slightly to accept the electrode array.  The electrode array was then secured with an offset forceps, and the tip of the electrode was directed into the cochleostomy using a claw.  The electrode array was then very gently inserted into the cochlear over a 2- minute period by the clock.  Full-length insertion was obtained, and there was no resistance during the insertion.  Fascia rectangles were then packed around the cochleostomy to seal the cochleostomy with tissue, and the facial recess was obliterated with a muscle plug.  The electrode array was coiled into the mastoid cavity and held in place with Gelfoam discs and 3 Gel-Foam rectangles soaked in Cipro HC.  The musculoperiosteal layer was then closed with interrupted 3- 0 chromics.  The site was irrigated with bacitracin-containing saline. Hemostasis was obtained with bipolar cautery.  Subcutaneous layer was closed with interrupted inverted 3-0 chromics for a watertight closure.  Arlie Solomons, AUD, my audiologist, then proceeded to evaluate the implant with MED-EL software.  All impedances were within normal limits, and the electrodes, particularly the lower electrodes were functioning  well.  Several of the higher electrodes were not giving her responses, however, she did not turn the sound up very loudly.  After it was deemed that the electrode array was functional, the information was saved.  An intraoperative C-arm x-ray was then taken and showed the electrode array to be within good position within the cochlea.  Skin was then closed with interrupted inverted 3-0 chromics and skin staples.  Bacitracin ointment was applied.  The ear was padded with Telfa and cotton. A standard adult Glasscock mastoid dressing was applied loosely in the standard fashion.  The patient was then awakened, extubated, and transferred to his hospital bed.  He appeared to tolerate both the general endotracheal anesthesia and the procedure well. He left the operating room in stable condition.  Summary:  1. TOTAL FLUIDS:  750 mL. 2. TOTAL BLOOD LOSS:  Less than 20 mL. 3. TOTAL URINE OUTPUT:  500 mL. 4. COUNTS:  Sponge, needle, and cotton ball counts were correct at the termination of the procedure. 5. SPECIMENS:  No specimens were sent to Pathology.  Mr. Hunley will be admitted to the PACU, then will be transferred to the 23-hour [facility] overnight stay unit for 23 hours of observation.  If stable overnight, will be discharged in the morning of February 16, 2017, with his daughter back to his assisted living area. He will stay for 24 hours in their medical facility.  He will be instructed to keep his head elevated, avoid aspirin or aspirin products, avoid direct pressure over the implant site for the next week. He is to follow a regular diet.    He will be instructed to return to my office on February 22, 2017, at 2:10 p.m. for followup and staple removal.  Activation of the device will occur in 4-6 weeks depending on healing.  DISCHARGE MEDICATIONS:  Include: 1. Cefzil 500 mg p.o. b.i.d. x10 days with food. 2. Norco 5/325 one p.o. q.4 hours p.r.n. pain for 3 days, #10 tabs. 3.  Mupirocin ointment to be applied to the incision b.i.d. x1 week. 4. He is to take all of his other home medications as per his normal regimen.       The patient will be instructed to call [phone] for any postoperative problems directly related to the procedure.  He will be given both verbal and written instructions.  SUMMARY:  The patient underwent an uncomplicated right cochlear implant. He was implanted with a Hybrid MED-EL Synchrony Mi1200 PIN plus Flex 24 Electrode, serial [id number]..  A full-length insertion was accomplished.  Intraoperative electrical testing revealed all impedances were within normal limits, and electrodes were functioning. An intraoperative C-arm x-ray showed good placement of the electrode array within the cochlea.  Sponge, needle, and cotton ball counts were correct at the termination of the procedure. Facial nerve was not exposed and stimulated at 1 milliamp at the vertical descending portion during opening of the facial recess and throughout the procedure. The chorda tympani nerve was preserved.  Again, there were no complications.  He tolerated the procedure well.     Fannie Knee, M.D.    EMK/MEDQ  D:  02/15/2017  T:  02/16/2017  Job:  [id number]

## 2017-02-16 NOTE — Discharge Instructions (Signed)
°  Post Anesthesia Home Care Instructions  Activity: Get plenty of rest for the remainder of the day. A responsible individual must stay with you for 24 hours following the procedure.  For the next 24 hours, DO NOT: -Drive a car -Paediatric nurse -Drink alcoholic beverages -Take any medication unless instructed by your physician -Make any legal decisions or sign important papers.  Meals: Start with liquid foods such as gelatin or soup. Progress to regular foods as tolerated. Avoid greasy, spicy, heavy foods. If nausea and/or vomiting occur, drink only clear liquids until the nausea and/or vomiting subsides. Call your physician if vomiting continues.  Special Instructions/Symptoms: Your throat may feel dry or sore from the anesthesia or the breathing tube placed in your throat during surgery. If this causes discomfort, gargle with warm salt water. The discomfort should disappear within 24 hours.  If you had a scopolamine patch placed behind your ear for the management of post- operative nausea and/or vomiting:  1. The medication in the patch is effective for 72 hours, after which it should be removed.  Wrap patch in a tissue and discard in the trash. Wash hands thoroughly with soap and water. 2. You may remove the patch earlier than 72 hours if you experience unpleasant side effects which may include dry mouth, dizziness or visual disturbances. 3. Avoid touching the patch. Wash your hands with soap and water after contact with the patch.   Dr. Thornell Mule' DC instructions: Follow all instructions given to you on the yellow postoperative instructions sheet.  1. Final doses of ancef and decadron this am 2. DC this am with daughter 3. RT office in one week for follow up. Call [phone] for any questions or concerns. 4. Regular diet, elevate HOB 30 degrees x 3 days, water precautions, quiet indoor activity x 1 wk 5. DC meds:     A. Cefzil 500 mg PO BID x 10 days     B. Norco 5/325 1 PO Q4hrs.  Prn pain x 3 days (#10)     C. Mupirocin oint - apply to incision BID x 1 wk 6. DC status = stable

## 2017-02-17 ENCOUNTER — Telehealth: Payer: Self-pay

## 2017-02-17 NOTE — Telephone Encounter (Signed)
PLEASE NOTE: All timestamps contained within this report are represented as [place] Standard Time. CONFIDENTIALTY NOTICE: This fax transmission is intended only for the addressee. It contains information that is legally privileged, confidential or otherwise protected from use or disclosure. If you are not the intended recipient, you are strictly prohibited from reviewing, disclosing, copying using or disseminating any of this information or taking any action in reliance on or regarding this information. If you have received this fax in error, please notify us immediately by telephone so that we can arrange for its return to [place]. Phone: [phone], Toll-Free: [phone], Fax: [phone] Page: 1 of 1 Call Id: [id number] [place] Night - Client Nonclinical Telephone Record [place] Night - Client Client Site [place] Physician Viviana Simpler - MD Contact Type Call Who Is Calling Patient / Member / Family / Caregiver Caller Name Konstantin Lehnen Caller Phone Number [phone] Patient Name Stephen Garrett Patient DOB 1924/01/25 Call Type Message Only Information Provided Reason for Call Request for General Office Information Initial Comment Caller states her name is Verdis Frederickson from [facility]. Pt is suppose to be on respite for 24 hours, but he is refusing to stay.Facility is having security take him home. Additional Comment No callback needed. Call Closed By: Chilton Greathouse Transaction Date/Time: 02/16/2017 11:34:10 PM (ET)

## 2017-02-17 NOTE — Telephone Encounter (Signed)
This has been adressed

## 2017-02-17 NOTE — Telephone Encounter (Signed)
PLEASE NOTE: All timestamps contained within this report are represented as [place] Standard Time. CONFIDENTIALTY NOTICE: This fax transmission is intended only for the addressee. It contains information that is legally privileged, confidential or otherwise protected from use or disclosure. If you are not the intended recipient, you are strictly prohibited from reviewing, disclosing, copying using or disseminating any of this information or taking any action in reliance on or regarding this information. If you have received this fax in error, please notify us immediately by telephone so that we can arrange for its return to [place]. Phone: [phone], Toll-Free: [phone], Fax: [phone] Page: 1 of 1 Call Id: [id number] [place] Night - Client Nonclinical Telephone Record [place] Night - Client Client Site [place] Physician Viviana Simpler - MD Contact Type Call Who Is Calling Patient / Member / Family / Caregiver Caller Name Leyton Magoon Caller Phone Number [phone] Patient Name Stephen Garrett Patient DOB 04/19/1923 Call Type Message Only Information Provided Reason for Call Request for General Office Information Initial Comment Caller states her name is Verdis Frederickson from [facility]. Pt is suppose to on respite but he is refusing to stay. Additional Comment No callback needed. Call Closed By: Chilton Greathouse Transaction Date/Time: 02/16/2017 11:34:10 PM (ET)

## 2017-02-27 DIAGNOSIS — H34832 Tributary (branch) retinal vein occlusion, left eye, with macular edema: Secondary | ICD-10-CM | POA: Diagnosis not present

## 2017-03-28 DIAGNOSIS — H903 Sensorineural hearing loss, bilateral: Secondary | ICD-10-CM | POA: Diagnosis not present

## 2017-04-04 DIAGNOSIS — H34832 Tributary (branch) retinal vein occlusion, left eye, with macular edema: Secondary | ICD-10-CM | POA: Diagnosis not present

## 2017-04-08 ENCOUNTER — Encounter: Payer: Self-pay | Admitting: Internal Medicine

## 2017-04-10 ENCOUNTER — Other Ambulatory Visit: Payer: Self-pay | Admitting: Internal Medicine

## 2017-04-11 DIAGNOSIS — H903 Sensorineural hearing loss, bilateral: Secondary | ICD-10-CM | POA: Diagnosis not present

## 2017-04-12 DIAGNOSIS — L570 Actinic keratosis: Secondary | ICD-10-CM | POA: Diagnosis not present

## 2017-04-12 DIAGNOSIS — L821 Other seborrheic keratosis: Secondary | ICD-10-CM | POA: Diagnosis not present

## 2017-04-12 DIAGNOSIS — Z85820 Personal history of malignant melanoma of skin: Secondary | ICD-10-CM | POA: Diagnosis not present

## 2017-04-12 DIAGNOSIS — Z85828 Personal history of other malignant neoplasm of skin: Secondary | ICD-10-CM | POA: Diagnosis not present

## 2017-04-12 DIAGNOSIS — D225 Melanocytic nevi of trunk: Secondary | ICD-10-CM | POA: Diagnosis not present

## 2017-04-12 DIAGNOSIS — X32XXXA Exposure to sunlight, initial encounter: Secondary | ICD-10-CM | POA: Diagnosis not present

## 2017-04-14 DIAGNOSIS — H903 Sensorineural hearing loss, bilateral: Secondary | ICD-10-CM | POA: Diagnosis not present

## 2017-05-02 DIAGNOSIS — H903 Sensorineural hearing loss, bilateral: Secondary | ICD-10-CM | POA: Diagnosis not present

## 2017-05-13 ENCOUNTER — Encounter: Payer: Self-pay | Admitting: Internal Medicine

## 2017-05-15 ENCOUNTER — Other Ambulatory Visit: Payer: Self-pay | Admitting: Internal Medicine

## 2017-05-15 MED ORDER — FUROSEMIDE 40 MG PO TABS: 40.0000 mg | ORAL_TABLET | Freq: Every day | ORAL | 1 refills | Status: DC | PRN

## 2017-05-15 NOTE — Telephone Encounter (Signed)
It is no longer on his medication list. Will have to forward to Dr Silvio Pate.

## 2017-05-16 DIAGNOSIS — H34832 Tributary (branch) retinal vein occlusion, left eye, with macular edema: Secondary | ICD-10-CM | POA: Diagnosis not present

## 2017-05-29 ENCOUNTER — Other Ambulatory Visit: Payer: Self-pay | Admitting: Internal Medicine

## 2017-05-31 DIAGNOSIS — H903 Sensorineural hearing loss, bilateral: Secondary | ICD-10-CM | POA: Diagnosis not present

## 2017-06-06 ENCOUNTER — Ambulatory Visit (INDEPENDENT_AMBULATORY_CARE_PROVIDER_SITE_OTHER): Payer: Medicare Other | Admitting: Internal Medicine

## 2017-06-06 ENCOUNTER — Encounter: Payer: Self-pay | Admitting: Internal Medicine

## 2017-06-06 VITALS — BP 132/82 | HR 83 | Temp 97.3°F | Ht 65.5 in | Wt 180.2 lb

## 2017-06-06 DIAGNOSIS — N183 Chronic kidney disease, stage 3 unspecified: Secondary | ICD-10-CM

## 2017-06-06 DIAGNOSIS — G2581 Restless legs syndrome: Secondary | ICD-10-CM | POA: Diagnosis not present

## 2017-06-06 DIAGNOSIS — I10 Essential (primary) hypertension: Secondary | ICD-10-CM

## 2017-06-06 DIAGNOSIS — I872 Venous insufficiency (chronic) (peripheral): Secondary | ICD-10-CM

## 2017-06-06 DIAGNOSIS — F39 Unspecified mood [affective] disorder: Secondary | ICD-10-CM

## 2017-06-06 DIAGNOSIS — N401 Enlarged prostate with lower urinary tract symptoms: Secondary | ICD-10-CM

## 2017-06-06 DIAGNOSIS — Z0000 Encounter for general adult medical examination without abnormal findings: Secondary | ICD-10-CM | POA: Diagnosis not present

## 2017-06-06 DIAGNOSIS — N138 Other obstructive and reflux uropathy: Secondary | ICD-10-CM | POA: Diagnosis not present

## 2017-06-06 DIAGNOSIS — Z7189 Other specified counseling: Secondary | ICD-10-CM

## 2017-06-06 LAB — RENAL FUNCTION PANEL
Albumin: 4 g/dL (ref 3.5–5.2)
BUN: 27 mg/dL — ABNORMAL HIGH (ref 6–23)
CO2: 34 mEq/L — ABNORMAL HIGH (ref 19–32)
Calcium: 9.7 mg/dL (ref 8.4–10.5)
Chloride: 99 mEq/L (ref 96–112)
Creatinine, Ser: 1.62 mg/dL — ABNORMAL HIGH (ref 0.40–1.50)
GFR: 42.59 mL/min — ABNORMAL LOW (ref >60.00)
Glucose, Bld: 88 mg/dL (ref 70–99)
Phosphorus: 3.3 mg/dL (ref 2.3–4.6)
Potassium: 3.2 mEq/L — ABNORMAL LOW (ref 3.5–5.1)
Sodium: 140 mEq/L (ref 135–145)

## 2017-06-06 LAB — CBC
HCT: 41.1 % (ref 39.0–52.0)
Hemoglobin: 14.2 g/dL (ref 13.0–17.0)
MCHC: 34.6 g/dL (ref 30.0–36.0)
MCV: 90.6 fl (ref 78.0–100.0)
Platelets: 172 10*3/uL (ref 150.0–400.0)
RBC: 4.53 Mil/uL (ref 4.22–5.81)
RDW: 14.2 % (ref 11.5–15.5)
WBC: 7.3 10*3/uL (ref 4.0–10.5)

## 2017-06-06 LAB — T4, FREE: Free T4: 0.77 ng/dL (ref 0.60–1.60)

## 2017-06-06 NOTE — Assessment & Plan Note (Signed)
Mostly dysthymia Does okay with the bupropion

## 2017-06-06 NOTE — Assessment & Plan Note (Signed)
Voids okay on dual therapy

## 2017-06-06 NOTE — Assessment & Plan Note (Signed)
Sleeps okay with the clonazepam

## 2017-06-06 NOTE — Assessment & Plan Note (Signed)
I have personally reviewed the Medicare Annual Wellness questionnaire and have noted 1. The patient's medical and social history 2. Their use of alcohol, tobacco or illicit drugs 3. Their current medications and supplements 4. The patient's functional ability including ADL's, fall risks, home safety risks and hearing or visual             impairment. 5. Diet and physical activities 6. Evidence for depression or mood disorders  The patients weight, height, BMI and visual acuity have been recorded in the chart I have made referrals, counseling and provided education to the patient based review of the above and I have provided the pt with a written personalized care plan for preventive services.  I have provided you with a copy of your personalized plan for preventive services. Please take the time to review along with your updated medication list.  UTD on imms---yearly flu vaccine No cancer screening due to age Discussed exercise/weight training

## 2017-06-06 NOTE — Assessment & Plan Note (Signed)
See social history 

## 2017-06-06 NOTE — Progress Notes (Signed)
Hearing Screening Comments: Pt wears hearing aids Vision Screening Comments: Completed at [facility] 5-6wks ago per pt

## 2017-06-06 NOTE — Assessment & Plan Note (Signed)
BP Readings from Last 3 Encounters:  06/06/17 132/82  02/16/17 (!) 147/83  12/09/16 138/86   Good control No change needed

## 2017-06-06 NOTE — Progress Notes (Signed)
Subjective:    Patient ID: Stephen Garrett, male    DOB: 1923-06-14, 83 y.o.   MRN: [id number]  HPI Here for Medicare wellness and follow up of chronic medical conditions Reviewed form and advanced directives Reviewed other doctors Glass of wine or rare bloody [facility] No tobacco Regular exercise No falls--but some balance issues Chronic mild mood issues Independent with instrumental ADLs Left eye vision is poor--ongoing injection for vein occlusion Mild memory problems--no functional changes  Had the cochlear implant on the right Maybe just slight help but not much Wears hearing aide also on left May not be at peak yet  Still plays tennis Also does some resistance training No chest pain or SOB Mild chronic edema in left ankle--uses the furosemide prn No dizziness or syncope No palpitations Continues on ARB for BP and known CKD3  Sleep is still not great Up every 2 hours--nocturia Generally able to get to sleep again RLS controlled with the clonazepam --usually just once  Episodic mood problems--- will get depressed mood for 1-2 days (or rarely 3) He is able to work through it (psychiatric help in the past) No major anxiety--mild only  No sig back or joint pain Can still play singles tennis for 45 minutes--doubles for 2 hours No claudication  Voids okay Flow is relatively good  Current Outpatient Medications on File Prior to Visit  Medication Sig Dispense Refill  . amLODipine (NORVASC) 5 MG tablet Take 1 tablet (5 mg total) by mouth daily. (Patient taking differently: Take 2.5 mg by mouth daily. ) 90 tablet 3  . buPROPion (WELLBUTRIN XL) 150 MG 24 hr tablet TAKE ONE TABLET BY MOUTH EVERY DAY 90 tablet 3  . clonazePAM (KLONOPIN) 0.5 MG tablet TAKE 1 OR 2 TABLETS BY MOUTH AT BEDTIME 180 tablet 0  . finasteride (PROSCAR) 5 MG tablet TAKE ONE TABLET BY MOUTH EVERY DAY 90 tablet 3  . furosemide (LASIX) 40 MG tablet Take 1 tablet (40 mg total) by mouth daily as needed. 30  tablet 1  . Glucosamine-Chondroitin (GLUCOSAMINE CHONDR COMPLEX PO) Take by mouth daily.    Marland Kitchen losartan-hydrochlorothiazide (HYZAAR) 100-25 MG tablet TAKE ONE TABLET BY MOUTH EVERY DAY 90 tablet 3  . Multiple Vitamin (MULTIVITAMIN) capsule Take 1 capsule by mouth daily.      Marland Kitchen omeprazole (PRILOSEC) 40 MG capsule TAKE ONE CAPSULE BY MOUTH EVERY DAY 90 capsule 3  . pramipexole (MIRAPEX) 1 MG tablet TAKE 1 OR 2 TABLETS AT BEDTIME AS NEEDED 180 tablet 3  . pyridoxine (B-6) 100 MG tablet Take 100 mg by mouth daily.    . tamsulosin (FLOMAX) 0.4 MG CAPS capsule Take 1 capsule (0.4 mg total) by mouth daily. 90 capsule 3  . [DISCONTINUED] citalopram (CELEXA) 10 MG tablet Take 1 tablet (10 mg total) by mouth daily. 30 tablet 5   No current facility-administered medications on file prior to visit.     Allergies  Allergen Reactions  . Atorvastatin     REACTION: raises blood pressure  . Pravastatin Sodium     REACTION: raises blood pressure  . Statins     REACTION: raises bp    Past Medical History:  Diagnosis Date  . Actinic keratosis   . BPH (benign prostatic hypertrophy)   . Diseases of lips   . Diverticulosis of colon 06/2003  . ED (erectile dysfunction)   . GERD (gastroesophageal reflux disease)   . Heart murmur    as child  . HLD (hyperlipidemia)   . HOH (  hard of hearing)   . HTN (hypertension)   . Osteoarthritis of knee    severe, bilat  . Renal insufficiency   . Restless legs syndrome (RLS)   . Sleep disturbance, unspecified   . Spinal stenosis, lumbar region, without neurogenic claudication     Past Surgical History:  Procedure Laterality Date  . CATARACT EXTRACTION, BILATERAL  2011   Dr. Thomasene Ripple  . COCHLEAR IMPLANT Right 02/15/2017   Procedure: RIGHT COCHLEAR IMPLANT;  Surgeon: Vicie Mutters, MD;  Location: [place];  Service: ENT;  Laterality: Right;  . FINGER SURGERY     Right 4th finger  . [facility]  10/2009   Dr. Bary Castilla  . LUMBAR SPINE SURGERY  06/2005     stenosis repair  . TONSILLECTOMY  childhood  . TOTAL KNEE ARTHROPLASTY  06/2008   bilat (Dr. Marry Guan)    Family History  Problem Relation Age of Onset  . Other Father        "clogged arteries"  . Hypertension Mother     Social History   Socioeconomic History  . Marital status: Widowed    Spouse name: Not on file  . Number of children: 2  . Years of education: Not on file  . Highest education level: Not on file  Occupational History  . Occupation: Retired  Scientific laboratory technician  . Financial resource strain: Not on file  . Food insecurity:    Worry: Not on file    Inability: Not on file  . Transportation needs:    Medical: Not on file    Non-medical: Not on file  Tobacco Use  . Smoking status: Former Smoker    Last attempt to quit: 02/28/1970    Years since quitting: 47.3  . Smokeless tobacco: Never Used  Substance and Sexual Activity  . Alcohol use: Yes    Alcohol/week: 0.0 oz    Comment: 1-2 glasses wine/daily  . Drug use: No  . Sexual activity: Not on file  Lifestyle  . Physical activity:    Days per week: Not on file    Minutes per session: Not on file  . Stress: Not on file  Relationships  . Social connections:    Talks on phone: Not on file    Gets together: Not on file    Attends religious service: Not on file    Active member of club or organization: Not on file    Attends meetings of clubs or organizations: Not on file    Relationship status: Not on file  . Intimate partner violence:    Fear of current or ex partner: Not on file    Emotionally abused: Not on file    Physically abused: Not on file    Forced sexual activity: Not on file  Other Topics Concern  . Not on file  Social History Narrative   Widowed 2010   2 adopted children   Has living will   Son is health care power of attorney   Would accept resuscitation but no prolonged ventilation   No tube feeds if cognitively unaware   Review of Systems  No headaches Appetite is fine Weight down  slightly---trying to lose some weight Wears seat belt Teeth are okay--- keeps up with dentist Some constipation--- takes miralax daily (usually helps). No blood No skin problems--sees derm regularly No heartburn or dysphagia    Objective:   Physical Exam  Constitutional: He is oriented to person, place, and time. He appears well-developed. No distress.  HENT:  Mouth/Throat: Oropharynx is clear and moist. No oropharyngeal exudate.  Neck: No thyromegaly present.  Cardiovascular: Normal rate, regular rhythm and normal heart sounds. Exam reveals no gallop.  No murmur heard. Very faint pedal pulses  Pulmonary/Chest: Effort normal and breath sounds normal. No respiratory distress. He has no wheezes. He has no rales.  Abdominal: Soft. There is no tenderness.  Musculoskeletal: He exhibits no tenderness.  Trace ankle edema--- L>R  Lymphadenopathy:    He has no cervical adenopathy.  Neurological: He is alert and oriented to person, place, and time.  President-- "Trump, 2 or 3 of them...." [id number] D-l-[id number]-w Recall 3/3  Skin: No rash noted. No erythema.  Psychiatric: He has a normal mood and affect. His behavior is normal.          Assessment & Plan:

## 2017-06-06 NOTE — Assessment & Plan Note (Signed)
Very slight Urged him to limit furosemide to only severe swelling in AM

## 2017-06-06 NOTE — Patient Instructions (Signed)
You can try adding 1-2 senna-S to the miralax daily to keep your bowels more regular.

## 2017-06-06 NOTE — Assessment & Plan Note (Signed)
On ARB Will recheck labs

## 2017-06-07 ENCOUNTER — Encounter: Payer: Self-pay | Admitting: Internal Medicine

## 2017-06-11 ENCOUNTER — Encounter: Payer: Self-pay | Admitting: Internal Medicine

## 2017-06-12 DIAGNOSIS — H903 Sensorineural hearing loss, bilateral: Secondary | ICD-10-CM | POA: Diagnosis not present

## 2017-06-27 ENCOUNTER — Other Ambulatory Visit: Payer: Self-pay | Admitting: Internal Medicine

## 2017-06-27 DIAGNOSIS — H34832 Tributary (branch) retinal vein occlusion, left eye, with macular edema: Secondary | ICD-10-CM | POA: Diagnosis not present

## 2017-06-28 NOTE — Telephone Encounter (Signed)
Last filled 01-23-17 #180 Last OV 06-06-17 No Future OV

## 2017-08-01 DIAGNOSIS — H34832 Tributary (branch) retinal vein occlusion, left eye, with macular edema: Secondary | ICD-10-CM | POA: Diagnosis not present

## 2017-08-01 DIAGNOSIS — H5462 Unqualified visual loss, left eye, normal vision right eye: Secondary | ICD-10-CM | POA: Diagnosis not present

## 2017-08-02 DIAGNOSIS — H903 Sensorineural hearing loss, bilateral: Secondary | ICD-10-CM | POA: Diagnosis not present

## 2017-08-04 ENCOUNTER — Ambulatory Visit (INDEPENDENT_AMBULATORY_CARE_PROVIDER_SITE_OTHER): Payer: Medicare Other | Admitting: Family Medicine

## 2017-08-04 ENCOUNTER — Encounter: Payer: Self-pay | Admitting: Family Medicine

## 2017-08-04 VITALS — BP 146/88 | HR 67 | Temp 97.3°F | Ht 65.5 in | Wt 179.5 lb

## 2017-08-04 DIAGNOSIS — N183 Chronic kidney disease, stage 3 unspecified: Secondary | ICD-10-CM

## 2017-08-04 DIAGNOSIS — I10 Essential (primary) hypertension: Secondary | ICD-10-CM

## 2017-08-04 DIAGNOSIS — R531 Weakness: Secondary | ICD-10-CM | POA: Diagnosis not present

## 2017-08-04 DIAGNOSIS — R5382 Chronic fatigue, unspecified: Secondary | ICD-10-CM | POA: Diagnosis not present

## 2017-08-04 DIAGNOSIS — R5383 Other fatigue: Secondary | ICD-10-CM | POA: Insufficient documentation

## 2017-08-04 LAB — CBC WITH DIFFERENTIAL/PLATELET
Basophils Absolute: 0 10*3/uL (ref 0.0–0.1)
Basophils Relative: 0.2 % (ref 0.0–3.0)
Eosinophils Absolute: 0.1 10*3/uL (ref 0.0–0.7)
Eosinophils Relative: 0.9 % (ref 0.0–5.0)
HCT: 40.9 % (ref 39.0–52.0)
Hemoglobin: 14 g/dL (ref 13.0–17.0)
Lymphocytes Relative: 24.6 % (ref 12.0–46.0)
Lymphs Abs: 1.8 10*3/uL (ref 0.7–4.0)
MCHC: 34.3 g/dL (ref 30.0–36.0)
MCV: 90.9 fl (ref 78.0–100.0)
Monocytes Absolute: 0.5 10*3/uL (ref 0.1–1.0)
Monocytes Relative: 6.7 % (ref 3.0–12.0)
Neutro Abs: 5 10*3/uL (ref 1.4–7.7)
Neutrophils Relative %: 67.6 % (ref 43.0–77.0)
Platelets: 177 10*3/uL (ref 150.0–400.0)
RBC: 4.5 Mil/uL (ref 4.22–5.81)
RDW: 14.6 % (ref 11.5–15.5)
WBC: 7.4 10*3/uL (ref 4.0–10.5)

## 2017-08-04 LAB — COMPREHENSIVE METABOLIC PANEL
ALT: 14 U/L (ref 0–53)
AST: 21 U/L (ref 0–37)
Albumin: 4.1 g/dL (ref 3.5–5.2)
Alkaline Phosphatase: 73 U/L (ref 39–117)
BUN: 28 mg/dL — ABNORMAL HIGH (ref 6–23)
CO2: 31 mEq/L (ref 19–32)
Calcium: 10 mg/dL (ref 8.4–10.5)
Chloride: 104 mEq/L (ref 96–112)
Creatinine, Ser: 1.64 mg/dL — ABNORMAL HIGH (ref 0.40–1.50)
GFR: 41.98 mL/min — ABNORMAL LOW (ref >60.00)
Glucose, Bld: 98 mg/dL (ref 70–99)
Potassium: 4.9 mEq/L (ref 3.5–5.1)
Sodium: 141 mEq/L (ref 135–145)
Total Bilirubin: 0.6 mg/dL (ref 0.2–1.2)
Total Protein: 6.7 g/dL (ref 6.0–8.3)

## 2017-08-04 LAB — POC URINALSYSI DIPSTICK (AUTOMATED)
Bilirubin, UA: NEGATIVE
Blood, UA: NEGATIVE
Glucose, UA: NEGATIVE
Ketones, UA: NEGATIVE
Leukocytes, UA: NEGATIVE
Nitrite, UA: NEGATIVE
Protein, UA: NEGATIVE
Spec Grav, UA: 1.015 (ref 1.010–1.025)
Urobilinogen, UA: 0.2 E.U./dL
pH, UA: 7 (ref 5.0–8.0)

## 2017-08-04 LAB — VITAMIN B12: Vitamin B-12: 715 pg/mL (ref 211–911)

## 2017-08-04 LAB — TSH: TSH: 3.46 u[IU]/mL (ref 0.35–4.50)

## 2017-08-04 NOTE — Progress Notes (Signed)
Subjective:    Patient ID: Stephen Garrett, male    DOB: [birth year], 84 y.o.   MRN: [id number]  HPI  84 yo pt of Dr Silvio Pate here with c/o of fatigue and generalized weakness   [facility] - became tired /difficult to concentrate  Describes it as "overwhelming" - and he could not play bridge well but kept playing Lasted from 3-9 pm then went to bed   Today - he felt better (fine)  Played 2 hours of tennis  Then felt much more weak and light headed  It was not that hot  Generally does not drink that much fluid (despite renal dz)   No cold symptoms  No fever  No tick bites/insect bites No rash   No stress or mood change   No urinary symptoms  BPH - gets up every 3 hours to urinate at night  No dysuria or change in urine   Wt Readings from Last 3 Encounters:  08/04/17 179 lb 8 oz (81.4 kg)  06/06/17 180 lb 4 oz (81.8 kg)  02/15/17 186 lb 9.6 oz (84.6 kg)  intentional weight loss- eats less in general  29.42 kg/m    Hx of chronic renal dz  Lab Results  Component Value Date   CREATININE 1.62 (H) 06/06/2017   BUN 27 (H) 06/06/2017   NA 140 06/06/2017   K 3.2 (L) 06/06/2017   CL 99 06/06/2017   CO2 34 (H) 06/06/2017   Also HTN BP Readings from Last 3 Encounters:  08/04/17 (!) 146/88  06/06/17 132/82  02/16/17 (!) 147/83   He takes vit B6 One glass of wine daily  Has a generally poor memory (he thinks) for years   Patient Active Problem List   Diagnosis Date Noted  . Fatigue 08/04/2017  . General weakness 08/04/2017  . Retinal vein occlusion, central 02/19/2014  . Chronic venous insufficiency 02/19/2014  . Episodic mood disorder ([place]) 12/20/2013  . Advanced directives, counseling/discussion 12/20/2013  . Routine general medical examination at a health care facility 11/02/2012  . ACTINIC KERATOSIS 11/18/2009  . Chronic kidney disease, stage III (moderate) ([place]) 01/26/2009  . Restless legs syndrome 10/03/2008  .  OSTEOARTHRITIS 12/12/2006  . SPINAL STENOSIS, LUMBAR 06/24/2006  . Essential hypertension, benign 06/21/2006  . GERD 06/21/2006  . DIVERTICULOSIS, COLON 06/21/2006  . BPH with obstruction/lower urinary tract symptoms 06/21/2006   Past Medical History:  Diagnosis Date  . Actinic keratosis   . BPH (benign prostatic hypertrophy)   . Diseases of lips   . Diverticulosis of colon 06/2003  . ED (erectile dysfunction)   . GERD (gastroesophageal reflux disease)   . Heart murmur    as child  . HLD (hyperlipidemia)   . HOH (hard of hearing)   . HTN (hypertension)   . Osteoarthritis of knee    severe, bilat  . Renal insufficiency   . Restless legs syndrome (RLS)   . Sleep disturbance, unspecified   . Spinal stenosis, lumbar region, without neurogenic claudication    Past Surgical History:  Procedure Laterality Date  . CATARACT EXTRACTION, BILATERAL  2011   Dr. Thomasene Ripple  . COCHLEAR IMPLANT Right 02/15/2017   Procedure: RIGHT COCHLEAR IMPLANT;  Surgeon: Vicie Mutters, MD;  Location: [place];  Service: ENT;  Laterality: Right;  . FINGER SURGERY     Right 4th finger  . [facility]  10/2009   Dr. Bary Castilla  . LUMBAR SPINE SURGERY  06/2005  stenosis repair  . TONSILLECTOMY  childhood  . TOTAL KNEE ARTHROPLASTY  06/2008   bilat (Dr. Marry Guan)   Social History   Tobacco Use  . Smoking status: Former Smoker    Last attempt to quit: 02/28/1970    Years since quitting: 47.4  . Smokeless tobacco: Never Used  Substance Use Topics  . Alcohol use: Yes    Alcohol/week: 0.0 oz    Comment: 1-2 glasses wine/daily  . Drug use: No   Family History  Problem Relation Age of Onset  . Other Father        "clogged arteries"  . Hypertension Mother    Allergies  Allergen Reactions  . Atorvastatin     REACTION: raises blood pressure  . Pravastatin Sodium     REACTION: raises blood pressure  . Statins     REACTION: raises bp   Current Outpatient Medications on File Prior to Visit    Medication Sig Dispense Refill  . amLODipine (NORVASC) 5 MG tablet Take 1 tablet (5 mg total) by mouth daily. (Patient taking differently: Take 2.5 mg by mouth daily. ) 90 tablet 3  . buPROPion (WELLBUTRIN XL) 150 MG 24 hr tablet TAKE ONE TABLET BY MOUTH EVERY DAY 90 tablet 3  . clonazePAM (KLONOPIN) 0.5 MG tablet TAKE 1 OR 2 TABLETS BY MOUTH AT BEDTIME 180 tablet 0  . finasteride (PROSCAR) 5 MG tablet TAKE ONE TABLET BY MOUTH EVERY DAY 90 tablet 3  . furosemide (LASIX) 40 MG tablet Take 1 tablet (40 mg total) by mouth daily as needed. 30 tablet 1  . Glucosamine-Chondroitin (GLUCOSAMINE CHONDR COMPLEX PO) Take by mouth daily.    Marland Kitchen losartan-hydrochlorothiazide (HYZAAR) 100-25 MG tablet TAKE ONE TABLET BY MOUTH EVERY DAY 90 tablet 3  . Multiple Vitamin (MULTIVITAMIN) capsule Take 1 capsule by mouth daily.      Marland Kitchen omeprazole (PRILOSEC) 40 MG capsule TAKE ONE CAPSULE BY MOUTH EVERY DAY 90 capsule 3  . pramipexole (MIRAPEX) 1 MG tablet TAKE 1 OR 2 TABLETS AT BEDTIME AS NEEDED 180 tablet 3  . pyridoxine (B-6) 100 MG tablet Take 100 mg by mouth daily.    . tamsulosin (FLOMAX) 0.4 MG CAPS capsule Take 1 capsule (0.4 mg total) by mouth daily. 90 capsule 3  . [DISCONTINUED] citalopram (CELEXA) 10 MG tablet Take 1 tablet (10 mg total) by mouth daily. 30 tablet 5   No current facility-administered medications on file prior to visit.      Review of Systems  Constitutional: Positive for fatigue. Negative for activity change, appetite change, chills, diaphoresis, fever and unexpected weight change.  HENT: Negative for congestion, rhinorrhea, sinus pain, sore throat, trouble swallowing and voice change.   Eyes: Negative for pain, redness, itching and visual disturbance.  Respiratory: Negative for cough, chest tightness, shortness of breath and wheezing.   Cardiovascular: Negative for chest pain and palpitations.  Gastrointestinal: Negative for abdominal pain, blood in stool, constipation, diarrhea and  nausea.  Endocrine: Negative for cold intolerance, heat intolerance, polydipsia and polyuria.  Genitourinary: Negative for decreased urine volume, difficulty urinating, dysuria, frequency and urgency.  Musculoskeletal: Positive for arthralgias. Negative for joint swelling and myalgias.  Skin: Negative for pallor and rash.  Neurological: Negative for dizziness, tremors, seizures, syncope, facial asymmetry, speech difficulty, weakness, light-headedness, numbness and headaches.  Hematological: Negative for adenopathy. Does not bruise/bleed easily.  Psychiatric/Behavioral: Positive for decreased concentration. Negative for behavioral problems, confusion, dysphoric mood and hallucinations. The patient is not nervous/anxious.  Objective:   Physical Exam  Constitutional: He appears well-developed and well-nourished. No distress.  Well appearing elderly male   HENT:  Head: Normocephalic and atraumatic.  Mouth/Throat: Oropharynx is clear and moist.  Cochlear implant on R  Hard of hearing  Eyes: Pupils are equal, round, and reactive to light. Conjunctivae and EOM are normal.  Neck: Normal range of motion. Neck supple. No JVD present. Carotid bruit is not present. No thyromegaly present.  Cardiovascular: Normal rate, regular rhythm, normal heart sounds and intact distal pulses. Exam reveals no gallop.  Pulmonary/Chest: Effort normal and breath sounds normal. No respiratory distress. He has no wheezes. He has no rales.  No crackles  Abdominal: Soft. Bowel sounds are normal. He exhibits no distension, no abdominal bruit and no mass. There is no tenderness.  Musculoskeletal: He exhibits no edema.  Lymphadenopathy:    He has no cervical adenopathy.  Neurological: He is alert. He has normal reflexes. He displays no atrophy, no tremor and normal reflexes. No cranial nerve deficit or sensory deficit. He exhibits normal muscle tone. He displays no seizure activity. Coordination and gait normal.  No  cerebellar signs  Gait is slow and steady  (states he feels fine today)   Skin: Skin is warm and dry. No rash noted.  Psychiatric: He has a normal mood and affect. His behavior is normal. Thought content normal.  pleasant          Assessment & Plan:   Problem List Items Addressed This Visit      Cardiovascular and Mediastinum   Essential hypertension, benign    bp is mildly elevated today  BP: (!) 146/88    F/u with pcp       Relevant Orders   CBC with Differential/Platelet (Completed)   Comprehensive metabolic panel (Completed)   TSH (Completed)     Genitourinary   Chronic kidney disease, stage III (moderate) (HCC)    Lab today in context of episodic weakness/fatigue Disc imp of good fluid intake  Poss dehydration after playing tennis Enc 64 oz of fluid daily to start over the weekend  Lab today UA clear       Relevant Orders   Comprehensive metabolic panel (Completed)   POCT Urinalysis Dipstick (Automated) (Completed)     Other   Fatigue - Primary    After playing tennis  Also episode playing cards with difficult concentration  Reassuring exam today  No new medications or changes in tx for chronic illnesses  Disc s/s of cva to watch for  Lab today  ua clear Enc fluids to avoid dehydration       Relevant Orders   CBC with Differential/Platelet (Completed)   Comprehensive metabolic panel (Completed)   TSH (Completed)   Vitamin B12 (Completed)   POCT Urinalysis Dipstick (Automated) (Completed)   General weakness    With fatigue-see above      Relevant Orders   Vitamin B12 (Completed)   POCT Urinalysis Dipstick (Automated) (Completed)

## 2017-08-04 NOTE — Patient Instructions (Signed)
Take care of yourself   You may be dehydrated-please increase fluids to 64 oz per day (mostly water-perhaps less soda)  Also get some rest over the weekend   Labs today   Also leave a urine specimen

## 2017-08-06 NOTE — Assessment & Plan Note (Signed)
With fatigue-see above

## 2017-08-06 NOTE — Assessment & Plan Note (Signed)
bp is mildly elevated today  BP: (!) 146/88    F/u with pcp

## 2017-08-06 NOTE — Assessment & Plan Note (Signed)
Lab today in context of episodic weakness/fatigue Disc imp of good fluid intake  Poss dehydration after playing tennis Enc 64 oz of fluid daily to start over the weekend  Lab today UA clear

## 2017-08-06 NOTE — Assessment & Plan Note (Signed)
After playing tennis  Also episode playing cards with difficult concentration  Reassuring exam today  No new medications or changes in tx for chronic illnesses  Disc s/s of cva to watch for  Lab today  ua clear Enc fluids to avoid dehydration

## 2017-08-14 ENCOUNTER — Ambulatory Visit: Payer: Self-pay | Admitting: *Deleted

## 2017-08-14 NOTE — Telephone Encounter (Signed)
   Reason for Disposition . Caller has medication question only, adult not sick, and triager answers question  Answer Assessment - Initial Assessment Questions 1. REASON FOR CALL or QUESTION: "What is your reason for calling today?" or "How can I best help you?" or "What question do you have that I can help answer?"     Patient wants to know what to use on his itching dry skin- legs are dry and itchy 2. CALLER: Document the source of call. (e.g., laboratory, patient).     patient  Protocols used: MEDICATION QUESTION CALL-A-AH, PCP CALL - NO TRIAGE-A-AH Advised: hydrocortisone cream, lotions for dry skin, Aveeno, if systemic itching check with pharmacist- possibly try Benadryl. Patient will do. Patient to call back if OTC lotion/treatmant does not help.

## 2017-08-18 DIAGNOSIS — H469 Unspecified optic neuritis: Secondary | ICD-10-CM | POA: Diagnosis not present

## 2017-08-18 DIAGNOSIS — H538 Other visual disturbances: Secondary | ICD-10-CM | POA: Diagnosis not present

## 2017-08-21 DIAGNOSIS — H903 Sensorineural hearing loss, bilateral: Secondary | ICD-10-CM | POA: Diagnosis not present

## 2017-08-23 ENCOUNTER — Other Ambulatory Visit: Payer: Self-pay | Admitting: Internal Medicine

## 2017-08-28 DIAGNOSIS — H903 Sensorineural hearing loss, bilateral: Secondary | ICD-10-CM | POA: Diagnosis not present

## 2017-08-30 DIAGNOSIS — D225 Melanocytic nevi of trunk: Secondary | ICD-10-CM | POA: Diagnosis not present

## 2017-08-30 DIAGNOSIS — X32XXXA Exposure to sunlight, initial encounter: Secondary | ICD-10-CM | POA: Diagnosis not present

## 2017-08-30 DIAGNOSIS — Z85828 Personal history of other malignant neoplasm of skin: Secondary | ICD-10-CM | POA: Diagnosis not present

## 2017-08-30 DIAGNOSIS — L820 Inflamed seborrheic keratosis: Secondary | ICD-10-CM | POA: Diagnosis not present

## 2017-08-30 DIAGNOSIS — L298 Other pruritus: Secondary | ICD-10-CM | POA: Diagnosis not present

## 2017-08-30 DIAGNOSIS — L570 Actinic keratosis: Secondary | ICD-10-CM | POA: Diagnosis not present

## 2017-08-30 DIAGNOSIS — Z08 Encounter for follow-up examination after completed treatment for malignant neoplasm: Secondary | ICD-10-CM | POA: Diagnosis not present

## 2017-08-30 DIAGNOSIS — L538 Other specified erythematous conditions: Secondary | ICD-10-CM | POA: Diagnosis not present

## 2017-08-30 DIAGNOSIS — Z85820 Personal history of malignant melanoma of skin: Secondary | ICD-10-CM | POA: Diagnosis not present

## 2017-09-12 DIAGNOSIS — H34832 Tributary (branch) retinal vein occlusion, left eye, with macular edema: Secondary | ICD-10-CM | POA: Diagnosis not present

## 2017-09-14 ENCOUNTER — Emergency Department: Payer: Medicare Other

## 2017-09-14 ENCOUNTER — Observation Stay: Payer: Medicare Other

## 2017-09-14 ENCOUNTER — Encounter: Payer: Self-pay | Admitting: *Deleted

## 2017-09-14 ENCOUNTER — Other Ambulatory Visit: Payer: Self-pay

## 2017-09-14 ENCOUNTER — Observation Stay
Admission: EM | Admit: 2017-09-14 | Discharge: 2017-09-15 | Disposition: A | Discharge status: Home / Self Care | Payer: Medicare Other | Attending: Internal Medicine | Admitting: Internal Medicine

## 2017-09-14 DIAGNOSIS — G2581 Restless legs syndrome: Secondary | ICD-10-CM | POA: Insufficient documentation

## 2017-09-14 DIAGNOSIS — Z7982 Long term (current) use of aspirin: Secondary | ICD-10-CM | POA: Diagnosis not present

## 2017-09-14 DIAGNOSIS — H532 Diplopia: Secondary | ICD-10-CM | POA: Insufficient documentation

## 2017-09-14 DIAGNOSIS — M170 Bilateral primary osteoarthritis of knee: Secondary | ICD-10-CM | POA: Insufficient documentation

## 2017-09-14 DIAGNOSIS — I739 Peripheral vascular disease, unspecified: Secondary | ICD-10-CM | POA: Insufficient documentation

## 2017-09-14 DIAGNOSIS — I672 Cerebral atherosclerosis: Secondary | ICD-10-CM | POA: Diagnosis not present

## 2017-09-14 DIAGNOSIS — N138 Other obstructive and reflux uropathy: Secondary | ICD-10-CM | POA: Insufficient documentation

## 2017-09-14 DIAGNOSIS — L570 Actinic keratosis: Secondary | ICD-10-CM | POA: Insufficient documentation

## 2017-09-14 DIAGNOSIS — F419 Anxiety disorder, unspecified: Secondary | ICD-10-CM | POA: Insufficient documentation

## 2017-09-14 DIAGNOSIS — G459 Transient cerebral ischemic attack, unspecified: Secondary | ICD-10-CM | POA: Diagnosis not present

## 2017-09-14 DIAGNOSIS — E785 Hyperlipidemia, unspecified: Secondary | ICD-10-CM | POA: Diagnosis not present

## 2017-09-14 DIAGNOSIS — M8580 Other specified disorders of bone density and structure, unspecified site: Secondary | ICD-10-CM | POA: Diagnosis not present

## 2017-09-14 DIAGNOSIS — I700 Atherosclerosis of aorta: Secondary | ICD-10-CM | POA: Diagnosis not present

## 2017-09-14 DIAGNOSIS — G479 Sleep disorder, unspecified: Secondary | ICD-10-CM | POA: Diagnosis not present

## 2017-09-14 DIAGNOSIS — K219 Gastro-esophageal reflux disease without esophagitis: Secondary | ICD-10-CM | POA: Insufficient documentation

## 2017-09-14 DIAGNOSIS — I872 Venous insufficiency (chronic) (peripheral): Secondary | ICD-10-CM | POA: Insufficient documentation

## 2017-09-14 DIAGNOSIS — M48061 Spinal stenosis, lumbar region without neurogenic claudication: Secondary | ICD-10-CM | POA: Insufficient documentation

## 2017-09-14 DIAGNOSIS — N401 Enlarged prostate with lower urinary tract symptoms: Secondary | ICD-10-CM | POA: Diagnosis not present

## 2017-09-14 DIAGNOSIS — K5730 Diverticulosis of large intestine without perforation or abscess without bleeding: Secondary | ICD-10-CM | POA: Diagnosis not present

## 2017-09-14 DIAGNOSIS — R42 Dizziness and giddiness: Secondary | ICD-10-CM | POA: Diagnosis not present

## 2017-09-14 DIAGNOSIS — H905 Unspecified sensorineural hearing loss: Secondary | ICD-10-CM | POA: Diagnosis not present

## 2017-09-14 DIAGNOSIS — I1310 Hypertensive heart and chronic kidney disease without heart failure, with stage 1 through stage 4 chronic kidney disease, or unspecified chronic kidney disease: Secondary | ICD-10-CM | POA: Insufficient documentation

## 2017-09-14 DIAGNOSIS — Z8249 Family history of ischemic heart disease and other diseases of the circulatory system: Secondary | ICD-10-CM | POA: Insufficient documentation

## 2017-09-14 DIAGNOSIS — Z9621 Cochlear implant status: Secondary | ICD-10-CM | POA: Insufficient documentation

## 2017-09-14 DIAGNOSIS — F39 Unspecified mood [affective] disorder: Secondary | ICD-10-CM | POA: Diagnosis not present

## 2017-09-14 DIAGNOSIS — G709 Myoneural disorder, unspecified: Secondary | ICD-10-CM | POA: Insufficient documentation

## 2017-09-14 DIAGNOSIS — Z888 Allergy status to other drugs, medicaments and biological substances status: Secondary | ICD-10-CM | POA: Insufficient documentation

## 2017-09-14 DIAGNOSIS — Z9842 Cataract extraction status, left eye: Secondary | ICD-10-CM | POA: Insufficient documentation

## 2017-09-14 DIAGNOSIS — Z79899 Other long term (current) drug therapy: Secondary | ICD-10-CM | POA: Insufficient documentation

## 2017-09-14 DIAGNOSIS — I6523 Occlusion and stenosis of bilateral carotid arteries: Secondary | ICD-10-CM | POA: Diagnosis not present

## 2017-09-14 DIAGNOSIS — Z9841 Cataract extraction status, right eye: Secondary | ICD-10-CM | POA: Insufficient documentation

## 2017-09-14 DIAGNOSIS — N400 Enlarged prostate without lower urinary tract symptoms: Secondary | ICD-10-CM | POA: Diagnosis not present

## 2017-09-14 DIAGNOSIS — I10 Essential (primary) hypertension: Secondary | ICD-10-CM | POA: Diagnosis not present

## 2017-09-14 DIAGNOSIS — Z96653 Presence of artificial knee joint, bilateral: Secondary | ICD-10-CM | POA: Insufficient documentation

## 2017-09-14 DIAGNOSIS — Z87891 Personal history of nicotine dependence: Secondary | ICD-10-CM | POA: Insufficient documentation

## 2017-09-14 DIAGNOSIS — N183 Chronic kidney disease, stage 3 (moderate): Secondary | ICD-10-CM | POA: Diagnosis not present

## 2017-09-14 LAB — COMPREHENSIVE METABOLIC PANEL
ALT: 19 U/L (ref 0–44)
AST: 29 U/L (ref 15–41)
Albumin: 3.9 g/dL (ref 3.5–5.0)
Alkaline Phosphatase: 80 U/L (ref 38–126)
Anion gap: 7 (ref 5–15)
BUN: 29 mg/dL — ABNORMAL HIGH (ref 8–23)
CO2: 26 mmol/L (ref 22–32)
Calcium: 9.3 mg/dL (ref 8.9–10.3)
Chloride: 106 mmol/L (ref 98–111)
Creatinine, Ser: 1.44 mg/dL — ABNORMAL HIGH (ref 0.61–1.24)
GFR calc Af Amer: 47 mL/min — ABNORMAL LOW (ref >60)
GFR calc non Af Amer: 41 mL/min — ABNORMAL LOW (ref >60)
Glucose, Bld: 108 mg/dL — ABNORMAL HIGH (ref 70–99)
Potassium: 3.5 mmol/L (ref 3.5–5.1)
Sodium: 139 mmol/L (ref 135–145)
Total Bilirubin: 0.8 mg/dL (ref 0.3–1.2)
Total Protein: 6.6 g/dL (ref 6.5–8.1)

## 2017-09-14 LAB — TROPONIN I: Troponin I: 0.04 ng/mL (ref <0.03)

## 2017-09-14 LAB — CBC
HCT: 39.2 % — ABNORMAL LOW (ref 40.0–52.0)
Hemoglobin: 13.6 g/dL (ref 13.0–18.0)
MCH: 31.4 pg (ref 26.0–34.0)
MCHC: 34.8 g/dL (ref 32.0–36.0)
MCV: 90.4 fL (ref 80.0–100.0)
Platelets: 176 10*3/uL (ref 150–440)
RBC: 4.34 MIL/uL — ABNORMAL LOW (ref 4.40–5.90)
RDW: 14.3 % (ref 11.5–14.5)
WBC: 6.4 10*3/uL (ref 3.8–10.6)

## 2017-09-14 LAB — DIFFERENTIAL
Basophils Absolute: 0 10*3/uL (ref 0–0.1)
Basophils Relative: 0 %
Eosinophils Absolute: 0.1 10*3/uL (ref 0–0.7)
Eosinophils Relative: 1 %
Lymphocytes Relative: 25 %
Lymphs Abs: 1.6 10*3/uL (ref 1.0–3.6)
Monocytes Absolute: 0.4 10*3/uL (ref 0.2–1.0)
Monocytes Relative: 6 %
Neutro Abs: 4.4 10*3/uL (ref 1.4–6.5)
Neutrophils Relative %: 68 %

## 2017-09-14 LAB — PROTIME-INR
INR: 0.88
Prothrombin Time: 11.9 seconds (ref 11.4–15.2)

## 2017-09-14 LAB — C-REACTIVE PROTEIN: CRP: <0.8 mg/dL (ref <1.0)

## 2017-09-14 LAB — SEDIMENTATION RATE: Sed Rate: 13 mm/hr (ref 0–20)

## 2017-09-14 LAB — APTT: aPTT: 44 seconds — ABNORMAL HIGH (ref 24–36)

## 2017-09-14 MED ORDER — CLONAZEPAM 0.5 MG PO TABS
0.5000 mg | ORAL_TABLET | Freq: Every day | ORAL | Status: DC
  Administered 2017-09-14: 22:00:00 0.5 mg via ORAL
  Filled 2017-09-14: qty 1

## 2017-09-14 MED ORDER — HEPARIN SODIUM (PORCINE) 5000 UNIT/ML IJ SOLN
5000.0000 [IU] | Freq: Three times a day (TID) | INTRAMUSCULAR | Status: DC
  Administered 2017-09-14 – 2017-09-15 (×2): 5000 [IU] via SUBCUTANEOUS
  Filled 2017-09-14 (×3): qty 1

## 2017-09-14 MED ORDER — LOSARTAN POTASSIUM-HCTZ 100-25 MG PO TABS: 1.0000 | ORAL_TABLET | Freq: Every day | ORAL | Status: DC

## 2017-09-14 MED ORDER — ONDANSETRON HCL 4 MG/2ML IJ SOLN: 4.0000 mg | Freq: Four times a day (QID) | INTRAMUSCULAR | Status: DC | PRN

## 2017-09-14 MED ORDER — FINASTERIDE 5 MG PO TABS
5.0000 mg | ORAL_TABLET | Freq: Every day | ORAL | Status: DC
  Administered 2017-09-15: 5 mg via ORAL
  Filled 2017-09-14: qty 1

## 2017-09-14 MED ORDER — ONDANSETRON HCL 4 MG PO TABS: 4.0000 mg | ORAL_TABLET | Freq: Four times a day (QID) | ORAL | Status: DC | PRN

## 2017-09-14 MED ORDER — TAMSULOSIN HCL 0.4 MG PO CAPS
0.4000 mg | ORAL_CAPSULE | Freq: Every day | ORAL | Status: DC
  Administered 2017-09-15: 10:00:00 0.4 mg via ORAL
  Filled 2017-09-14: qty 1

## 2017-09-14 MED ORDER — ADULT MULTIVITAMIN W/MINERALS CH
1.0000 | ORAL_TABLET | Freq: Every day | ORAL | Status: DC
  Administered 2017-09-15: 10:00:00 1 via ORAL
  Filled 2017-09-14: qty 1

## 2017-09-14 MED ORDER — AMLODIPINE BESYLATE 5 MG PO TABS
5.0000 mg | ORAL_TABLET | Freq: Every day | ORAL | Status: DC
  Administered 2017-09-15: 5 mg via ORAL
  Filled 2017-09-14: qty 1

## 2017-09-14 MED ORDER — ACETAMINOPHEN 325 MG PO TABS: 650.0000 mg | ORAL_TABLET | Freq: Four times a day (QID) | ORAL | Status: DC | PRN

## 2017-09-14 MED ORDER — ASPIRIN 81 MG PO CHEW
324.0000 mg | CHEWABLE_TABLET | Freq: Once | ORAL | Status: AC
  Administered 2017-09-14: 324 mg via ORAL
  Filled 2017-09-14: qty 4

## 2017-09-14 MED ORDER — PRAMIPEXOLE DIHYDROCHLORIDE 1 MG PO TABS
1.0000 mg | ORAL_TABLET | Freq: Every evening | ORAL | Status: DC | PRN
  Administered 2017-09-14: 1 mg via ORAL
  Filled 2017-09-14: qty 1

## 2017-09-14 MED ORDER — HYDROCHLOROTHIAZIDE 25 MG PO TABS
25.0000 mg | ORAL_TABLET | Freq: Every day | ORAL | Status: DC
  Administered 2017-09-15: 10:00:00 25 mg via ORAL
  Filled 2017-09-14: qty 1

## 2017-09-14 MED ORDER — LOSARTAN POTASSIUM 50 MG PO TABS
100.0000 mg | ORAL_TABLET | Freq: Every day | ORAL | Status: DC
  Administered 2017-09-15: 10:00:00 100 mg via ORAL
  Filled 2017-09-14: qty 2

## 2017-09-14 MED ORDER — ASPIRIN EC 81 MG PO TBEC
81.0000 mg | DELAYED_RELEASE_TABLET | Freq: Every day | ORAL | Status: DC
  Administered 2017-09-15: 81 mg via ORAL
  Filled 2017-09-14: qty 1

## 2017-09-14 MED ORDER — VITAMIN B-6 50 MG PO TABS
100.0000 mg | ORAL_TABLET | Freq: Every day | ORAL | Status: DC
  Administered 2017-09-15: 100 mg via ORAL
  Filled 2017-09-14: qty 2

## 2017-09-14 MED ORDER — BUPROPION HCL ER (XL) 150 MG PO TB24
150.0000 mg | ORAL_TABLET | Freq: Every day | ORAL | Status: DC
  Administered 2017-09-15: 150 mg via ORAL
  Filled 2017-09-14: qty 1

## 2017-09-14 MED ORDER — IOPAMIDOL (ISOVUE-370) INJECTION 76%
60.0000 mL | Freq: Once | INTRAVENOUS | Status: AC | PRN
  Administered 2017-09-14: 60 mL via INTRAVENOUS

## 2017-09-14 MED ORDER — ACETAMINOPHEN 650 MG RE SUPP: 650.0000 mg | Freq: Four times a day (QID) | RECTAL | Status: DC | PRN

## 2017-09-14 MED ORDER — PANTOPRAZOLE SODIUM 40 MG PO TBEC
40.0000 mg | DELAYED_RELEASE_TABLET | Freq: Every day | ORAL | Status: DC
  Administered 2017-09-15: 40 mg via ORAL
  Filled 2017-09-14: qty 1

## 2017-09-14 NOTE — ED Provider Notes (Signed)
[facility] Emergency Department Provider Note       Time seen: ----------------------------------------- 12:13 PM on 09/14/2017 -----------------------------------------   I have reviewed the triage vital signs and the nursing notes.  HISTORY   Chief Complaint Diplopia    HPI Stephen Garrett is a 84 y.o. male with a history of GERD, hyperlipidemia, hypertension, renal insufficiency, restless leg syndrome, spinal stenosis who presents to the ED for diplopia.  She was actually being seen by his ophthalmologist today and was sent here for vertically oriented diplopia.  Patient describes sudden onset over the past 2 hours that is been intermittent.  Currently it is gone but it is coming and going.  He has no other focal neurologic deficits at this time.  Past Medical History:  Diagnosis Date  . Actinic keratosis   . BPH (benign prostatic hypertrophy)   . Diseases of lips   . Diverticulosis of colon 06/2003  . ED (erectile dysfunction)   . GERD (gastroesophageal reflux disease)   . Heart murmur    as child  . HLD (hyperlipidemia)   . HOH (hard of hearing)   . HTN (hypertension)   . Osteoarthritis of knee    severe, bilat  . Renal insufficiency   . Restless legs syndrome (RLS)   . Sleep disturbance, unspecified   . Spinal stenosis, lumbar region, without neurogenic claudication     Patient Active Problem List   Diagnosis Date Noted  . Fatigue 08/04/2017  . General weakness 08/04/2017  . Retinal vein occlusion, central 02/19/2014  . Chronic venous insufficiency 02/19/2014  . Episodic mood disorder ([place]) 12/20/2013  . Advanced directives, counseling/discussion 12/20/2013  . Routine general medical examination at a health care facility 11/02/2012  . ACTINIC KERATOSIS 11/18/2009  . Chronic kidney disease, stage III (moderate) ([place]) 01/26/2009  . Restless legs syndrome 10/03/2008  . OSTEOARTHRITIS 12/12/2006  . SPINAL STENOSIS, LUMBAR 06/24/2006  .  Essential hypertension, benign 06/21/2006  . GERD 06/21/2006  . DIVERTICULOSIS, COLON 06/21/2006  . BPH with obstruction/lower urinary tract symptoms 06/21/2006    Past Surgical History:  Procedure Laterality Date  . CATARACT EXTRACTION, BILATERAL  2011   Dr. Thomasene Ripple  . COCHLEAR IMPLANT Right 02/15/2017   Procedure: RIGHT COCHLEAR IMPLANT;  Surgeon: Vicie Mutters, MD;  Location: [facility];  Service: ENT;  Laterality: Right;  . FINGER SURGERY     Right 4th finger  . [facility]  10/2009   Dr. Bary Castilla  . LUMBAR SPINE SURGERY  06/2005   stenosis repair  . TONSILLECTOMY  childhood  . TOTAL KNEE ARTHROPLASTY  06/2008   bilat (Dr. Marry Guan)    Allergies Atorvastatin; Pravastatin sodium; and Statins  Social History Social History   Tobacco Use  . Smoking status: Former Smoker    Last attempt to quit: 02/28/1970    Years since quitting: 47.5  . Smokeless tobacco: Never Used  Substance Use Topics  . Alcohol use: Yes    Alcohol/week: 0.0 oz    Comment: 1-2 glasses wine/daily  . Drug use: No   Review of Systems Constitutional: Negative for fever. Eyes: Positive for vision changes Cardiovascular: Negative for chest pain. Respiratory: Negative for shortness of breath. Gastrointestinal: Negative for abdominal pain, vomiting and diarrhea. Musculoskeletal: Negative for back pain. Skin: Negative for rash. Neurological: Negative for headaches, focal weakness or numbness.  All systems negative/normal/unremarkable except as stated in the HPI  ____________________________________________   PHYSICAL EXAM:  VITAL SIGNS: ED Triage Vitals  Enc Vitals Group  BP 09/14/17 1209 (!) 153/72     Pulse Rate 09/14/17 1209 72     Resp 09/14/17 1209 15     Temp 09/14/17 1209 97.7 F (36.5 C)     Temp Source 09/14/17 1209 Oral     SpO2 09/14/17 1209 99 %     Weight 09/14/17 1210 178 lb (80.7 kg)     Height 09/14/17 1210 5\' 6"  (1.676 m)     Head Circumference --      Peak Flow --       Pain Score 09/14/17 1210 0     Pain Loc --      Pain Edu? --      Excl. in [place]? --    Constitutional: Alert and oriented. Well appearing and in no distress. Eyes: Conjunctivae are normal.  Pupil irregularity bilaterally from cataract surgery ENT   Head: Normocephalic and atraumatic.   Nose: No congestion/rhinnorhea.   Mouth/Throat: Mucous membranes are moist.   Neck: No stridor. Cardiovascular: Normal rate, regular rhythm. No murmurs, rubs, or gallops. Respiratory: Normal respiratory effort without tachypnea nor retractions. Breath sounds are clear and equal bilaterally. No wheezes/rales/rhonchi. Gastrointestinal: Soft and nontender. Normal bowel sounds Musculoskeletal: Nontender with normal range of motion in extremities. No lower extremity tenderness nor edema. Neurologic:  Normal speech and language. No gross focal neurologic deficits are appreciated.  Skin:  Skin is warm, dry and intact. No rash noted. Psychiatric: Mood and affect are normal. Speech and behavior are normal.  ____________________________________________  EKG: Interpreted by me.  Sinus rhythm rate 68 bpm, prolonged PR interval, possible septal infarct age-indeterminate, normal QT, normal axis  ____________________________________________  ED COURSE:  As part of my medical decision making, I reviewed the following data within the [place] History obtained from family if available, nursing notes, old chart and ekg, as well as notes from prior ED visits. Patient presented for diplopia, we will assess with labs and imaging as indicated at this time.   Procedures ____________________________________________   LABS (pertinent positives/negatives)  Labs Reviewed  APTT - Abnormal; Notable for the following components:      Result Value   aPTT 44 (*)    All other components within normal limits  CBC - Abnormal; Notable for the following components:   RBC 4.34 (*)    HCT 39.2 (*)     All other components within normal limits  COMPREHENSIVE METABOLIC PANEL - Abnormal; Notable for the following components:   Glucose, Bld 108 (*)    BUN 29 (*)    Creatinine, Ser 1.44 (*)    GFR calc non Af Amer 41 (*)    GFR calc Af Amer 47 (*)    All other components within normal limits  TROPONIN I - Abnormal; Notable for the following components:   Troponin I 0.04 (*)    All other components within normal limits  PROTIME-INR  DIFFERENTIAL  SEDIMENTATION RATE  C-REACTIVE PROTEIN  ACETYLCHOLINE RECEPTOR, BINDING  STRIATED MUSCLE ANTIBODY    RADIOLOGY Images were viewed by me  CT head IMPRESSION: Susceptibility artifact on the right due to cochlear implant. There is moderate diffuse atrophy. No mass or hemorrhage evident. There is patchy small vessel disease in the centra semiovale bilaterally. No acute infarct evident.  Foci of arterial vascular calcification noted.  Mucosal thickening in several ethmoid air cells. Extensive opacification of mastoids on the right with postoperative change from cochlear implant placement on the right. ____________________________________________  DIFFERENTIAL DIAGNOSIS   CVA, TIA  FINAL ASSESSMENT AND PLAN  Diplopia, TIA   Plan: The patient had presented for diplopia. Patient's labs other than a mildly elevated troponin were grossly unremarkable. Patient's imaging reveal any acute process.  He cannot have an MRI due to having a cochlear implant.  He will need a full stroke work-up.  I have given him an aspirin and discussed with the hospitalist for admission.   Laurence Aly, MD   Note: This note was generated in part or whole with voice recognition software. Voice recognition is usually quite accurate but there are transcription errors that can and very often do occur. I apologize for any typographical errors that were not detected and corrected.     Earleen Newport, MD 09/14/17 [phone]

## 2017-09-14 NOTE — ED Notes (Addendum)
Date and time results received: 09/14/17 1313   Test: Troponin  Critical Value: 0.04 ng/mL  Name of Provider Notified: Dr. Jimmye Norman

## 2017-09-14 NOTE — ED Notes (Signed)
Urine sent to lab just to have  Lm edt

## 2017-09-14 NOTE — H&P (Signed)
[facility] [place] NAME: Stephen Garrett    MR#:  [id number]  DATE OF BIRTH:  [birth year]  DATE OF ADMISSION:  09/14/2017  PRIMARY CARE PHYSICIAN: [name], MD   REQUESTING/REFERRING PHYSICIAN: Dr. Lenise Arena  CHIEF COMPLAINT:   Chief Complaint  Patient presents with  . Diplopia    HISTORY OF PRESENT ILLNESS:  Stephen Garrett  is a 84 y.o. male with a known history of essential hypertension, hyperlipidemia, sensorineural hearing loss status post cochlear implant, history of restless leg syndrome, spinal stenosis, BPH who presents to the hospital due to sudden onset of double vision.  Patient says this morning he suddenly developed acute double vision and went to see his ophthalmologist who looked at his eye and then referred him to the ER for further evaluation.  Patient has never had a stroke before, he denies any headache, numbness, tingling, slurred speech, weakness in any of his extremities.  He presents to the hospital with just double vision which is intermittent in nature.  Hospitalist services were contacted for admission and stroke work-up.  PAST MEDICAL HISTORY:   Past Medical History:  Diagnosis Date  . Actinic keratosis   . BPH (benign prostatic hypertrophy)   . Diseases of lips   . Diverticulosis of colon 06/2003  . ED (erectile dysfunction)   . GERD (gastroesophageal reflux disease)   . Heart murmur    as child  . HLD (hyperlipidemia)   . HOH (hard of hearing)   . HTN (hypertension)   . Osteoarthritis of knee    severe, bilat  . Renal insufficiency   . Restless legs syndrome (RLS)   . Sleep disturbance, unspecified   . Spinal stenosis, lumbar region, without neurogenic claudication     PAST SURGICAL HISTORY:   Past Surgical History:  Procedure Laterality Date  . CATARACT EXTRACTION, BILATERAL  2011   Dr. Thomasene Ripple  . COCHLEAR IMPLANT Right 02/15/2017   Procedure: RIGHT COCHLEAR IMPLANT;  Surgeon:  Vicie Mutters, MD;  Location: [facility];  Service: ENT;  Laterality: Right;  . FINGER SURGERY     Right 4th finger  . [facility]  10/2009   Dr. Bary Castilla  . LUMBAR SPINE SURGERY  06/2005   stenosis repair  . TONSILLECTOMY  childhood  . TOTAL KNEE ARTHROPLASTY  06/2008   bilat (Dr. Marry Guan)    SOCIAL HISTORY:   Social History   Tobacco Use  . Smoking status: Former Smoker    Packs/day: 0.75    Years: 30.00    Pack years: 22.50    Last attempt to quit: 02/28/1970    Years since quitting: 47.5  . Smokeless tobacco: Never Used  Substance Use Topics  . Alcohol use: Yes    Alcohol/week: 0.0 oz    Comment: 1-2 glasses wine/daily    FAMILY HISTORY:   Family History  Problem Relation Age of Onset  . Other Father        "clogged arteries"  . Hypertension Mother     DRUG ALLERGIES:   Allergies  Allergen Reactions  . Atorvastatin     REACTION: raises blood pressure  . Pravastatin Sodium     REACTION: raises blood pressure  . Statins     REACTION: raises bp    REVIEW OF SYSTEMS:   Review of Systems  Constitutional: Negative for chills and fever.  HENT: Negative for congestion and tinnitus.   Eyes: Positive for double vision. Negative for blurred vision.  Respiratory: Negative for cough, shortness of breath and wheezing.   Cardiovascular: Negative for chest pain, orthopnea and PND.  Gastrointestinal: Negative for abdominal pain, diarrhea, nausea and vomiting.  Genitourinary: Negative for dysuria and hematuria.  Neurological: Negative for dizziness, sensory change and focal weakness.  All other systems reviewed and are negative.   MEDICATIONS AT HOME:   Prior to Admission medications   Medication Sig Start Date End Date Taking? Authorizing Provider  amLODipine (NORVASC) 5 MG tablet Take 1 tablet (5 mg total) by mouth daily. 08/17/16  Yes Viviana Simpler I, MD  buPROPion (WELLBUTRIN XL) 150 MG 24 hr tablet TAKE ONE TABLET BY MOUTH EVERY DAY 04/10/17  Yes [name], MD  clonazePAM (KLONOPIN) 0.5 MG tablet TAKE 1 OR 2 TABLETS BY MOUTH AT BEDTIME 06/28/17  Yes [name], MD  finasteride (PROSCAR) 5 MG tablet TAKE ONE TABLET BY MOUTH EVERY DAY 05/29/17  Yes [name], MD  losartan-hydrochlorothiazide (HYZAAR) 100-25 MG tablet TAKE ONE TABLET BY MOUTH EVERY DAY 08/23/17  Yes [name], MD  Multiple Vitamin (MULTIVITAMIN) capsule Take 1 capsule by mouth daily.     Yes [provider]  omeprazole (PRILOSEC) 40 MG capsule TAKE ONE CAPSULE BY MOUTH EVERY DAY 05/15/17  Yes [name], MD  pramipexole (MIRAPEX) 1 MG tablet TAKE 1 OR 2 TABLETS AT BEDTIME AS NEEDED 01/23/17  Yes [name], MD  pyridoxine (B-6) 100 MG tablet Take 100 mg by mouth daily.   Yes [provider]  tamsulosin (FLOMAX) 0.4 MG CAPS capsule TAKE ONE CAPSULE BY MOUTH EVERY DAY 08/23/17  Yes [name], MD  furosemide (LASIX) 40 MG tablet Take 1 tablet (40 mg total) by mouth daily as needed. Patient not taking: Reported on 09/14/2017 05/15/17   [name], MD  citalopram (CELEXA) 10 MG tablet Take 1 tablet (10 mg total) by mouth daily. 04/22/11 05/18/11  [name], MD      VITAL SIGNS:  Blood pressure (!) 170/96, pulse 81, temperature 97.7 F (36.5 C), temperature source Oral, resp. rate 16, height 5\' 6"  (1.676 m), weight 80.7 kg (178 lb), SpO2 96 %.  PHYSICAL EXAMINATION:  Physical Exam  GENERAL:  84 y.o.-year-old patient lying in the bed with no acute distress.  EYES: Pupils equal, round, reactive to light and accommodation. No scleral icterus. Extraocular muscles intact.  HEENT: Head atraumatic, normocephalic. Oropharynx and nasopharynx clear. No oropharyngeal erythema, moist oral mucosa  NECK:  Supple, no jugular venous distention. No thyroid enlargement, no tenderness.  LUNGS: Normal breath sounds bilaterally, no wheezing, rales, rhonchi. No use of accessory muscles of respiration.  CARDIOVASCULAR: S1, S2 RRR.  No murmurs, rubs, gallops, clicks.  ABDOMEN: Soft, nontender, nondistended. Bowel sounds present. No organomegaly or mass.  EXTREMITIES: No pedal edema, cyanosis, or clubbing. + 2 pedal & radial pulses b/l.   NEUROLOGIC: Cranial nerves II through XII are intact. No focal Motor or sensory deficits appreciated b/l PSYCHIATRIC: The patient is alert and oriented x 3.  SKIN: No obvious rash, lesion, or ulcer.   LABORATORY PANEL:   CBC Recent Labs  Lab 09/14/17 1223  WBC 6.4  HGB 13.6  HCT 39.2*  PLT 176   ------------------------------------------------------------------------------------------------------------------  Chemistries  Recent Labs  Lab 09/14/17 1223  NA 139  K 3.5  CL 106  CO2 26  GLUCOSE 108*  BUN 29*  CREATININE 1.44*  CALCIUM 9.3  AST 29  ALT 19  ALKPHOS 80  BILITOT  0.8   ------------------------------------------------------------------------------------------------------------------  Cardiac Enzymes Recent Labs  Lab 09/14/17 1223  TROPONINI 0.04*   ------------------------------------------------------------------------------------------------------------------  RADIOLOGY:  Ct Head Wo Contrast  Result Date: 09/14/2017 CLINICAL DATA:  Sudden onset diplopia.  Dizziness. EXAM: CT HEAD WITHOUT CONTRAST TECHNIQUE: Contiguous axial images were obtained from the base of the skull through the vertex without intravenous contrast. COMPARISON:  None. FINDINGS: Brain: There is moderate diffuse atrophy. Note that there is extensive susceptibility artifact on the right side due to a cochlear implant on the right. With this limitation, there is no evident mass, hemorrhage, extra-axial fluid collection, or midline shift. There is small vessel disease in the centra semiovale bilaterally. No evident acute infarct. Vascular: No appreciable hyperdense vessels. There is calcification in each carotid siphon. Skull: Bony calvarium appears intact. Sinuses/Orbits: There is  mucosal thickening in several ethmoid air cells. Other visualized paranasal sinuses are clear. No intraorbital lesions are appreciable on this study. Patient has had cataract extractions bilaterally. Other: Mastoids on the left are clear. There is opacification of mastoids on the right. Postoperative change with cochlear implant noted on the right. IMPRESSION: Susceptibility artifact on the right due to cochlear implant. There is moderate diffuse atrophy. No mass or hemorrhage evident. There is patchy small vessel disease in the centra semiovale bilaterally. No acute infarct evident. Foci of arterial vascular calcification noted. Mucosal thickening in several ethmoid air cells. Extensive opacification of mastoids on the right with postoperative change from cochlear implant placement on the right. Electronically Signed   By: Lowella Grip III M.D.   On: 09/14/2017 13:08     IMPRESSION AND PLAN:   84 year old male with past medical history of essential hypertension, hyperlipidemia, history of sensorineural hearing loss status post cochlear implant, BPH, restless leg syndrome, anxiety who presents to the hospital due to double vision.  1.  TIA-this is working diagnosis given patient's transient double vision.  He denies any further other neurologic symptoms.  Patient CT head is negative for any acute pathology. - We will observe him overnight in the hospital, get a neurology consult, get MRI of the brain.  Check echocardiogram, CTA of the head neck. - Start him on some aspirin, check lipid profile.  2.  History of sensorineural hearing loss status post cochlear implant- discussed with patient's ENT physician who told me that his cochlear implant is safe to have an MRI with, notify the MRI department.  3.  Recent history of essential hypertension- continue with losartan/HCTZ, Norvasc.  4.  GERD-continue Protonix.  5.  History of restless leg syndrome-continue Mirapex.  6.  BPH-no urinary  retention-continue finasteride, Flomax.  7.  Anxiety-continue Klonopin at bedtime.  Continue Wellbutrin.    All the records are reviewed and case discussed with ED provider. Management plans discussed with the patient, family and they are in agreement.  CODE STATUS: Full code  TOTAL TIME TAKING CARE OF THIS PATIENT: 40 minutes.    Henreitta Leber M.D on 09/14/2017 at 2:33 PM  Between 7am to 6pm - Pager - [phone]  After 6pm go to [email or web address] - password [email or web address] [facility]  Office  [phone]  CC: Primary care physician; [name], MD

## 2017-09-14 NOTE — ED Triage Notes (Signed)
Pt to ED from eye doctor (Dr. Holley Bouche)  after having reported sudden onset of diplopia. Pt reporting intermittent double vision and dizziness for the past 2 hours. No other neuro deficits noted and pts significant other confirmed pt is acting at baseline. Pupils are reactive upon assessment. MD sent pt for a r/t stroke and also requesting a CBC, sed rate abd CRP testing be performed in ED.   Pt has a cochlear implant as well.

## 2017-09-15 ENCOUNTER — Observation Stay (HOSPITAL_BASED_OUTPATIENT_CLINIC_OR_DEPARTMENT_OTHER)
Admit: 2017-09-15 | Discharge: 2017-09-15 | Disposition: A | Discharge status: Home / Self Care | Payer: Medicare Other | Attending: Specialist | Admitting: Specialist

## 2017-09-15 DIAGNOSIS — I10 Essential (primary) hypertension: Secondary | ICD-10-CM | POA: Diagnosis not present

## 2017-09-15 DIAGNOSIS — H532 Diplopia: Secondary | ICD-10-CM | POA: Diagnosis not present

## 2017-09-15 DIAGNOSIS — G2581 Restless legs syndrome: Secondary | ICD-10-CM | POA: Diagnosis not present

## 2017-09-15 DIAGNOSIS — G459 Transient cerebral ischemic attack, unspecified: Secondary | ICD-10-CM | POA: Diagnosis not present

## 2017-09-15 DIAGNOSIS — N400 Enlarged prostate without lower urinary tract symptoms: Secondary | ICD-10-CM | POA: Diagnosis not present

## 2017-09-15 DIAGNOSIS — I5030 Unspecified diastolic (congestive) heart failure: Secondary | ICD-10-CM

## 2017-09-15 LAB — LIPID PANEL
Cholesterol: 276 mg/dL — ABNORMAL HIGH (ref 0–200)
HDL: 41 mg/dL (ref >40)
LDL Cholesterol: 200 mg/dL — ABNORMAL HIGH (ref 0–99)
Total CHOL/HDL Ratio: 6.7 RATIO
Triglycerides: 177 mg/dL — ABNORMAL HIGH (ref <150)
VLDL: 35 mg/dL (ref 0–40)

## 2017-09-15 LAB — BASIC METABOLIC PANEL
Anion gap: 6 (ref 5–15)
BUN: 27 mg/dL — ABNORMAL HIGH (ref 8–23)
CO2: 32 mmol/L (ref 22–32)
Calcium: 9.4 mg/dL (ref 8.9–10.3)
Chloride: 104 mmol/L (ref 98–111)
Creatinine, Ser: 1.45 mg/dL — ABNORMAL HIGH (ref 0.61–1.24)
GFR calc Af Amer: 47 mL/min — ABNORMAL LOW (ref >60)
GFR calc non Af Amer: 40 mL/min — ABNORMAL LOW (ref >60)
Glucose, Bld: 98 mg/dL (ref 70–99)
Potassium: 4.9 mmol/L (ref 3.5–5.1)
Sodium: 142 mmol/L (ref 135–145)

## 2017-09-15 LAB — CBC
HCT: 40.1 % (ref 40.0–52.0)
Hemoglobin: 13.9 g/dL (ref 13.0–18.0)
MCH: 31.2 pg (ref 26.0–34.0)
MCHC: 34.5 g/dL (ref 32.0–36.0)
MCV: 90.3 fL (ref 80.0–100.0)
Platelets: 174 10*3/uL (ref 150–440)
RBC: 4.45 MIL/uL (ref 4.40–5.90)
RDW: 14.1 % (ref 11.5–14.5)
WBC: 6 10*3/uL (ref 3.8–10.6)

## 2017-09-15 LAB — ECHOCARDIOGRAM COMPLETE
Height: 66 in
Weight: 2848 oz

## 2017-09-15 LAB — ACETYLCHOLINE RECEPTOR, BINDING: Acety choline binding ab: <0.03 nmol/L (ref 0.00–0.24)

## 2017-09-15 LAB — STRIATED MUSCLE ANTIBODY: Anti-striation Abs: NEGATIVE

## 2017-09-15 MED ORDER — PRAMIPEXOLE DIHYDROCHLORIDE 1 MG PO TABS
1.0000 mg | ORAL_TABLET | Freq: Every evening | ORAL | Status: DC | PRN
  Administered 2017-09-15: 1 mg via ORAL
  Filled 2017-09-15: qty 1

## 2017-09-15 MED ORDER — ASPIRIN 81 MG PO TBEC
81.0000 mg | DELAYED_RELEASE_TABLET | Freq: Every day | ORAL | 1 refills | Status: DC
Start: 2017-09-16 — End: 2020-01-05

## 2017-09-15 NOTE — Progress Notes (Signed)
Discussed discharge instructions and medications with patient. IV removed. All questions addressed. Patient transported home via car by his girlfriend Legrand Rams.  Clarise Cruz, RN, BSN

## 2017-09-15 NOTE — Progress Notes (Signed)
*  PRELIMINARY RESULTS* Echocardiogram 2D Echocardiogram has been performed.  Stephen Garrett 09/15/2017, 9:15 AM

## 2017-09-15 NOTE — Care Management Obs Status (Signed)
[place] NOTIFICATION   Patient Details  Name: Stephen Garrett MRN: [id number] Date of Birth: [birth year]   Medicare Observation Status Notification Given:  No Discharge order placed in < 24hr of being placed on observation   Katrina Stack, RN 09/15/2017, 11:26 AM

## 2017-09-15 NOTE — Consult Note (Signed)
Referring Physician: Posey Pronto    Chief Complaint: Diplopia  HPI: Stephen Garrett is an 84 y.o. male with multiple medical problems who reports the acute onset of vertical diplopia on yesterday morning.  As the day progressed his diplopia became more intermittent and has completely resolved today.  Patient previously on ASA but stopped due to bruising. Initial NIHSS of 0  Date last known well: Date: 09/14/2017 Time last known well: Time: 10:00 tPA Given: No: Resolution of symptoms  Past Medical History:  Diagnosis Date  . Actinic keratosis   . BPH (benign prostatic hypertrophy)   . Diseases of lips   . Diverticulosis of colon 06/2003  . ED (erectile dysfunction)   . GERD (gastroesophageal reflux disease)   . Heart murmur    as child  . HLD (hyperlipidemia)   . HOH (hard of hearing)   . HTN (hypertension)   . Osteoarthritis of knee    severe, bilat  . Renal insufficiency   . Restless legs syndrome (RLS)   . Sleep disturbance, unspecified   . Spinal stenosis, lumbar region, without neurogenic claudication     Past Surgical History:  Procedure Laterality Date  . CATARACT EXTRACTION, BILATERAL  2011   Dr. Thomasene Ripple  . COCHLEAR IMPLANT Right 02/15/2017   Procedure: RIGHT COCHLEAR IMPLANT;  Surgeon: Vicie Mutters, MD;  Location: [place];  Service: ENT;  Laterality: Right;  . FINGER SURGERY     Right 4th finger  . [facility]  10/2009   Dr. Bary Castilla  . LUMBAR SPINE SURGERY  06/2005   stenosis repair  . TONSILLECTOMY  childhood  . TOTAL KNEE ARTHROPLASTY  06/2008   bilat (Dr. Marry Guan)    Family History  Problem Relation Age of Onset  . Other Father        "clogged arteries"  . Hypertension Mother    Social History:  reports that he quit smoking about 47 years ago. He has a 22.50 pack-year smoking history. He has never used smokeless tobacco. He reports that he drinks alcohol. He reports that he does not use drugs.  Allergies:  Allergies  Allergen Reactions  .  Atorvastatin     REACTION: raises blood pressure  . Pravastatin Sodium     REACTION: raises blood pressure  . Statins     REACTION: raises bp    Medications:  I have reviewed the patient's current medications. Prior to Admission:  Medications Prior to Admission  Medication Sig Dispense Refill Last Dose  . amLODipine (NORVASC) 5 MG tablet Take 1 tablet (5 mg total) by mouth daily. 90 tablet 3 09/14/2017 at Unknown time  . buPROPion (WELLBUTRIN XL) 150 MG 24 hr tablet TAKE ONE TABLET BY MOUTH EVERY DAY 90 tablet 3 09/14/2017 at Unknown time  . clonazePAM (KLONOPIN) 0.5 MG tablet TAKE 1 OR 2 TABLETS BY MOUTH AT BEDTIME 180 tablet 0 09/13/2017 at Unknown time  . finasteride (PROSCAR) 5 MG tablet TAKE ONE TABLET BY MOUTH EVERY DAY 90 tablet 3 09/14/2017 at Unknown time  . losartan-hydrochlorothiazide (HYZAAR) 100-25 MG tablet TAKE ONE TABLET BY MOUTH EVERY DAY 90 tablet 3 09/14/2017 at Unknown time  . Multiple Vitamin (MULTIVITAMIN) capsule Take 1 capsule by mouth daily.     09/14/2017 at Unknown time  . omeprazole (PRILOSEC) 40 MG capsule TAKE ONE CAPSULE BY MOUTH EVERY DAY 90 capsule 3 09/14/2017 at Unknown time  . pramipexole (MIRAPEX) 1 MG tablet TAKE 1 OR 2 TABLETS AT BEDTIME AS NEEDED 180 tablet 3 09/13/2017 at  Unknown time  . pyridoxine (B-6) 100 MG tablet Take 100 mg by mouth daily.   09/14/2017 at Unknown time  . tamsulosin (FLOMAX) 0.4 MG CAPS capsule TAKE ONE CAPSULE BY MOUTH EVERY DAY 90 capsule 3 09/13/2017 at Unknown time  . furosemide (LASIX) 40 MG tablet Take 1 tablet (40 mg total) by mouth daily as needed. (Patient not taking: Reported on 09/14/2017) 30 tablet 1 Not Taking at Unknown time   Scheduled: . amLODipine  5 mg Oral Daily  . aspirin EC  81 mg Oral Daily  . buPROPion  150 mg Oral Daily  . clonazePAM  0.5 mg Oral QHS  . finasteride  5 mg Oral Daily  . heparin  5,000 Units Subcutaneous Q8H  . losartan  100 mg Oral Daily   And  . hydrochlorothiazide  25 mg Oral Daily  .  multivitamin with minerals  1 tablet Oral Daily  . pantoprazole  40 mg Oral Daily  . pyridoxine  100 mg Oral Daily  . tamsulosin  0.4 mg Oral Daily    ROS: History obtained from the patient  General ROS: negative for - chills, fatigue, fever, night sweats, weight gain or weight loss Psychological ROS: negative for - behavioral disorder, hallucinations, memory difficulties, mood swings or suicidal ideation Ophthalmic ROS: as noted in HPI, decreased vision due to eyelids ENT ROS: negative for - epistaxis, nasal discharge, oral lesions, sore throat, tinnitus or vertigo Allergy and Immunology ROS: negative for - hives or itchy/watery eyes Hematological and Lymphatic ROS: negative for - bleeding problems, bruising or swollen lymph nodes Endocrine ROS: negative for - galactorrhea, hair pattern changes, polydipsia/polyuria or temperature intolerance Respiratory ROS: negative for - cough, hemoptysis, shortness of breath or wheezing Cardiovascular ROS: negative for - chest pain, dyspnea on exertion, edema or irregular heartbeat Gastrointestinal ROS: negative for - abdominal pain, diarrhea, hematemesis, nausea/vomiting or stool incontinence Genito-Urinary ROS: negative for - dysuria, hematuria, incontinence or urinary frequency/urgency Musculoskeletal ROS: negative for - joint swelling or muscular weakness Neurological ROS: as noted in HPI Dermatological ROS: negative for rash and skin lesion changes  Physical Examination: Blood pressure (!) 179/90, pulse 64, temperature 97.6 F (36.4 C), temperature source Oral, resp. rate 18, height 5' 6"  (1.676 m), weight 80.7 kg (178 lb), SpO2 99 %.  HEENT-  Normocephalic, no lesions, without obvious abnormality.  Normal external eye and conjunctiva.  Normal TM's bilaterally.  Normal auditory canals and external ears. Normal external nose, mucus membranes and septum.  Normal pharynx. Cardiovascular- S1, S2 normal, pulses palpable throughout   Lungs- chest  clear, no wheezing, rales, normal symmetric air entry Abdomen- soft, non-tender; bowel sounds normal; no masses,  no organomegaly Extremities- no edema Lymph-no adenopathy palpable Musculoskeletal-no joint tenderness, deformity or swelling Skin-warm and dry, no hyperpigmentation, vitiligo, or suspicious lesions  Neurological Examination   Mental Status: Alert, oriented, thought content appropriate.  Speech fluent without evidence of aphasia.  Able to follow 3 step commands without difficulty. Cranial Nerves: II: Discs flat bilaterally; Visual fields grossly normal, pupils equal, round, reactive to light and accommodation III,IV, VI: bilaterally ptotic, extra-ocular motions intact bilaterally V,VII: smile symmetric, facial light touch sensation normal bilaterally VIII: hearing normal bilaterally IX,X: gag reflex present XI: bilateral shoulder shrug XII: midline tongue extension Motor: Right : Upper extremity   5/5    Left:     Upper extremity   5/5  Lower extremity   5/5     Lower extremity   5/5 Tone and bulk:normal tone throughout;  no atrophy noted Sensory: Pinprick and light touch intact throughout, bilaterally Deep Tendon Reflexes: 2+ in the upper extremities and the right KJ.  Absent otherwise Plantars: Right: downgoing   Left: downgoing Cerebellar: Normal finger-to-nose and normal heel-to-shin testing bilaterally Gait: normal gait    Laboratory Studies:  Basic Metabolic Panel: Recent Labs  Lab 09/14/17 1223 09/15/17 0331  NA 139 142  K 3.5 4.9  CL 106 104  CO2 26 32  GLUCOSE 108* 98  BUN 29* 27*  CREATININE 1.44* 1.45*  CALCIUM 9.3 9.4    Liver Function Tests: Recent Labs  Lab 09/14/17 1223  AST 29  ALT 19  ALKPHOS 80  BILITOT 0.8  PROT 6.6  ALBUMIN 3.9   No results for input(s): LIPASE, AMYLASE in the last 168 hours. No results for input(s): AMMONIA in the last 168 hours.  CBC: Recent Labs  Lab 09/14/17 1223 09/15/17 0331  WBC 6.4 6.0   NEUTROABS 4.4  --   HGB 13.6 13.9  HCT 39.2* 40.1  MCV 90.4 90.3  PLT 176 174    Cardiac Enzymes: Recent Labs  Lab 09/14/17 1223  TROPONINI 0.04*    BNP: Invalid input(s): POCBNP  CBG: No results for input(s): GLUCAP in the last 168 hours.  Microbiology: No results found for this or any previous visit.  Coagulation Studies: Recent Labs    09/14/17 1223  LABPROT 11.9  INR 0.88    Urinalysis: No results for input(s): COLORURINE, LABSPEC, PHURINE, GLUCOSEU, HGBUR, BILIRUBINUR, KETONESUR, PROTEINUR, UROBILINOGEN, NITRITE, LEUKOCYTESUR in the last 168 hours.  Invalid input(s): APPERANCEUR  Lipid Panel:    Component Value Date/Time   CHOL 276 (H) 09/15/2017 0331   TRIG 177 (H) 09/15/2017 0331   HDL 41 09/15/2017 0331   CHOLHDL 6.7 09/15/2017 0331   VLDL 35 09/15/2017 0331   LDLCALC 200 (H) 09/15/2017 0331    HgbA1C: No results found for: HGBA1C  Urine Drug Screen:  No results found for: LABOPIA, COCAINSCRNUR, LABBENZ, AMPHETMU, THCU, LABBARB  Alcohol Level: No results for input(s): ETH in the last 168 hours.  Other results: EKG: sinus rhythm at 68 bpm.  Imaging: Ct Angio Head W Or Wo Contrast  Result Date: 09/14/2017 CLINICAL DATA:  Sudden onset double vision. EXAM: CT ANGIOGRAPHY HEAD AND NECK TECHNIQUE: Multidetector CT imaging of the head and neck was performed using the standard protocol during bolus administration of intravenous contrast. Multiplanar CT image reconstructions and MIPs were obtained to evaluate the vascular anatomy. Carotid stenosis measurements (when applicable) are obtained utilizing NASCET criteria, using the distal internal carotid diameter as the denominator. CONTRAST:  67m ISOVUE-370 IOPAMIDOL (ISOVUE-370) INJECTION 76% COMPARISON:  Head CT 09/14/2017 FINDINGS: CTA NECK FINDINGS AORTIC ARCH: There is mild calcific atherosclerosis of the aortic arch. There is no aneurysm, dissection or hemodynamically significant stenosis of the  visualized ascending aorta and aortic arch. Conventional 3 vessel aortic branching pattern. The visualized proximal subclavian arteries are widely patent. RIGHT CAROTID SYSTEM: --Common carotid artery: Widely patent origin without common carotid artery dissection or aneurysm. --Internal carotid artery: No dissection, occlusion or aneurysm. Mixed calcified and non-calcified atherosclerotic disease at the bifurcation, extending into the internal carotid artery, resulting in no hemodynamically significant stenosis. --External carotid artery: No acute abnormality. LEFT CAROTID SYSTEM: --Common carotid artery: Widely patent origin without common carotid artery dissection or aneurysm. --Internal carotid artery:No dissection, occlusion or aneurysm. Mild atherosclerotic calcification at the carotid bifurcation without hemodynamically significant stenosis. --External carotid artery: No acute abnormality. VERTEBRAL ARTERIES: Left dominant configuration.  Both origins are normal. No dissection, occlusion or flow-limiting stenosis to the vertebrobasilar confluence. SKELETON: There is no bony spinal canal stenosis. No lytic or blastic lesion. OTHER NECK: Normal pharynx, larynx and major salivary glands. No cervical lymphadenopathy. Unremarkable thyroid gland. UPPER CHEST: No pneumothorax or pleural effusion. No nodules or masses. CTA HEAD FINDINGS ANTERIOR CIRCULATION: --Intracranial internal carotid arteries: Normal. --Anterior cerebral arteries: Normal. Both A1 segments are present. Patent anterior communicating artery. --Middle cerebral arteries: Normal. --Posterior communicating arteries: Absent bilaterally. POSTERIOR CIRCULATION: --Basilar artery: Normal. --Posterior cerebral arteries: Normal. --Superior cerebellar arteries: Normal. --Inferior cerebellar arteries: Normal anterior and posterior inferior cerebellar arteries. VENOUS SINUSES: As permitted by contrast timing, patent. ANATOMIC VARIANTS: None DELAYED PHASE: No  parenchymal contrast enhancement. Review of the MIP images confirms the above findings. IMPRESSION: 1. No emergent large vessel occlusion or hemodynamically significant stenosis. 2. Bilateral carotid bifurcation atherosclerosis without hemodynamically significant stenosis. 3.  Aortic Atherosclerosis (ICD10-I70.0). 4. Right mastoid opacification with right-sided cochlear implant. Electronically Signed   By: Ulyses Jarred M.D.   On: 09/14/2017 18:08   Ct Head Wo Contrast  Result Date: 09/14/2017 CLINICAL DATA:  Sudden onset diplopia.  Dizziness. EXAM: CT HEAD WITHOUT CONTRAST TECHNIQUE: Contiguous axial images were obtained from the base of the skull through the vertex without intravenous contrast. COMPARISON:  None. FINDINGS: Brain: There is moderate diffuse atrophy. Note that there is extensive susceptibility artifact on the right side due to a cochlear implant on the right. With this limitation, there is no evident mass, hemorrhage, extra-axial fluid collection, or midline shift. There is small vessel disease in the centra semiovale bilaterally. No evident acute infarct. Vascular: No appreciable hyperdense vessels. There is calcification in each carotid siphon. Skull: Bony calvarium appears intact. Sinuses/Orbits: There is mucosal thickening in several ethmoid air cells. Other visualized paranasal sinuses are clear. No intraorbital lesions are appreciable on this study. Patient has had cataract extractions bilaterally. Other: Mastoids on the left are clear. There is opacification of mastoids on the right. Postoperative change with cochlear implant noted on the right. IMPRESSION: Susceptibility artifact on the right due to cochlear implant. There is moderate diffuse atrophy. No mass or hemorrhage evident. There is patchy small vessel disease in the centra semiovale bilaterally. No acute infarct evident. Foci of arterial vascular calcification noted. Mucosal thickening in several ethmoid air cells. Extensive  opacification of mastoids on the right with postoperative change from cochlear implant placement on the right. Electronically Signed   By: Lowella Grip III M.D.   On: 09/14/2017 13:08   Ct Angio Neck W Or Wo Contrast  Result Date: 09/14/2017 CLINICAL DATA:  Acute onset intermittent diplopia. Assess for stroke. History of cochlear implant, hypertension, hyperlipidemia. EXAM: CT ANGIOGRAPHY HEAD AND NECK TECHNIQUE: Multidetector CT imaging of the head and neck was performed using the standard protocol during bolus administration of intravenous contrast. Multiplanar CT image reconstructions and MIPs were obtained to evaluate the vascular anatomy. Carotid stenosis measurements (when applicable) are obtained utilizing NASCET criteria, using the distal internal carotid diameter as the denominator. CONTRAST:  68m ISOVUE-370 IOPAMIDOL (ISOVUE-370) INJECTION 76% COMPARISON:  CT HEAD September 14, 2017 FINDINGS: CTA NECK FINDINGS: AORTIC ARCH: Normal appearance of the thoracic arch, normal branch pattern. Moderate calcific atherosclerosis. The origins of the innominate, left Common carotid artery and subclavian artery are patent. Moderate stenosis distal LEFT subclavian artery. RIGHT subclavian artery obscured by venous contamination, probable calcifications. RIGHT CAROTID SYSTEM: Common carotid artery is patent. Eccentric intimal thickening calcific atherosclerosis result in less than  50% stenosis by NASCET criteria. Patent internal carotid artery. LEFT CAROTID SYSTEM: Common carotid artery is patent. Moderate calcific atherosclerosis carotid bifurcation without hemodynamically significant stenosis by NASCET criteria. Normal appearance of the internal carotid artery. VERTEBRAL ARTERIES:Left vertebral artery is dominant. Extrinsic compression of the vertebral artery due to degenerative cervical spine. Patent vertebral arteries. SKELETON: No acute osseous process though bone windows have not been submitted. Osteopenia.  Grade 1 C3-4 and C4-5 anterolisthesis. Severe upper cervical facet arthropathy. Severe C3-4 and C4-5 neural foraminal narrowing. OTHER NECK: Soft tissues of the neck are nonacute though, not tailored for evaluation. Patulous esophagus with air-fluid level. UPPER CHEST: Included lung apices are clear. No superior mediastinal lymphadenopathy. CTA HEAD FINDINGS: ANTERIOR CIRCULATION: Patent cervical internal carotid arteries, petrous, cavernous and supra clinoid internal carotid arteries. Patent bilateral ophthalmic arteries. Bilateral posterior communicating artery infundibulum. Patent anterior communicating artery. Patent anterior and middle cerebral arteries, multifocal mild stenosis compatible with atherosclerosis. No large vessel occlusion, significant stenosis, contrast extravasation or aneurysm. POSTERIOR CIRCULATION: Patent vertebral arteries, vertebrobasilar junction and basilar artery, as well as main branch vessels. Patent posterior cerebral arteries mild luminal irregularity compatible with atherosclerosis. No large vessel occlusion, significant stenosis, contrast extravasation or aneurysm. VENOUS SINUSES: Major dural venous sinuses are patent though not tailored for evaluation on this angiographic examination. ANATOMIC VARIANTS: None. DELAYED PHASE: No abnormal enhancement, limited assessment due to beam hardening artifact from RIGHT cochlear implant. MIP images reviewed. IMPRESSION: CTA NECK: 1. Less than 50% stenosis RIGHT ICA. Patulous esophagus with air-fluid level, aspiration risk. 2. Patent vertebral arteries. CTA HEAD: 1. No emergent large vessel occlusion or flow-limiting stenosis. 2. Mild cerebral artery atherosclerosis. Aortic Atherosclerosis (ICD10-I70.0). Electronically Signed   By: Elon Alas M.D.   On: 09/14/2017 18:10   Mr Brain Wo Contrast  Result Date: 09/14/2017 CLINICAL DATA:  84 y/o M; sudden onset diplopia, intermittent double vision, and dizziness for the past 2 hours.  EXAM: MRI HEAD WITHOUT CONTRAST TECHNIQUE: Multiplanar, multiecho pulse sequences of the brain and surrounding structures were obtained without intravenous contrast. COMPARISON:  09/14/2017 CT head and CTA head FINDINGS: Brain: Artifact from cochlear implant completely obscures the diffusion weighted and gradient echo sequences. On the axial T2 weighted sequence the right hemisphere is partially obscured by artifact from the cochlear implant. There are nonspecific foci of T2 hyperintensity in subcortical and periventricular white matter compatible with mild chronic microvascular ischemic changes for age and mild volume loss of the brain. No extra-axial collection, hydrocephalus, or herniation is identified. Vascular: Normal flow voids. Skull and upper cervical spine: Normal marrow signal. Sinuses/Orbits: Right mastoid effusion. No abnormal signal of mastoid air cells. Bilateral intra-ocular lens replacement. Other: None. IMPRESSION: 1. Extensive artifact from the cochlear implant degrades diffusion and blood sensitive sequences. 2. Stable chronic microvascular ischemic changes and volume loss of the brain given differences in technique. 3. Right mastoid effusion. Electronically Signed   By: Kristine Garbe M.D.   On: 09/14/2017 22:43    Assessment: 84 y.o. male presenting with complaints of vertical diplopia.  MRI of the brain reviewed.  Significant artifact noted from implant but no obvious acute infarct noted.  Can not rule out TIA.  MG is on the differential as well.  Patient on no antiplatelet therapy at home.  CTA shows no hemodynamically significant stenosis.  Echocardiogram is pending. ESR, CRP are normal.  Myasthenia panel is pending.  LDL 200.  Patient intolerant of statins.    Stroke Risk Factors - hyperlipidemia and hypertension  Plan: 1.  PT consult, OT consult, Speech consult 2. Echocardiogram pending 3. Prophylactic therapy-Antiplatelet med: Aspirin - dose 78m daily 4. Telemetry  monitoring 5. Patient to follow up with neurology on an outpatient basis.  Myasthenia panel to be followed up upon at that time.  Patient to return if recurrent symptoms.   LAlexis Goodell MD Neurology [phone]/19/2019, 10:10 AM

## 2017-09-15 NOTE — Discharge Summary (Signed)
[facility] [place] NAME: Stephen Garrett    MR#:  [id number]  DATE OF BIRTH:  [birth year]  DATE OF ADMISSION:  09/14/2017 ADMITTING PHYSICIAN: Henreitta Leber, MD  DATE OF DISCHARGE: 09/15/2017  PRIMARY CARE PHYSICIAN: [name], MD    ADMISSION DIAGNOSIS:  Diplopia [H53.2] TIA (transient ischemic attack) [G45.9]  DISCHARGE DIAGNOSIS:  TIA--double vision resolved  SECONDARY DIAGNOSIS:   Past Medical History:  Diagnosis Date  . Actinic keratosis   . BPH (benign prostatic hypertrophy)   . Diseases of lips   . Diverticulosis of colon 06/2003  . ED (erectile dysfunction)   . GERD (gastroesophageal reflux disease)   . Heart murmur    as child  . HLD (hyperlipidemia)   . HOH (hard of hearing)   . HTN (hypertension)   . Osteoarthritis of knee    severe, bilat  . Renal insufficiency   . Restless legs syndrome (RLS)   . Sleep disturbance, unspecified   . Spinal stenosis, lumbar region, without neurogenic claudication     HOSPITAL COURSE:  84 year old male with past medical history of essential hypertension, hyperlipidemia, history of sensorineural hearing loss status post cochlear implant, BPH, restless leg syndrome, anxiety who presents to the hospital due to double vision.  1.  TIA-this is working diagnosis given patient's transient double vision.  He denies any further other neurologic symptoms.  Patient CT head is negative for any acute pathology. - pt was observed overnight in the hospital, -seen by  neurology --ASA 81 mg qd - MRI of the brain no acute stroke, lot of artifact - echocardiogram done results pending - CTA of the head neck nothing acute  2.  History of sensorineural hearing loss status post cochlear implant- discussed with patient's ENT physician who told me that his cochlear implant is safe to have an MRI with, notify the MRI department.  3.  Recent history of essential hypertension- continue  with losartan/HCTZ, Norvasc.  4.  GERD-continue Protonix.  5.  History of restless leg syndrome-continue Mirapex.  6.  BPH-no urinary retention-continue finasteride, Flomax.  7.  Anxiety-continue Klonopin at bedtime.  Continue Wellbutrin.  8. HL pt allergic to statins  Overall stable d/c home with PCP f/u in 1 week Pt agreeable.  CONSULTS OBTAINED:  Treatment Team:  Catarina Hartshorn, MD Alexis Goodell, MD  DRUG ALLERGIES:   Allergies  Allergen Reactions  . Atorvastatin     REACTION: raises blood pressure  . Pravastatin Sodium     REACTION: raises blood pressure  . Statins     REACTION: raises bp    DISCHARGE MEDICATIONS:   Allergies as of 09/15/2017      Reactions   Atorvastatin    REACTION: raises blood pressure   Pravastatin Sodium    REACTION: raises blood pressure   Statins    REACTION: raises bp      Medication List    STOP taking these medications   furosemide 40 MG tablet Commonly known as:  LASIX     TAKE these medications   amLODipine 5 MG tablet Commonly known as:  NORVASC Take 1 tablet (5 mg total) by mouth daily.   aspirin 81 MG EC tablet Take 1 tablet (81 mg total) by mouth daily. Start taking on:  09/16/2017   buPROPion 150 MG 24 hr tablet Commonly known as:  WELLBUTRIN XL TAKE ONE TABLET BY MOUTH EVERY DAY   clonazePAM 0.5 MG tablet Commonly known as:  [employer]  TAKE 1 OR 2 TABLETS BY MOUTH AT BEDTIME   finasteride 5 MG tablet Commonly known as:  PROSCAR TAKE ONE TABLET BY MOUTH EVERY DAY   losartan-hydrochlorothiazide 100-25 MG tablet Commonly known as:  HYZAAR TAKE ONE TABLET BY MOUTH EVERY DAY   multivitamin capsule Take 1 capsule by mouth daily.   omeprazole 40 MG capsule Commonly known as:  PRILOSEC TAKE ONE CAPSULE BY MOUTH EVERY DAY   pramipexole 1 MG tablet Commonly known as:  MIRAPEX TAKE 1 OR 2 TABLETS AT BEDTIME AS NEEDED   pyridoxine 100 MG tablet Commonly known as:  B-6 Take 100 mg by mouth  daily.   tamsulosin 0.4 MG Caps capsule Commonly known as:  FLOMAX TAKE ONE CAPSULE BY MOUTH EVERY DAY       If you experience worsening of your admission symptoms, develop shortness of breath, life threatening emergency, suicidal or homicidal thoughts you must seek medical attention immediately by calling 911 or calling your MD immediately  if symptoms less severe.  You Must read complete instructions/literature along with all the possible adverse reactions/side effects for all the Medicines you take and that have been prescribed to you. Take any new Medicines after you have completely understood and accept all the possible adverse reactions/side effects.   Please note  You were cared for by a hospitalist during your hospital stay. If you have any questions about your discharge medications or the care you received while you were in the hospital after you are discharged, you can call the unit and asked to speak with the hospitalist on call if the hospitalist that took care of you is not available. Once you are discharged, your primary care physician will handle any further medical issues. Please note that NO REFILLS for any discharge medications will be authorized once you are discharged, as it is imperative that you return to your primary care physician (or establish a relationship with a primary care physician if you do not have one) for your aftercare needs so that they can reassess your need for medications and monitor your lab values. Today   SUBJECTIVE   I am ready to go home. My vision is fine!  VITAL SIGNS:  Blood pressure (!) 179/90, pulse 64, temperature 97.6 F (36.4 C), temperature source Oral, resp. rate 20, height 5\' 6"  (1.676 m), weight 80.7 kg (178 lb), SpO2 96 %.  I/O:  No intake or output data in the 24 hours ending 09/15/17 1017  PHYSICAL EXAMINATION:  GENERAL:  84 y.o.-year-old patient lying in the bed with no acute distress.  EYES: Pupils equal, round, reactive to  light and accommodation. No scleral icterus. Extraocular muscles intact.  HEENT: Head atraumatic, normocephalic. Oropharynx and nasopharynx clear.  NECK:  Supple, no jugular venous distention. No thyroid enlargement, no tenderness.  LUNGS: Normal breath sounds bilaterally, no wheezing, rales,rhonchi or crepitation. No use of accessory muscles of respiration.  CARDIOVASCULAR: S1, S2 normal. No murmurs, rubs, or gallops.  ABDOMEN: Soft, non-tender, non-distended. Bowel sounds present. No organomegaly or mass.  EXTREMITIES: No pedal edema, cyanosis, or clubbing.  NEUROLOGIC: Cranial nerves II through XII are intact. Muscle strength 5/5 in all extremities. Sensation intact. Gait not checked.  PSYCHIATRIC: The patient is alert and oriented x 3.  SKIN: No obvious rash, lesion, or ulcer.   DATA REVIEW:   CBC  Recent Labs  Lab 09/15/17 0331  WBC 6.0  HGB 13.9  HCT 40.1  PLT 174    Chemistries  Recent Labs  Lab  09/14/17 1223 09/15/17 0331  NA 139 142  K 3.5 4.9  CL 106 104  CO2 26 32  GLUCOSE 108* 98  BUN 29* 27*  CREATININE 1.44* 1.45*  CALCIUM 9.3 9.4  AST 29  --   ALT 19  --   ALKPHOS 80  --   BILITOT 0.8  --     Microbiology Results   No results found for this or any previous visit (from the past 240 hour(s)).  RADIOLOGY:  Ct Angio Head W Or Wo Contrast  Result Date: 09/14/2017 CLINICAL DATA:  Sudden onset double vision. EXAM: CT ANGIOGRAPHY HEAD AND NECK TECHNIQUE: Multidetector CT imaging of the head and neck was performed using the standard protocol during bolus administration of intravenous contrast. Multiplanar CT image reconstructions and MIPs were obtained to evaluate the vascular anatomy. Carotid stenosis measurements (when applicable) are obtained utilizing NASCET criteria, using the distal internal carotid diameter as the denominator. CONTRAST:  68mL ISOVUE-370 IOPAMIDOL (ISOVUE-370) INJECTION 76% COMPARISON:  Head CT 09/14/2017 FINDINGS: CTA NECK FINDINGS AORTIC  ARCH: There is mild calcific atherosclerosis of the aortic arch. There is no aneurysm, dissection or hemodynamically significant stenosis of the visualized ascending aorta and aortic arch. Conventional 3 vessel aortic branching pattern. The visualized proximal subclavian arteries are widely patent. RIGHT CAROTID SYSTEM: --Common carotid artery: Widely patent origin without common carotid artery dissection or aneurysm. --Internal carotid artery: No dissection, occlusion or aneurysm. Mixed calcified and non-calcified atherosclerotic disease at the bifurcation, extending into the internal carotid artery, resulting in no hemodynamically significant stenosis. --External carotid artery: No acute abnormality. LEFT CAROTID SYSTEM: --Common carotid artery: Widely patent origin without common carotid artery dissection or aneurysm. --Internal carotid artery:No dissection, occlusion or aneurysm. Mild atherosclerotic calcification at the carotid bifurcation without hemodynamically significant stenosis. --External carotid artery: No acute abnormality. VERTEBRAL ARTERIES: Left dominant configuration. Both origins are normal. No dissection, occlusion or flow-limiting stenosis to the vertebrobasilar confluence. SKELETON: There is no bony spinal canal stenosis. No lytic or blastic lesion. OTHER NECK: Normal pharynx, larynx and major salivary glands. No cervical lymphadenopathy. Unremarkable thyroid gland. UPPER CHEST: No pneumothorax or pleural effusion. No nodules or masses. CTA HEAD FINDINGS ANTERIOR CIRCULATION: --Intracranial internal carotid arteries: Normal. --Anterior cerebral arteries: Normal. Both A1 segments are present. Patent anterior communicating artery. --Middle cerebral arteries: Normal. --Posterior communicating arteries: Absent bilaterally. POSTERIOR CIRCULATION: --Basilar artery: Normal. --Posterior cerebral arteries: Normal. --Superior cerebellar arteries: Normal. --Inferior cerebellar arteries: Normal anterior  and posterior inferior cerebellar arteries. VENOUS SINUSES: As permitted by contrast timing, patent. ANATOMIC VARIANTS: None DELAYED PHASE: No parenchymal contrast enhancement. Review of the MIP images confirms the above findings. IMPRESSION: 1. No emergent large vessel occlusion or hemodynamically significant stenosis. 2. Bilateral carotid bifurcation atherosclerosis without hemodynamically significant stenosis. 3.  Aortic Atherosclerosis (ICD10-I70.0). 4. Right mastoid opacification with right-sided cochlear implant. Electronically Signed   By: Ulyses Jarred M.D.   On: 09/14/2017 18:08   Ct Head Wo Contrast  Result Date: 09/14/2017 CLINICAL DATA:  Sudden onset diplopia.  Dizziness. EXAM: CT HEAD WITHOUT CONTRAST TECHNIQUE: Contiguous axial images were obtained from the base of the skull through the vertex without intravenous contrast. COMPARISON:  None. FINDINGS: Brain: There is moderate diffuse atrophy. Note that there is extensive susceptibility artifact on the right side due to a cochlear implant on the right. With this limitation, there is no evident mass, hemorrhage, extra-axial fluid collection, or midline shift. There is small vessel disease in the centra semiovale bilaterally. No evident acute infarct. Vascular: No  appreciable hyperdense vessels. There is calcification in each carotid siphon. Skull: Bony calvarium appears intact. Sinuses/Orbits: There is mucosal thickening in several ethmoid air cells. Other visualized paranasal sinuses are clear. No intraorbital lesions are appreciable on this study. Patient has had cataract extractions bilaterally. Other: Mastoids on the left are clear. There is opacification of mastoids on the right. Postoperative change with cochlear implant noted on the right. IMPRESSION: Susceptibility artifact on the right due to cochlear implant. There is moderate diffuse atrophy. No mass or hemorrhage evident. There is patchy small vessel disease in the centra semiovale  bilaterally. No acute infarct evident. Foci of arterial vascular calcification noted. Mucosal thickening in several ethmoid air cells. Extensive opacification of mastoids on the right with postoperative change from cochlear implant placement on the right. Electronically Signed   By: Lowella Grip III M.D.   On: 09/14/2017 13:08   Ct Angio Neck W Or Wo Contrast  Result Date: 09/14/2017 CLINICAL DATA:  Acute onset intermittent diplopia. Assess for stroke. History of cochlear implant, hypertension, hyperlipidemia. EXAM: CT ANGIOGRAPHY HEAD AND NECK TECHNIQUE: Multidetector CT imaging of the head and neck was performed using the standard protocol during bolus administration of intravenous contrast. Multiplanar CT image reconstructions and MIPs were obtained to evaluate the vascular anatomy. Carotid stenosis measurements (when applicable) are obtained utilizing NASCET criteria, using the distal internal carotid diameter as the denominator. CONTRAST:  52mL ISOVUE-370 IOPAMIDOL (ISOVUE-370) INJECTION 76% COMPARISON:  CT HEAD September 14, 2017 FINDINGS: CTA NECK FINDINGS: AORTIC ARCH: Normal appearance of the thoracic arch, normal branch pattern. Moderate calcific atherosclerosis. The origins of the innominate, left Common carotid artery and subclavian artery are patent. Moderate stenosis distal LEFT subclavian artery. RIGHT subclavian artery obscured by venous contamination, probable calcifications. RIGHT CAROTID SYSTEM: Common carotid artery is patent. Eccentric intimal thickening calcific atherosclerosis result in less than 50% stenosis by NASCET criteria. Patent internal carotid artery. LEFT CAROTID SYSTEM: Common carotid artery is patent. Moderate calcific atherosclerosis carotid bifurcation without hemodynamically significant stenosis by NASCET criteria. Normal appearance of the internal carotid artery. VERTEBRAL ARTERIES:Left vertebral artery is dominant. Extrinsic compression of the vertebral artery due to  degenerative cervical spine. Patent vertebral arteries. SKELETON: No acute osseous process though bone windows have not been submitted. Osteopenia. Grade 1 C3-4 and C4-5 anterolisthesis. Severe upper cervical facet arthropathy. Severe C3-4 and C4-5 neural foraminal narrowing. OTHER NECK: Soft tissues of the neck are nonacute though, not tailored for evaluation. Patulous esophagus with air-fluid level. UPPER CHEST: Included lung apices are clear. No superior mediastinal lymphadenopathy. CTA HEAD FINDINGS: ANTERIOR CIRCULATION: Patent cervical internal carotid arteries, petrous, cavernous and supra clinoid internal carotid arteries. Patent bilateral ophthalmic arteries. Bilateral posterior communicating artery infundibulum. Patent anterior communicating artery. Patent anterior and middle cerebral arteries, multifocal mild stenosis compatible with atherosclerosis. No large vessel occlusion, significant stenosis, contrast extravasation or aneurysm. POSTERIOR CIRCULATION: Patent vertebral arteries, vertebrobasilar junction and basilar artery, as well as main branch vessels. Patent posterior cerebral arteries mild luminal irregularity compatible with atherosclerosis. No large vessel occlusion, significant stenosis, contrast extravasation or aneurysm. VENOUS SINUSES: Major dural venous sinuses are patent though not tailored for evaluation on this angiographic examination. ANATOMIC VARIANTS: None. DELAYED PHASE: No abnormal enhancement, limited assessment due to beam hardening artifact from RIGHT cochlear implant. MIP images reviewed. IMPRESSION: CTA NECK: 1. Less than 50% stenosis RIGHT ICA. Patulous esophagus with air-fluid level, aspiration risk. 2. Patent vertebral arteries. CTA HEAD: 1. No emergent large vessel occlusion or flow-limiting stenosis. 2. Mild cerebral artery atherosclerosis.  Aortic Atherosclerosis (ICD10-I70.0). Electronically Signed   By: Elon Alas M.D.   On: 09/14/2017 18:10   Mr Brain Wo  Contrast  Result Date: 09/14/2017 CLINICAL DATA:  84 y/o M; sudden onset diplopia, intermittent double vision, and dizziness for the past 2 hours. EXAM: MRI HEAD WITHOUT CONTRAST TECHNIQUE: Multiplanar, multiecho pulse sequences of the brain and surrounding structures were obtained without intravenous contrast. COMPARISON:  09/14/2017 CT head and CTA head FINDINGS: Brain: Artifact from cochlear implant completely obscures the diffusion weighted and gradient echo sequences. On the axial T2 weighted sequence the right hemisphere is partially obscured by artifact from the cochlear implant. There are nonspecific foci of T2 hyperintensity in subcortical and periventricular white matter compatible with mild chronic microvascular ischemic changes for age and mild volume loss of the brain. No extra-axial collection, hydrocephalus, or herniation is identified. Vascular: Normal flow voids. Skull and upper cervical spine: Normal marrow signal. Sinuses/Orbits: Right mastoid effusion. No abnormal signal of mastoid air cells. Bilateral intra-ocular lens replacement. Other: None. IMPRESSION: 1. Extensive artifact from the cochlear implant degrades diffusion and blood sensitive sequences. 2. Stable chronic microvascular ischemic changes and volume loss of the brain given differences in technique. 3. Right mastoid effusion. Electronically Signed   By: Kristine Garbe M.D.   On: 09/14/2017 22:43     Management plans discussed with the patient, family and they are in agreement.  CODE STATUS:     Code Status Orders  (From admission, onward)        Start     Ordered   09/14/17 1533  Full code  Continuous     09/14/17 1532    Code Status History    Date Active Date Inactive Code Status Order ID Comments User Context   02/15/2017 1208 02/15/2017 1338 Full Code [id number]  Vicie Mutters, MD Inpatient    Advance Directive Documentation     Most Recent Value  Type of Advance Directive  Healthcare Power of  [place], Living will  Pre-existing out of facility DNR order (yellow form or pink MOST form)  -  "MOST" Form in Place?  -      TOTAL TIME TAKING CARE OF THIS PATIENT: *40* minutes.    Fritzi Mandes M.D on 09/15/2017 at 10:17 AM  Between 7am to 6pm - Pager - [phone] After 6pm go to [email or web address] - password [email or web address]  Office  [phone]  CC: Primary care physician; [name], MD

## 2017-09-17 ENCOUNTER — Other Ambulatory Visit: Payer: Self-pay

## 2017-09-17 ENCOUNTER — Emergency Department
Admission: EM | Admit: 2017-09-17 | Discharge: 2017-09-17 | Disposition: A | Discharge status: Home / Self Care | Payer: Medicare Other | Attending: Student in an Organized Health Care Education/Training Program | Admitting: Student in an Organized Health Care Education/Training Program

## 2017-09-17 ENCOUNTER — Emergency Department: Payer: Medicare Other

## 2017-09-17 DIAGNOSIS — Z7982 Long term (current) use of aspirin: Secondary | ICD-10-CM | POA: Diagnosis not present

## 2017-09-17 DIAGNOSIS — N183 Chronic kidney disease, stage 3 (moderate): Secondary | ICD-10-CM | POA: Insufficient documentation

## 2017-09-17 DIAGNOSIS — Z87891 Personal history of nicotine dependence: Secondary | ICD-10-CM | POA: Diagnosis not present

## 2017-09-17 DIAGNOSIS — I129 Hypertensive chronic kidney disease with stage 1 through stage 4 chronic kidney disease, or unspecified chronic kidney disease: Secondary | ICD-10-CM | POA: Diagnosis not present

## 2017-09-17 DIAGNOSIS — Z79899 Other long term (current) drug therapy: Secondary | ICD-10-CM | POA: Diagnosis not present

## 2017-09-17 DIAGNOSIS — H532 Diplopia: Secondary | ICD-10-CM | POA: Diagnosis not present

## 2017-09-17 LAB — COMPREHENSIVE METABOLIC PANEL
ALT: 20 U/L (ref 0–44)
AST: 30 U/L (ref 15–41)
Albumin: 4.1 g/dL (ref 3.5–5.0)
Alkaline Phosphatase: 75 U/L (ref 38–126)
Anion gap: 6 (ref 5–15)
BUN: 33 mg/dL — ABNORMAL HIGH (ref 8–23)
CO2: 30 mmol/L (ref 22–32)
Calcium: 9.3 mg/dL (ref 8.9–10.3)
Chloride: 105 mmol/L (ref 98–111)
Creatinine, Ser: 1.86 mg/dL — ABNORMAL HIGH (ref 0.61–1.24)
GFR calc Af Amer: 35 mL/min — ABNORMAL LOW (ref >60)
GFR calc non Af Amer: 30 mL/min — ABNORMAL LOW (ref >60)
Glucose, Bld: 111 mg/dL — ABNORMAL HIGH (ref 70–99)
Potassium: 4.8 mmol/L (ref 3.5–5.1)
Sodium: 141 mmol/L (ref 135–145)
Total Bilirubin: 0.8 mg/dL (ref 0.3–1.2)
Total Protein: 6.6 g/dL (ref 6.5–8.1)

## 2017-09-17 LAB — URINALYSIS, COMPLETE (UACMP) WITH MICROSCOPIC
Bacteria, UA: NONE SEEN
Bilirubin Urine: NEGATIVE
Glucose, UA: NEGATIVE mg/dL
Hgb urine dipstick: NEGATIVE
Ketones, ur: NEGATIVE mg/dL
Leukocytes, UA: NEGATIVE
Nitrite: NEGATIVE
Protein, ur: NEGATIVE mg/dL
Specific Gravity, Urine: 1.017 (ref 1.005–1.030)
pH: 6 (ref 5.0–8.0)

## 2017-09-17 LAB — CBC WITH DIFFERENTIAL/PLATELET
Basophils Absolute: 0 10*3/uL (ref 0–0.1)
Basophils Relative: 0 %
Eosinophils Absolute: 0.1 10*3/uL (ref 0–0.7)
Eosinophils Relative: 2 %
HCT: 38.7 % — ABNORMAL LOW (ref 40.0–52.0)
Hemoglobin: 13.5 g/dL (ref 13.0–18.0)
Lymphocytes Relative: 31 %
Lymphs Abs: 1.7 10*3/uL (ref 1.0–3.6)
MCH: 31.7 pg (ref 26.0–34.0)
MCHC: 34.8 g/dL (ref 32.0–36.0)
MCV: 91 fL (ref 80.0–100.0)
Monocytes Absolute: 0.5 10*3/uL (ref 0.2–1.0)
Monocytes Relative: 8 %
Neutro Abs: 3.3 10*3/uL (ref 1.4–6.5)
Neutrophils Relative %: 59 %
Platelets: 159 10*3/uL (ref 150–440)
RBC: 4.25 MIL/uL — ABNORMAL LOW (ref 4.40–5.90)
RDW: 14.7 % — ABNORMAL HIGH (ref 11.5–14.5)
WBC: 5.7 10*3/uL (ref 3.8–10.6)

## 2017-09-17 LAB — GLUCOSE, CAPILLARY: Glucose-Capillary: 91 mg/dL (ref 70–99)

## 2017-09-17 NOTE — ED Notes (Signed)
Patient transported to CT 

## 2017-09-17 NOTE — ED Notes (Signed)
ED Provider at bedside. 

## 2017-09-17 NOTE — ED Triage Notes (Signed)
Patient reports noticed double vision this morning.  States recent admission for the same and diagnosed with mini stroke.  Denies any type of weakness.

## 2017-09-17 NOTE — ED Provider Notes (Signed)
[facility] Emergency Department Provider Note    First MD Initiated Contact with Patient 09/17/17 [phone]     (approximate)  I have reviewed the triage vital signs and the nursing notes.   HISTORY  Chief Complaint Visual Field Change    HPI Stephen Garrett is a 84 y.o. male with recent admit for TIA workup for diplopia.  Patient states he woke up this morning and was seeing double again.  Lasted roughly 1 hour.  States that they have since improved.  Denies any nausea.  Denies any chest pain or shortness of breath.  Patient is requesting discharge home.  Patient was originally sent over here by ophthalmology due to concern for central diplopia.  He states that the diplopia only occurs when both eyes are open.  No monocular diplopia.  He denies any visual field cuts.  No headache.    Past Medical History:  Diagnosis Date  . Actinic keratosis   . BPH (benign prostatic hypertrophy)   . Diseases of lips   . Diverticulosis of colon 06/2003  . ED (erectile dysfunction)   . GERD (gastroesophageal reflux disease)   . Heart murmur    as child  . HLD (hyperlipidemia)   . HOH (hard of hearing)   . HTN (hypertension)   . Osteoarthritis of knee    severe, bilat  . Renal insufficiency   . Restless legs syndrome (RLS)   . Sleep disturbance, unspecified   . Spinal stenosis, lumbar region, without neurogenic claudication    Family History  Problem Relation Age of Onset  . Other Father        "clogged arteries"  . Hypertension Mother    Past Surgical History:  Procedure Laterality Date  . CATARACT EXTRACTION, BILATERAL  2011   Dr. Thomasene Ripple  . COCHLEAR IMPLANT Right 02/15/2017   Procedure: RIGHT COCHLEAR IMPLANT;  Surgeon: Vicie Mutters, MD;  Location: [facility];  Service: ENT;  Laterality: Right;  . FINGER SURGERY     Right 4th finger  . [facility]  10/2009   Dr. Bary Castilla  . LUMBAR SPINE SURGERY  06/2005   stenosis repair  . TONSILLECTOMY   childhood  . TOTAL KNEE ARTHROPLASTY  06/2008   bilat (Dr. Marry Guan)   Patient Active Problem List   Diagnosis Date Noted  . TIA (transient ischemic attack) 09/14/2017  . Fatigue 08/04/2017  . General weakness 08/04/2017  . Retinal vein occlusion, central 02/19/2014  . Chronic venous insufficiency 02/19/2014  . Episodic mood disorder ([place]) 12/20/2013  . Advanced directives, counseling/discussion 12/20/2013  . Routine general medical examination at a health care facility 11/02/2012  . ACTINIC KERATOSIS 11/18/2009  . Chronic kidney disease, stage III (moderate) ([place]) 01/26/2009  . Restless legs syndrome 10/03/2008  . OSTEOARTHRITIS 12/12/2006  . SPINAL STENOSIS, LUMBAR 06/24/2006  . Essential hypertension, benign 06/21/2006  . GERD 06/21/2006  . DIVERTICULOSIS, COLON 06/21/2006  . BPH with obstruction/lower urinary tract symptoms 06/21/2006      Prior to Admission medications   Medication Sig Start Date End Date Taking? Authorizing Provider  amLODipine (NORVASC) 5 MG tablet Take 1 tablet (5 mg total) by mouth daily. 08/17/16   [name], MD  aspirin EC 81 MG EC tablet Take 1 tablet (81 mg total) by mouth daily. 09/16/17   Fritzi Mandes, MD  buPROPion (WELLBUTRIN XL) 150 MG 24 hr tablet TAKE ONE TABLET BY MOUTH EVERY DAY 04/10/17   [name], MD  clonazePAM (KLONOPIN) 0.5 MG  tablet TAKE 1 OR 2 TABLETS BY MOUTH AT BEDTIME 06/28/17   [name], MD  finasteride (PROSCAR) 5 MG tablet TAKE ONE TABLET BY MOUTH EVERY DAY 05/29/17   [name], MD  losartan-hydrochlorothiazide (HYZAAR) 100-25 MG tablet TAKE ONE TABLET BY MOUTH EVERY DAY 08/23/17   [name], MD  Multiple Vitamin (MULTIVITAMIN) capsule Take 1 capsule by mouth daily.      [provider]  omeprazole (PRILOSEC) 40 MG capsule TAKE ONE CAPSULE BY MOUTH EVERY DAY 05/15/17   Viviana Simpler I, MD  pramipexole (MIRAPEX) 1 MG tablet TAKE 1 OR 2 TABLETS AT BEDTIME AS NEEDED 01/23/17   [name], MD  pyridoxine (B-6) 100 MG tablet Take 100 mg by mouth daily.    [provider]  tamsulosin (FLOMAX) 0.4 MG CAPS capsule TAKE ONE CAPSULE BY MOUTH EVERY DAY 08/23/17   [name], MD  citalopram (CELEXA) 10 MG tablet Take 1 tablet (10 mg total) by mouth daily. 04/22/11 05/18/11  [name], MD    Allergies Atorvastatin; Pravastatin sodium; and Statins    Social History Social History   Tobacco Use  . Smoking status: Former Smoker    Packs/day: 0.75    Years: 30.00    Pack years: 22.50    Last attempt to quit: 02/28/1970    Years since quitting: 47.5  . Smokeless tobacco: Never Used  Substance Use Topics  . Alcohol use: Yes    Alcohol/week: 0.0 oz    Comment: 1-2 glasses wine/daily  . Drug use: No    Review of Systems Patient denies headaches, rhinorrhea, blurry vision, numbness, shortness of breath, chest pain, edema, cough, abdominal pain, nausea, vomiting, diarrhea, dysuria, fevers, rashes or hallucinations unless otherwise stated above in HPI. ____________________________________________   PHYSICAL EXAM:  VITAL SIGNS: Vitals:   09/17/17 0709 09/17/17 0710  BP: (!) 157/74   Pulse:    Resp:  18  Temp:    SpO2:      Constitutional: Alert and oriented.  Eyes: Conjunctivae are normal.  Head: Atraumatic. Nose: No congestion/rhinnorhea. Mouth/Throat: Mucous membranes are moist.   Neck: No stridor. Painless ROM.  Cardiovascular: Normal rate, regular rhythm. Grossly normal heart sounds.  Good peripheral circulation. Respiratory: Normal respiratory effort.  No retractions. Lungs CTAB. Gastrointestinal: Soft and nontender. No distention. No abdominal bruits. No CVA tenderness. Genitourinary: deferred Musculoskeletal: No lower extremity tenderness nor edema.  No joint effusions. Neurologic:  CN- intact.  No facial droop, Normal FNF.  Normal heel to shin.  Sensation intact bilaterally. Normal speech and language. No gross focal neurologic deficits  are appreciated. No gait instability. Skin:  Skin is warm, dry and intact. No rash noted. Psychiatric: Mood and affect are normal. Speech and behavior are normal.  ____________________________________________   LABS (all labs ordered are listed, but only abnormal results are displayed)  Results for orders placed or performed during the hospital encounter of 09/17/17 (from the past 24 hour(s))  Glucose, capillary     Status: None   Collection Time: 09/17/17  7:13 AM  Result Value Ref Range   Glucose-Capillary 91 70 - 99 mg/dL  CBC with Differential/Platelet     Status: Abnormal   Collection Time: 09/17/17  7:18 AM  Result Value Ref Range   WBC 5.7 3.8 - 10.6 K/uL   RBC 4.25 (L) 4.40 - 5.90 MIL/uL   Hemoglobin 13.5 13.0 - 18.0 g/dL   HCT 38.7 (L) 40.0 - 52.0 %   MCV  91.0 80.0 - 100.0 fL   MCH 31.7 26.0 - 34.0 pg   MCHC 34.8 32.0 - 36.0 g/dL   RDW 14.7 (H) 11.5 - 14.5 %   Platelets 159 150 - 440 K/uL   Neutrophils Relative % 59 %   Neutro Abs 3.3 1.4 - 6.5 K/uL   Lymphocytes Relative 31 %   Lymphs Abs 1.7 1.0 - 3.6 K/uL   Monocytes Relative 8 %   Monocytes Absolute 0.5 0.2 - 1.0 K/uL   Eosinophils Relative 2 %   Eosinophils Absolute 0.1 0 - 0.7 K/uL   Basophils Relative 0 %   Basophils Absolute 0.0 0 - 0.1 K/uL  Comprehensive metabolic panel     Status: Abnormal   Collection Time: 09/17/17  7:18 AM  Result Value Ref Range   Sodium 141 135 - 145 mmol/L   Potassium 4.8 3.5 - 5.1 mmol/L   Chloride 105 98 - 111 mmol/L   CO2 30 22 - 32 mmol/L   Glucose, Bld 111 (H) 70 - 99 mg/dL   BUN 33 (H) 8 - 23 mg/dL   Creatinine, Ser 1.86 (H) 0.61 - 1.24 mg/dL   Calcium 9.3 8.9 - 10.3 mg/dL   Total Protein 6.6 6.5 - 8.1 g/dL   Albumin 4.1 3.5 - 5.0 g/dL   AST 30 15 - 41 U/L   ALT 20 0 - 44 U/L   Alkaline Phosphatase 75 38 - 126 U/L   Total Bilirubin 0.8 0.3 - 1.2 mg/dL   GFR calc non Af Amer 30 (L) >60 mL/min   GFR calc Af Amer 35 (L) >60 mL/min   Anion gap 6 5 - 15  Urinalysis,  Complete w Microscopic     Status: Abnormal   Collection Time: 09/17/17  7:18 AM  Result Value Ref Range   Color, Urine YELLOW (A) YELLOW   APPearance CLEAR (A) CLEAR   Specific Gravity, Urine 1.017 1.005 - 1.030   pH 6.0 5.0 - 8.0   Glucose, UA NEGATIVE NEGATIVE mg/dL   Hgb urine dipstick NEGATIVE NEGATIVE   Bilirubin Urine NEGATIVE NEGATIVE   Ketones, ur NEGATIVE NEGATIVE mg/dL   Protein, ur NEGATIVE NEGATIVE mg/dL   Nitrite NEGATIVE NEGATIVE   Leukocytes, UA NEGATIVE NEGATIVE   RBC / HPF 0-5 0 - 5 RBC/hpf   WBC, UA 0-5 0 - 5 WBC/hpf   Bacteria, UA NONE SEEN NONE SEEN   Squamous Epithelial / LPF 0-5 0 - 5   Mucus PRESENT    ____________________________________________  EKG My review and personal interpretation at Time: 7:17   Indication: diplopia  Rate: 65  Rhythm: sinus Axis: normal Other: normal intervals, occasional pvc,  ____________________________________________  RADIOLOGY  I personally reviewed all radiographic images ordered to evaluate for the above acute complaints and reviewed radiology reports and findings.  These findings were personally discussed with the patient.  Please see medical record for radiology report.  ____________________________________________   PROCEDURES  Procedure(s) performed:  Procedures    Critical Care performed: no ____________________________________________   INITIAL IMPRESSION / ASSESSMENT AND PLAN / ED COURSE  Pertinent labs & imaging results that were available during my care of the patient were reviewed by me and considered in my medical decision making (see chart for details).   DDX: peripheral diplopia, tia, cva, vertigo, electrolyte abn  Stephen Garrett is a 58 y.o. who presents to the ED with brief episode of diplopia.  Patient with extensive work-up in the hospital last week for similar symptoms.  Symptoms recurred today.  He is asymptomatic at this time is afebrile and hemodynamically stable.  Neuro exam is  nonfocal.  Possible TIA.  Symptoms seem minor.  Will check blood work as well as repeat CT head.  Discussed options for readmission to the hospital for additional observation including evaluation with the patient's having some form of subclinical seizures though I will seems a lot less likely.  Patient states he would certainly prefer to be discharged home if at all possible.  Recommended blood work and CT imaging and brief observation in the ER will point will make a decision as to whether this patient can have appropriate outpatient follow-up.  Clinical Course as of Sep 18 819  Sun Sep 17, 2017  0818 Patient reassessed.  Repeat neuro exam nonfocal.  CT imaging reassuring.  At this point do not feel that repeat MRI of further diagnostic testing or admission the hospital clinically indicated.  Have instructed patient to follow-up with ophthalmology as well as his ear nose and throat doctor as he may be having some complication from cochlear implant though seems a lot less likely.  Symptoms seem to be brief and mild at this time.  Patient agrees with plan for discharge home.  Have discussed with the patient and available family all diagnostics and treatments performed thus far and all questions were answered to the best of my ability. The patient demonstrates understanding and agreement with plan.    [PR]    Clinical Course User Index [PR] Merlyn Lot, MD     As part of my medical decision making, I reviewed the following data within the [place] notes reviewed and incorporated, Labs reviewed, notes from prior ED visits.   ____________________________________________   FINAL CLINICAL IMPRESSION(S) / ED DIAGNOSES  Final diagnoses:  Diplopia      NEW MEDICATIONS STARTED DURING THIS VISIT:  New Prescriptions   No medications on file     Note:  This document was prepared using Dragon voice recognition software and may include unintentional dictation  errors.    Merlyn Lot, MD 09/17/17 [phone]

## 2017-09-17 NOTE — ED Notes (Signed)
Pt alert and oriented X4, active, cooperative, pt in NAD. RR even and unlabored, color WNL.  Pt informed to return if any life threatening symptoms occur.  Discharge and followup instructions reviewed.  

## 2017-09-18 ENCOUNTER — Telehealth: Payer: Self-pay | Admitting: *Deleted

## 2017-09-18 ENCOUNTER — Other Ambulatory Visit
Admission: RE | Admit: 2017-09-18 | Discharge: 2017-09-18 | Disposition: A | Discharge status: Home / Self Care | Payer: Medicare Other | Source: Ambulatory Visit | Attending: Ophthalmology | Admitting: Ophthalmology

## 2017-09-18 DIAGNOSIS — H02403 Unspecified ptosis of bilateral eyelids: Secondary | ICD-10-CM | POA: Insufficient documentation

## 2017-09-18 DIAGNOSIS — H532 Diplopia: Secondary | ICD-10-CM | POA: Diagnosis not present

## 2017-09-18 DIAGNOSIS — H538 Other visual disturbances: Secondary | ICD-10-CM | POA: Diagnosis not present

## 2017-09-18 LAB — SEDIMENTATION RATE: Sed Rate: 11 mm/hr (ref 0–20)

## 2017-09-18 NOTE — Telephone Encounter (Signed)
Attempted to contact pt to complete TCM; unable to leave message

## 2017-09-20 DIAGNOSIS — H532 Diplopia: Secondary | ICD-10-CM | POA: Diagnosis not present

## 2017-09-20 NOTE — Telephone Encounter (Signed)
° ° °  Pt called and wanted Stephen Garrett to know that Dr Noemi Chapel will not be doing the eye surgery as he stated ,said the doctor is not sure what is going on

## 2017-09-20 NOTE — Telephone Encounter (Signed)
Spoke with patient at length today regarding his follow up.  Patient has been back in the ER since initial discharge on the 18th.  He states he has been diagnosed with " muscle problem behind his eye and will need brain surgery ".  He is seeing Dr. Sandra Cockayne at [facility] center this afternoon to evaluate further.  He has appointment with Dr. Silvio Pate on 09/25/17 and will keep [place] updated as to what is going on.  Otherwise, he is doing well right now and does not require any additional support or assistance.

## 2017-09-21 NOTE — Telephone Encounter (Signed)
I don't think any surgery would be necessary if there is an eye muscle problem, and if so it wouldn't be in the brain (??cranial nerve issue vs muscle). Will await further word from him or Dr Thomasene Ripple

## 2017-09-25 ENCOUNTER — Ambulatory Visit (INDEPENDENT_AMBULATORY_CARE_PROVIDER_SITE_OTHER): Payer: Medicare Other | Admitting: Internal Medicine

## 2017-09-25 ENCOUNTER — Encounter: Payer: Self-pay | Admitting: Internal Medicine

## 2017-09-25 VITALS — BP 124/78 | HR 72 | Ht 65.5 in | Wt 181.0 lb

## 2017-09-25 DIAGNOSIS — H903 Sensorineural hearing loss, bilateral: Secondary | ICD-10-CM | POA: Diagnosis not present

## 2017-09-25 DIAGNOSIS — H532 Diplopia: Secondary | ICD-10-CM | POA: Diagnosis not present

## 2017-09-25 NOTE — Assessment & Plan Note (Signed)
MRI and CT scans don't show central etiology Unclear if this could be cranial nerve related or eye muscle weakness Is on ASA just in case this is vascular (but doesn't seem to be) Going back to Dr Dingledein--considering referral to [facility] (neuro-ophthamology?)

## 2017-09-25 NOTE — Progress Notes (Signed)
Subjective:    Patient ID: Stephen Garrett, male    DOB: Dec 08, 1923, 84 y.o.   MRN: [id number]  HPI Here for hospital follow up  Sudden diplopia (one on top of other) while playing pool 7/18 Went to Dr Thomasene Ripple Sent to [facility]--finally resolved a while later (??hours) Work up negative  Then diplopia recurred --but horizontal  To ER--- CT unchanged Resolved and it went away  Reviewed records and radiologic reports  Still having double vision 1/2 hour a day Never occurs with just one eye  Dr Thomasene Ripple thought there might be eye muscle problem--but not sure May refer to [facility]  Current Outpatient Medications on File Prior to Visit  Medication Sig Dispense Refill  . amLODipine (NORVASC) 5 MG tablet Take 1 tablet (5 mg total) by mouth daily. 90 tablet 3  . aspirin EC 81 MG EC tablet Take 1 tablet (81 mg total) by mouth daily. 30 tablet 1  . buPROPion (WELLBUTRIN XL) 150 MG 24 hr tablet TAKE ONE TABLET BY MOUTH EVERY DAY 90 tablet 3  . clonazePAM (KLONOPIN) 0.5 MG tablet TAKE 1 OR 2 TABLETS BY MOUTH AT BEDTIME 180 tablet 0  . finasteride (PROSCAR) 5 MG tablet TAKE ONE TABLET BY MOUTH EVERY DAY 90 tablet 3  . losartan-hydrochlorothiazide (HYZAAR) 100-25 MG tablet TAKE ONE TABLET BY MOUTH EVERY DAY 90 tablet 3  . Multiple Vitamin (MULTIVITAMIN) capsule Take 1 capsule by mouth daily.      Marland Kitchen omeprazole (PRILOSEC) 40 MG capsule TAKE ONE CAPSULE BY MOUTH EVERY DAY 90 capsule 3  . pramipexole (MIRAPEX) 1 MG tablet TAKE 1 OR 2 TABLETS AT BEDTIME AS NEEDED 180 tablet 3  . pyridoxine (B-6) 100 MG tablet Take 100 mg by mouth daily.    . tamsulosin (FLOMAX) 0.4 MG CAPS capsule TAKE ONE CAPSULE BY MOUTH EVERY DAY 90 capsule 3  . [DISCONTINUED] citalopram (CELEXA) 10 MG tablet Take 1 tablet (10 mg total) by mouth daily. 30 tablet 5   No current facility-administered medications on file prior to visit.     Allergies  Allergen Reactions  . Atorvastatin     REACTION: raises blood pressure  .  Pravastatin Sodium     REACTION: raises blood pressure  . Statins     REACTION: raises bp    Past Medical History:  Diagnosis Date  . Actinic keratosis   . BPH (benign prostatic hypertrophy)   . Diseases of lips   . Diverticulosis of colon 06/2003  . ED (erectile dysfunction)   . GERD (gastroesophageal reflux disease)   . Heart murmur    as child  . HLD (hyperlipidemia)   . HOH (hard of hearing)   . HTN (hypertension)   . Osteoarthritis of knee    severe, bilat  . Renal insufficiency   . Restless legs syndrome (RLS)   . Sleep disturbance, unspecified   . Spinal stenosis, lumbar region, without neurogenic claudication     Past Surgical History:  Procedure Laterality Date  . CATARACT EXTRACTION, BILATERAL  2011   Dr. Thomasene Ripple  . COCHLEAR IMPLANT Right 02/15/2017   Procedure: RIGHT COCHLEAR IMPLANT;  Surgeon: Vicie Mutters, MD;  Location: [facility];  Service: ENT;  Laterality: Right;  . FINGER SURGERY     Right 4th finger  . [facility]  10/2009   Dr. Bary Castilla  . LUMBAR SPINE SURGERY  06/2005   stenosis repair  . TONSILLECTOMY  childhood  . TOTAL KNEE ARTHROPLASTY  06/2008   bilat (Dr.  Hooten)    Family History  Problem Relation Age of Onset  . Other Father        "clogged arteries"  . Hypertension Mother     Social History   Socioeconomic History  . Marital status: Widowed    Spouse name: Not on file  . Number of children: 2  . Years of education: Not on file  . Highest education level: Not on file  Occupational History  . Occupation: Retired  Scientific laboratory technician  . Financial resource strain: Not on file  . Food insecurity:    Worry: Not on file    Inability: Not on file  . Transportation needs:    Medical: Not on file    Non-medical: Not on file  Tobacco Use  . Smoking status: Former Smoker    Packs/day: 0.75    Years: 30.00    Pack years: 22.50    Last attempt to quit: 02/28/1970    Years since quitting: 47.6  . Smokeless tobacco: Never Used    Substance and Sexual Activity  . Alcohol use: Yes    Alcohol/week: 0.0 oz    Comment: 1-2 glasses wine/daily  . Drug use: No  . Sexual activity: Not on file  Lifestyle  . Physical activity:    Days per week: Not on file    Minutes per session: Not on file  . Stress: Not on file  Relationships  . Social connections:    Talks on phone: Not on file    Gets together: Not on file    Attends religious service: Not on file    Active member of club or organization: Not on file    Attends meetings of clubs or organizations: Not on file    Relationship status: Not on file  . Intimate partner violence:    Fear of current or ex partner: Not on file    Emotionally abused: Not on file    Physically abused: Not on file    Forced sexual activity: Not on file  Other Topics Concern  . Not on file  Social History Narrative   Widowed 2010   2 adopted children   Has living will   Son is health care power of attorney   Would accept resuscitation but no prolonged ventilation   No tube feeds if cognitively unaware   Review of Systems No fever Not sick Appetite remains fine    Objective:   Physical Exam  Constitutional: He appears well-developed. No distress.  Eyes:  Equal miotic pupils Pupils centered but very brief right exophoria Did have brief diplopia (horizontal) but gone within seconds EOM full           Assessment & Plan:

## 2017-09-27 ENCOUNTER — Emergency Department: Payer: Medicare Other

## 2017-09-27 ENCOUNTER — Inpatient Hospital Stay
Admission: EM | Admit: 2017-09-27 | Discharge: 2017-09-29 | DRG: 871 | Disposition: A | Discharge status: Home / Self Care | Payer: Medicare Other | Attending: Internal Medicine | Admitting: Internal Medicine

## 2017-09-27 ENCOUNTER — Other Ambulatory Visit: Payer: Self-pay

## 2017-09-27 ENCOUNTER — Encounter: Payer: Self-pay | Admitting: Emergency Medicine

## 2017-09-27 DIAGNOSIS — Z9842 Cataract extraction status, left eye: Secondary | ICD-10-CM

## 2017-09-27 DIAGNOSIS — I129 Hypertensive chronic kidney disease with stage 1 through stage 4 chronic kidney disease, or unspecified chronic kidney disease: Secondary | ICD-10-CM | POA: Diagnosis present

## 2017-09-27 DIAGNOSIS — A419 Sepsis, unspecified organism: Secondary | ICD-10-CM | POA: Diagnosis not present

## 2017-09-27 DIAGNOSIS — M170 Bilateral primary osteoarthritis of knee: Secondary | ICD-10-CM | POA: Diagnosis present

## 2017-09-27 DIAGNOSIS — N529 Male erectile dysfunction, unspecified: Secondary | ICD-10-CM | POA: Diagnosis present

## 2017-09-27 DIAGNOSIS — K219 Gastro-esophageal reflux disease without esophagitis: Secondary | ICD-10-CM | POA: Diagnosis present

## 2017-09-27 DIAGNOSIS — E785 Hyperlipidemia, unspecified: Secondary | ICD-10-CM | POA: Diagnosis present

## 2017-09-27 DIAGNOSIS — Z8673 Personal history of transient ischemic attack (TIA), and cerebral infarction without residual deficits: Secondary | ICD-10-CM | POA: Diagnosis not present

## 2017-09-27 DIAGNOSIS — Z79899 Other long term (current) drug therapy: Secondary | ICD-10-CM | POA: Diagnosis not present

## 2017-09-27 DIAGNOSIS — R0602 Shortness of breath: Secondary | ICD-10-CM | POA: Diagnosis not present

## 2017-09-27 DIAGNOSIS — G2581 Restless legs syndrome: Secondary | ICD-10-CM | POA: Diagnosis present

## 2017-09-27 DIAGNOSIS — J189 Pneumonia, unspecified organism: Secondary | ICD-10-CM | POA: Diagnosis present

## 2017-09-27 DIAGNOSIS — Z888 Allergy status to other drugs, medicaments and biological substances status: Secondary | ICD-10-CM | POA: Diagnosis not present

## 2017-09-27 DIAGNOSIS — Z8249 Family history of ischemic heart disease and other diseases of the circulatory system: Secondary | ICD-10-CM

## 2017-09-27 DIAGNOSIS — Z87891 Personal history of nicotine dependence: Secondary | ICD-10-CM

## 2017-09-27 DIAGNOSIS — J181 Lobar pneumonia, unspecified organism: Secondary | ICD-10-CM | POA: Diagnosis present

## 2017-09-27 DIAGNOSIS — H9190 Unspecified hearing loss, unspecified ear: Secondary | ICD-10-CM | POA: Diagnosis present

## 2017-09-27 DIAGNOSIS — Y95 Nosocomial condition: Secondary | ICD-10-CM | POA: Diagnosis present

## 2017-09-27 DIAGNOSIS — R0902 Hypoxemia: Secondary | ICD-10-CM | POA: Diagnosis present

## 2017-09-27 DIAGNOSIS — I10 Essential (primary) hypertension: Secondary | ICD-10-CM | POA: Diagnosis not present

## 2017-09-27 DIAGNOSIS — Z9841 Cataract extraction status, right eye: Secondary | ICD-10-CM

## 2017-09-27 DIAGNOSIS — N400 Enlarged prostate without lower urinary tract symptoms: Secondary | ICD-10-CM | POA: Diagnosis present

## 2017-09-27 DIAGNOSIS — Z7982 Long term (current) use of aspirin: Secondary | ICD-10-CM | POA: Diagnosis not present

## 2017-09-27 DIAGNOSIS — L570 Actinic keratosis: Secondary | ICD-10-CM | POA: Diagnosis present

## 2017-09-27 DIAGNOSIS — N183 Chronic kidney disease, stage 3 (moderate): Secondary | ICD-10-CM | POA: Diagnosis present

## 2017-09-27 LAB — LACTIC ACID, PLASMA
Lactic Acid, Venous: 2.3 mmol/L (ref 0.5–1.9)
Lactic Acid, Venous: 1.8 mmol/L (ref 0.5–1.9)

## 2017-09-27 LAB — COMPREHENSIVE METABOLIC PANEL
ALT: 19 U/L (ref 0–44)
AST: 31 U/L (ref 15–41)
Albumin: 4.3 g/dL (ref 3.5–5.0)
Alkaline Phosphatase: 94 U/L (ref 38–126)
Anion gap: 10 (ref 5–15)
BUN: 32 mg/dL — ABNORMAL HIGH (ref 8–23)
CO2: 26 mmol/L (ref 22–32)
Calcium: 9.4 mg/dL (ref 8.9–10.3)
Chloride: 105 mmol/L (ref 98–111)
Creatinine, Ser: 1.48 mg/dL — ABNORMAL HIGH (ref 0.61–1.24)
GFR calc Af Amer: 46 mL/min — ABNORMAL LOW (ref >60)
GFR calc non Af Amer: 39 mL/min — ABNORMAL LOW (ref >60)
Glucose, Bld: 118 mg/dL — ABNORMAL HIGH (ref 70–99)
Potassium: 3.8 mmol/L (ref 3.5–5.1)
Sodium: 141 mmol/L (ref 135–145)
Total Bilirubin: 0.9 mg/dL (ref 0.3–1.2)
Total Protein: 7.2 g/dL (ref 6.5–8.1)

## 2017-09-27 LAB — BLOOD GAS, VENOUS
Acid-Base Excess: 3.6 mmol/L — ABNORMAL HIGH (ref 0.0–2.0)
Bicarbonate: 30.1 mmol/L — ABNORMAL HIGH (ref 20.0–28.0)
O2 Saturation: UNDETERMINED %
Patient temperature: 37
pCO2, Ven: 52 mmHg (ref 44.0–60.0)
pH, Ven: 7.37 (ref 7.250–7.430)

## 2017-09-27 LAB — CBC WITH DIFFERENTIAL/PLATELET
Basophils Absolute: 0 10*3/uL (ref 0–0.1)
Basophils Relative: 0 %
Eosinophils Absolute: 0.1 10*3/uL (ref 0–0.7)
Eosinophils Relative: 1 %
HCT: 43.3 % (ref 40.0–52.0)
Hemoglobin: 14.7 g/dL (ref 13.0–18.0)
Lymphocytes Relative: 15 %
Lymphs Abs: 1.8 10*3/uL (ref 1.0–3.6)
MCH: 31.2 pg (ref 26.0–34.0)
MCHC: 33.9 g/dL (ref 32.0–36.0)
MCV: 91.8 fL (ref 80.0–100.0)
Monocytes Absolute: 0.3 10*3/uL (ref 0.2–1.0)
Monocytes Relative: 3 %
Neutro Abs: 9.5 10*3/uL — ABNORMAL HIGH (ref 1.4–6.5)
Neutrophils Relative %: 81 %
Platelets: 177 10*3/uL (ref 150–440)
RBC: 4.72 MIL/uL (ref 4.40–5.90)
RDW: 14.6 % — ABNORMAL HIGH (ref 11.5–14.5)
WBC: 11.7 10*3/uL — ABNORMAL HIGH (ref 3.8–10.6)

## 2017-09-27 LAB — TROPONIN I: Troponin I: 0.04 ng/mL (ref <0.03)

## 2017-09-27 MED ORDER — LOSARTAN POTASSIUM-HCTZ 100-25 MG PO TABS: 1.0000 | ORAL_TABLET | Freq: Every day | ORAL | Status: DC

## 2017-09-27 MED ORDER — ALBUTEROL SULFATE (2.5 MG/3ML) 0.083% IN NEBU: 2.5000 mg | INHALATION_SOLUTION | RESPIRATORY_TRACT | Status: DC | PRN

## 2017-09-27 MED ORDER — AZITHROMYCIN 500 MG IV SOLR: 500.0000 mg | INTRAVENOUS | Status: DC

## 2017-09-27 MED ORDER — BUPROPION HCL ER (XL) 150 MG PO TB24
150.0000 mg | ORAL_TABLET | Freq: Every day | ORAL | Status: DC
  Administered 2017-09-27 – 2017-09-29 (×3): 150 mg via ORAL
  Filled 2017-09-27 (×3): qty 1

## 2017-09-27 MED ORDER — HYDROCHLOROTHIAZIDE 25 MG PO TABS
25.0000 mg | ORAL_TABLET | Freq: Every day | ORAL | Status: DC
  Administered 2017-09-27 – 2017-09-29 (×3): 25 mg via ORAL
  Filled 2017-09-27 (×3): qty 1

## 2017-09-27 MED ORDER — PRAMIPEXOLE DIHYDROCHLORIDE 1 MG PO TABS
1.0000 mg | ORAL_TABLET | Freq: Every day | ORAL | Status: DC
  Administered 2017-09-27 – 2017-09-28 (×2): 1 mg via ORAL
  Filled 2017-09-27 (×2): qty 1

## 2017-09-27 MED ORDER — SODIUM CHLORIDE 0.9 % IV BOLUS
500.0000 mL | Freq: Once | INTRAVENOUS | Status: AC
Start: 2017-09-27 — End: 2017-09-27
  Administered 2017-09-27: 500 mL via INTRAVENOUS

## 2017-09-27 MED ORDER — LEVOFLOXACIN IN D5W 250 MG/50ML IV SOLN
INTRAVENOUS | Status: AC
  Filled 2017-09-27: qty 50

## 2017-09-27 MED ORDER — SODIUM CHLORIDE 0.9 % IV SOLN
2.0000 g | Freq: Once | INTRAVENOUS | Status: AC
  Administered 2017-09-27: 2 g via INTRAVENOUS

## 2017-09-27 MED ORDER — LEVOFLOXACIN IN D5W 750 MG/150ML IV SOLN
INTRAVENOUS | Status: AC
  Filled 2017-09-27: qty 150

## 2017-09-27 MED ORDER — SODIUM CHLORIDE 0.9 % IV SOLN: 2.0000 g | INTRAVENOUS | Status: DC

## 2017-09-27 MED ORDER — PANTOPRAZOLE SODIUM 40 MG PO TBEC
40.0000 mg | DELAYED_RELEASE_TABLET | Freq: Every day | ORAL | Status: DC
  Administered 2017-09-27 – 2017-09-29 (×3): 40 mg via ORAL
  Filled 2017-09-27 (×3): qty 1

## 2017-09-27 MED ORDER — PRAMIPEXOLE DIHYDROCHLORIDE 1 MG PO TABS
1.0000 mg | ORAL_TABLET | Freq: Every day | ORAL | Status: DC
  Administered 2017-09-27 – 2017-09-28 (×2): 1 mg via ORAL
  Filled 2017-09-27 (×2): qty 1

## 2017-09-27 MED ORDER — SODIUM CHLORIDE 0.9 % IV SOLN
INTRAVENOUS | Status: AC
  Filled 2017-09-27: qty 2

## 2017-09-27 MED ORDER — ASPIRIN EC 81 MG PO TBEC
81.0000 mg | DELAYED_RELEASE_TABLET | Freq: Every day | ORAL | Status: DC
  Administered 2017-09-27 – 2017-09-29 (×3): 81 mg via ORAL
  Filled 2017-09-27 (×3): qty 1

## 2017-09-27 MED ORDER — ACETAMINOPHEN 650 MG RE SUPP: 650.0000 mg | Freq: Four times a day (QID) | RECTAL | Status: DC | PRN

## 2017-09-27 MED ORDER — ENOXAPARIN SODIUM 40 MG/0.4ML ~~LOC~~ SOLN
40.0000 mg | SUBCUTANEOUS | Status: DC
  Administered 2017-09-27 – 2017-09-28 (×2): 40 mg via SUBCUTANEOUS
  Filled 2017-09-27 (×2): qty 0.4

## 2017-09-27 MED ORDER — VANCOMYCIN HCL IN DEXTROSE 1-5 GM/200ML-% IV SOLN
1000.0000 mg | Freq: Once | INTRAVENOUS | Status: DC
  Administered 2017-09-27: 1000 mg via INTRAVENOUS

## 2017-09-27 MED ORDER — IPRATROPIUM-ALBUTEROL 0.5-2.5 (3) MG/3ML IN SOLN
3.0000 mL | Freq: Once | RESPIRATORY_TRACT | Status: AC
  Administered 2017-09-27: 3 mL via RESPIRATORY_TRACT
  Filled 2017-09-27: qty 3

## 2017-09-27 MED ORDER — POLYETHYLENE GLYCOL 3350 17 G PO PACK
17.0000 g | PACK | Freq: Every day | ORAL | Status: DC | PRN
  Administered 2017-09-28: 09:00:00 17 g via ORAL
  Filled 2017-09-27: qty 1

## 2017-09-27 MED ORDER — VANCOMYCIN HCL IN DEXTROSE 1-5 GM/200ML-% IV SOLN
INTRAVENOUS | Status: AC
  Administered 2017-09-27: 1000 mg via INTRAVENOUS
  Filled 2017-09-27: qty 200

## 2017-09-27 MED ORDER — CLONAZEPAM 0.5 MG PO TABS
0.5000 mg | ORAL_TABLET | Freq: Every day | ORAL | Status: DC
  Administered 2017-09-27 – 2017-09-28 (×2): 0.5 mg via ORAL
  Filled 2017-09-27 (×2): qty 1

## 2017-09-27 MED ORDER — PREDNISONE 50 MG PO TABS
50.0000 mg | ORAL_TABLET | Freq: Every day | ORAL | Status: AC
  Administered 2017-09-27 – 2017-09-29 (×3): 50 mg via ORAL
  Filled 2017-09-27 (×3): qty 1

## 2017-09-27 MED ORDER — ACETAMINOPHEN 325 MG PO TABS: 650.0000 mg | ORAL_TABLET | Freq: Four times a day (QID) | ORAL | Status: DC | PRN

## 2017-09-27 MED ORDER — AMLODIPINE BESYLATE 5 MG PO TABS
5.0000 mg | ORAL_TABLET | Freq: Every day | ORAL | Status: DC
  Administered 2017-09-27 – 2017-09-29 (×3): 5 mg via ORAL
  Filled 2017-09-27 (×3): qty 1

## 2017-09-27 MED ORDER — ONDANSETRON HCL 4 MG/2ML IJ SOLN: 4.0000 mg | Freq: Four times a day (QID) | INTRAMUSCULAR | Status: DC | PRN

## 2017-09-27 MED ORDER — HYDRALAZINE HCL 20 MG/ML IJ SOLN: 10.0000 mg | Freq: Four times a day (QID) | INTRAMUSCULAR | Status: DC | PRN

## 2017-09-27 MED ORDER — FINASTERIDE 5 MG PO TABS
5.0000 mg | ORAL_TABLET | Freq: Every day | ORAL | Status: DC
  Administered 2017-09-27 – 2017-09-29 (×3): 5 mg via ORAL
  Filled 2017-09-27 (×3): qty 1

## 2017-09-27 MED ORDER — ONDANSETRON HCL 4 MG PO TABS: 4.0000 mg | ORAL_TABLET | Freq: Four times a day (QID) | ORAL | Status: DC | PRN

## 2017-09-27 MED ORDER — LEVOFLOXACIN IN D5W 250 MG/50ML IV SOLN
250.0000 mg | INTRAVENOUS | Status: DC
  Administered 2017-09-27 – 2017-09-28 (×2): 250 mg via INTRAVENOUS
  Filled 2017-09-27: qty 50

## 2017-09-27 MED ORDER — IPRATROPIUM-ALBUTEROL 0.5-2.5 (3) MG/3ML IN SOLN
3.0000 mL | Freq: Four times a day (QID) | RESPIRATORY_TRACT | Status: AC
  Administered 2017-09-27 – 2017-09-28 (×4): 3 mL via RESPIRATORY_TRACT
  Filled 2017-09-27 (×4): qty 3

## 2017-09-27 MED ORDER — LOSARTAN POTASSIUM 50 MG PO TABS
100.0000 mg | ORAL_TABLET | Freq: Every day | ORAL | Status: DC
  Administered 2017-09-27 – 2017-09-29 (×3): 100 mg via ORAL
  Filled 2017-09-27 (×3): qty 2

## 2017-09-27 MED ORDER — TAMSULOSIN HCL 0.4 MG PO CAPS
0.4000 mg | ORAL_CAPSULE | Freq: Every day | ORAL | Status: DC
  Administered 2017-09-27 – 2017-09-28 (×2): 0.4 mg via ORAL
  Filled 2017-09-27 (×3): qty 1

## 2017-09-27 MED ORDER — SODIUM CHLORIDE 0.9 % IV BOLUS
500.0000 mL | Freq: Once | INTRAVENOUS | Status: AC
  Administered 2017-09-27: 500 mL via INTRAVENOUS

## 2017-09-27 NOTE — Progress Notes (Signed)
Pt requesting that he wants his mirapex ordered like at home, per Dr Jerelyn Charles ok to order mirapex like pt takes at home 1mg  at supper and 1mg  at bedtime

## 2017-09-27 NOTE — H&P (Signed)
Stephen Garrett at [place] NAME: Stephen Garrett    MR#:  [id number]  DATE OF BIRTH:  08/15/1923  DATE OF ADMISSION:  09/27/2017  PRIMARY CARE PHYSICIAN: [name], MD   REQUESTING/REFERRING PHYSICIAN: Dr. Quentin Cornwall  CHIEF COMPLAINT:   Chief Complaint  Patient presents with  . Shortness of Breath    HISTORY OF PRESENT ILLNESS:  Stephen Garrett  is a 85 y.o. male with a known history of HTN, decreased hearing, BPH presents to the hospital with weakness and shortness of breath since waking up today morning.  Patient has past smoking history but is quit for 50 years now.  He continues to stay active.  Plays tennis regularly.  But today he felt extremely weak on waking up.  Afebrile.  Tachypneic and tachycardia. He was given 2 doses of antibiotic prior to dental procedure recently.  Had less than 24 hours stay in the hospital for TIA.  No risk of multidrug-resistant organism infection. Saturations 94% on room air. Patient is being admitted for pneumonia and sepsis present on admission.  PAST MEDICAL HISTORY:   Past Medical History:  Diagnosis Date  . Actinic keratosis   . BPH (benign prostatic hypertrophy)   . Diseases of lips   . Diverticulosis of colon 06/2003  . ED (erectile dysfunction)   . GERD (gastroesophageal reflux disease)   . Heart murmur    as child  . HLD (hyperlipidemia)   . HOH (hard of hearing)   . HTN (hypertension)   . Osteoarthritis of knee    severe, bilat  . Renal insufficiency   . Restless legs syndrome (RLS)   . Sleep disturbance, unspecified   . Spinal stenosis, lumbar region, without neurogenic claudication     PAST SURGICAL HISTORY:   Past Surgical History:  Procedure Laterality Date  . CATARACT EXTRACTION, BILATERAL  2011   Dr. Thomasene Ripple  . COCHLEAR IMPLANT Right 02/15/2017   Procedure: RIGHT COCHLEAR IMPLANT;  Surgeon: Vicie Mutters, MD;  Location: [facility];  Service: ENT;  Laterality:  Right;  . FINGER SURGERY     Right 4th finger  . [facility]  10/2009   Dr. Bary Castilla  . LUMBAR SPINE SURGERY  06/2005   stenosis repair  . TONSILLECTOMY  childhood  . TOTAL KNEE ARTHROPLASTY  06/2008   bilat (Dr. Marry Guan)    SOCIAL HISTORY:   Social History   Tobacco Use  . Smoking status: Former Smoker    Packs/day: 0.75    Years: 30.00    Pack years: 22.50    Last attempt to quit: 02/28/1970    Years since quitting: 47.6  . Smokeless tobacco: Never Used  Substance Use Topics  . Alcohol use: Yes    Alcohol/week: 0.0 oz    Comment: 1-2 glasses wine/daily    FAMILY HISTORY:   Family History  Problem Relation Age of Onset  . Other Father        "clogged arteries"  . Hypertension Mother     DRUG ALLERGIES:   Allergies  Allergen Reactions  . Atorvastatin     REACTION: raises blood pressure  . Pravastatin Sodium     REACTION: raises blood pressure  . Statins     REACTION: raises bp    REVIEW OF SYSTEMS:   Review of Systems  Constitutional: Positive for malaise/fatigue. Negative for chills and fever.  HENT: Negative for sore throat.   Eyes: Negative for blurred vision, double vision and pain.  Respiratory:  Positive for cough, sputum production and shortness of breath. Negative for hemoptysis and wheezing.   Cardiovascular: Negative for chest pain, palpitations, orthopnea and leg swelling.  Gastrointestinal: Negative for abdominal pain, constipation, diarrhea, heartburn, nausea and vomiting.  Genitourinary: Negative for dysuria and hematuria.  Musculoskeletal: Negative for back pain and joint pain.  Skin: Negative for rash.  Neurological: Negative for sensory change, speech change, focal weakness and headaches.  Endo/Heme/Allergies: Does not bruise/bleed easily.  Psychiatric/Behavioral: Negative for depression. The patient is not nervous/anxious.    MEDICATIONS AT HOME:   Prior to Admission medications   Medication Sig Start Date End Date Taking? Authorizing Provider   amLODipine (NORVASC) 5 MG tablet Take 1 tablet (5 mg total) by mouth daily. 08/17/16  Yes Viviana Simpler I, MD  aspirin EC 81 MG EC tablet Take 1 tablet (81 mg total) by mouth daily. 09/16/17  Yes Fritzi Mandes, MD  buPROPion (WELLBUTRIN XL) 150 MG 24 hr tablet TAKE ONE TABLET BY MOUTH EVERY DAY 04/10/17  Yes [name], MD  clonazePAM (KLONOPIN) 0.5 MG tablet TAKE 1 OR 2 TABLETS BY MOUTH AT BEDTIME Patient taking differently: Take 0.5-0.75 mg by mouth at bedtime as needed for sleep. 06/28/17  Yes [name], MD  finasteride (PROSCAR) 5 MG tablet TAKE ONE TABLET BY MOUTH EVERY DAY 05/29/17  Yes [name], MD  losartan-hydrochlorothiazide (HYZAAR) 100-25 MG tablet TAKE ONE TABLET BY MOUTH EVERY DAY 08/23/17  Yes [name], MD  Multiple Vitamin (MULTIVITAMIN) capsule Take 1 capsule by mouth daily.     Yes [provider]  omeprazole (PRILOSEC) 40 MG capsule TAKE ONE CAPSULE BY MOUTH EVERY DAY 05/15/17  Yes [name], MD  pramipexole (MIRAPEX) 1 MG tablet TAKE 1 OR 2 TABLETS AT BEDTIME AS NEEDED Patient taking differently: Take 1 mg by mouth at dinner and 1 mg by mouth at bedtime. 01/23/17  Yes [name], MD  pyridoxine (B-6) 100 MG tablet Take 100 mg by mouth daily.   Yes [provider]  tamsulosin (FLOMAX) 0.4 MG CAPS capsule TAKE ONE CAPSULE BY MOUTH EVERY DAY 08/23/17  Yes [name], MD  citalopram (CELEXA) 10 MG tablet Take 1 tablet (10 mg total) by mouth daily. 04/22/11 05/18/11  [name], MD   VITAL SIGNS:  Blood pressure (!) 170/78, pulse 91, temperature (!) 97.5 F (36.4 C), temperature source Oral, resp. rate 20, height 5' 5.5" (1.664 m), weight 82.1 kg (181 lb), SpO2 95 %.  PHYSICAL EXAMINATION:  Physical Exam  GENERAL:  84 y.o.-year-old patient lying in the bed with mild respiratory distress EYES: Pupils equal, round, reactive to light and accommodation. No scleral icterus. Extraocular muscles intact.  HEENT: Head  atraumatic, normocephalic. Oropharynx and nasopharynx clear. No oropharyngeal erythema, moist oral mucosa  NECK:  Supple, no jugular venous distention. No thyroid enlargement, no tenderness.  LUNGS: Increased work of breathing with bilateral wheezing CARDIOVASCULAR: S1, S2 normal. No murmurs, rubs, or gallops.  ABDOMEN: Soft, nontender, nondistended. Bowel sounds present. No organomegaly or mass.  EXTREMITIES: No pedal edema, cyanosis, or clubbing. + 2 pedal & radial pulses b/l.   NEUROLOGIC: Cranial nerves II through XII are intact. No focal Motor or sensory deficits appreciated b/l PSYCHIATRIC: The patient is alert and oriented x 3. Good affect.  SKIN: No obvious rash, lesion, or ulcer.   LABORATORY PANEL:   CBC Recent Labs  Lab 09/27/17 0904  WBC 11.7*  HGB 14.7  HCT 43.3  PLT 177   ------------------------------------------------------------------------------------------------------------------  Chemistries  Recent Labs  Lab 09/27/17 0904  NA 141  K 3.8  CL 105  CO2 26  GLUCOSE 118*  BUN 32*  CREATININE 1.48*  CALCIUM 9.4  AST 31  ALT 19  ALKPHOS 94  BILITOT 0.9   ------------------------------------------------------------------------------------------------------------------  Cardiac Enzymes Recent Labs  Lab 09/27/17 0904  TROPONINI 0.04*   ------------------------------------------------------------------------------------------------------------------  RADIOLOGY:  Dg Chest Port 1 View  Result Date: 09/27/2017 CLINICAL DATA:  Acute shortness of breath. EXAM: PORTABLE CHEST 1 VIEW COMPARISON:  12/09/2016 and prior radiographs FINDINGS: Cardiomegaly noted. Increased ill-defined opacities overlying the mid-lower LEFT lung may represent airspace disease/pneumonia. There is no evidence of pulmonary edema, suspicious pulmonary nodule/mass, pleural effusion, or pneumothorax. No acute bony abnormalities are identified. IMPRESSION: Increased ill-defined opacities  overlying the mid-lower LEFT lung may represent airspace disease/pneumonia. Electronically Signed   By: Margarette Canada M.D.   On: 09/27/2017 09:20     IMPRESSION AND PLAN:   * Community acquired pneumonia, left Start levaquin. No risk of HCAP. Check urine strep antigen.  Blood cultures and sputum cultures. Patient does have wheezing with smoking history in the past.  Will add steroids and nebulizers.  *Sepsis present on admission secondary to pneumonia.  Elevated lactic acid.  IV fluids given in the emergency room.  *CKD stage III is stable  *Elevated blood pressure with history of hypertension.  Has not taken his morning medications.  Ordered.  IV medications ordered.  *DVT prophylaxis with Lovenox   All the records are reviewed and case discussed with ED provider. Management plans discussed with the patient, family and they are in agreement.  CODE STATUS: FULL CODE  TOTAL TIME TAKING CARE OF THIS PATIENT: 30 minutes.   Leia Alf Keilly Fatula M.D on 09/27/2017 at 11:04 AM  Between 7am to 6pm - Pager - [phone]  After 6pm go to [email or web address] - password [email or web address] [facility]  Office  [phone]  CC: Primary care physician; [name], MD  Note: This dictation was prepared with Dragon dictation along with smaller phrase technology. Any transcriptional errors that result from this process are unintentional.

## 2017-09-27 NOTE — ED Provider Notes (Signed)
[facility] Emergency Department Provider Note    First MD Initiated Contact with Patient 09/27/17 [phone]     (approximate)  I have reviewed the triage vital signs and the nursing notes.   HISTORY  Chief Complaint Shortness of Breath    HPI Stephen Garrett is a 84 y.o. male with the below listed past medical history presents the ER with chief complaint of sudden onset generalized weakness with shortness of breath and tachypnea with a shaking spell that occurred this morning when he awoke.  Does have some nausea.  Denies any fevers or cough but is having difficulty catching his breath.  States he is never felt like this before.  Denies any chest pain or pressure.  Denies any lower extremity swelling.  No numbness or tingling.  Patient found to be significantly tachypneic with oxygen saturation in the low 90s.  Patient does not endorse a history of COPD or bronchitis.    Past Medical History:  Diagnosis Date  . Actinic keratosis   . BPH (benign prostatic hypertrophy)   . Diseases of lips   . Diverticulosis of colon 06/2003  . ED (erectile dysfunction)   . GERD (gastroesophageal reflux disease)   . Heart murmur    as child  . HLD (hyperlipidemia)   . HOH (hard of hearing)   . HTN (hypertension)   . Osteoarthritis of knee    severe, bilat  . Renal insufficiency   . Restless legs syndrome (RLS)   . Sleep disturbance, unspecified   . Spinal stenosis, lumbar region, without neurogenic claudication    Family History  Problem Relation Age of Onset  . Other Father        "clogged arteries"  . Hypertension Mother    Past Surgical History:  Procedure Laterality Date  . CATARACT EXTRACTION, BILATERAL  2011   Dr. Thomasene Ripple  . COCHLEAR IMPLANT Right 02/15/2017   Procedure: RIGHT COCHLEAR IMPLANT;  Surgeon: Vicie Mutters, MD;  Location: [facility];  Service: ENT;  Laterality: Right;  . FINGER SURGERY     Right 4th finger  . [facility]  10/2009   Dr.  Bary Castilla  . LUMBAR SPINE SURGERY  06/2005   stenosis repair  . TONSILLECTOMY  childhood  . TOTAL KNEE ARTHROPLASTY  06/2008   bilat (Dr. Marry Guan)   Patient Active Problem List   Diagnosis Date Noted  . Pneumonia involving left lung 09/27/2017  . Diplopia 09/25/2017  . TIA (transient ischemic attack) 09/14/2017  . Fatigue 08/04/2017  . General weakness 08/04/2017  . Retinal vein occlusion, central 02/19/2014  . Chronic venous insufficiency 02/19/2014  . Episodic mood disorder ([place]) 12/20/2013  . Advanced directives, counseling/discussion 12/20/2013  . Routine general medical examination at a health care facility 11/02/2012  . ACTINIC KERATOSIS 11/18/2009  . Chronic kidney disease, stage III (moderate) ([place]) 01/26/2009  . Restless legs syndrome 10/03/2008  . OSTEOARTHRITIS 12/12/2006  . SPINAL STENOSIS, LUMBAR 06/24/2006  . Essential hypertension, benign 06/21/2006  . GERD 06/21/2006  . DIVERTICULOSIS, COLON 06/21/2006  . BPH with obstruction/lower urinary tract symptoms 06/21/2006      Prior to Admission medications   Medication Sig Start Date End Date Taking? Authorizing Provider  amLODipine (NORVASC) 5 MG tablet Take 1 tablet (5 mg total) by mouth daily. 08/17/16  Yes Viviana Simpler I, MD  aspirin EC 81 MG EC tablet Take 1 tablet (81 mg total) by mouth daily. 09/16/17  Yes Fritzi Mandes, MD  buPROPion (WELLBUTRIN XL) 150 MG  24 hr tablet TAKE ONE TABLET BY MOUTH EVERY DAY 04/10/17  Yes [name], MD  clonazePAM (KLONOPIN) 0.5 MG tablet TAKE 1 OR 2 TABLETS BY MOUTH AT BEDTIME Patient taking differently: Take 0.5-0.75 mg by mouth at bedtime as needed for sleep. 06/28/17  Yes [name], MD  finasteride (PROSCAR) 5 MG tablet TAKE ONE TABLET BY MOUTH EVERY DAY 05/29/17  Yes [name], MD  losartan-hydrochlorothiazide (HYZAAR) 100-25 MG tablet TAKE ONE TABLET BY MOUTH EVERY DAY 08/23/17  Yes [name], MD  Multiple Vitamin (MULTIVITAMIN) capsule Take 1 capsule by  mouth daily.     Yes [provider]  omeprazole (PRILOSEC) 40 MG capsule TAKE ONE CAPSULE BY MOUTH EVERY DAY 05/15/17  Yes [name], MD  pramipexole (MIRAPEX) 1 MG tablet TAKE 1 OR 2 TABLETS AT BEDTIME AS NEEDED Patient taking differently: Take 1 mg by mouth at dinner and 1 mg by mouth at bedtime. 01/23/17  Yes [name], MD  pyridoxine (B-6) 100 MG tablet Take 100 mg by mouth daily.   Yes [provider]  tamsulosin (FLOMAX) 0.4 MG CAPS capsule TAKE ONE CAPSULE BY MOUTH EVERY DAY 08/23/17  Yes [name], MD  citalopram (CELEXA) 10 MG tablet Take 1 tablet (10 mg total) by mouth daily. 04/22/11 05/18/11  [name], MD    Allergies Atorvastatin; Pravastatin sodium; and Statins    Social History Social History   Tobacco Use  . Smoking status: Former Smoker    Packs/day: 0.75    Years: 30.00    Pack years: 22.50    Last attempt to quit: 02/28/1970    Years since quitting: 47.6  . Smokeless tobacco: Never Used  Substance Use Topics  . Alcohol use: Yes    Alcohol/week: 0.0 oz    Comment: 1-2 glasses wine/daily  . Drug use: No    Review of Systems Patient denies headaches, rhinorrhea, blurry vision, numbness, shortness of breath, chest pain, edema, cough, abdominal pain, nausea, vomiting, diarrhea, dysuria, fevers, rashes or hallucinations unless otherwise stated above in HPI. ____________________________________________   PHYSICAL EXAM:  VITAL SIGNS: Vitals:   09/27/17 1156 09/27/17 1340  BP: (!) 172/77 (!) 146/72  Pulse: 87   Resp: 20   Temp: 97.9 F (36.6 C)   SpO2: 99% 95%    Constitutional: Alert and oriented. In mild to moderate resp distress Eyes: Conjunctivae are normal.  Head: Atraumatic. Nose: No congestion/rhinnorhea. Mouth/Throat: Mucous membranes are moist.   Neck: No stridor. Painless ROM.  Cardiovascular: Normal rate, regular rhythm. Grossly normal heart sounds.  Good peripheral circulation. Respiratory:  tachypnea and patient appears to be hyperventilating.  No retractions.inspiratory rhonci in left lung fields Gastrointestinal: Soft and nontender. No distention. No abdominal bruits. No CVA tenderness. Genitourinary:  Musculoskeletal: No lower extremity tenderness nor edema.  No joint effusions. Neurologic:  Normal speech and language. No gross focal neurologic deficits are appreciated. No facial droop Skin:  Skin is warm, dry and intact. No rash noted. Psychiatric: anxious appearing, but no agitation ____________________________________________   LABS (all labs ordered are listed, but only abnormal results are displayed)  Results for orders placed or performed during the hospital encounter of 09/27/17 (from the past 24 hour(s))  Lactic acid, plasma     Status: Abnormal   Collection Time: 09/27/17  9:04 AM  Result Value Ref Range   Lactic Acid, Venous 2.3 (HH) 0.5 - 1.9 mmol/L  Comprehensive metabolic panel     Status: Abnormal  Collection Time: 09/27/17  9:04 AM  Result Value Ref Range   Sodium 141 135 - 145 mmol/L   Potassium 3.8 3.5 - 5.1 mmol/L   Chloride 105 98 - 111 mmol/L   CO2 26 22 - 32 mmol/L   Glucose, Bld 118 (H) 70 - 99 mg/dL   BUN 32 (H) 8 - 23 mg/dL   Creatinine, Ser 1.48 (H) 0.61 - 1.24 mg/dL   Calcium 9.4 8.9 - 10.3 mg/dL   Total Protein 7.2 6.5 - 8.1 g/dL   Albumin 4.3 3.5 - 5.0 g/dL   AST 31 15 - 41 U/L   ALT 19 0 - 44 U/L   Alkaline Phosphatase 94 38 - 126 U/L   Total Bilirubin 0.9 0.3 - 1.2 mg/dL   GFR calc non Af Amer 39 (L) >60 mL/min   GFR calc Af Amer 46 (L) >60 mL/min   Anion gap 10 5 - 15  Troponin I     Status: Abnormal   Collection Time: 09/27/17  9:04 AM  Result Value Ref Range   Troponin I 0.04 (HH) <0.03 ng/mL  CBC WITH DIFFERENTIAL     Status: Abnormal   Collection Time: 09/27/17  9:04 AM  Result Value Ref Range   WBC 11.7 (H) 3.8 - 10.6 K/uL   RBC 4.72 4.40 - 5.90 MIL/uL   Hemoglobin 14.7 13.0 - 18.0 g/dL   HCT 43.3 40.0 - 52.0 %    MCV 91.8 80.0 - 100.0 fL   MCH 31.2 26.0 - 34.0 pg   MCHC 33.9 32.0 - 36.0 g/dL   RDW 14.6 (H) 11.5 - 14.5 %   Platelets 177 150 - 440 K/uL   Neutrophils Relative % 81 %   Neutro Abs 9.5 (H) 1.4 - 6.5 K/uL   Lymphocytes Relative 15 %   Lymphs Abs 1.8 1.0 - 3.6 K/uL   Monocytes Relative 3 %   Monocytes Absolute 0.3 0.2 - 1.0 K/uL   Eosinophils Relative 1 %   Eosinophils Absolute 0.1 0 - 0.7 K/uL   Basophils Relative 0 %   Basophils Absolute 0.0 0 - 0.1 K/uL  Lactic acid, plasma     Status: None   Collection Time: 09/27/17 12:27 PM  Result Value Ref Range   Lactic Acid, Venous 1.8 0.5 - 1.9 mmol/L  Blood gas, venous ([facility], [facility], [facility])     Status: Abnormal   Collection Time: 09/27/17  1:25 PM  Result Value Ref Range   pH, Ven 7.37 7.250 - 7.430   pCO2, Ven 52 44.0 - 60.0 mmHg   Bicarbonate 30.1 (H) 20.0 - 28.0 mmol/L   Acid-Base Excess 3.6 (H) 0.0 - 2.0 mmol/L   O2 Saturation UNABLE TO CALCULATE. %   Patient temperature 37.0    Collection site VEIN    Sample type VENIPUNCTURE    ____________________________________________  EKG My review and personal interpretation at Time: 8:52   Indication: resp dist  Rate: 95  Rhythm: sinus Axis: normal Other: nonsepcific st and t wave abn, no stemi ____________________________________________  RADIOLOGY  I personally reviewed all radiographic images ordered to evaluate for the above acute complaints and reviewed radiology reports and findings.  These findings were personally discussed with the patient.  Please see medical record for radiology report.  ____________________________________________   PROCEDURES  Procedure(s) performed:  .Critical Care Performed by: Merlyn Lot, MD Authorized by: Merlyn Lot, MD   Critical care provider statement:    Critical care time (minutes):  30   Critical  care time was exclusive of:  Separately billable procedures and treating other patients   Critical care was necessary to treat or  prevent imminent or life-threatening deterioration of the following conditions:  Respiratory failure and sepsis   Critical care was time spent personally by me on the following activities:  Development of treatment plan with patient or surrogate, discussions with consultants, evaluation of patient's response to treatment, examination of patient, obtaining history from patient or surrogate, ordering and performing treatments and interventions, ordering and review of laboratory studies, ordering and review of radiographic studies, pulse oximetry, re-evaluation of patient's condition and review of old charts      Critical Care performed: yes ____________________________________________   INITIAL IMPRESSION / ASSESSMENT AND PLAN / ED COURSE  Pertinent labs & imaging results that were available during my care of the patient were reviewed by me and considered in my medical decision making (see chart for details).   DDX: Asthma, copd, CHF, pna, ptx, malignancy, Pe, anemia  DUB MACLELLAN is a 84 y.o. who presents to the ED with symptoms as described above.  Patient arrives to the ER in moderate respiratory distress.  Patient very fatigued and weak and unable to stand without significant assist.  Presentation certainly concerning for pneumonia.  Chest x-ray ordered does show evidence of increasing infiltrate in left lung fields which would correspond with the patient's exam.  Mild leukocytosis.  Patient was recently admitted to the hospital therefore will treat for H CAP.  Anticipate patient will require hospitalization.  Clinical Course as of Sep 28 1446  Wed Sep 27, 2017  1008 Patient's lactate is elevated 2.3 but the patient is afebrile remains normotensive.  We will continue with IV resuscitation but I do believe the patient is stable and appropriate to receive the remainder of his volume resuscitation slowly during his admission.  Patient is received IV antibiotics.  Troponin mildly elevated but  likely secondary to demand ischemia.  Have discussed with the patient and available family all diagnostics and treatments performed thus far and all questions were answered to the best of my ability. The patient demonstrates understanding and agreement with plan.    [PR]  1009 MCH: 31.2 [PR]    Clinical Course User Index [PR] Merlyn Lot, MD     As part of my medical decision making, I reviewed the following data within the [place] notes reviewed and incorporated, Labs reviewed, notes from prior ED visits   ____________________________________________   FINAL CLINICAL IMPRESSION(S) / ED DIAGNOSES  Final diagnoses:  HCAP (healthcare-associated pneumonia)      NEW MEDICATIONS STARTED DURING THIS VISIT:  Current Discharge Medication List       Note:  This document was prepared using Dragon voice recognition software and may include unintentional dictation errors.    Merlyn Lot, MD 09/27/17 [phone]

## 2017-09-27 NOTE — Evaluation (Signed)
Physical Therapy Evaluation Patient Details Name: Stephen Garrett MRN: [id number] DOB: 10-Jul-1923 Today's Date: 09/27/2017   History of Present Illness  Patient is 84 yo male that presented to ED with SOB, generalized weakness and tachypnea.  Admitted for PNA and sepsis. PMH of GERD, HLD, HTN, renal insufficiency, heart murmur, TIA, dipolpia.  Clinical Impression  Patient A&Ox4 at start of session, [place]. Denies pain at start of session, reports SOB is better than this AM. Patient vitals stable at start of session, on [place] at 2L. Patient reports being independent in 1 level home at [facility], drives, plays tennis. Has someone that cleans 2x a month.  The patient demonstrated difficulty with bed mobility, needed extended time and use of bedrails for supine to sit and to scoot to EOB. Sit <> stand transfer with supervision, though patient reaches for support to stabilize himself. Patient ambulated in room ~59ft with handheld assist to improve unsteadiness while ambulating. Patient demonstrates sway and instability with stops, turns, and initiation of gait. The patient would benefit from further skilled PT to address these limitations in gait, balance, and mobility compared to PLOF. Current recommendation is home with [facility] and supervision for mobility.       Follow Up Recommendations Supervision for mobility/OOB;Home health PT    Equipment Recommendations  None recommended by PT;Other (comment)(Patient has SPC)    Recommendations for Other Services       Precautions / Restrictions Precautions Precautions: Fall Restrictions Weight Bearing Restrictions: No      Mobility  Bed Mobility Overal bed mobility: Needs Assistance Bed Mobility: Supine to Sit     Supine to sit: Supervision;HOB elevated     General bed mobility comments: Patient needed extensive time to sit up and scoot to EOB, use of bed rails to perform  Transfers Overall transfer level: Needs assistance   Transfers: Sit to/from  Stand Sit to Stand: Supervision            Ambulation/Gait Ambulation/Gait assistance: Min guard Gait Distance (Feet): 15 Feet Assistive device: 1 person hand held assist       General Gait Details: Patient demonstrates unsteadiness at intiation of gait and when stopping/turning. No LOB, but demonstrates some swaying during gait.  Stairs            Wheelchair Mobility    Modified Rankin (Stroke Patients Only)       Balance Overall balance assessment: Needs assistance Sitting-balance support: Feet supported Sitting balance-Leahy Scale: Good       Standing balance-Leahy Scale: Fair                               Pertinent Vitals/Pain Pain Assessment: No/denies pain    Home Living Family/patient expects to be discharged to:: Private residence Living Arrangements: Alone Available Help at Discharge: Family;Friend(s) Type of Home: Apartment([place]) Home Access: Level entry     Home Layout: Two level Home Equipment: [place] - single point      Prior Function Level of Independence: Independent         Comments: Patient drives, plays golf     Hand Dominance   Dominant Hand: Left    Extremity/Trunk Assessment   Upper Extremity Assessment Upper Extremity Assessment: RUE deficits/detail;LUE deficits/detail RUE Deficits / Details: 4/5 grossly LUE Deficits / Details: 4/5 grossly    Lower Extremity Assessment Lower Extremity Assessment: RLE deficits/detail;LLE deficits/detail RLE Deficits / Details: 4/5 grossly LLE Deficits / Details: 4/5  grossly       Communication   Communication: HOH  Cognition Arousal/Alertness: Awake/alert Behavior During Therapy: WFL for tasks assessed/performed Overall Cognitive Status: Within Functional Limits for tasks assessed                                        General Comments      Exercises     Assessment/Plan    PT Assessment Patient needs continued PT services  PT  Problem List Decreased strength;Decreased range of motion;Decreased activity tolerance;Decreased balance;Decreased mobility;Decreased safety awareness       PT Treatment Interventions DME instruction;Balance training;Gait training;Neuromuscular re-education;Functional mobility training;Patient/family education;Therapeutic activities;Therapeutic exercise    PT Goals (Current goals can be found in the Care Plan section)  Acute Rehab PT Goals Patient Stated Goal: Patient wants to return home  PT Goal Formulation: With patient Time For Goal Achievement: 10/11/17 Potential to Achieve Goals: Good    Frequency Min 2X/week   Barriers to discharge Decreased caregiver support      Co-evaluation               AM-PAC PT "6 Clicks" Daily Activity  Outcome Measure Difficulty turning over in bed (including adjusting bedclothes, sheets and blankets)?: None Difficulty moving from lying on back to sitting on the side of the bed? : A Lot Difficulty sitting down on and standing up from a chair with arms (e.g., wheelchair, bedside commode, etc,.)?: A Little Help needed moving to and from a bed to chair (including a wheelchair)?: A Little Help needed walking in hospital room?: A Little Help needed climbing 3-5 steps with a railing? : A Lot 6 Click Score: 17    End of Session Equipment Utilized During Treatment: Gait belt;Oxygen Activity Tolerance: Patient limited by fatigue Patient left: with chair alarm set;in chair;with call bell/phone within reach;Other (comment)(Nursing staff made aware green box for chair alarm not in room, chair alarm pad in place.) Nurse Communication: Mobility status PT Visit Diagnosis: Unsteadiness on feet (R26.81);Other abnormalities of gait and mobility (R26.89)    Time: 0940-7680 PT Time Calculation (min) (ACUTE ONLY): 24 min   Charges:   PT Evaluation $PT Eval Low Complexity: 1 Low PT Treatments $Therapeutic Activity: 8-22 mins       Lieutenant Diego PT,  DPT 2:48 PM,09/27/17 [phone]

## 2017-09-27 NOTE — ED Triage Notes (Signed)
Pt c/o sudden onset generalized weakness with SOB this morning, pt is tachypnea in triage with tremors noted.Marland Kitchen

## 2017-09-27 NOTE — ED Notes (Signed)
Report called to receiving RN on floor and pt to be transported to floor.

## 2017-09-27 NOTE — Progress Notes (Signed)
Advance care planning  Purpose of Encounter Pneumonia, weakness, CODE STATUS discussion  Patient is alert and oriented.  Able to make medical decisions.  He wants his healthcare power of attorney is to be both his son Daishon Chui and his friend Elsi Bolick.  Patient being admitted for pneumonia and weakness.  Expected to stay 2 days in the hospital.  He continues to stay active and tells me that he plays tennis regularly.  Would like to return to his baseline and is hopeful. Discussed regarding CODE STATUS.  Patient does not want to be on prolonged artificial life support.  But would like ventilator support, CPR and defibrillation as a temporary measure if needed.  Full code ordered  Time spent - 18 minutes

## 2017-09-28 LAB — BASIC METABOLIC PANEL
Anion gap: 10 (ref 5–15)
BUN: 32 mg/dL — ABNORMAL HIGH (ref 8–23)
CO2: 26 mmol/L (ref 22–32)
Calcium: 9.1 mg/dL (ref 8.9–10.3)
Chloride: 103 mmol/L (ref 98–111)
Creatinine, Ser: 1.49 mg/dL — ABNORMAL HIGH (ref 0.61–1.24)
GFR calc Af Amer: 45 mL/min — ABNORMAL LOW (ref >60)
GFR calc non Af Amer: 39 mL/min — ABNORMAL LOW (ref >60)
Glucose, Bld: 124 mg/dL — ABNORMAL HIGH (ref 70–99)
Potassium: 3.4 mmol/L — ABNORMAL LOW (ref 3.5–5.1)
Sodium: 139 mmol/L (ref 135–145)

## 2017-09-28 LAB — CBC
HCT: 36.4 % — ABNORMAL LOW (ref 40.0–52.0)
Hemoglobin: 12.5 g/dL — ABNORMAL LOW (ref 13.0–18.0)
MCH: 31.3 pg (ref 26.0–34.0)
MCHC: 34.3 g/dL (ref 32.0–36.0)
MCV: 91.2 fL (ref 80.0–100.0)
Platelets: 138 10*3/uL — ABNORMAL LOW (ref 150–440)
RBC: 3.99 MIL/uL — ABNORMAL LOW (ref 4.40–5.90)
RDW: 14.6 % — ABNORMAL HIGH (ref 11.5–14.5)
WBC: 12.2 10*3/uL — ABNORMAL HIGH (ref 3.8–10.6)

## 2017-09-28 LAB — STREP PNEUMONIAE URINARY ANTIGEN: Strep Pneumo Urinary Antigen: NEGATIVE

## 2017-09-28 MED ORDER — MELATONIN 5 MG PO TABS
5.0000 mg | ORAL_TABLET | Freq: Every evening | ORAL | Status: DC | PRN
  Administered 2017-09-28 (×2): 5 mg via ORAL
  Filled 2017-09-28 (×3): qty 1

## 2017-09-28 MED ORDER — GUAIFENESIN ER 600 MG PO TB12
600.0000 mg | ORAL_TABLET | Freq: Two times a day (BID) | ORAL | Status: DC
  Administered 2017-09-28 – 2017-09-29 (×3): 600 mg via ORAL
  Filled 2017-09-28 (×3): qty 1

## 2017-09-28 MED ORDER — SODIUM CHLORIDE 0.9 % IV SOLN
500.0000 mg | INTRAVENOUS | Status: DC
Start: 2017-09-28 — End: 2017-09-29
  Administered 2017-09-28: 500 mg via INTRAVENOUS
  Filled 2017-09-28 (×2): qty 500

## 2017-09-28 MED ORDER — SODIUM CHLORIDE 0.9 % IV SOLN
1.0000 g | INTRAVENOUS | Status: DC
  Administered 2017-09-28: 1 g via INTRAVENOUS
  Filled 2017-09-28: qty 1
  Filled 2017-09-28: qty 10

## 2017-09-28 NOTE — Progress Notes (Signed)
[place] at [place] NAME: Stephen Garrett    MR#:  [id number]  DATE OF BIRTH:  [birth year]  SUBJECTIVE:  CHIEF COMPLAINT:   Chief Complaint  Patient presents with  . Shortness of Breath  Patient seen and evaluated today Has cough and shortness of breath Cough productive of phlegm No fever  REVIEW OF SYSTEMS:    ROS  CONSTITUTIONAL: No documented fever. Has fatigue, weakness. No weight gain, no weight loss.  EYES: No blurry or double vision.  ENT: No tinnitus. No postnasal drip. No redness of the oropharynx.  RESPIRATORY: Has cough, no wheeze, no hemoptysis. Has dyspnea.  CARDIOVASCULAR: No chest pain. No orthopnea. No palpitations. No syncope.  GASTROINTESTINAL: No nausea, no vomiting or diarrhea. No abdominal pain. No melena or hematochezia.  GENITOURINARY: No dysuria or hematuria.  ENDOCRINE: No polyuria or nocturia. No heat or cold intolerance.  HEMATOLOGY: No anemia. No bruising. No bleeding.  INTEGUMENTARY: No rashes. No lesions.  MUSCULOSKELETAL: No arthritis. No swelling. No gout.  NEUROLOGIC: No numbness, tingling, or ataxia. No seizure-type activity.  PSYCHIATRIC: No anxiety. No insomnia. No ADD.   DRUG ALLERGIES:   Allergies  Allergen Reactions  . Atorvastatin     REACTION: raises blood pressure  . Pravastatin Sodium     REACTION: raises blood pressure  . Statins     REACTION: raises bp    VITALS:  Blood pressure (!) 107/58, pulse 66, temperature (!) 97.4 F (36.3 C), temperature source Oral, resp. rate 18, height 5' 5.5" (1.664 m), weight 185 lb 5 oz (84.1 kg), SpO2 98 %.  PHYSICAL EXAMINATION:   Physical Exam  GENERAL:  84 y.o.-year-old patient lying in the bed with no acute distress.  EYES: Pupils equal, round, reactive to light and accommodation. No scleral icterus. Extraocular muscles intact.  HEENT: Head atraumatic, normocephalic. Oropharynx and nasopharynx clear.  NECK:  Supple, no jugular venous  distention. No thyroid enlargement, no tenderness.  LUNGS: Decreased breath sounds bilaterally, rales in left lung. No use of accessory muscles of respiration.  CARDIOVASCULAR: S1, S2 normal. No murmurs, rubs, or gallops.  ABDOMEN: Soft, nontender, nondistended. Bowel sounds present. No organomegaly or mass.  EXTREMITIES: No cyanosis, clubbing or edema b/l.    NEUROLOGIC: Cranial nerves II through XII are intact. No focal Motor or sensory deficits b/l.   PSYCHIATRIC: The patient is alert and oriented x 3.  SKIN: No obvious rash, lesion, or ulcer.   LABORATORY PANEL:   CBC Recent Labs  Lab 09/28/17 0418  WBC 12.2*  HGB 12.5*  HCT 36.4*  PLT 138*   ------------------------------------------------------------------------------------------------------------------ Chemistries  Recent Labs  Lab 09/27/17 0904 09/28/17 0418  NA 141 139  K 3.8 3.4*  CL 105 103  CO2 26 26  GLUCOSE 118* 124*  BUN 32* 32*  CREATININE 1.48* 1.49*  CALCIUM 9.4 9.1  AST 31  --   ALT 19  --   ALKPHOS 94  --   BILITOT 0.9  --    ------------------------------------------------------------------------------------------------------------------  Cardiac Enzymes Recent Labs  Lab 09/27/17 0904  TROPONINI 0.04*   ------------------------------------------------------------------------------------------------------------------  RADIOLOGY:  Dg Chest Port 1 View  Result Date: 09/27/2017 CLINICAL DATA:  Acute shortness of breath. EXAM: PORTABLE CHEST 1 VIEW COMPARISON:  12/09/2016 and prior radiographs FINDINGS: Cardiomegaly noted. Increased ill-defined opacities overlying the mid-lower LEFT lung may represent airspace disease/pneumonia. There is no evidence of pulmonary edema, suspicious pulmonary nodule/mass, pleural effusion, or pneumothorax. No acute bony abnormalities are identified. IMPRESSION: Increased  ill-defined opacities overlying the mid-lower LEFT lung may represent airspace disease/pneumonia.  Electronically Signed   By: Margarette Canada M.D.   On: 09/27/2017 09:20     ASSESSMENT AND PLAN:   84 year old elderly male patient currently under hospitalist service for shortness of breath , cough Has history of prostate hypertrophy, diverticulosis of colon, GERD, hyperlipidemia, hypertension.  -Sepsis secondary to pneumonia improving slowly Follow-up cultures  continue IV antibiotics Lactic acid has come down  -Community-acquired pneumonia left lung IV Rocephin and Zithromax antibiotics Incentive spirometry  -CKD stage III Follow-up renal function  DVT prophylaxis with subcu Lovenox daily  Physical therapy evaluation for endurance training  All the records are reviewed and case discussed with Care Management/Social Worker. Management plans discussed with the patient, family and they are in agreement.  CODE STATUS: Full code  DVT Prophylaxis: SCDs  TOTAL TIME TAKING CARE OF THIS PATIENT: 35 minutes.   POSSIBLE D/C IN 2 to 3 DAYS, DEPENDING ON CLINICAL CONDITION.  Saundra Shelling M.D on 09/28/2017 at 12:41 PM  Between 7am to 6pm - Pager - [phone]  After 6pm go to [email or web address] - password [email or web address] [place] [facility]  Office  [phone]  CC: Primary care physician; [name], MD  Note: This dictation was prepared with Dragon dictation along with smaller phrase technology. Any transcriptional errors that result from this process are unintentional.

## 2017-09-29 LAB — BASIC METABOLIC PANEL
Anion gap: 6 (ref 5–15)
BUN: 32 mg/dL — ABNORMAL HIGH (ref 8–23)
CO2: 31 mmol/L (ref 22–32)
Calcium: 9.3 mg/dL (ref 8.9–10.3)
Chloride: 101 mmol/L (ref 98–111)
Creatinine, Ser: 1.27 mg/dL — ABNORMAL HIGH (ref 0.61–1.24)
GFR calc Af Amer: 55 mL/min — ABNORMAL LOW (ref >60)
GFR calc non Af Amer: 47 mL/min — ABNORMAL LOW (ref >60)
Glucose, Bld: 100 mg/dL — ABNORMAL HIGH (ref 70–99)
Potassium: 3.5 mmol/L (ref 3.5–5.1)
Sodium: 138 mmol/L (ref 135–145)

## 2017-09-29 LAB — CBC
HCT: 36.8 % — ABNORMAL LOW (ref 40.0–52.0)
Hemoglobin: 12.9 g/dL — ABNORMAL LOW (ref 13.0–18.0)
MCH: 32.1 pg (ref 26.0–34.0)
MCHC: 35.1 g/dL (ref 32.0–36.0)
MCV: 91.3 fL (ref 80.0–100.0)
Platelets: 142 10*3/uL — ABNORMAL LOW (ref 150–440)
RBC: 4.03 MIL/uL — ABNORMAL LOW (ref 4.40–5.90)
RDW: 14.5 % (ref 11.5–14.5)
WBC: 10.6 10*3/uL (ref 3.8–10.6)

## 2017-09-29 LAB — URINE CULTURE: Culture: NO GROWTH

## 2017-09-29 MED ORDER — LEVOFLOXACIN 250 MG PO TABS: 250.0000 mg | ORAL_TABLET | Freq: Every day | ORAL | 0 refills | Status: AC

## 2017-09-29 MED ORDER — POTASSIUM CHLORIDE CRYS ER 20 MEQ PO TBCR
40.0000 meq | EXTENDED_RELEASE_TABLET | Freq: Once | ORAL | Status: AC
  Administered 2017-09-29: 08:00:00 40 meq via ORAL
  Filled 2017-09-29: qty 2

## 2017-09-29 NOTE — Care Management Note (Signed)
Case Management Note  Patient Details  Name: Stephen Garrett MRN: [id number] Date of Birth: August 22, 1923  Subjective/Objective:  Admitted to [facility] with the diagnosis of pneumonia. Lives at [place] x 16 years. Seen Dr. Silvio Pate about a week ago. Prescriptions are filled at [facility]. No home Health. Health Care Building x 4-5 hours. No equipment in the home. Takes care of all basic activities of daily living himself, drives. Plays tennis 2 times a week. Last fall was 2 years ago on tennis court. "Too good appetite." Lady friend will transport                  Action/Plan:Physical therapy evaluation completed. Supervision for mobility home health and physical therapy. Declining services at this time. Son will be coming to stay with him for a couple of days   Expected Discharge Date:  09/29/17               Expected Discharge Plan:     In-House Referral:   yes  Discharge planning Services   yes  Post Acute Care Choice:    Choice offered to:     DME Arranged:    DME Agency:     HH Arranged:    HH Agency:     Status of Service:     If discussed at [employer], dates discussed:    Additional Comments:  Shelbie Ammons, RN MSN CCM Care Management [phone] 09/29/2017, 9:41 AM

## 2017-09-29 NOTE — Progress Notes (Signed)
Discussed discharge instructions and medications with patient. IV removed. All questions addressed. Patient transported home via car by his lady friend.  Clarise Cruz, RN, BSN

## 2017-09-29 NOTE — Discharge Summary (Signed)
[place] at [place] NAME: Stephen Garrett    MR#:  [id number]  DATE OF BIRTH:  11-03-1923  DATE OF ADMISSION:  09/27/2017 ADMITTING PHYSICIAN: Hillary Bow, MD  DATE OF DISCHARGE: 09/29/2017 11:26 AM  PRIMARY CARE PHYSICIAN: [name], MD   ADMISSION DIAGNOSIS:  Community-acquired pneumonia Hypoxia Sepsis CKD stage III Hypertension DISCHARGE DIAGNOSIS:  Active Problems:   Pneumonia involving left lung Sepsis secondary to pneumonia CKD stage III Hypertension  SECONDARY DIAGNOSIS:   Past Medical History:  Diagnosis Date  . Actinic keratosis   . BPH (benign prostatic hypertrophy)   . Diseases of lips   . Diverticulosis of colon 06/2003  . ED (erectile dysfunction)   . GERD (gastroesophageal reflux disease)   . Heart murmur    as child  . HLD (hyperlipidemia)   . HOH (hard of hearing)   . HTN (hypertension)   . Osteoarthritis of knee    severe, bilat  . Renal insufficiency   . Restless legs syndrome (RLS)   . Sleep disturbance, unspecified   . Spinal stenosis, lumbar region, without neurogenic claudication      ADMITTING HISTORY Stephen Garrett  is a 84 y.o. male with a known history of HTN, decreased hearing, BPH presents to the hospital with weakness and shortness of breath since waking up today morning.  Patient has past smoking history but is quit for 50 years now.  He continues to stay active.  Plays tennis regularly.  But today he felt extremely weak on waking up.  Afebrile.  Tachypneic and tachycardia. He was given 2 doses of antibiotic prior to dental procedure recently.  Had less than 24 hours stay in the hospital for TIA.  No risk of multidrug-resistant organism infection. Saturations 94% on room air. Patient is being admitted for pneumonia and sepsis present on admission  HOSPITAL COURSE:  Patient was admitted to medical floor.  Was put on oxygen via nasal cannula and received IV Rocephin and Zithromax  antibiotics.  Patient received IV fluids.  His shortness of breath and cough improved.  He received a breathing treatments and incentive spirometry.  Patient  lactic acid was elevated when he presented to the hospital and was trended. He was weaned off oxygen.  Patients pneumonia improved and sepsis resolved. patient tolerated diet well.  Cultures did not reveal any growth.  Patient will be discharged home on oral antibiotics and follow-up with primary care physician in the clinic.  CONSULTS OBTAINED:   None DRUG ALLERGIES:   Allergies  Allergen Reactions  . Atorvastatin     REACTION: raises blood pressure  . Pravastatin Sodium     REACTION: raises blood pressure  . Statins     REACTION: raises bp    DISCHARGE MEDICATIONS:   Allergies as of 09/29/2017      Reactions   Atorvastatin    REACTION: raises blood pressure   Pravastatin Sodium    REACTION: raises blood pressure   Statins    REACTION: raises bp      Medication List    TAKE these medications   amLODipine 5 MG tablet Commonly known as:  NORVASC Take 1 tablet (5 mg total) by mouth daily.   aspirin 81 MG EC tablet Take 1 tablet (81 mg total) by mouth daily.   buPROPion 150 MG 24 hr tablet Commonly known as:  WELLBUTRIN XL TAKE ONE TABLET BY MOUTH EVERY DAY   clonazePAM 0.5 MG tablet Commonly known as:  [employer]  TAKE 1 OR 2 TABLETS BY MOUTH AT BEDTIME What changed:  See the new instructions.   finasteride 5 MG tablet Commonly known as:  PROSCAR TAKE ONE TABLET BY MOUTH EVERY DAY   levofloxacin 250 MG tablet Commonly known as:  LEVAQUIN Take 1 tablet (250 mg total) by mouth daily for 5 days.   losartan-hydrochlorothiazide 100-25 MG tablet Commonly known as:  HYZAAR TAKE ONE TABLET BY MOUTH EVERY DAY   multivitamin capsule Take 1 capsule by mouth daily.   omeprazole 40 MG capsule Commonly known as:  PRILOSEC TAKE ONE CAPSULE BY MOUTH EVERY DAY   pramipexole 1 MG tablet Commonly known as:   MIRAPEX TAKE 1 OR 2 TABLETS AT BEDTIME AS NEEDED What changed:  See the new instructions.   pyridoxine 100 MG tablet Commonly known as:  B-6 Take 100 mg by mouth daily.   tamsulosin 0.4 MG Caps capsule Commonly known as:  FLOMAX TAKE ONE CAPSULE BY MOUTH EVERY DAY       Today  Patient seen today Decreased shortness of breath No cough Weaned off oxygen Tolerating diet well  VITAL SIGNS:  Blood pressure (!) 175/91, pulse 67, temperature (!) 97.5 F (36.4 C), temperature source Oral, resp. rate 20, height 5' 5.5" (1.664 m), weight 185 lb 5 oz (84.1 kg), SpO2 98 %.  I/O:    Intake/Output Summary (Last 24 hours) at 09/29/2017 1234 Last data filed at 09/29/2017 0957 Gross per 24 hour  Intake 740 ml  Output -  Net 740 ml    PHYSICAL EXAMINATION:  Physical Exam  GENERAL:  84 y.o.-year-old patient lying in the bed with no acute distress.  LUNGS: Normal breath sounds bilaterally, no wheezing, rales,rhonchi or crepitation. No use of accessory muscles of respiration.  CARDIOVASCULAR: S1, S2 normal. No murmurs, rubs, or gallops.  ABDOMEN: Soft, non-tender, non-distended. Bowel sounds present. No organomegaly or mass.  NEUROLOGIC: Moves all 4 extremities. PSYCHIATRIC: The patient is alert and oriented x 3.  SKIN: No obvious rash, lesion, or ulcer.   DATA REVIEW:   CBC Recent Labs  Lab 09/29/17 0428  WBC 10.6  HGB 12.9*  HCT 36.8*  PLT 142*    Chemistries  Recent Labs  Lab 09/27/17 0904  09/29/17 0428  NA 141   < > 138  K 3.8   < > 3.5  CL 105   < > 101  CO2 26   < > 31  GLUCOSE 118*   < > 100*  BUN 32*   < > 32*  CREATININE 1.48*   < > 1.27*  CALCIUM 9.4   < > 9.3  AST 31  --   --   ALT 19  --   --   ALKPHOS 94  --   --   BILITOT 0.9  --   --    < > = values in this interval not displayed.    Cardiac Enzymes Recent Labs  Lab 09/27/17 0904  TROPONINI 0.04*    Microbiology Results  Results for orders placed or performed during the hospital encounter  of 09/27/17  Blood Culture (routine x 2)     Status: None (Preliminary result)   Collection Time: 09/27/17  9:04 AM  Result Value Ref Range Status   Specimen Description BLOOD RIGHT HAND  Final   Special Requests   Final    BOTTLES DRAWN AEROBIC AND ANAEROBIC Blood Culture results may not be optimal due to an inadequate volume of blood received in culture bottles  Culture   Final    NO GROWTH 2 DAYS Performed at [facility], [address] [place], [place] [address]    Report Status PENDING  Incomplete  Blood Culture (routine x 2)     Status: None (Preliminary result)   Collection Time: 09/27/17  9:04 AM  Result Value Ref Range Status   Specimen Description BLOOD RIGHT HAND  Final   Special Requests   Final    BOTTLES DRAWN AEROBIC AND ANAEROBIC Blood Culture results may not be optimal due to an inadequate volume of blood received in culture bottles   Culture   Final    NO GROWTH 2 DAYS Performed at [facility], [address] [place], [place] [address]    Report Status PENDING  Incomplete  Urine culture     Status: None   Collection Time: 09/27/17  5:13 PM  Result Value Ref Range Status   Specimen Description   Final    URINE, RANDOM Performed at [facility], [address] [place], [place] [address]    Special Requests   Final    NONE Performed at [facility], [address] [place], [place] [address]    Culture   Final    NO GROWTH Performed at [facility], [place] [address], [place], [place] [address]    Report Status 09/29/2017 FINAL  Final    RADIOLOGY:  No results found.  Follow up with PCP in 1 week.  Management plans discussed with the patient, family and they are in agreement.  CODE STATUS: Full code    Code Status Orders  (From admission, onward)        Start     Ordered   09/27/17 1101  Full code  Continuous     09/27/17 1101    Code Status History    Date Active Date Inactive Code Status  Order ID Comments User Context   09/14/2017 1532 09/15/2017 1526 Full Code [id number]  Henreitta Leber, MD Inpatient   02/15/2017 1208 02/15/2017 1338 Full Code [id number]  Vicie Mutters, MD Inpatient    Advance Directive Documentation     Most Recent Value  Type of Advance Directive  Healthcare Power of Attorney  Pre-existing out of facility DNR order (yellow form or pink MOST form)  -  "MOST" Form in Place?  -      TOTAL TIME TAKING CARE OF THIS PATIENT ON DAY OF DISCHARGE: more than 33 minutes.   Saundra Shelling M.D on 09/29/2017 at 12:34 PM  Between 7am to 6pm - Pager - [phone]  After 6pm go to [email or web address] - password [email or web address] [place] [facility]  Office  [phone]  CC: Primary care physician; [name], MD  Note: This dictation was prepared with Dragon dictation along with smaller phrase technology. Any transcriptional errors that result from this process are unintentional.

## 2017-09-30 ENCOUNTER — Encounter: Payer: Self-pay | Admitting: Internal Medicine

## 2017-10-02 LAB — CULTURE, BLOOD (ROUTINE X 2)
Culture: NO GROWTH
Culture: NO GROWTH

## 2017-10-03 ENCOUNTER — Telehealth: Payer: Self-pay | Admitting: *Deleted

## 2017-10-03 LAB — ACETYLCHOLINE RECEPTOR AB, ALL
Acety choline binding ab: <0.03 nmol/L (ref 0.00–0.24)
Acetylchol Block Ab: 17 % (ref 0–25)
Acetylcholine Modulat Ab: <12 % (ref 0–20)

## 2017-10-03 NOTE — Telephone Encounter (Signed)
Lm on pts vm requesting a call back with current status. Per PCP, if pt returns call, pls confirm if he would be able to come in for sooner appt. If not, pls confirm if pts breathing is ok and how he is currently feeling. CRM created

## 2017-10-09 ENCOUNTER — Encounter: Payer: Self-pay | Admitting: Internal Medicine

## 2017-10-09 ENCOUNTER — Ambulatory Visit (INDEPENDENT_AMBULATORY_CARE_PROVIDER_SITE_OTHER): Payer: Medicare Other | Admitting: Internal Medicine

## 2017-10-09 ENCOUNTER — Ambulatory Visit: Payer: Self-pay | Admitting: *Deleted

## 2017-10-09 VITALS — BP 128/80 | HR 72 | Ht 65.5 in | Wt 183.0 lb

## 2017-10-09 DIAGNOSIS — W08XXXA Fall from other furniture, initial encounter: Secondary | ICD-10-CM | POA: Insufficient documentation

## 2017-10-09 DIAGNOSIS — J181 Lobar pneumonia, unspecified organism: Secondary | ICD-10-CM | POA: Diagnosis not present

## 2017-10-09 DIAGNOSIS — M533 Sacrococcygeal disorders, not elsewhere classified: Secondary | ICD-10-CM | POA: Diagnosis not present

## 2017-10-09 NOTE — Assessment & Plan Note (Signed)
Vague x-ray findings Finished out the levaquin No symptoms now Will set up follow up CXR 2-3 weeks

## 2017-10-09 NOTE — Progress Notes (Signed)
Subjective:    Patient ID: Stephen Garrett, male    DOB: Nov 19, 1923, 84 y.o.   MRN: [id number]  HPI Here with lady friend (she drove) Recent hospitalization for community acquired pneumonina Took 5 days of levaquin--wondered if that was causing some of his problems  Then yesterday--was standing in living room in early afternoon [place] asleep standing and fell onto wooden chair---hit near his talibone He did work out on the treadmill Saturday night--- 10-15 minutes (slowly), but still feels he might have overdone it  No fever No cough Breathing feels okay  Walks with cane--for safety Has driven car  Current Outpatient Medications on File Prior to Visit  Medication Sig Dispense Refill  . amLODipine (NORVASC) 5 MG tablet Take 1 tablet (5 mg total) by mouth daily. 90 tablet 3  . aspirin EC 81 MG EC tablet Take 1 tablet (81 mg total) by mouth daily. 30 tablet 1  . buPROPion (WELLBUTRIN XL) 150 MG 24 hr tablet TAKE ONE TABLET BY MOUTH EVERY DAY 90 tablet 3  . clonazePAM (KLONOPIN) 0.5 MG tablet TAKE 1 OR 2 TABLETS BY MOUTH AT BEDTIME (Patient taking differently: Take 0.5-0.75 mg by mouth at bedtime as needed for sleep.) 180 tablet 0  . finasteride (PROSCAR) 5 MG tablet TAKE ONE TABLET BY MOUTH EVERY DAY 90 tablet 3  . losartan-hydrochlorothiazide (HYZAAR) 100-25 MG tablet TAKE ONE TABLET BY MOUTH EVERY DAY 90 tablet 3  . Multiple Vitamin (MULTIVITAMIN) capsule Take 1 capsule by mouth daily.      Marland Kitchen omeprazole (PRILOSEC) 40 MG capsule TAKE ONE CAPSULE BY MOUTH EVERY DAY 90 capsule 3  . pramipexole (MIRAPEX) 1 MG tablet TAKE 1 OR 2 TABLETS AT BEDTIME AS NEEDED (Patient taking differently: Take 1 mg by mouth at dinner and 1 mg by mouth at bedtime.) 180 tablet 3  . pyridoxine (B-6) 100 MG tablet Take 100 mg by mouth daily.    . tamsulosin (FLOMAX) 0.4 MG CAPS capsule TAKE ONE CAPSULE BY MOUTH EVERY DAY 90 capsule 3  . [DISCONTINUED] citalopram (CELEXA) 10 MG tablet Take 1 tablet (10 mg total) by  mouth daily. 30 tablet 5   No current facility-administered medications on file prior to visit.     Allergies  Allergen Reactions  . Atorvastatin     REACTION: raises blood pressure  . Pravastatin Sodium     REACTION: raises blood pressure  . Statins     REACTION: raises bp    Past Medical History:  Diagnosis Date  . Actinic keratosis   . BPH (benign prostatic hypertrophy)   . Diseases of lips   . Diverticulosis of colon 06/2003  . ED (erectile dysfunction)   . GERD (gastroesophageal reflux disease)   . Heart murmur    as child  . HLD (hyperlipidemia)   . HOH (hard of hearing)   . HTN (hypertension)   . Osteoarthritis of knee    severe, bilat  . Renal insufficiency   . Restless legs syndrome (RLS)   . Sleep disturbance, unspecified   . Spinal stenosis, lumbar region, without neurogenic claudication     Past Surgical History:  Procedure Laterality Date  . CATARACT EXTRACTION, BILATERAL  2011   Dr. Thomasene Ripple  . COCHLEAR IMPLANT Right 02/15/2017   Procedure: RIGHT COCHLEAR IMPLANT;  Surgeon: Vicie Mutters, MD;  Location: [place];  Service: ENT;  Laterality: Right;  . FINGER SURGERY     Right 4th finger  . [facility]  10/2009   Dr.  Byrnett  . LUMBAR SPINE SURGERY  06/2005   stenosis repair  . TONSILLECTOMY  childhood  . TOTAL KNEE ARTHROPLASTY  06/2008   bilat (Dr. Marry Guan)    Family History  Problem Relation Age of Onset  . Other Father        "clogged arteries"  . Hypertension Mother     Social History   Socioeconomic History  . Marital status: Widowed    Spouse name: Not on file  . Number of children: 2  . Years of education: Not on file  . Highest education level: Not on file  Occupational History  . Occupation: Retired  Scientific laboratory technician  . Financial resource strain: Not on file  . Food insecurity:    Worry: Not on file    Inability: Not on file  . Transportation needs:    Medical: Not on file    Non-medical: Not on file  Tobacco Use  .  Smoking status: Former Smoker    Packs/day: 0.75    Years: 30.00    Pack years: 22.50    Last attempt to quit: 02/28/1970    Years since quitting: 47.6  . Smokeless tobacco: Never Used  Substance and Sexual Activity  . Alcohol use: Yes    Alcohol/week: 0.0 standard drinks    Comment: 1-2 glasses wine/daily  . Drug use: No  . Sexual activity: Not on file  Lifestyle  . Physical activity:    Days per week: Not on file    Minutes per session: Not on file  . Stress: Not on file  Relationships  . Social connections:    Talks on phone: Not on file    Gets together: Not on file    Attends religious service: Not on file    Active member of club or organization: Not on file    Attends meetings of clubs or organizations: Not on file    Relationship status: Not on file  . Intimate partner violence:    Fear of current or ex partner: Not on file    Emotionally abused: Not on file    Physically abused: Not on file    Forced sexual activity: Not on file  Other Topics Concern  . Not on file  Social History Narrative   Widowed 2010   2 adopted children   Has living will   Son is health care power of attorney   Would accept resuscitation but no prolonged ventilation   No tube feeds if cognitively unaware   Review of Systems Appetite is "too good" Has not lost weight (lost in hospital and gained it all back) Sleeping okay    Objective:   Physical Exam  Constitutional: He appears well-developed. No distress.  Neck: No thyromegaly present.  Cardiovascular: Normal rate, regular rhythm and normal heart sounds. Exam reveals no gallop.  No murmur heard. Respiratory:  Decreased breath sounds Slight bibasilar crackles  Musculoskeletal:  Moderate coccyx tenderness No other spine or back tenderness  Lymphadenopathy:    He has no cervical adenopathy.  Neurological:  Antalgic gait but loosens up some Hunched over some           Assessment & Plan:

## 2017-10-09 NOTE — Telephone Encounter (Signed)
Pt already has appt 10/09/17 at 4:30 with Dr Silvio Pate.

## 2017-10-09 NOTE — Telephone Encounter (Signed)
Pt stated he fell asleep standing up and fell against a wooden chair, possibly yesterday or the day before. He hit his left chest. He did not break the skin.  It only bothers him when he sits, otherwise no pain.  Denies fever, weakness, or difficulty breathing. Appointment scheduled per protocol.  Advised to call back for increase in symptoms, pt voiced understanding.  Reason for Disposition . [1] High-risk adult (e.g., age > 27, osteoporosis, chronic steroid use) AND [2] still hurts  Answer Assessment - Initial Assessment Questions 1. MECHANISM: "How did the injury happen?"     Fell asleep standing up and fell against wooden chair 2. ONSET: "When did the injury happen?" (Minutes or hours ago)     Yesterday or day before 3. LOCATION: "Where on the chest is the injury located?"     Left side 4. APPEARANCE: "What does the injury look like?"     Cant see it 5. BLEEDING: "Is there any bleeding now? If so, ask: How long has it been bleeding?"     no 6. SEVERITY: "Any difficulty with breathing?"     no 7. SIZE: For cuts, bruises, or swelling, ask: "How large is it?" (e.g., inches or centimeters)     no 8. PAIN: "Is there pain?" If so, ask: "How bad is the pain?"   (e.g., Scale 1-10; or mild, moderate, severe)     Sitting down a 6 or 7 9. TETANUS: For any breaks in the skin, ask: "When was the last tetanus booster?"     n/a  Protocols used: CHEST INJURY-A-AH

## 2017-10-09 NOTE — Assessment & Plan Note (Signed)
Still having trouble walking Will set up with PT

## 2017-10-09 NOTE — Assessment & Plan Note (Signed)
After direct fall on wooden chair Setting up with PT Discussed local care/padding, etc

## 2017-10-09 NOTE — Telephone Encounter (Signed)
Will see today.  

## 2017-10-10 DIAGNOSIS — S0001XA Abrasion of scalp, initial encounter: Secondary | ICD-10-CM | POA: Diagnosis not present

## 2017-10-10 DIAGNOSIS — H903 Sensorineural hearing loss, bilateral: Secondary | ICD-10-CM | POA: Diagnosis not present

## 2017-10-10 NOTE — Telephone Encounter (Signed)
This is who he wants for the PT referral

## 2017-10-11 ENCOUNTER — Ambulatory Visit: Payer: Self-pay | Admitting: Internal Medicine

## 2017-10-11 NOTE — Telephone Encounter (Signed)
I will see him then

## 2017-10-11 NOTE — Telephone Encounter (Signed)
FYI

## 2017-10-11 NOTE — Telephone Encounter (Signed)
He called in c/o his right hand hurting from a fall from a few days ago.   "It is swollen and hurts really bad".   He has seen Dr. Silvio Pate since the fall for his other injuries but his hand was not bothering him at that time.  (10/09/17)  "Dr. Silvio Pate has seen me for my other injuries sustained from the fall".    "I fell asleep standing up and went down hard".  (I accidentally clicked on the head injury protocol so disregard that section).  See triage notes for the hand injury.  I spoke with the flow coordinator to see if they could x-ray his hand and evaluate this kind of injury.   They can.   Dr. Glori Bickers had same day appts available for 10/12/17. I scheduled him with Dr. Glori Bickers on 10/12/17 at 8:45.   He was agreeable to this plan.   Reason for Disposition . Can't use injured hand normally (e.g., make a fist, open fully, hold a glass of water)  Answer Assessment - Initial Assessment Questions 1. MECHANISM: "How did the injury happen?" For falls, ask: "What height did you fall from?" and "What surface did you fall against?"      I fell asleep standing and went down hard.    I've seen Dr. Silvio Pate since I fell but the hand did was not bothering me at that time. 2. ONSET: "When did the injury happen?" (Minutes or hours ago)      The right hand was used to catch myself when I fell.   I've been icing it.   It's not helping.   It's very swollen.   I can't use it at all. 3. NEUROLOGIC SYMPTOMS: "Was there any loss of consciousness?" "Are there any other neurological symptoms?"      It just hurts.    It's hard to hold a pencil in my hand.    4. MENTAL STATUS: "Does the person know who he is, who you are, and where he is?"      Yes 5. LOCATION: "What part of the head was hit?"      I think I put the palm of my hand down to catch my fall. 6. SCALP APPEARANCE: "What does the scalp look like? Is it bleeding now?" If so, ask: "Is it difficult to stop?"      The hand is pink.    7. SIZE: For cuts, bruises, or  swelling, ask: "How large is it?" (e.g., inches or centimeters)      No bruises that I can see 8. PAIN: "Is there any pain?" If so, ask: "How bad is it?"  (e.g., Scale 1-10; or mild, moderate, severe)     Yes.   Hurting bad. 9. TETANUS: For any breaks in the skin, ask: "When was the last tetanus booster?"     I have no idea. 10. OTHER SYMPTOMS: "Do you have any other symptoms?" (e.g., neck pain, vomiting)       *No Answer* 11. PREGNANCY: "Is there any chance you are pregnant?" "When was your last menstrual period?"       N/A  Answer Assessment - Initial Assessment Questions 1. MECHANISM: "How did the injury happen?"     I fell asleep standing up and went down hard.   I saw Dr. Silvio Pate but at the time I saw him my hand was not bothering me.   It really hurts now and I can't hardly use it. 2. ONSET: "When did the injury happen?" (  Minutes or hours ago)      *No Answer* 3. APPEARANCE of INJURY: "What does the injury look like?"      My right hand is very swollen and it hurts. 4. SEVERITY: "Can you use the hand normally?" "Can you bend your fingers into a ball and then fully open them?"     No.   I can't hold a pencil.  It hurts to touch it. 5. SIZE: For cuts, bruises, or swelling, ask: "How large is it?" (e.g., inches or centimeters;  entire hand or wrist)      No bruising per pt. 6. PAIN: "Is there pain?" If so, ask: "How bad is the pain?"  (Scale 1-10; or mild, moderate, severe)     YES! 7. TETANUS: For any breaks in the skin, ask: "When was the last tetanus booster?"     No 8. OTHER SYMPTOMS: "Do you have any other symptoms?"      No 9. PREGNANCY: "Is there any chance you are pregnant?" "When was your last menstrual period?"     N/A  Protocols used: HAND AND WRIST INJURY-A-AH, HEAD INJURY-A-AH

## 2017-10-12 ENCOUNTER — Ambulatory Visit (INDEPENDENT_AMBULATORY_CARE_PROVIDER_SITE_OTHER)
Admission: RE | Admit: 2017-10-12 | Discharge: 2017-10-12 | Disposition: A | Discharge status: Home / Self Care | Payer: Medicare Other | Source: Ambulatory Visit | Attending: Family Medicine | Admitting: Family Medicine

## 2017-10-12 ENCOUNTER — Ambulatory Visit (INDEPENDENT_AMBULATORY_CARE_PROVIDER_SITE_OTHER): Payer: Medicare Other | Admitting: Family Medicine

## 2017-10-12 ENCOUNTER — Encounter: Payer: Self-pay | Admitting: Family Medicine

## 2017-10-12 VITALS — BP 118/74 | HR 64 | Temp 97.5°F | Ht 65.5 in | Wt 181.5 lb

## 2017-10-12 DIAGNOSIS — M79641 Pain in right hand: Secondary | ICD-10-CM

## 2017-10-12 DIAGNOSIS — J181 Lobar pneumonia, unspecified organism: Secondary | ICD-10-CM | POA: Diagnosis not present

## 2017-10-12 DIAGNOSIS — S6991XA Unspecified injury of right wrist, hand and finger(s), initial encounter: Secondary | ICD-10-CM | POA: Diagnosis not present

## 2017-10-12 DIAGNOSIS — M25531 Pain in right wrist: Secondary | ICD-10-CM | POA: Diagnosis not present

## 2017-10-12 NOTE — Assessment & Plan Note (Signed)
Clinically improved.  

## 2017-10-12 NOTE — Progress Notes (Signed)
Subjective:    Patient ID: Stephen Garrett, male    DOB: June 19, 1923, 84 y.o.   MRN: [id number]  HPI  Here for hand injury   He fell 8/11 when he was sick (fell asleep standing up)  He fell hard   Saw Dr Silvio Pate on 8/12  He got worse -hand pain   Was in the hospital 2 weeks ago - pneumonia  Finished with abx  Took levaquin    R hand is painful and swollen Do not remember how hand was traumatized  He hit chair and table   Most painful spot is in the hypothenar area and up into wrist  Tried to wrap it- the pressure hurt it Not bruised  Hurts to flex and ext wrist and also to flex fingers   Xrays : DG Hand Complete Right (Accession [id number]) (Order [id number])  Imaging  Date: 10/12/2017 Department: [facility] Released By: Lendon Collar, RT Authorizing: Jesstin Studstill, Wynelle Fanny, MD  Exam Information   Status Exam Begun  Exam Ended   Final [99] 10/12/2017 9:25 AM 10/12/2017 9:25 AM  PACS Images   Show images for DG Hand Complete Right  Study Result   CLINICAL DATA:  Pain after fall.  EXAM: RIGHT HAND - COMPLETE 3+ VIEW  COMPARISON:  None.  FINDINGS: Degenerative changes throughout the fingers. Degenerative changes at the base of the first metacarpal. Lucency in the scaphoid appears to be a bone cyst. No carpal bone fractures are noted on this study. No other fractures identified.  IMPRESSION: Degenerative changes in the hands and wrist with no identified fracture.   Electronically Signed   By: Dorise Bullion III M.D   On: 10/12/2017 09:31   DG Wrist Complete Right (Accession [id number]) (Order [id number])  Imaging  Date: 10/12/2017 Department: [facility] Released By: Lendon Collar, RT Authorizing: Chessie Neuharth, Wynelle Fanny, MD  Exam Information   Status Exam Begun  Exam Ended   Final [99] 10/12/2017 9:25 AM 10/12/2017 9:25 AM  PACS Images   Show images for DG Wrist Complete Right  Study Result   CLINICAL  DATA:  Pain after fall  EXAM: RIGHT WRIST - COMPLETE 3+ VIEW  COMPARISON:  None  FINDINGS: Degenerative changes in the wrist. No fractures noted. A cyst is seen in the scaphoid.  IMPRESSION: Degenerative changes.  No fracture identified.   Electronically Signed   By: Dorise Bullion III M.D   On: 10/12/2017 09:32    Patient Active Problem List   Diagnosis Date Noted  . Right hand pain 10/12/2017  . Right wrist pain 10/12/2017  . Fall involving furniture as cause of accidental injury 10/09/2017  . Coccydynia 10/09/2017  . Lobar pneumonia ([place]) 09/27/2017  . Diplopia 09/25/2017  . TIA (transient ischemic attack) 09/14/2017  . Fatigue 08/04/2017  . General weakness 08/04/2017  . Retinal vein occlusion, central 02/19/2014  . Chronic venous insufficiency 02/19/2014  . Episodic mood disorder ([place]) 12/20/2013  . Advanced directives, counseling/discussion 12/20/2013  . Routine general medical examination at a health care facility 11/02/2012  . ACTINIC KERATOSIS 11/18/2009  . Chronic kidney disease, stage III (moderate) ([place]) 01/26/2009  . Restless legs syndrome 10/03/2008  . OSTEOARTHRITIS 12/12/2006  . SPINAL STENOSIS, LUMBAR 06/24/2006  . Essential hypertension, benign 06/21/2006  . GERD 06/21/2006  . DIVERTICULOSIS, COLON 06/21/2006  . BPH with obstruction/lower urinary tract symptoms 06/21/2006   Past Medical History:  Diagnosis Date  . Actinic keratosis   . BPH (benign prostatic  hypertrophy)   . Diseases of lips   . Diverticulosis of colon 06/2003  . ED (erectile dysfunction)   . GERD (gastroesophageal reflux disease)   . Heart murmur    as child  . HLD (hyperlipidemia)   . HOH (hard of hearing)   . HTN (hypertension)   . Osteoarthritis of knee    severe, bilat  . Renal insufficiency   . Restless legs syndrome (RLS)   . Sleep disturbance, unspecified   . Spinal stenosis, lumbar region, without neurogenic claudication    Past Surgical History:    Procedure Laterality Date  . CATARACT EXTRACTION, BILATERAL  2011   Dr. Thomasene Ripple  . COCHLEAR IMPLANT Right 02/15/2017   Procedure: RIGHT COCHLEAR IMPLANT;  Surgeon: Vicie Mutters, MD;  Location: [place];  Service: ENT;  Laterality: Right;  . FINGER SURGERY     Right 4th finger  . [facility]  10/2009   Dr. Bary Castilla  . LUMBAR SPINE SURGERY  06/2005   stenosis repair  . TONSILLECTOMY  childhood  . TOTAL KNEE ARTHROPLASTY  06/2008   bilat (Dr. Marry Guan)   Social History   Tobacco Use  . Smoking status: Former Smoker    Packs/day: 0.75    Years: 30.00    Pack years: 22.50    Last attempt to quit: 02/28/1970    Years since quitting: 47.6  . Smokeless tobacco: Never Used  Substance Use Topics  . Alcohol use: Yes    Alcohol/week: 0.0 standard drinks    Comment: 1-2 glasses wine/daily  . Drug use: No   Family History  Problem Relation Age of Onset  . Other Father        "clogged arteries"  . Hypertension Mother    Allergies  Allergen Reactions  . Atorvastatin     REACTION: raises blood pressure  . Pravastatin Sodium     REACTION: raises blood pressure  . Statins     REACTION: raises bp   Current Outpatient Medications on File Prior to Visit  Medication Sig Dispense Refill  . amLODipine (NORVASC) 5 MG tablet Take 1 tablet (5 mg total) by mouth daily. 90 tablet 3  . aspirin EC 81 MG EC tablet Take 1 tablet (81 mg total) by mouth daily. 30 tablet 1  . buPROPion (WELLBUTRIN XL) 150 MG 24 hr tablet TAKE ONE TABLET BY MOUTH EVERY DAY 90 tablet 3  . clonazePAM (KLONOPIN) 0.5 MG tablet TAKE 1 OR 2 TABLETS BY MOUTH AT BEDTIME (Patient taking differently: Take 0.5-0.75 mg by mouth at bedtime as needed for sleep.) 180 tablet 0  . finasteride (PROSCAR) 5 MG tablet TAKE ONE TABLET BY MOUTH EVERY DAY 90 tablet 3  . losartan-hydrochlorothiazide (HYZAAR) 100-25 MG tablet TAKE ONE TABLET BY MOUTH EVERY DAY 90 tablet 3  . Multiple Vitamin (MULTIVITAMIN) capsule Take 1 capsule by mouth  daily.      Marland Kitchen omeprazole (PRILOSEC) 40 MG capsule TAKE ONE CAPSULE BY MOUTH EVERY DAY 90 capsule 3  . pramipexole (MIRAPEX) 1 MG tablet TAKE 1 OR 2 TABLETS AT BEDTIME AS NEEDED (Patient taking differently: Take 1 mg by mouth at dinner and 1 mg by mouth at bedtime.) 180 tablet 3  . pyridoxine (B-6) 100 MG tablet Take 100 mg by mouth daily.    . tamsulosin (FLOMAX) 0.4 MG CAPS capsule TAKE ONE CAPSULE BY MOUTH EVERY DAY 90 capsule 3  . [DISCONTINUED] citalopram (CELEXA) 10 MG tablet Take 1 tablet (10 mg total) by mouth daily. 30 tablet 5  No current facility-administered medications on file prior to visit.     Review of Systems  Constitutional: Positive for fatigue. Negative for activity change, appetite change, fever and unexpected weight change.  HENT: Negative for congestion, rhinorrhea, sore throat and trouble swallowing.   Eyes: Negative for pain, redness, itching and visual disturbance.  Respiratory: Negative for cough, chest tightness, shortness of breath and wheezing.   Cardiovascular: Negative for chest pain and palpitations.  Gastrointestinal: Negative for abdominal pain, blood in stool, constipation, diarrhea and nausea.  Endocrine: Negative for cold intolerance, heat intolerance, polydipsia and polyuria.  Genitourinary: Negative for difficulty urinating, dysuria, frequency and urgency.  Musculoskeletal: Negative for arthralgias, joint swelling and myalgias.       R hand pain and swelling   Skin: Negative for pallor and rash.  Neurological: Negative for dizziness, tremors, weakness, numbness and headaches.  Hematological: Negative for adenopathy. Does not bruise/bleed easily.  Psychiatric/Behavioral: Negative for decreased concentration and dysphoric mood. The patient is not nervous/anxious.        Objective:   Physical Exam  Constitutional: He appears well-developed and well-nourished. No distress.  Frail appearing elderly male   HENT:  Head: Normocephalic and atraumatic.    Mouth/Throat: Oropharynx is clear and moist.  Eyes: Pupils are equal, round, and reactive to light. Conjunctivae and EOM are normal.  Neck: Normal range of motion. Neck supple.  Cardiovascular: Normal rate, regular rhythm and normal heart sounds.  Pulmonary/Chest: Effort normal and breath sounds normal. No respiratory distress. He has no wheezes. He has no rales.  Harsh bs No rales or rhonchi Good air exch w/o wheezing   Musculoskeletal:       Right hand: He exhibits decreased range of motion, tenderness, bony tenderness and swelling. He exhibits normal two-point discrimination, normal capillary refill, no deformity and no laceration. Normal sensation noted. Normal strength noted.  Hand is markedly swollen  Sensation intact and well perfused Tender over anterior wrist-worse medially  No tenderness in snuffbox area Mild tenderness over 5th metacarpal   No joint or finger tenderness No focal ecchymosis   Pain with flexion and ext of wrist  Unable to make a fist due to swelling   Nl appearing forearm and elbow with nl rom and no tenderness     Lymphadenopathy:    He has no cervical adenopathy.  Neurological: He is alert. He displays normal reflexes.  Skin: Skin is warm and dry. No erythema.  Psychiatric: He has a normal mood and affect.          Assessment & Plan:   Problem List Items Addressed This Visit      Respiratory   Lobar pneumonia ([place])    Clinically improved         Other   Right hand pain - Primary    Right hand and wrist pain with swelling and limited rom from injuries sustained from fall on 10/08/17  Swelling gradually worsened-he has not elevated the hand  Disc elevation/ice Val Riles care/relative rest  (he declines need for pain medication)  xrays are reassuring w/o fracture  May have soft tissue/ligement/tendon injury  Wrapped with ace bandage -slight compression (pt thinks he can do this at home)  Update if not starting to improve in a week or if  worsening  May require re eval once swelling improves  Pt voiced understanding  Also disc fall precautions       Relevant Orders   DG Hand Complete Right (Completed)   DG Wrist Complete Right (Completed)  Right wrist pain   Relevant Orders   DG Hand Complete Right (Completed)   DG Wrist Complete Right (Completed)

## 2017-10-12 NOTE — Assessment & Plan Note (Signed)
Right hand and wrist pain with swelling and limited rom from injuries sustained from fall on 10/08/17  Swelling gradually worsened-he has not elevated the hand  Disc elevation/ice Val Riles care/relative rest  (he declines need for pain medication)  xrays are reassuring w/o fracture  May have soft tissue/ligement/tendon injury  Wrapped with ace bandage -slight compression (pt thinks he can do this at home)  Update if not starting to improve in a week or if worsening  May require re eval once swelling improves  Pt voiced understanding  Also disc fall precautions

## 2017-10-12 NOTE — Patient Instructions (Signed)
Use ice/cold compress whenever you can  Elevate also - it helps swelling   An ace bandage may help stabilize wrist and hand  Xray now

## 2017-10-16 ENCOUNTER — Other Ambulatory Visit: Payer: Self-pay | Admitting: Internal Medicine

## 2017-10-23 DIAGNOSIS — H34832 Tributary (branch) retinal vein occlusion, left eye, with macular edema: Secondary | ICD-10-CM | POA: Diagnosis not present

## 2017-10-24 DIAGNOSIS — W08XXXD Fall from other furniture, subsequent encounter: Secondary | ICD-10-CM | POA: Diagnosis not present

## 2017-10-24 DIAGNOSIS — M6281 Muscle weakness (generalized): Secondary | ICD-10-CM | POA: Diagnosis not present

## 2017-10-24 DIAGNOSIS — M533 Sacrococcygeal disorders, not elsewhere classified: Secondary | ICD-10-CM | POA: Diagnosis not present

## 2017-10-27 ENCOUNTER — Inpatient Hospital Stay: Payer: BLUE CROSS/BLUE SHIELD | Admitting: Internal Medicine

## 2017-11-01 ENCOUNTER — Ambulatory Visit (INDEPENDENT_AMBULATORY_CARE_PROVIDER_SITE_OTHER)
Admission: RE | Admit: 2017-11-01 | Discharge: 2017-11-01 | Disposition: A | Discharge status: Home / Self Care | Payer: Medicare Other | Source: Ambulatory Visit | Attending: Internal Medicine | Admitting: Internal Medicine

## 2017-11-01 ENCOUNTER — Encounter: Payer: Self-pay | Admitting: Internal Medicine

## 2017-11-01 ENCOUNTER — Ambulatory Visit (INDEPENDENT_AMBULATORY_CARE_PROVIDER_SITE_OTHER): Payer: Medicare Other | Admitting: Internal Medicine

## 2017-11-01 VITALS — BP 132/80 | HR 83 | Temp 97.8°F | Ht 66.0 in | Wt 180.0 lb

## 2017-11-01 DIAGNOSIS — J181 Lobar pneumonia, unspecified organism: Secondary | ICD-10-CM

## 2017-11-01 DIAGNOSIS — Z0389 Encounter for observation for other suspected diseases and conditions ruled out: Secondary | ICD-10-CM | POA: Diagnosis not present

## 2017-11-01 NOTE — Assessment & Plan Note (Signed)
Seems clinically resolved Lung exam not completely normal I suspect he may have some residual scarring---will recheck CXR

## 2017-11-01 NOTE — Progress Notes (Signed)
Subjective:    Patient ID: Stephen Garrett, male    DOB: [birth year], 84 y.o.   MRN: [id number]  HPI Here for follow up of pneumonia  He feels much better No cough Breathing is back to normal Has restarted with tennis  Current Outpatient Medications on File Prior to Visit  Medication Sig Dispense Refill  . amLODipine (NORVASC) 5 MG tablet Take 1 tablet (5 mg total) by mouth daily. 90 tablet 3  . aspirin EC 81 MG EC tablet Take 1 tablet (81 mg total) by mouth daily. 30 tablet 1  . buPROPion (WELLBUTRIN XL) 150 MG 24 hr tablet TAKE ONE TABLET BY MOUTH EVERY DAY 90 tablet 3  . clonazePAM (KLONOPIN) 0.5 MG tablet TAKE 1 OR 2 TABLETS BY MOUTH AT BEDTIME (Patient taking differently: Take 0.5-0.75 mg by mouth at bedtime as needed for sleep.) 180 tablet 0  . finasteride (PROSCAR) 5 MG tablet TAKE ONE TABLET BY MOUTH EVERY DAY 90 tablet 3  . Glucosamine Sulfate 1000 MG CAPS Take by mouth.    . losartan-hydrochlorothiazide (HYZAAR) 100-25 MG tablet TAKE ONE TABLET BY MOUTH EVERY DAY 90 tablet 3  . Multiple Vitamin (MULTIVITAMIN) capsule Take 1 capsule by mouth daily.      Marland Kitchen omeprazole (PRILOSEC) 40 MG capsule TAKE ONE CAPSULE BY MOUTH EVERY DAY 90 capsule 3  . polyethylene glycol (MIRALAX / GLYCOLAX) packet Take 17 g by mouth daily.    . pramipexole (MIRAPEX) 1 MG tablet Take 1 mg by mouth at dinner and 1 mg by mouth at bedtime. 180 tablet 3  . pyridoxine (B-6) 100 MG tablet Take 100 mg by mouth daily.    Marland Kitchen senna (SENOKOT) 8.6 MG TABS tablet Take 1 tablet by mouth.    . tamsulosin (FLOMAX) 0.4 MG CAPS capsule TAKE ONE CAPSULE BY MOUTH EVERY DAY 90 capsule 3  . [DISCONTINUED] citalopram (CELEXA) 10 MG tablet Take 1 tablet (10 mg total) by mouth daily. 30 tablet 5   No current facility-administered medications on file prior to visit.     Allergies  Allergen Reactions  . Atorvastatin     REACTION: raises blood pressure  . Pravastatin Sodium     REACTION: raises blood pressure  . Statins     REACTION: raises bp    Past Medical History:  Diagnosis Date  . Actinic keratosis   . BPH (benign prostatic hypertrophy)   . Diseases of lips   . Diverticulosis of colon 06/2003  . ED (erectile dysfunction)   . GERD (gastroesophageal reflux disease)   . Heart murmur    as child  . HLD (hyperlipidemia)   . HOH (hard of hearing)   . HTN (hypertension)   . Osteoarthritis of knee    severe, bilat  . Renal insufficiency   . Restless legs syndrome (RLS)   . Sleep disturbance, unspecified   . Spinal stenosis, lumbar region, without neurogenic claudication     Past Surgical History:  Procedure Laterality Date  . CATARACT EXTRACTION, BILATERAL  2011   Dr. Thomasene Ripple  . COCHLEAR IMPLANT Right 02/15/2017   Procedure: RIGHT COCHLEAR IMPLANT;  Surgeon: Vicie Mutters, MD;  Location: [facility];  Service: ENT;  Laterality: Right;  . FINGER SURGERY     Right 4th finger  . [facility]  10/2009   Dr. Bary Castilla  . LUMBAR SPINE SURGERY  06/2005   stenosis repair  . TONSILLECTOMY  childhood  . TOTAL KNEE ARTHROPLASTY  06/2008   bilat (Dr. Marry Guan)  Family History  Problem Relation Age of Onset  . Other Father        "clogged arteries"  . Hypertension Mother     Social History   Socioeconomic History  . Marital status: Widowed    Spouse name: Not on file  . Number of children: 2  . Years of education: Not on file  . Highest education level: Not on file  Occupational History  . Occupation: Retired  Scientific laboratory technician  . Financial resource strain: Not on file  . Food insecurity:    Worry: Not on file    Inability: Not on file  . Transportation needs:    Medical: Not on file    Non-medical: Not on file  Tobacco Use  . Smoking status: Former Smoker    Packs/day: 0.75    Years: 30.00    Pack years: 22.50    Last attempt to quit: 02/28/1970    Years since quitting: 47.7  . Smokeless tobacco: Never Used  Substance and Sexual Activity  . Alcohol use: Yes    Alcohol/week: 0.0  standard drinks    Comment: 1-2 glasses wine/daily  . Drug use: No  . Sexual activity: Not on file  Lifestyle  . Physical activity:    Days per week: Not on file    Minutes per session: Not on file  . Stress: Not on file  Relationships  . Social connections:    Talks on phone: Not on file    Gets together: Not on file    Attends religious service: Not on file    Active member of club or organization: Not on file    Attends meetings of clubs or organizations: Not on file    Relationship status: Not on file  . Intimate partner violence:    Fear of current or ex partner: Not on file    Emotionally abused: Not on file    Physically abused: Not on file    Forced sexual activity: Not on file  Other Topics Concern  . Not on file  Social History Narrative   Widowed 2010   2 adopted children   Has living will   Son is health care power of attorney   Would accept resuscitation but no prolonged ventilation   No tube feeds if cognitively unaware   Review of Systems  No fever Appetite is good Sleeping well Still some pain in coccyx from fall     Objective:   Physical Exam  Cardiovascular: Normal rate and regular rhythm. Exam reveals no gallop.  Soft aortic systolic murmur  Respiratory: Effort normal. He has no wheezes. He has no rales.  Slightly decreased breath sounds and scattered inspiratory rhonchi  Musculoskeletal: He exhibits no edema.           Assessment & Plan:

## 2017-11-06 DIAGNOSIS — H903 Sensorineural hearing loss, bilateral: Secondary | ICD-10-CM | POA: Diagnosis not present

## 2017-11-09 ENCOUNTER — Other Ambulatory Visit: Payer: Self-pay | Admitting: Internal Medicine

## 2017-11-09 MED ORDER — CLONAZEPAM 0.5 MG PO TABS: 0.5000 mg | ORAL_TABLET | Freq: Every day | ORAL | 0 refills | Status: DC

## 2017-11-09 NOTE — Telephone Encounter (Signed)
Last Rx 06/28/2017 #180. Last OV 11/01/2017-acute

## 2017-11-09 NOTE — Telephone Encounter (Signed)
Copied from [place] [id number]. Topic: Quick Communication - Rx Refill/Question >> Nov 09, 2017  8:10 AM Marval Regal L wrote: Medication: clonazePAM (KLONOPIN) 0.5 MG tablet [[id number]]   Has the patient contacted their pharmacy? No Preferred Pharmacy (with phone number or street name):[facility] [place], [place] - [place] [phone] (Phone) [phone] (Fax)   Agent: Please be advised that RX refills may take up to 3 business days. We ask that you follow-up with your pharmacy.

## 2017-11-13 DIAGNOSIS — M533 Sacrococcygeal disorders, not elsewhere classified: Secondary | ICD-10-CM | POA: Diagnosis not present

## 2017-11-13 DIAGNOSIS — W08XXXD Fall from other furniture, subsequent encounter: Secondary | ICD-10-CM | POA: Diagnosis not present

## 2017-11-13 DIAGNOSIS — M6281 Muscle weakness (generalized): Secondary | ICD-10-CM | POA: Diagnosis not present

## 2017-11-22 ENCOUNTER — Telehealth: Payer: Self-pay | Admitting: Internal Medicine

## 2017-11-22 MED ORDER — AMLODIPINE BESYLATE 5 MG PO TABS: 5.0000 mg | ORAL_TABLET | Freq: Every day | ORAL | 3 refills | Status: DC

## 2017-11-22 MED ORDER — FINASTERIDE 5 MG PO TABS: 5.0000 mg | ORAL_TABLET | Freq: Every day | ORAL | 3 refills | Status: DC

## 2017-11-22 NOTE — Telephone Encounter (Signed)
Copied from [place] [id number]. Topic: Quick Communication - Rx Refill/Question >> Nov 22, 2017 10:24 AM Antonieta Iba C wrote: Medication: finasteride (PROSCAR) 5 MG tablet and also amLODipine (NORVASC) 5 MG tablet  Has the patient contacted their pharmacy? Yes  (Agent: If no, request that the patient contact the pharmacy for the refill.) (Agent: If yes, when and what did the pharmacy advise?)  Preferred Pharmacy (with phone number or street name): [place], [place] - [place] [phone] (Phone) [phone] (Fax)    Agent: Please be advised that RX refills may take up to 3 business days. We ask that you follow-up with your pharmacy.

## 2017-11-22 NOTE — Telephone Encounter (Signed)
Proscar 5mg refill Last Refill: # 05/29/17 Last OV: PCP: [facility] [place]  norvasc 5mg  refill Last Refill:07/2016 # 90 Last OV: 06/06/17/19 PCP: Silvio Pate Pharmacy:same as above

## 2017-11-23 ENCOUNTER — Telehealth: Payer: Self-pay

## 2017-11-23 DIAGNOSIS — H903 Sensorineural hearing loss, bilateral: Secondary | ICD-10-CM | POA: Diagnosis not present

## 2017-11-23 NOTE — Telephone Encounter (Signed)
Let him know that he doesn't need any further pneumonia vaccines

## 2017-11-23 NOTE — Telephone Encounter (Signed)
Spoke to pt

## 2017-11-23 NOTE — Telephone Encounter (Signed)
Pt last seen 11/01/17 with pneumonia; last prevnar 17 ws 12/20/13 and last pneumovax given on 12/09/16.

## 2017-11-23 NOTE — Telephone Encounter (Signed)
Copied from [id number]. Topic: Inquiry >> Nov 23, 2017 12:16 PM Reyne Dumas L wrote: Reason for CRM:   Pt wants to know if it would be recommend that he get the Pneumonia vaccination.

## 2017-12-19 DIAGNOSIS — H34832 Tributary (branch) retinal vein occlusion, left eye, with macular edema: Secondary | ICD-10-CM | POA: Diagnosis not present

## 2017-12-22 DIAGNOSIS — H903 Sensorineural hearing loss, bilateral: Secondary | ICD-10-CM | POA: Diagnosis not present

## 2017-12-27 DIAGNOSIS — Z23 Encounter for immunization: Secondary | ICD-10-CM | POA: Diagnosis not present

## 2018-01-03 DIAGNOSIS — H903 Sensorineural hearing loss, bilateral: Secondary | ICD-10-CM | POA: Diagnosis not present

## 2018-01-08 ENCOUNTER — Other Ambulatory Visit: Payer: Self-pay | Admitting: Internal Medicine

## 2018-01-22 DIAGNOSIS — H903 Sensorineural hearing loss, bilateral: Secondary | ICD-10-CM | POA: Diagnosis not present

## 2018-01-24 DIAGNOSIS — D485 Neoplasm of uncertain behavior of skin: Secondary | ICD-10-CM | POA: Diagnosis not present

## 2018-01-24 DIAGNOSIS — D2271 Melanocytic nevi of right lower limb, including hip: Secondary | ICD-10-CM | POA: Diagnosis not present

## 2018-01-24 DIAGNOSIS — C44729 Squamous cell carcinoma of skin of left lower limb, including hip: Secondary | ICD-10-CM | POA: Diagnosis not present

## 2018-01-24 DIAGNOSIS — D0421 Carcinoma in situ of skin of right ear and external auricular canal: Secondary | ICD-10-CM | POA: Diagnosis not present

## 2018-01-24 DIAGNOSIS — Z85828 Personal history of other malignant neoplasm of skin: Secondary | ICD-10-CM | POA: Diagnosis not present

## 2018-01-24 DIAGNOSIS — D2272 Melanocytic nevi of left lower limb, including hip: Secondary | ICD-10-CM | POA: Diagnosis not present

## 2018-01-24 DIAGNOSIS — Z08 Encounter for follow-up examination after completed treatment for malignant neoplasm: Secondary | ICD-10-CM | POA: Diagnosis not present

## 2018-01-24 DIAGNOSIS — Z85820 Personal history of malignant melanoma of skin: Secondary | ICD-10-CM | POA: Diagnosis not present

## 2018-01-24 DIAGNOSIS — D225 Melanocytic nevi of trunk: Secondary | ICD-10-CM | POA: Diagnosis not present

## 2018-01-24 DIAGNOSIS — D2262 Melanocytic nevi of left upper limb, including shoulder: Secondary | ICD-10-CM | POA: Diagnosis not present

## 2018-01-24 DIAGNOSIS — L821 Other seborrheic keratosis: Secondary | ICD-10-CM | POA: Diagnosis not present

## 2018-01-24 DIAGNOSIS — D2261 Melanocytic nevi of right upper limb, including shoulder: Secondary | ICD-10-CM | POA: Diagnosis not present

## 2018-01-30 DIAGNOSIS — H34832 Tributary (branch) retinal vein occlusion, left eye, with macular edema: Secondary | ICD-10-CM | POA: Diagnosis not present

## 2018-02-05 ENCOUNTER — Telehealth: Payer: Self-pay | Admitting: Internal Medicine

## 2018-02-05 NOTE — Telephone Encounter (Signed)
Best number [phone]  Pt called to schedule to get new shingrix vaccine

## 2018-02-05 NOTE — Telephone Encounter (Signed)
Insurance will not cover for patient's over 65 to have shingrix administered by our office.    Please let patient know that he will need to go to a pharmacy to have his insurance cover because this benefit is covered under his part D medicare prescription coverage.   Thank you!

## 2018-02-05 NOTE — Telephone Encounter (Signed)
Pt aware.

## 2018-02-07 DIAGNOSIS — H903 Sensorineural hearing loss, bilateral: Secondary | ICD-10-CM | POA: Diagnosis not present

## 2018-02-14 DIAGNOSIS — D0421 Carcinoma in situ of skin of right ear and external auricular canal: Secondary | ICD-10-CM | POA: Diagnosis not present

## 2018-02-15 DIAGNOSIS — H02403 Unspecified ptosis of bilateral eyelids: Secondary | ICD-10-CM | POA: Diagnosis not present

## 2018-02-19 ENCOUNTER — Other Ambulatory Visit: Payer: Self-pay | Admitting: Internal Medicine

## 2018-02-19 NOTE — Telephone Encounter (Signed)
Last filled 11-09-17 #180 Last OV 11-01-17 Acute Next OV 06-12-18 [facility]

## 2018-02-22 DIAGNOSIS — C44729 Squamous cell carcinoma of skin of left lower limb, including hip: Secondary | ICD-10-CM | POA: Diagnosis not present

## 2018-03-06 DIAGNOSIS — H903 Sensorineural hearing loss, bilateral: Secondary | ICD-10-CM | POA: Diagnosis not present

## 2018-03-26 DIAGNOSIS — H34832 Tributary (branch) retinal vein occlusion, left eye, with macular edema: Secondary | ICD-10-CM | POA: Diagnosis not present

## 2018-03-29 ENCOUNTER — Ambulatory Visit: Payer: Self-pay

## 2018-03-29 NOTE — Telephone Encounter (Signed)
Noted, will evaluate. 

## 2018-03-29 NOTE — Telephone Encounter (Signed)
This is a common complaint for him---I generally never find anything of concern, but you never know. Should be fine to wait for tomorrow

## 2018-03-29 NOTE — Telephone Encounter (Signed)
Patient called in with c/o "weakness." He says "I've been feeling this way since either Sunday or Monday, just weak all over. I also taste Vaseline in my mouth and throat. I was putting Vaseline on my legs and somehow I think it may have gotten in my mouth, because I taste it. That's about when the weakness started. I called the pharmacist and was told not to worry about it, but since the weakness is not getting better, I thought I should come in tomorrow." I asked about other symptoms, he denies chest pain, chest discomfort, fever, cough, SOB, vomiting, diarrhea, nausea, pain, bleeding. According to protocol, see PCP within 3 days, no availability with PCP, appointment scheduled for tomorrow, 03/30/18 at 0800 with Allie Bossier, NP, care advice given, patient verbalized understanding.   Reason for Disposition . [1] MILD weakness (i.e., does not interfere with ability to work, go to school, normal activities) AND [2] persists > 1 week  Answer Assessment - Initial Assessment Questions 1. DESCRIPTION: "Describe how you are feeling."     Weakness all over 2. SEVERITY: "How bad is it?"  "Can you stand and walk?"   - MILD - Feels weak or tired, but does not interfere with work, school or normal activities   - [place] to stand and walk; weakness interferes with work, school, or normal activities   - SEVERE - Unable to stand or walk     Mild 3. ONSET:  "When did the weakness begin?"     Earlier this week, either Sunday or Monday 4. CAUSE: "What do you think is causing the weakness?"     I don't know, I may have swallowed some vaseline, taste of vaseline in mouth and throat 5. MEDICINES: "Have you recently started a new medicine or had a change in the amount of a medicine?"     No 6. OTHER SYMPTOMS: "Do you have any other symptoms?" (e.g., chest pain, fever, cough, SOB, vomiting, diarrhea, bleeding, other areas of pain)     No 7. PREGNANCY: "Is there any chance you are pregnant?" "When was your  last menstrual period?"     N/A  Protocols used: WEAKNESS (GENERALIZED) AND FATIGUE-A-AH

## 2018-03-30 ENCOUNTER — Encounter: Payer: Self-pay | Admitting: Primary Care

## 2018-03-30 ENCOUNTER — Ambulatory Visit (INDEPENDENT_AMBULATORY_CARE_PROVIDER_SITE_OTHER): Payer: Medicare Other | Admitting: Primary Care

## 2018-03-30 ENCOUNTER — Ambulatory Visit: Payer: Medicare Other | Admitting: Primary Care

## 2018-03-30 DIAGNOSIS — R531 Weakness: Secondary | ICD-10-CM | POA: Diagnosis not present

## 2018-03-30 NOTE — Progress Notes (Signed)
Subjective:    Patient ID: Stephen Garrett, male    DOB: 1923-12-24, 85 y.o.   MRN: [id number]  HPI  Stephen Garrett is a 85 year old male with a history of fatigue, hypertension, TIA, CKD, weakness, mood disorder, anemia who presents today with a chief complaint of weakness.  Two weeks ago he played two strenuous games tennis (Monday and Wednesday) which lasted for 2 hours each game. He left the court that Wednesday feeling very extraordinarily tired, muscles felt tired. About 3-4 days ago he slept for 14 hours straight as he felt tired.  He is feeling stronger this morning and near back to base line.   BP Readings from Last 3 Encounters:  03/30/18 140/74  11/01/17 132/80  10/12/17 118/74   He denies cough, fevers, nausea, headaches. He's had slight dizziness that is chronic for him, also slight fatigue. Fatigue has improved.   Review of Systems  Constitutional: Positive for fatigue. Negative for fever.  HENT: Negative for congestion and rhinorrhea.   Respiratory: Negative for shortness of breath.   Cardiovascular: Negative for chest pain.  Neurological: Negative for headaches.       Past Medical History:  Diagnosis Date  . Actinic keratosis   . BPH (benign prostatic hypertrophy)   . Diseases of lips   . Diverticulosis of colon 06/2003  . ED (erectile dysfunction)   . GERD (gastroesophageal reflux disease)   . Heart murmur    as child  . HLD (hyperlipidemia)   . HOH (hard of hearing)   . HTN (hypertension)   . Osteoarthritis of knee    severe, bilat  . Renal insufficiency   . Restless legs syndrome (RLS)   . Sleep disturbance, unspecified   . Spinal stenosis, lumbar region, without neurogenic claudication      Social History   Socioeconomic History  . Marital status: Widowed    Spouse name: Not on file  . Number of children: 2  . Years of education: Not on file  . Highest education level: Not on file  Occupational History  . Occupation: Retired  Scientific laboratory technician  .  Financial resource strain: Not on file  . Food insecurity:    Worry: Not on file    Inability: Not on file  . Transportation needs:    Medical: Not on file    Non-medical: Not on file  Tobacco Use  . Smoking status: Former Smoker    Packs/day: 0.75    Years: 30.00    Pack years: 22.50    Last attempt to quit: 02/28/1970    Years since quitting: 48.1  . Smokeless tobacco: Never Used  Substance and Sexual Activity  . Alcohol use: Yes    Alcohol/week: 0.0 standard drinks    Comment: 1-2 glasses wine/daily  . Drug use: No  . Sexual activity: Not on file  Lifestyle  . Physical activity:    Days per week: Not on file    Minutes per session: Not on file  . Stress: Not on file  Relationships  . Social connections:    Talks on phone: Not on file    Gets together: Not on file    Attends religious service: Not on file    Active member of club or organization: Not on file    Attends meetings of clubs or organizations: Not on file    Relationship status: Not on file  . Intimate partner violence:    Fear of current or ex partner: Not on file  Emotionally abused: Not on file    Physically abused: Not on file    Forced sexual activity: Not on file  Other Topics Concern  . Not on file  Social History Narrative   Widowed 2010   2 adopted children   Has living will   Son is health care power of attorney   Would accept resuscitation but no prolonged ventilation   No tube feeds if cognitively unaware    Past Surgical History:  Procedure Laterality Date  . CATARACT EXTRACTION, BILATERAL  2011   Dr. Thomasene Ripple  . COCHLEAR IMPLANT Right 02/15/2017   Procedure: RIGHT COCHLEAR IMPLANT;  Surgeon: Vicie Mutters, MD;  Location: [facility];  Service: ENT;  Laterality: Right;  . FINGER SURGERY     Right 4th finger  . [facility]  10/2009   Dr. Bary Castilla  . LUMBAR SPINE SURGERY  06/2005   stenosis repair  . TONSILLECTOMY  childhood  . TOTAL KNEE ARTHROPLASTY  06/2008   bilat (Dr.  Marry Guan)    Family History  Problem Relation Age of Onset  . Other Father        "clogged arteries"  . Hypertension Mother     Allergies  Allergen Reactions  . Atorvastatin     REACTION: raises blood pressure  . Pravastatin Sodium     REACTION: raises blood pressure  . Statins     REACTION: raises bp    Current Outpatient Medications on File Prior to Visit  Medication Sig Dispense Refill  . amLODipine (NORVASC) 5 MG tablet Take 1 tablet (5 mg total) by mouth daily. 90 tablet 3  . aspirin EC 81 MG EC tablet Take 1 tablet (81 mg total) by mouth daily. 30 tablet 1  . buPROPion (WELLBUTRIN XL) 150 MG 24 hr tablet TAKE ONE TABLET BY MOUTH EVERY DAY 90 tablet 3  . clonazePAM (KLONOPIN) 0.5 MG tablet TAKE 1-2 TABLETS BY MOUTH AT BEDTIME 180 tablet 0  . finasteride (PROSCAR) 5 MG tablet Take 1 tablet (5 mg total) by mouth daily. 90 tablet 3  . Glucosamine Sulfate 1000 MG CAPS Take by mouth.    . losartan-hydrochlorothiazide (HYZAAR) 100-25 MG tablet TAKE ONE TABLET BY MOUTH EVERY DAY 90 tablet 3  . Multiple Vitamin (MULTIVITAMIN) capsule Take 1 capsule by mouth daily.      Marland Kitchen omeprazole (PRILOSEC) 40 MG capsule TAKE ONE CAPSULE BY MOUTH EVERY DAY 90 capsule 3  . polyethylene glycol (MIRALAX / GLYCOLAX) packet Take 17 g by mouth daily.    . pramipexole (MIRAPEX) 1 MG tablet Take 1 mg by mouth at dinner and 1 mg by mouth at bedtime. 180 tablet 3  . pyridoxine (B-6) 100 MG tablet Take 100 mg by mouth daily.    Marland Kitchen senna (SENOKOT) 8.6 MG TABS tablet Take 1 tablet by mouth.    . tamsulosin (FLOMAX) 0.4 MG CAPS capsule TAKE ONE CAPSULE BY MOUTH EVERY DAY 90 capsule 3  . [DISCONTINUED] citalopram (CELEXA) 10 MG tablet Take 1 tablet (10 mg total) by mouth daily. 30 tablet 5   No current facility-administered medications on file prior to visit.     BP 140/74   Pulse 71   Temp 97.7 F (36.5 C) (Oral)   Ht 5\' 6"  (1.676 m)   Wt 180 lb 4 oz (81.8 kg)   SpO2 96%   BMI 29.09 kg/m     Objective:   Physical Exam  Constitutional: He is oriented to person, place, and time. He appears well-nourished.  HENT:  Mouth/Throat: No oropharyngeal exudate.  Eyes: Pupils are equal, round, and reactive to light. EOM are normal.  Neck: Neck supple.  Cardiovascular: Normal rate and regular rhythm.  Murmur heard. Respiratory: Effort normal and breath sounds normal.  GI: Soft. Bowel sounds are normal. There is no abdominal tenderness.  Musculoskeletal: Normal range of motion.  Neurological: He is alert and oriented to person, place, and time. No cranial nerve deficit.  Reflex Scores:      Patellar reflexes are 2+ on the right side and 2+ on the left side. Skin: Skin is warm and dry.  Psychiatric: He has a normal mood and affect.           Assessment & Plan:

## 2018-03-30 NOTE — Patient Instructions (Signed)
Make sure to rest in between games of tennis.  Please call us if you start to feel worse.  It was a pleasure meeting you!

## 2018-03-30 NOTE — Assessment & Plan Note (Signed)
Suspect this was secondary to two strenuous games of tennis back-to-back. We did a thorough exam today which was grossly unremarkable.  Given that he's feeling better we will have him monitor symptoms and update [place] as needed. No need for vigorous lab/imaging work up. He appears well.  Discussed to pace himself during tennis games and allow for rest in between. Follow up PRN.

## 2018-05-08 DIAGNOSIS — M25571 Pain in right ankle and joints of right foot: Secondary | ICD-10-CM | POA: Diagnosis not present

## 2018-05-13 ENCOUNTER — Other Ambulatory Visit: Payer: Self-pay | Admitting: Internal Medicine

## 2018-05-15 DIAGNOSIS — H34832 Tributary (branch) retinal vein occlusion, left eye, with macular edema: Secondary | ICD-10-CM | POA: Diagnosis not present

## 2018-05-15 DIAGNOSIS — H43813 Vitreous degeneration, bilateral: Secondary | ICD-10-CM | POA: Diagnosis not present

## 2018-06-03 ENCOUNTER — Other Ambulatory Visit: Payer: Self-pay | Admitting: Internal Medicine

## 2018-06-04 NOTE — Telephone Encounter (Signed)
Last filled 02-19-18 #180 Last OV 03-30-18 Next OV 06-12-18 [facility]

## 2018-06-12 ENCOUNTER — Encounter: Payer: Medicare Other | Admitting: Internal Medicine

## 2018-06-30 ENCOUNTER — Other Ambulatory Visit: Payer: Self-pay | Admitting: Internal Medicine

## 2018-07-03 ENCOUNTER — Encounter: Admission: RE | Payer: Self-pay | Source: Home / Self Care

## 2018-07-03 ENCOUNTER — Ambulatory Visit: Admission: RE | Admit: 2018-07-03 | Payer: Medicare Other | Source: Home / Self Care | Admitting: Ophthalmology

## 2018-07-03 SURGERY — REPAIR, BLEPHAROPTOSIS
Anesthesia: Monitor Anesthesia Care | Laterality: Bilateral

## 2018-07-09 DIAGNOSIS — H34832 Tributary (branch) retinal vein occlusion, left eye, with macular edema: Secondary | ICD-10-CM | POA: Diagnosis not present

## 2018-07-11 DIAGNOSIS — L814 Other melanin hyperpigmentation: Secondary | ICD-10-CM | POA: Diagnosis not present

## 2018-07-25 DIAGNOSIS — Z45321 Encounter for adjustment and management of cochlear device: Secondary | ICD-10-CM | POA: Diagnosis not present

## 2018-07-25 DIAGNOSIS — H903 Sensorineural hearing loss, bilateral: Secondary | ICD-10-CM | POA: Diagnosis not present

## 2018-08-06 ENCOUNTER — Other Ambulatory Visit: Payer: Self-pay | Admitting: Internal Medicine

## 2018-08-21 DIAGNOSIS — D485 Neoplasm of uncertain behavior of skin: Secondary | ICD-10-CM | POA: Diagnosis not present

## 2018-08-21 DIAGNOSIS — L821 Other seborrheic keratosis: Secondary | ICD-10-CM | POA: Diagnosis not present

## 2018-08-21 DIAGNOSIS — Z85828 Personal history of other malignant neoplasm of skin: Secondary | ICD-10-CM | POA: Diagnosis not present

## 2018-08-21 DIAGNOSIS — X32XXXA Exposure to sunlight, initial encounter: Secondary | ICD-10-CM | POA: Diagnosis not present

## 2018-08-21 DIAGNOSIS — C44729 Squamous cell carcinoma of skin of left lower limb, including hip: Secondary | ICD-10-CM | POA: Diagnosis not present

## 2018-08-21 DIAGNOSIS — L570 Actinic keratosis: Secondary | ICD-10-CM | POA: Diagnosis not present

## 2018-08-21 DIAGNOSIS — Z85820 Personal history of malignant melanoma of skin: Secondary | ICD-10-CM | POA: Diagnosis not present

## 2018-08-21 DIAGNOSIS — Z08 Encounter for follow-up examination after completed treatment for malignant neoplasm: Secondary | ICD-10-CM | POA: Diagnosis not present

## 2018-08-24 ENCOUNTER — Telehealth: Payer: Self-pay | Admitting: *Deleted

## 2018-08-24 NOTE — Telephone Encounter (Signed)
Patient notified as instructed by telephone and verbalized understanding. 

## 2018-08-24 NOTE — Telephone Encounter (Signed)
Not sure he should be driving given how he feels Have him take tylenol 650mg  up to 4 times a day Lots of fluids and rest Probably should do COVID testing--but I don't want him to drive Call 486 if SOB, etc

## 2018-08-24 NOTE — Telephone Encounter (Signed)
Spoke to patient by telephone and was advised that he went to his dermotologist today and they would not see him because he had a fever. Patient stated that he feels terrible, with body aches, headache, feels off balanced and upset stomach. Patient stated that he has not been around anyone with Covid that he knows off.

## 2018-08-25 DIAGNOSIS — Z03818 Encounter for observation for suspected exposure to other biological agents ruled out: Secondary | ICD-10-CM | POA: Diagnosis not present

## 2018-08-25 DIAGNOSIS — R509 Fever, unspecified: Secondary | ICD-10-CM | POA: Diagnosis not present

## 2018-08-25 DIAGNOSIS — B9689 Other specified bacterial agents as the cause of diseases classified elsewhere: Secondary | ICD-10-CM | POA: Diagnosis not present

## 2018-08-25 DIAGNOSIS — J019 Acute sinusitis, unspecified: Secondary | ICD-10-CM | POA: Diagnosis not present

## 2018-08-30 DIAGNOSIS — C44729 Squamous cell carcinoma of skin of left lower limb, including hip: Secondary | ICD-10-CM | POA: Diagnosis not present

## 2018-09-11 DIAGNOSIS — H34832 Tributary (branch) retinal vein occlusion, left eye, with macular edema: Secondary | ICD-10-CM | POA: Diagnosis not present

## 2018-09-16 ENCOUNTER — Other Ambulatory Visit: Payer: Self-pay | Admitting: Internal Medicine

## 2018-09-17 NOTE — Telephone Encounter (Signed)
Last filled 06-04-18 #180 Last OV 03-30-18  No Future OV [facility]

## 2018-09-17 NOTE — Telephone Encounter (Signed)
Please set him up for a wellness visit in the next few months

## 2018-09-18 DIAGNOSIS — H34832 Tributary (branch) retinal vein occlusion, left eye, with macular edema: Secondary | ICD-10-CM | POA: Diagnosis not present

## 2018-09-19 DIAGNOSIS — L570 Actinic keratosis: Secondary | ICD-10-CM | POA: Diagnosis not present

## 2018-09-19 DIAGNOSIS — D692 Other nonthrombocytopenic purpura: Secondary | ICD-10-CM | POA: Diagnosis not present

## 2018-09-21 NOTE — Telephone Encounter (Signed)
AWV scheduled 9/29

## 2018-09-21 NOTE — Telephone Encounter (Signed)
Left message on vm per dpr to call the office and schedule a medicare wellness in the next few months.

## 2018-09-27 DIAGNOSIS — L089 Local infection of the skin and subcutaneous tissue, unspecified: Secondary | ICD-10-CM | POA: Diagnosis not present

## 2018-09-27 DIAGNOSIS — S81802A Unspecified open wound, left lower leg, initial encounter: Secondary | ICD-10-CM | POA: Diagnosis not present

## 2018-09-27 DIAGNOSIS — S80812A Abrasion, left lower leg, initial encounter: Secondary | ICD-10-CM | POA: Diagnosis not present

## 2018-10-26 DIAGNOSIS — Z23 Encounter for immunization: Secondary | ICD-10-CM | POA: Diagnosis not present

## 2018-10-29 DIAGNOSIS — R29898 Other symptoms and signs involving the musculoskeletal system: Secondary | ICD-10-CM

## 2018-10-30 DIAGNOSIS — H43813 Vitreous degeneration, bilateral: Secondary | ICD-10-CM | POA: Diagnosis not present

## 2018-10-30 DIAGNOSIS — H34832 Tributary (branch) retinal vein occlusion, left eye, with macular edema: Secondary | ICD-10-CM | POA: Diagnosis not present

## 2018-10-31 NOTE — Telephone Encounter (Signed)
Please cancel the PT referral

## 2018-11-27 ENCOUNTER — Ambulatory Visit (INDEPENDENT_AMBULATORY_CARE_PROVIDER_SITE_OTHER): Payer: Medicare Other | Admitting: Internal Medicine

## 2018-11-27 ENCOUNTER — Other Ambulatory Visit: Payer: Self-pay

## 2018-11-27 ENCOUNTER — Encounter: Payer: Self-pay | Admitting: Internal Medicine

## 2018-11-27 ENCOUNTER — Telehealth: Payer: Self-pay | Admitting: *Deleted

## 2018-11-27 VITALS — BP 132/84 | HR 76 | Temp 97.7°F | Ht 66.0 in | Wt 179.0 lb

## 2018-11-27 DIAGNOSIS — I10 Essential (primary) hypertension: Secondary | ICD-10-CM | POA: Diagnosis not present

## 2018-11-27 DIAGNOSIS — Z7189 Other specified counseling: Secondary | ICD-10-CM

## 2018-11-27 DIAGNOSIS — I872 Venous insufficiency (chronic) (peripheral): Secondary | ICD-10-CM | POA: Diagnosis not present

## 2018-11-27 DIAGNOSIS — Z0000 Encounter for general adult medical examination without abnormal findings: Secondary | ICD-10-CM | POA: Diagnosis not present

## 2018-11-27 DIAGNOSIS — N183 Chronic kidney disease, stage 3 unspecified: Secondary | ICD-10-CM

## 2018-11-27 DIAGNOSIS — G2581 Restless legs syndrome: Secondary | ICD-10-CM

## 2018-11-27 DIAGNOSIS — F39 Unspecified mood [affective] disorder: Secondary | ICD-10-CM

## 2018-11-27 LAB — COMPREHENSIVE METABOLIC PANEL
ALT: 17 U/L (ref 0–53)
AST: 24 U/L (ref 0–37)
Albumin: 4.1 g/dL (ref 3.5–5.2)
Alkaline Phosphatase: 88 U/L (ref 39–117)
BUN: 27 mg/dL — ABNORMAL HIGH (ref 6–23)
CO2: 29 mEq/L (ref 19–32)
Calcium: 9.8 mg/dL (ref 8.4–10.5)
Chloride: 102 mEq/L (ref 96–112)
Creatinine, Ser: 1.44 mg/dL (ref 0.40–1.50)
GFR: 45.76 mL/min — ABNORMAL LOW (ref >60.00)
Glucose, Bld: 102 mg/dL — ABNORMAL HIGH (ref 70–99)
Potassium: 3.7 mEq/L (ref 3.5–5.1)
Sodium: 140 mEq/L (ref 135–145)
Total Bilirubin: 0.6 mg/dL (ref 0.2–1.2)
Total Protein: 6.3 g/dL (ref 6.0–8.3)

## 2018-11-27 LAB — CBC
HCT: 39 % (ref 39.0–52.0)
Hemoglobin: 13 g/dL (ref 13.0–17.0)
MCHC: 33.4 g/dL (ref 30.0–36.0)
MCV: 91.4 fl (ref 78.0–100.0)
Platelets: 187 10*3/uL (ref 150.0–400.0)
RBC: 4.27 Mil/uL (ref 4.22–5.81)
RDW: 14.4 % (ref 11.5–15.5)
WBC: 5.8 10*3/uL (ref 4.0–10.5)

## 2018-11-27 LAB — T4, FREE: Free T4: 0.75 ng/dL (ref 0.60–1.60)

## 2018-11-27 NOTE — Assessment & Plan Note (Signed)
BP Readings from Last 3 Encounters:  11/27/18 132/84  03/30/18 140/74  11/01/17 132/80   Good control Due for labs

## 2018-11-27 NOTE — Progress Notes (Signed)
Subjective:    Patient ID: Stephen Garrett, male    DOB: March 19, 1923, 85 y.o.   MRN: [id number]  HPI Here for Medicare wellness visit and follow up of chronic health conditions Reviewed form and advanced directives Reviewed other doctors Ongoing poor hearing--has cochlear implant still Did have a fall ---slipped in wet weather. Mild injuries. Only fall Occasional glass of wine or whiskey sour No tobacco Continues to exercise Independent with instrumental ADLs Does note "my memory is going bad"  Having ongoing sleep problems Uses the clonazepam and mirapex  Still playing tennis No chest pain No palpitations No dizziness or fainting spells Mild edema---above ankle Known GFR in 40's No SOB  Voids okay Nocturia x 2-3 and stable Flow seems adequate  No heartburn or dysphagia on omeprazole  Mood is generally okay No regular depression on bupropion No problems with anxiety "A little bit crazy" with the pandemic  Current Outpatient Medications on File Prior to Visit  Medication Sig Dispense Refill  . amLODipine (NORVASC) 5 MG tablet TAKE 1 TABLET BY MOUTH DAILY 90 tablet 3  . aspirin EC 81 MG EC tablet Take 1 tablet (81 mg total) by mouth daily. 30 tablet 1  . buPROPion (WELLBUTRIN XL) 150 MG 24 hr tablet TAKE ONE TABLET BY MOUTH EVERY DAY 90 tablet 3  . clonazePAM (KLONOPIN) 0.5 MG tablet TAKE  1-2 TABLETS BY MOUTH AT BEDTIME 180 tablet 0  . finasteride (PROSCAR) 5 MG tablet TAKE 1 TABLET BY MOUTH DAILY 90 tablet 3  . hydrochlorothiazide (HYDRODIURIL) 25 MG tablet TAKE 1 TABLET BY MOUTH DAILY 90 tablet 3  . losartan (COZAAR) 100 MG tablet TAKE 1 TABLET BY MOUTH DAILY 90 tablet 3  . losartan-hydrochlorothiazide (HYZAAR) 100-25 MG tablet TAKE ONE TABLET BY MOUTH EVERY DAY 90 tablet 3  . Multiple Vitamin (MULTIVITAMIN) capsule Take 1 capsule by mouth daily.      Marland Kitchen omeprazole (PRILOSEC) 40 MG capsule TAKE 1 CAPSULE BY MOUTH EVERY DAY 90 capsule 3  . polyethylene glycol (MIRALAX  / GLYCOLAX) packet Take 17 g by mouth daily.    . pramipexole (MIRAPEX) 1 MG tablet Take 1 mg by mouth at dinner and 1 mg by mouth at bedtime. 180 tablet 3  . pyridoxine (B-6) 100 MG tablet Take 100 mg by mouth daily.    . tamsulosin (FLOMAX) 0.4 MG CAPS capsule TAKE 1 CAPSULE BY MOUTH EVERY DAY 90 capsule 3  . [DISCONTINUED] citalopram (CELEXA) 10 MG tablet Take 1 tablet (10 mg total) by mouth daily. 30 tablet 5   No current facility-administered medications on file prior to visit.     Allergies  Allergen Reactions  . Atorvastatin     REACTION: raises blood pressure  . Pravastatin Sodium     REACTION: raises blood pressure  . Statins     REACTION: raises bp    Past Medical History:  Diagnosis Date  . Actinic keratosis   . BPH (benign prostatic hypertrophy)   . Diseases of lips   . Diverticulosis of colon 06/2003  . ED (erectile dysfunction)   . GERD (gastroesophageal reflux disease)   . Heart murmur    as child  . HLD (hyperlipidemia)   . HOH (hard of hearing)   . HTN (hypertension)   . Osteoarthritis of knee    severe, bilat  . Renal insufficiency   . Restless legs syndrome (RLS)   . Sleep disturbance, unspecified   . Spinal stenosis, lumbar region, without neurogenic claudication  Past Surgical History:  Procedure Laterality Date  . CATARACT EXTRACTION, BILATERAL  2011   Dr. Thomasene Ripple  . COCHLEAR IMPLANT Right 02/15/2017   Procedure: RIGHT COCHLEAR IMPLANT;  Surgeon: Vicie Mutters, MD;  Location: [facility];  Service: ENT;  Laterality: Right;  . FINGER SURGERY     Right 4th finger  . [facility]  10/2009   Dr. Bary Castilla  . LUMBAR SPINE SURGERY  06/2005   stenosis repair  . TONSILLECTOMY  childhood  . TOTAL KNEE ARTHROPLASTY  06/2008   bilat (Dr. Marry Guan)    Family History  Problem Relation Age of Onset  . Other Father        "clogged arteries"  . Hypertension Mother     Social History   Socioeconomic History  . Marital status: Widowed    Spouse  name: Not on file  . Number of children: 2  . Years of education: Not on file  . Highest education level: Not on file  Occupational History  . Occupation: Retired  Scientific laboratory technician  . Financial resource strain: Not on file  . Food insecurity    Worry: Not on file    Inability: Not on file  . Transportation needs    Medical: Not on file    Non-medical: Not on file  Tobacco Use  . Smoking status: Former Smoker    Packs/day: 0.75    Years: 30.00    Pack years: 22.50    Quit date: 02/28/1970    Years since quitting: 48.7  . Smokeless tobacco: Never Used  Substance and Sexual Activity  . Alcohol use: Yes    Alcohol/week: 0.0 standard drinks    Comment: 1-2 glasses wine/daily  . Drug use: No  . Sexual activity: Not on file  Lifestyle  . Physical activity    Days per week: Not on file    Minutes per session: Not on file  . Stress: Not on file  Relationships  . Social Herbalist on phone: Not on file    Gets together: Not on file    Attends religious service: Not on file    Active member of club or organization: Not on file    Attends meetings of clubs or organizations: Not on file    Relationship status: Not on file  . Intimate partner violence    Fear of current or ex partner: Not on file    Emotionally abused: Not on file    Physically abused: Not on file    Forced sexual activity: Not on file  Other Topics Concern  . Not on file  Social History Narrative   Widowed 2010   2 adopted children   Has living will   Son is health care power of attorney   Would accept resuscitation but no prolonged ventilation   No tube feeds if cognitively unaware   Review of Systems Appetite is "too good" Weight is stable  Wears seat belt Has had 5 recent cavities--getting the work done Bowels are fine--no blood No sig back or joint pain now Skin cancer removed this year---no suspicious lesions now    Objective:   Physical Exam  Constitutional: He is oriented to person,  place, and time. He appears well-developed. No distress.  HENT:  Mouth/Throat: No oropharyngeal exudate.  No oral lesions  Neck: No thyromegaly present.  Cardiovascular: Normal rate, regular rhythm and normal heart sounds. Exam reveals no gallop.  No murmur heard. Feet warm but no palpable pulses  Respiratory: Effort normal and breath sounds normal. No respiratory distress. He has no wheezes. He has no rales.  GI: Soft. There is no abdominal tenderness.  Musculoskeletal:     Comments: 1+ lower calf edema bilaterally  Lymphadenopathy:    He has no cervical adenopathy.  Neurological: He is alert and oriented to person, place, and time.  President--- "Dwaine Deter, [place]" [phone] [id number] Recall 3/3  Skin:  Skin tears right forearm and calf--- no infection  Psychiatric: He has a normal mood and affect. His behavior is normal.           Assessment & Plan:

## 2018-11-27 NOTE — Progress Notes (Signed)
Hearing Screening   125Hz  250Hz  500Hz  1000Hz  2000Hz  3000Hz  4000Hz  6000Hz  8000Hz   Right ear:           Left ear:           Comments: Has hearing aids. Wearing them today.  Vision Screening Comments: September 2020

## 2018-11-27 NOTE — Telephone Encounter (Signed)
Patient called stating that he was in earlier today and on his summary the person on his release of information is wrong and he wants it changed. Advised patient that this has to be done in writing. Advised patient that the form he needs to complete and change is called a DPR. Patient stated that he will come by the office in the next few days to complete it and make the changes.

## 2018-11-27 NOTE — Assessment & Plan Note (Signed)
Asked him to try to cut back on his meds

## 2018-11-27 NOTE — Assessment & Plan Note (Signed)
Dysthymia mostly Stable on the bupropion

## 2018-11-27 NOTE — Patient Instructions (Signed)
Please try cutting back on the clonazepam &/or the mirapex.

## 2018-11-27 NOTE — Assessment & Plan Note (Signed)
I have personally reviewed the Medicare Annual Wellness questionnaire and have noted 1. The patient's medical and social history 2. Their use of alcohol, tobacco or illicit drugs 3. Their current medications and supplements 4. The patient's functional ability including ADL's, fall risks, home safety risks and hearing or visual             impairment. 5. Diet and physical activities 6. Evidence for depression or mood disorders  The patients weight, height, BMI and visual acuity have been recorded in the chart I have made referrals, counseling and provided education to the patient based review of the above and I have provided the pt with a written personalized care plan for preventive services.  I have provided you with a copy of your personalized plan for preventive services. Please take the time to review along with your updated medication list.  No cancer screening due to age Had flu vaccine Continues to exercise Discussed safety

## 2018-11-27 NOTE — Assessment & Plan Note (Signed)
See social history 

## 2018-11-27 NOTE — Assessment & Plan Note (Signed)
Will recheck On ACEI

## 2018-11-27 NOTE — Assessment & Plan Note (Signed)
Mild edema--but stable

## 2018-11-29 ENCOUNTER — Encounter: Payer: Self-pay | Admitting: Nurse Practitioner

## 2018-11-29 ENCOUNTER — Other Ambulatory Visit: Payer: Self-pay

## 2018-11-29 ENCOUNTER — Ambulatory Visit: Payer: Medicare Other | Admitting: Nurse Practitioner

## 2018-11-29 VITALS — BP 134/72 | HR 76 | Ht 66.0 in | Wt 179.0 lb

## 2018-11-29 DIAGNOSIS — S51811D Laceration without foreign body of right forearm, subsequent encounter: Secondary | ICD-10-CM

## 2018-11-29 DIAGNOSIS — I872 Venous insufficiency (chronic) (peripheral): Secondary | ICD-10-CM

## 2018-11-29 DIAGNOSIS — S81811D Laceration without foreign body, right lower leg, subsequent encounter: Secondary | ICD-10-CM | POA: Diagnosis not present

## 2018-11-29 NOTE — Progress Notes (Signed)
Careteam: Patient Care Team: Stephen Carbon, MD as PCP - General  Advanced Directive information    Allergies  Allergen Reactions  . Atorvastatin     REACTION: raises blood pressure  . Pravastatin Sodium     REACTION: raises blood pressure  . Statins     REACTION: raises bp    Chief Complaint  Patient presents with  . Acute Visit    Followup on wound to right arm and fall from a fall     HPI: Patient is a 85 y.o. male seen today at [facility] to recheck wound after fall. Pt is very HOH. He was seen by an [place] nurse and is due for reassessment and dressing change. States "memory is terrible" States it was a rainy day and it he stepped down on a metal piece and slide down.  Reports he will be more careful next time when it is raining.  No fever, chills, pain to wound site. He has been coming to clinic to have dressing change.  Wounds have been healing well. Some worsening lower extremity edema noted. Got compression wraps yesterday.   Review of Systems:  Review of Systems  Constitutional: Negative for chills, fever and malaise/fatigue.  Respiratory: Negative for cough and sputum production.   Cardiovascular: Positive for leg swelling. Negative for chest pain and palpitations.  Gastrointestinal: Negative for abdominal pain, constipation, diarrhea and heartburn.  Musculoskeletal: Positive for falls. Negative for back pain, joint pain and myalgias.  Skin:       Wound to right forearm and calf, gets dressing changes every 3 days by staff  Neurological: Negative for dizziness and headaches.  Psychiatric/Behavioral: Negative for depression and memory loss. The patient does not have insomnia.     Past Medical History:  Diagnosis Date  . Actinic keratosis   . BPH (benign prostatic hypertrophy)   . Diseases of lips   . Diverticulosis of colon 06/2003  . ED (erectile dysfunction)   . GERD (gastroesophageal reflux disease)   . Heart murmur    as child  . HLD  (hyperlipidemia)   . HOH (hard of hearing)    Cochlear implant right, hearing aid left  . HTN (hypertension)   . Osteoarthritis of knee    severe, bilat  . Renal insufficiency   . Restless legs syndrome (RLS)   . Sleep disturbance, unspecified   . Spinal stenosis, lumbar region, without neurogenic claudication    Past Surgical History:  Procedure Laterality Date  . CATARACT EXTRACTION, BILATERAL  2011   Dr. Thomasene Ripple  . COCHLEAR IMPLANT Right 02/15/2017   Procedure: RIGHT COCHLEAR IMPLANT;  Surgeon: Vicie Mutters, MD;  Location: [facility];  Service: ENT;  Laterality: Right;  . FINGER SURGERY     Right 4th finger  . [facility]  10/2009   Dr. Bary Castilla  . LUMBAR SPINE SURGERY  06/2005   stenosis repair  . TONSILLECTOMY  childhood  . TOTAL KNEE ARTHROPLASTY  06/2008   bilat (Dr. Marry Guan)   Social History:   reports that he quit smoking about 48 years ago. He has a 22.50 pack-year smoking history. He has never used smokeless tobacco. He reports current alcohol use. He reports that he does not use drugs.  Family History  Problem Relation Age of Onset  . Other Father        "clogged arteries"  . Hypertension Mother     Medications: Patient's Medications  New Prescriptions   No medications on file  Previous  Medications   AMLODIPINE (NORVASC) 5 MG TABLET    TAKE 1 TABLET BY MOUTH DAILY   ASPIRIN EC 81 MG EC TABLET    Take 1 tablet (81 mg total) by mouth daily.   BUPROPION (WELLBUTRIN XL) 150 MG 24 HR TABLET    TAKE ONE TABLET BY MOUTH EVERY DAY   CLONAZEPAM (KLONOPIN) 0.5 MG TABLET    TAKE  1-2 TABLETS BY MOUTH AT BEDTIME   FINASTERIDE (PROSCAR) 5 MG TABLET    TAKE 1 TABLET BY MOUTH DAILY   HYDROCHLOROTHIAZIDE (HYDRODIURIL) 25 MG TABLET    TAKE 1 TABLET BY MOUTH DAILY   LOSARTAN (COZAAR) 100 MG TABLET    TAKE 1 TABLET BY MOUTH DAILY   MULTIPLE VITAMIN (MULTIVITAMIN) CAPSULE    Take 1 capsule by mouth daily.     OMEPRAZOLE (PRILOSEC) 40 MG CAPSULE    TAKE 1 CAPSULE BY MOUTH  EVERY DAY   POLYETHYLENE GLYCOL (MIRALAX / GLYCOLAX) PACKET    Take 17 g by mouth daily.   PRAMIPEXOLE (MIRAPEX) 1 MG TABLET    Take 1 mg by mouth at dinner and 1 mg by mouth at bedtime.   PYRIDOXINE (B-6) 100 MG TABLET    Take 100 mg by mouth daily.   TAMSULOSIN (FLOMAX) 0.4 MG CAPS CAPSULE    TAKE 1 CAPSULE BY MOUTH EVERY DAY  Modified Medications   No medications on file  Discontinued Medications   LOSARTAN-HYDROCHLOROTHIAZIDE (HYZAAR) 100-25 MG TABLET    TAKE ONE TABLET BY MOUTH EVERY DAY    Physical Exam:  Vitals:   11/29/18 1003  BP: 134/72  Pulse: 76  SpO2: 99%  Weight: 179 lb (81.2 kg)  Height: 5\' 6"  (1.676 m)   Body mass index is 28.89 kg/m. Wt Readings from Last 3 Encounters:  11/29/18 179 lb (81.2 kg)  11/27/18 179 lb (81.2 kg)  03/30/18 180 lb 4 oz (81.8 kg)    Physical Exam Constitutional:      Appearance: He is well-developed.  HENT:     Head: Normocephalic and atraumatic.     Mouth/Throat:     Pharynx: No oropharyngeal exudate.  Musculoskeletal:        General: No tenderness.     Right lower leg: Edema (2+) present.     Left lower leg: Edema (2+) present.  Skin:    General: Skin is warm and dry.          Comments: Skin tear noted to right forear, healing well. No erythema or warmth. Scant drainage noted to old dressing. No signs of infection.  Cleanse with NS and nonstick dressing applied.   Skin tear to right calf. Healing well, with scant drainage noted to dressing. slightly pink surrounding skin tear with small opened area. No heat noted.  No signs of infection at this time. Area cleanse and dressing change. No pain or tenderness noted  Neurological:     Mental Status: He is alert and oriented to person, place, and time.  Psychiatric:        Mood and Affect: Mood normal.     Labs reviewed: Basic Metabolic Panel: Recent Labs    11/27/18 0856  NA 140  K 3.7  CL 102  CO2 29  GLUCOSE 102*  BUN 27*  CREATININE 1.44  CALCIUM 9.8   Liver  Function Tests: Recent Labs    11/27/18 0856  AST 24  ALT 17  ALKPHOS 88  BILITOT 0.6  PROT 6.3  ALBUMIN 4.1   No  results for input(s): LIPASE, AMYLASE in the last 8760 hours. No results for input(s): AMMONIA in the last 8760 hours. CBC: Recent Labs    11/27/18 0856  WBC 5.8  HGB 13.0  HCT 39.0  MCV 91.4  PLT 187.0   Lipid Panel: No results for input(s): CHOL, HDL, LDLCALC, TRIG, CHOLHDL, LDLDIRECT in the last 8760 hours. TSH: No results for input(s): TSH in the last 8760 hours. A1C: No results found for: HGBA1C   Assessment/Plan 1. Chronic venous insufficiency Worsening swelling to bilateral LE. Pt noted that within the last few days the swelling was getting worse. Got compression hose yesterday and now wearing during the day. Stressed importance of wearing this during the day, keeping legs elevated above the level of the heart and low sodium diet. Also making sure he is maintaining adequate nutrition and protein intake.  2. Skin tear of right forearm without complication, subsequent encounter Healing well. Continues routine dressing changes. [place] nurse notified and he plans to schedule a time with her.  3. Skin tear of right lower leg without complication, subsequent encounter Healing well however not with worsening LE edema puts wound at risk for complications. [place] Nurse will monitor closely and notify as needed. To continue dressing changes.   Stephen Garrett. [place], [facility] [phone]

## 2018-12-01 IMAGING — DX DG CHEST 2V
2 series · 2 of 2 positions shown · non-contrast
Comparison: Single-view of the chest 09/27/2017. PA and lateral
chest 12/09/2016.

CLINICAL DATA: History of pneumonia.  Evaluate for resolution.

EXAM:
CHEST - 2 VIEW

[chest pa]
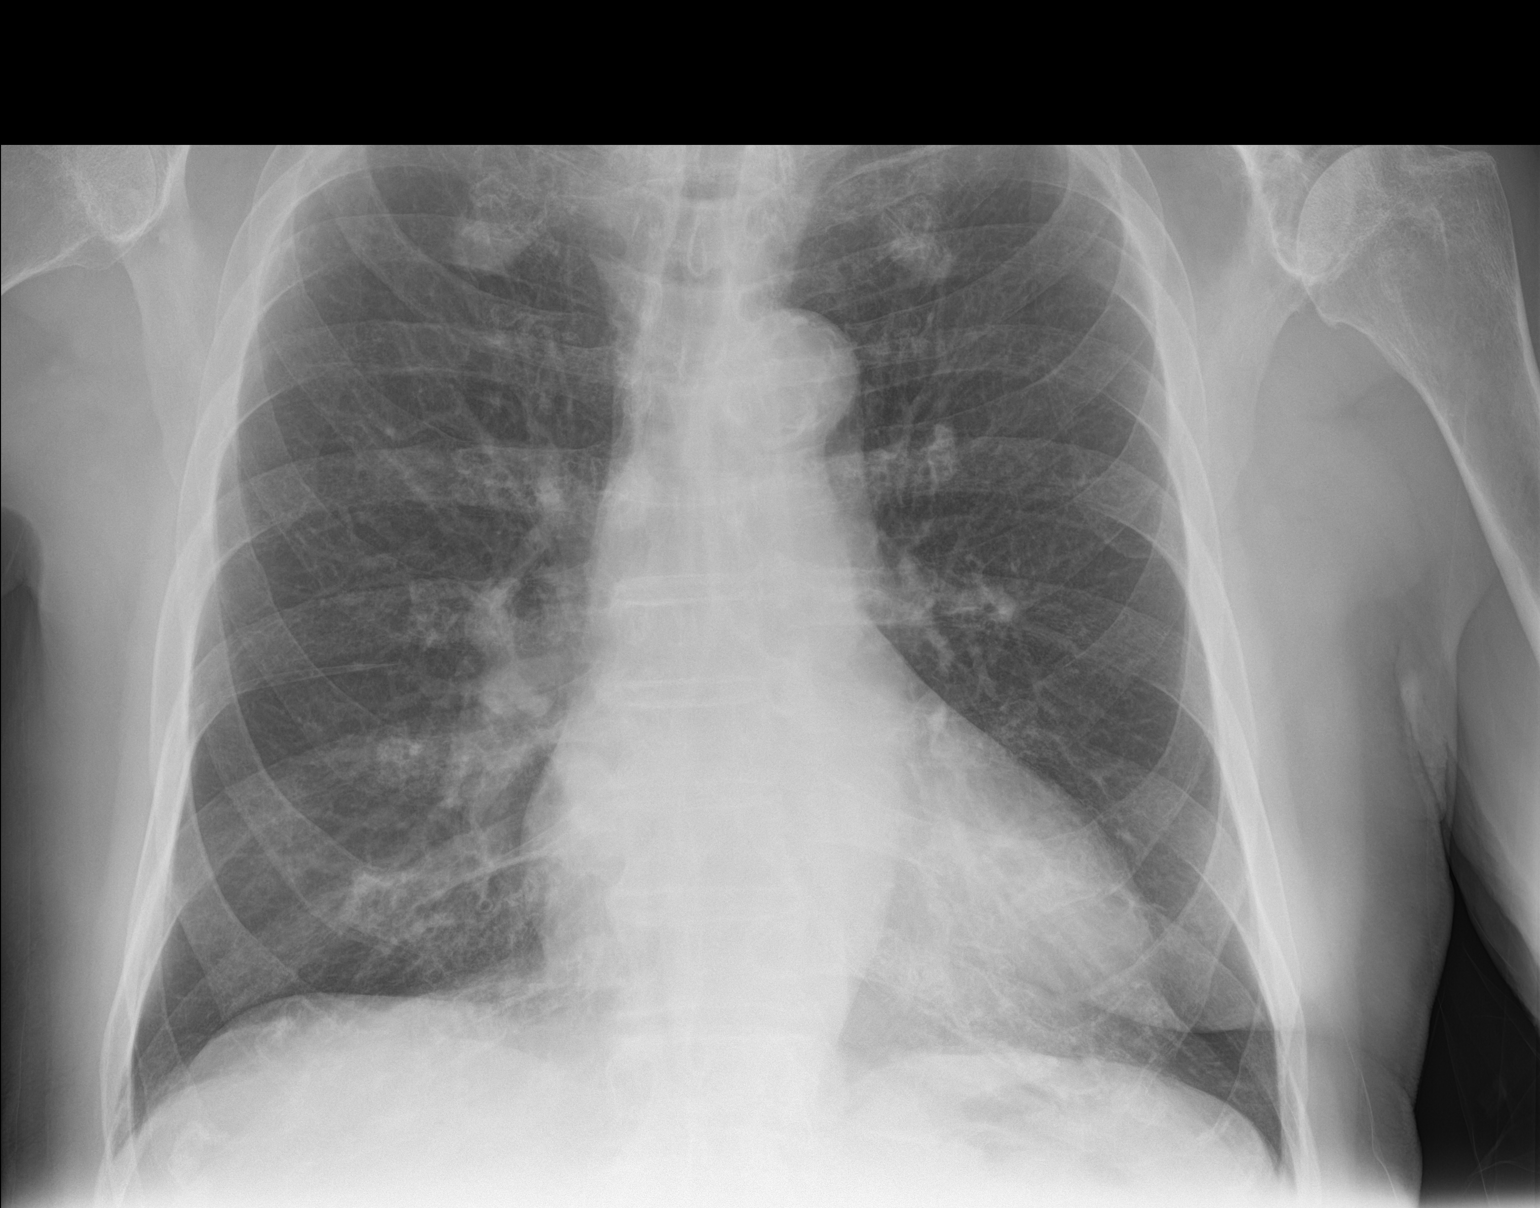

[chest lat]
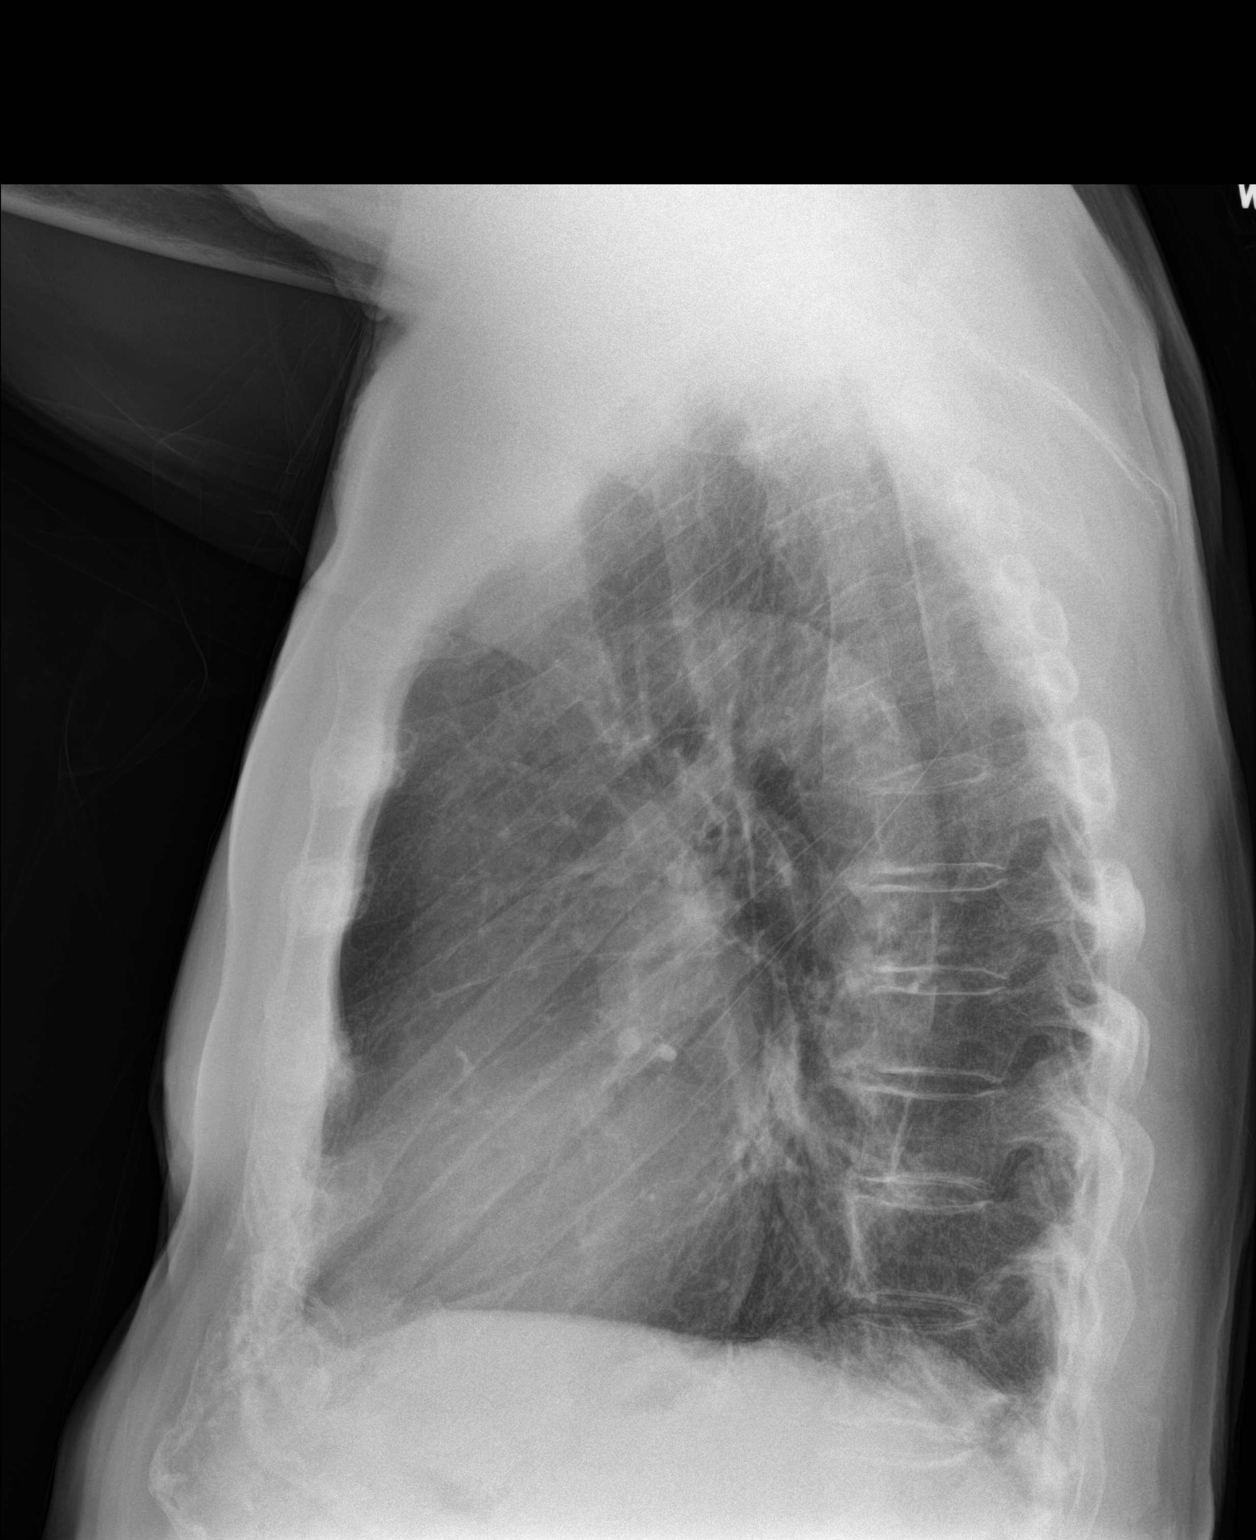

[2 of 2 positions shown; findings below may reference images not displayed]

FINDINGS: Pneumonia on the left seen on the most recent examination has
resolved. Lungs are clear. Heart size is normal. Aortic
atherosclerosis noted. No pneumothorax or pleural effusion
IMPRESSION: Resolved pneumonia.  No acute disease.

Atherosclerosis.

## 2018-12-04 ENCOUNTER — Other Ambulatory Visit: Payer: Self-pay

## 2018-12-04 ENCOUNTER — Ambulatory Visit: Payer: Medicare Other | Admitting: Nurse Practitioner

## 2018-12-20 DIAGNOSIS — X32XXXA Exposure to sunlight, initial encounter: Secondary | ICD-10-CM | POA: Diagnosis not present

## 2018-12-20 DIAGNOSIS — L570 Actinic keratosis: Secondary | ICD-10-CM | POA: Diagnosis not present

## 2018-12-20 DIAGNOSIS — L821 Other seborrheic keratosis: Secondary | ICD-10-CM | POA: Diagnosis not present

## 2018-12-20 DIAGNOSIS — Z85820 Personal history of malignant melanoma of skin: Secondary | ICD-10-CM | POA: Diagnosis not present

## 2018-12-20 DIAGNOSIS — Z85828 Personal history of other malignant neoplasm of skin: Secondary | ICD-10-CM | POA: Diagnosis not present

## 2018-12-20 DIAGNOSIS — Z08 Encounter for follow-up examination after completed treatment for malignant neoplasm: Secondary | ICD-10-CM | POA: Diagnosis not present

## 2018-12-25 DIAGNOSIS — H34832 Tributary (branch) retinal vein occlusion, left eye, with macular edema: Secondary | ICD-10-CM | POA: Diagnosis not present

## 2018-12-31 ENCOUNTER — Other Ambulatory Visit: Payer: Self-pay | Admitting: Internal Medicine

## 2019-01-09 DIAGNOSIS — H02831 Dermatochalasis of right upper eyelid: Secondary | ICD-10-CM | POA: Diagnosis not present

## 2019-01-09 DIAGNOSIS — H02834 Dermatochalasis of left upper eyelid: Secondary | ICD-10-CM | POA: Diagnosis not present

## 2019-01-14 ENCOUNTER — Other Ambulatory Visit: Payer: Self-pay | Admitting: Internal Medicine

## 2019-01-14 NOTE — Telephone Encounter (Signed)
Last filled 09-17-18 #180 Last OV 11-27-18 Next OV 12-04-19 [facility]

## 2019-01-31 DIAGNOSIS — H02403 Unspecified ptosis of bilateral eyelids: Secondary | ICD-10-CM | POA: Diagnosis not present

## 2019-02-05 DIAGNOSIS — H34832 Tributary (branch) retinal vein occlusion, left eye, with macular edema: Secondary | ICD-10-CM | POA: Diagnosis not present

## 2019-03-18 ENCOUNTER — Other Ambulatory Visit: Payer: Self-pay | Admitting: Internal Medicine

## 2019-04-08 ENCOUNTER — Ambulatory Visit (INDEPENDENT_AMBULATORY_CARE_PROVIDER_SITE_OTHER): Payer: Medicare Other | Admitting: Internal Medicine

## 2019-04-08 ENCOUNTER — Other Ambulatory Visit: Payer: Self-pay

## 2019-04-08 ENCOUNTER — Encounter: Payer: Self-pay | Admitting: Internal Medicine

## 2019-04-08 DIAGNOSIS — R200 Anesthesia of skin: Secondary | ICD-10-CM

## 2019-04-08 NOTE — Progress Notes (Signed)
Subjective:    Patient ID: Stephen Garrett, male    DOB: 03/11/1923, 86 y.o.   MRN: [id number]  HPI Here due to hand swelling and numbness This visit occurred during the SARS-CoV-2 public health emergency.  Safety protocols were in place, including screening questions prior to the visit, additional usage of staff PPE, and extensive cleaning of exam room while observing appropriate contact time as indicated for disinfecting solutions.   Got a new mattress cover (pad) Slept "deeper than ever" for a couple of hours Felt very strange for a while  Has swelling and numbness in both hands Now wearing braces (cock up splint) and symptoms are better Left thumb, 2nd and 3rd fingers Right hand "all are slightly numb" No hand weakness  Still playing tennis No symptoms in hands after playing (just very tired)  Current Outpatient Medications on File Prior to Visit  Medication Sig Dispense Refill  . amLODipine (NORVASC) 5 MG tablet TAKE 1 TABLET BY MOUTH DAILY 90 tablet 3  . buPROPion (WELLBUTRIN XL) 150 MG 24 hr tablet TAKE 1 TABLET BY MOUTH DAILY 90 tablet 3  . clonazePAM (KLONOPIN) 0.5 MG tablet TAKE ONE TO TWO TABLETS AT BEDTIME 180 tablet 0  . finasteride (PROSCAR) 5 MG tablet TAKE 1 TABLET BY MOUTH DAILY 90 tablet 3  . hydrochlorothiazide (HYDRODIURIL) 25 MG tablet TAKE 1 TABLET BY MOUTH DAILY 90 tablet 3  . losartan (COZAAR) 100 MG tablet TAKE 1 TABLET BY MOUTH DAILY 90 tablet 3  . Multiple Vitamin (MULTIVITAMIN) capsule Take 1 capsule by mouth daily.      Marland Kitchen omeprazole (PRILOSEC) 40 MG capsule TAKE 1 CAPSULE BY MOUTH EVERY DAY 90 capsule 3  . polyethylene glycol (MIRALAX / GLYCOLAX) packet Take 17 g by mouth daily.    . pramipexole (MIRAPEX) 1 MG tablet TAKE ONE TABLET AT DINNER AND ONE TABLETAT BEDTIME 180 tablet 3  . pyridoxine (B-6) 100 MG tablet Take 100 mg by mouth daily.    . tamsulosin (FLOMAX) 0.4 MG CAPS capsule TAKE 1 CAPSULE BY MOUTH EVERY DAY 90 capsule 3  . aspirin EC 81 MG  EC tablet Take 1 tablet (81 mg total) by mouth daily. (Patient not taking: Reported on 04/08/2019) 30 tablet 1  . [DISCONTINUED] citalopram (CELEXA) 10 MG tablet Take 1 tablet (10 mg total) by mouth daily. 30 tablet 5   No current facility-administered medications on file prior to visit.    Allergies  Allergen Reactions  . Atorvastatin     REACTION: raises blood pressure  . Pravastatin Sodium     REACTION: raises blood pressure  . Statins     REACTION: raises bp    Past Medical History:  Diagnosis Date  . Actinic keratosis   . BPH (benign prostatic hypertrophy)   . Diseases of lips   . Diverticulosis of colon 06/2003  . ED (erectile dysfunction)   . GERD (gastroesophageal reflux disease)   . Heart murmur    as child  . HLD (hyperlipidemia)   . HOH (hard of hearing)    Cochlear implant right, hearing aid left  . HTN (hypertension)   . Osteoarthritis of knee    severe, bilat  . Renal insufficiency   . Restless legs syndrome (RLS)   . Sleep disturbance, unspecified   . Spinal stenosis, lumbar region, without neurogenic claudication     Past Surgical History:  Procedure Laterality Date  . CATARACT EXTRACTION, BILATERAL  2011   Dr. Thomasene Ripple  . COCHLEAR IMPLANT Right  02/15/2017   Procedure: RIGHT COCHLEAR IMPLANT;  Surgeon: Vicie Mutters, MD;  Location: [facility];  Service: ENT;  Laterality: Right;  . FINGER SURGERY     Right 4th finger  . [facility]  10/2009   Dr. Bary Castilla  . LUMBAR SPINE SURGERY  06/2005   stenosis repair  . TONSILLECTOMY  childhood  . TOTAL KNEE ARTHROPLASTY  06/2008   bilat (Dr. Marry Guan)    Family History  Problem Relation Age of Onset  . Other Father        "clogged arteries"  . Hypertension Mother     Social History   Socioeconomic History  . Marital status: Widowed    Spouse name: Not on file  . Number of children: 2  . Years of education: Not on file  . Highest education level: Not on file  Occupational History  . Occupation:  Retired  Tobacco Use  . Smoking status: Former Smoker    Packs/day: 0.75    Years: 30.00    Pack years: 22.50    Quit date: 02/28/1970    Years since quitting: 49.1  . Smokeless tobacco: Never Used  Substance and Sexual Activity  . Alcohol use: Yes    Alcohol/week: 0.0 standard drinks    Comment: 1-2 glasses wine/daily  . Drug use: No  . Sexual activity: Not on file  Other Topics Concern  . Not on file  Social History Narrative   Widowed 2010   2 adopted children   Has living will   Daughter is now health care power of attorney   Would accept resuscitation but no prolonged ventilation   No tube feeds if cognitively unaware   Social Determinants of Health   Financial Resource Strain:   . Difficulty of Paying Living Expenses: Not on file  Food Insecurity:   . Worried About Charity fundraiser in the Last Year: Not on file  . Ran Out of Food in the Last Year: Not on file  Transportation Needs:   . Lack of Transportation (Medical): Not on file  . Lack of Transportation (Non-Medical): Not on file  Physical Activity:   . Days of Exercise per Week: Not on file  . Minutes of Exercise per Session: Not on file  Stress:   . Feeling of Stress : Not on file  Social Connections:   . Frequency of Communication with Friends and Family: Not on file  . Frequency of Social Gatherings with Friends and Family: Not on file  . Attends Religious Services: Not on file  . Active Member of Clubs or Organizations: Not on file  . Attends Archivist Meetings: Not on file  . Marital Status: Not on file  Intimate Partner Violence:   . Fear of Current or Ex-Partner: Not on file  . Emotionally Abused: Not on file  . Physically Abused: Not on file  . Sexually Abused: Not on file   Review of Systems Slight triggering of left pinky Eating okay No weight loss     Objective:   Physical Exam  Constitutional: No distress.  Musculoskeletal:     Comments: Triggering mildly in right finger  and can't flex well Normal ROM otherwise  Neurological:  Normal hand grips other than the right 4th finger  Psychiatric: He has a normal mood and affect. His behavior is normal.           Assessment & Plan:

## 2019-04-08 NOTE — Assessment & Plan Note (Signed)
Mostly at night--not with tennis Distribution is not consistent with CTS---but the braces are helping Will continue the braces at night----if he worsens, he will let me know and I will set him up with hand surgeon in [place]

## 2019-04-12 DIAGNOSIS — Z789 Other specified health status: Secondary | ICD-10-CM

## 2019-04-15 ENCOUNTER — Other Ambulatory Visit: Payer: Self-pay

## 2019-04-15 ENCOUNTER — Encounter: Payer: Self-pay | Admitting: Ophthalmology

## 2019-04-17 ENCOUNTER — Other Ambulatory Visit
Admission: RE | Admit: 2019-04-17 | Discharge: 2019-04-17 | Disposition: A | Discharge status: Home / Self Care | Payer: Medicare Other | Source: Ambulatory Visit | Attending: Ophthalmology | Admitting: Ophthalmology

## 2019-04-17 DIAGNOSIS — Z01812 Encounter for preprocedural laboratory examination: Secondary | ICD-10-CM | POA: Insufficient documentation

## 2019-04-17 DIAGNOSIS — Z20822 Contact with and (suspected) exposure to covid-19: Secondary | ICD-10-CM | POA: Insufficient documentation

## 2019-04-17 LAB — SARS CORONAVIRUS 2 (TAT 6-24 HRS): SARS Coronavirus 2: NEGATIVE

## 2019-04-17 NOTE — Discharge Instructions (Signed)
INSTRUCTIONS FOLLOWING OCULOPLASTIC SURGERY AMY M. FOWLER, MD  AFTER YOUR EYE SURGERY, THER ARE MANY THINGS WHICH YOU, THE PATIENT, CAN DO TO ASSURE THE BEST POSSIBLE RESULT FROM YOUR OPERATION.  THIS SHEET SHOULD BE REFERRED TO WHENEVER QUESTIONS ARISE.  IF THERE ARE ANY QUESTIONS NOT ANSWERED HERE, DO NOT HESITATE TO CALL OUR OFFICE AT [phone] OR [phone].  THERE IS ALWAYS SOMEONE AVAILABLE TO CALL IF QUESTIONS OR PROBLEMS ARISE.  VISION: Your vision may be blurred and out of focus after surgery until you are able to stop using your ointment, swelling resolves and your eye(s) heal. This may take 1 to 2 weeks at the least.  If your vision becomes gradually more dim or dark, this is not normal and you need to call our office immediately.  EYE CARE: For the first 48 hours after surgery, use ice packs frequently - "20 minutes on, 20 minutes off" - to help reduce swelling and bruising.  Small bags of frozen peas or corn make good ice packs along with cloths soaked in ice water.  If you are wearing a patch or other type of dressing following surgery, keep this on for the amount of time specified by your doctor.  For the first week following surgery, you will need to treat your stitches with great care.  It is OK to shower, but take care to not allow soapy water to run into your eye(s) to help reduce chances of infection.  You may gently clean the eyelashes and around the eye(s) with cotton balls and sterile water, BUT DO NOT RUB THE STITCHES VIGOROUSLY.  Keeping your stitches moist with ointment will help promote healing with minimal scar formation.  ACTIVITY: When you leave the surgery center, you should go home, rest and be inactive.  The eye(s) may feel scratchy and keeping the eyes closed will allow for faster healing.  The first week following surgery, avoid straining (anything making the face turn red) or lifting over 20 pounds.  Additionally, avoid bending which causes your head to go below  your waist.  Using your eyes will NOT harm them, so feel free to read, watch television, use the computer, etc as desired.  Driving depends on each individual, so check with your doctor if you have questions about driving. Do not wear contact lenses for about 2 weeks.  Do not wear eye makeup for 2 weeks.  Avoid swimming, hot tubs, gardening, and dusting for 1 to 2 weeks to reduce the risk of an infection.  MEDICATIONS:  You will be given a prescription for an ointment to use 4 times a day on your stitches.  You can use the ointment in your eyes if they feel scratchy or irritated.  If you eyelid(s) don't close completely when you sleep, put some ointment in your eyes before bedtime.  EMERGENCY: If you experience SEVERE EYE PAIN OR HEADACHE UNRELIEVED BY TYLENOL OR PERCOCET, NAUSEA OR VOMITING, WORSENING REDNESS, OR WORSENING VISION (ESPECIALLY VISION THAT WAS INITIALLY BETTER) CALL [phone] OR [phone] DURING BUSINESS HOURS OR AFTER HOURS.  General Anesthesia, Adult, Care After This sheet gives you information about how to care for yourself after your procedure. Your health care provider may also give you more specific instructions. If you have problems or questions, contact your health care provider. What can I expect after the procedure? After the procedure, the following side effects are common:  Pain or discomfort at the IV site.  Nausea.  Vomiting.  Sore throat.  Trouble concentrating.    Feeling cold or chills.  Weak or tired.  Sleepiness and fatigue.  Soreness and body aches. These side effects can affect parts of the body that were not involved in surgery. Follow these instructions at home:  For at least 24 hours after the procedure:  Have a responsible adult stay with you. It is important to have someone help care for you until you are awake and alert.  Rest as needed.  Do not: ? Participate in activities in which you could fall or become injured. ? Drive. ? Use  heavy machinery. ? Drink alcohol. ? Take sleeping pills or medicines that cause drowsiness. ? Make important decisions or sign legal documents. ? Take care of children on your own. Eating and drinking  Follow any instructions from your health care provider about eating or drinking restrictions.  When you feel hungry, start by eating small amounts of foods that are soft and easy to digest (bland), such as toast. Gradually return to your regular diet.  Drink enough fluid to keep your urine pale yellow.  If you vomit, rehydrate by drinking water, juice, or clear broth. General instructions  If you have sleep apnea, surgery and certain medicines can increase your risk for breathing problems. Follow instructions from your health care provider about wearing your sleep device: ? Anytime you are sleeping, including during daytime naps. ? While taking prescription pain medicines, sleeping medicines, or medicines that make you drowsy.  Return to your normal activities as told by your health care provider. Ask your health care provider what activities are safe for you.  Take over-the-counter and prescription medicines only as told by your health care provider.  If you smoke, do not smoke without supervision.  Keep all follow-up visits as told by your health care provider. This is important. Contact a health care provider if:  You have nausea or vomiting that does not get better with medicine.  You cannot eat or drink without vomiting.  You have pain that does not get better with medicine.  You are unable to pass urine.  You develop a skin rash.  You have a fever.  You have redness around your IV site that gets worse. Get help right away if:  You have difficulty breathing.  You have chest pain.  You have blood in your urine or stool, or you vomit blood. Summary  After the procedure, it is common to have a sore throat or nausea. It is also common to feel tired.  Have a  responsible adult stay with you for the first 24 hours after general anesthesia. It is important to have someone help care for you until you are awake and alert.  When you feel hungry, start by eating small amounts of foods that are soft and easy to digest (bland), such as toast. Gradually return to your regular diet.  Drink enough fluid to keep your urine pale yellow.  Return to your normal activities as told by your health care provider. Ask your health care provider what activities are safe for you. This information is not intended to replace advice given to you by your health care provider. Make sure you discuss any questions you have with your health care provider. Document Revised: 02/17/2017 Document Reviewed: 09/30/2016 Elsevier Patient Education  2020 [employer]  

## 2019-04-18 NOTE — Anesthesia Preprocedure Evaluation (Addendum)
Anesthesia Evaluation  Patient identified by MRN, date of birth, ID band Patient awake    Reviewed: Allergy & Precautions, NPO status , Patient's Chart, lab work & pertinent test results  History of Anesthesia Complications Negative for: history of anesthetic complications  Airway Mallampati: II  TM Distance: >3 FB Neck ROM: Limited    Dental   Pulmonary former smoker,    breath sounds clear to auscultation       Cardiovascular hypertension, (-) angina(-) DOE  Rhythm:Regular Rate:Normal   HLD   Neuro/Psych PSYCHIATRIC DISORDERS TIA Neuromuscular disease (Lumbar spinal stenosis)    GI/Hepatic GERD  Controlled, Diverticulosis   Endo/Other    Renal/GU CRFRenal disease     Musculoskeletal  (+) Arthritis , Osteoarthritis,    Abdominal   Peds  Hematology   Anesthesia Other Findings   Reproductive/Obstetrics                            Anesthesia Physical Anesthesia Plan  ASA: III  Anesthesia Plan: MAC   Post-op Pain Management:    Induction: Intravenous  PONV Risk Score and Plan: 1 and TIVA, Midazolam and Treatment may vary due to age or medical condition  Airway Management Planned: Nasal Cannula  Additional Equipment:   Intra-op Plan:   Post-operative Plan:   Informed Consent: I have reviewed the patients History and Physical, chart, labs and discussed the procedure including the risks, benefits and alternatives for the proposed anesthesia with the patient or authorized representative who has indicated his/her understanding and acceptance.       Plan Discussed with: CRNA and Anesthesiologist  Anesthesia Plan Comments:         Anesthesia Quick Evaluation

## 2019-04-19 ENCOUNTER — Ambulatory Visit: Payer: Medicare Other | Admitting: Anesthesiology

## 2019-04-19 ENCOUNTER — Encounter
Admission: RE | Disposition: A | Discharge status: Home / Self Care | Payer: Self-pay | Source: Home / Self Care | Attending: Ophthalmology

## 2019-04-19 ENCOUNTER — Ambulatory Visit
Admission: RE | Admit: 2019-04-19 | Discharge: 2019-04-19 | Disposition: A | Discharge status: Home / Self Care | Payer: Medicare Other | Attending: Ophthalmology | Admitting: Ophthalmology

## 2019-04-19 ENCOUNTER — Other Ambulatory Visit: Payer: Self-pay

## 2019-04-19 ENCOUNTER — Encounter: Payer: Self-pay | Admitting: Ophthalmology

## 2019-04-19 DIAGNOSIS — H02834 Dermatochalasis of left upper eyelid: Secondary | ICD-10-CM | POA: Diagnosis not present

## 2019-04-19 DIAGNOSIS — Z87891 Personal history of nicotine dependence: Secondary | ICD-10-CM | POA: Insufficient documentation

## 2019-04-19 DIAGNOSIS — K219 Gastro-esophageal reflux disease without esophagitis: Secondary | ICD-10-CM | POA: Diagnosis not present

## 2019-04-19 DIAGNOSIS — Z8673 Personal history of transient ischemic attack (TIA), and cerebral infarction without residual deficits: Secondary | ICD-10-CM | POA: Insufficient documentation

## 2019-04-19 DIAGNOSIS — M1990 Unspecified osteoarthritis, unspecified site: Secondary | ICD-10-CM | POA: Diagnosis not present

## 2019-04-19 DIAGNOSIS — N400 Enlarged prostate without lower urinary tract symptoms: Secondary | ICD-10-CM | POA: Insufficient documentation

## 2019-04-19 DIAGNOSIS — H02403 Unspecified ptosis of bilateral eyelids: Secondary | ICD-10-CM | POA: Insufficient documentation

## 2019-04-19 DIAGNOSIS — F329 Major depressive disorder, single episode, unspecified: Secondary | ICD-10-CM | POA: Diagnosis not present

## 2019-04-19 DIAGNOSIS — R011 Cardiac murmur, unspecified: Secondary | ICD-10-CM | POA: Insufficient documentation

## 2019-04-19 DIAGNOSIS — K5790 Diverticulosis of intestine, part unspecified, without perforation or abscess without bleeding: Secondary | ICD-10-CM | POA: Insufficient documentation

## 2019-04-19 DIAGNOSIS — I10 Essential (primary) hypertension: Secondary | ICD-10-CM | POA: Insufficient documentation

## 2019-04-19 DIAGNOSIS — E785 Hyperlipidemia, unspecified: Secondary | ICD-10-CM | POA: Diagnosis not present

## 2019-04-19 DIAGNOSIS — M48061 Spinal stenosis, lumbar region without neurogenic claudication: Secondary | ICD-10-CM | POA: Diagnosis not present

## 2019-04-19 DIAGNOSIS — H02831 Dermatochalasis of right upper eyelid: Secondary | ICD-10-CM | POA: Diagnosis not present

## 2019-04-19 DIAGNOSIS — M810 Age-related osteoporosis without current pathological fracture: Secondary | ICD-10-CM | POA: Diagnosis not present

## 2019-04-19 DIAGNOSIS — Z96659 Presence of unspecified artificial knee joint: Secondary | ICD-10-CM | POA: Insufficient documentation

## 2019-04-19 DIAGNOSIS — E7800 Pure hypercholesterolemia, unspecified: Secondary | ICD-10-CM | POA: Diagnosis not present

## 2019-04-19 HISTORY — DX: Paresthesia of skin: R20.2

## 2019-04-19 HISTORY — PX: PTOSIS REPAIR: SHX6568

## 2019-04-19 HISTORY — DX: Anesthesia of skin: R20.0

## 2019-04-19 SURGERY — REPAIR, BLEPHAROPTOSIS
Anesthesia: Monitor Anesthesia Care | Site: Eye | Laterality: Bilateral

## 2019-04-19 MED ORDER — ACETAMINOPHEN 10 MG/ML IV SOLN: 1000.0000 mg | Freq: Once | INTRAVENOUS | Status: DC | PRN

## 2019-04-19 MED ORDER — LIDOCAINE-EPINEPHRINE 2 %-1:100000 IJ SOLN
INTRAMUSCULAR | Status: DC | PRN
  Administered 2019-04-19: 11:00:00 1.5 mL via OPHTHALMIC

## 2019-04-19 MED ORDER — ERYTHROMYCIN 5 MG/GM OP OINT
TOPICAL_OINTMENT | OPHTHALMIC | Status: DC | PRN
  Administered 2019-04-19: 1 via OPHTHALMIC

## 2019-04-19 MED ORDER — ONDANSETRON HCL 4 MG/2ML IJ SOLN: 4.0000 mg | Freq: Once | INTRAMUSCULAR | Status: DC | PRN

## 2019-04-19 MED ORDER — MIDAZOLAM HCL 2 MG/2ML IJ SOLN
INTRAMUSCULAR | Status: DC | PRN
  Administered 2019-04-19: .5 mg via INTRAVENOUS

## 2019-04-19 MED ORDER — LACTATED RINGERS IV SOLN
100.0000 mL/h | INTRAVENOUS | Status: DC
  Administered 2019-04-19: 100 mL/h via INTRAVENOUS

## 2019-04-19 MED ORDER — ALFENTANIL 500 MCG/ML IJ INJ
INJECTION | INTRAVENOUS | Status: DC | PRN
  Administered 2019-04-19: 200 ug via INTRAVENOUS
  Administered 2019-04-19: 500 ug via INTRAVENOUS

## 2019-04-19 MED ORDER — TRAMADOL HCL 50 MG PO TABS: ORAL_TABLET | ORAL | 0 refills | Status: DC

## 2019-04-19 MED ORDER — TETRACAINE HCL 0.5 % OP SOLN
OPHTHALMIC | Status: DC | PRN
  Administered 2019-04-19: 2 [drp] via OPHTHALMIC

## 2019-04-19 MED ORDER — LABETALOL HCL 5 MG/ML IV SOLN
INTRAVENOUS | Status: DC | PRN
  Administered 2019-04-19 (×2): 5 mg via INTRAVENOUS

## 2019-04-19 MED ORDER — ERYTHROMYCIN 5 MG/GM OP OINT: TOPICAL_OINTMENT | OPHTHALMIC | 2 refills | Status: DC

## 2019-04-19 MED ORDER — BSS IO SOLN
INTRAOCULAR | Status: DC | PRN
  Administered 2019-04-19: 15 mL

## 2019-04-19 SURGICAL SUPPLY — 34 items
APPLICATOR COTTON TIP WD 3 STR (MISCELLANEOUS) ×4 IMPLANT
BLADE SURG 15 STRL LF DISP TIS (BLADE) ×1 IMPLANT
BLADE SURG 15 STRL SS (BLADE) ×1
BNDG EYE OVAL (GAUZE/BANDAGES/DRESSINGS) ×8 IMPLANT
CORD BIP STRL DISP 12FT (MISCELLANEOUS) ×2 IMPLANT
DRAPE HEAD BAR (DRAPES) ×2 IMPLANT
GAUZE SPONGE 4X4 12PLY STRL (GAUZE/BANDAGES/DRESSINGS) ×2 IMPLANT
GLOVE SURG LX 7.0 MICRO (GLOVE) ×3
GLOVE SURG LX STRL 7.0 MICRO (GLOVE) ×3 IMPLANT
MARKER SKIN XFINE TIP W/RULER (MISCELLANEOUS) ×2 IMPLANT
NEEDLE FILTER BLUNT 18X 1/2SAF (NEEDLE) ×1
NEEDLE FILTER BLUNT 18X1 1/2 (NEEDLE) ×1 IMPLANT
NEEDLE HYPO 30X.5 LL (NEEDLE) ×4 IMPLANT
PACK ENT CUSTOM (PACKS) ×2 IMPLANT
SOL PREP PVP 2OZ (MISCELLANEOUS) ×2
SOLUTION PREP PVP 2OZ (MISCELLANEOUS) ×1 IMPLANT
SPONGE GAUZE 2X2 8PLY STRL LF (GAUZE/BANDAGES/DRESSINGS) ×20 IMPLANT
SUT CHROMIC 4-0 (SUTURE)
SUT CHROMIC 4-0 M2 12X2 ARM (SUTURE)
SUT CHROMIC 5 0 P 3 (SUTURE) IMPLANT
SUT ETHILON 4 0 CL P 3 (SUTURE) IMPLANT
SUT GUT PLAIN 6-0 1X18 ABS (SUTURE) ×2 IMPLANT
SUT MERSILENE 4-0 S-2 (SUTURE) IMPLANT
SUT PDS AB 4-0 P3 18 (SUTURE) IMPLANT
SUT PROLENE 5 0 P 3 (SUTURE) ×2 IMPLANT
SUT PROLENE 6 0 P 1 18 (SUTURE) ×4 IMPLANT
SUT SILK 4 0 G 3 (SUTURE) IMPLANT
SUT VIC AB 5-0 P-3 18X BRD (SUTURE) IMPLANT
SUT VIC AB 5-0 P3 18 (SUTURE)
SUT VICRYL 7 0 TG140 8 (SUTURE) IMPLANT
SUTURE CHRMC 4-0 M2 12X2 ARM (SUTURE) IMPLANT
SYR 10ML LL (SYRINGE) ×2 IMPLANT
SYR 3ML LL SCALE MARK (SYRINGE) ×2 IMPLANT
WATER STERILE IRR 250ML POUR (IV SOLUTION) ×2 IMPLANT

## 2019-04-19 NOTE — Anesthesia Procedure Notes (Signed)
Procedure Name: MAC Date/Time: 04/19/2019 10:27 AM Performed by: Cameron Ali, CRNA Pre-anesthesia Checklist: Patient identified, Emergency Drugs available, Suction available, Timeout performed and Patient being monitored Patient Re-evaluated:Patient Re-evaluated prior to induction Oxygen Delivery Method: Nasal cannula Placement Confirmation: positive ETCO2

## 2019-04-19 NOTE — Op Note (Signed)
Preoperative Diagnosis:  1. Visually significant blepharoptosis both  Upper Eyelid(s) 2. Visually significant dermatochalasis both  Upper Eyelid(s)  Postoperative Diagnosis:  Same.  Procedure(s) Performed:   1. Blepharoptosis repair with levator aponeurosis advancement bilateral  Upper Eyelid(s) 2. Upper eyelid blepharoplasty with excess skin excision  bilateral  Upper Eyelid(s)  Surgeon: Philis Pique. [name], M.D.  Assistants: none  Anesthesia: MAC  Specimens: None.  Estimated Blood Loss: Minimal.  Complications: None.  Operative Findings: None Dictated  Procedure:   Allergies were reviewed and the patient Atorvastatin, Pravastatin sodium, and Statins.   After the risks, benefits, complications and alternatives were discussed with the patient, appropriate informed consent was obtained.  While seated in an upright position and looking in primary gaze, the mid pupillary line was marked on the upper eyelid margins bilaterally. The patient was then brought to the operating suite and reclined supine.  Timeout was conducted and the patient was sedated.  Local anesthetic consisting of a 50-50 mixture of 2% lidocaine with epinephrine and 0.75% bupivacaine with added Hylenex was injected subcutaneously to both  upper eyelid(s). After adequate local was instilled, the patient was prepped and draped in the usual sterile fashion for eyelid surgery.   Attention was turned to the upper eyelids. A 20m upper eyelid crease incision line was marked with calipers on both  upper eyelid(s).  A pinch test was used to estimate the amount of excess skin to remove and this was marked in standard blepharoplasty style fashion. Attention was turned to the  right  upper eyelid. A #15 blade was used to open the premarked incision line. A Skin only flap was excised and hemostasis was obtained with bipolar cautery.   Westcott scissors were then used to transect through orbicularis down to the tarsal plate. Epitarsus  was dissected to create a smooth surface to suture to. Dissection was then carried superiorly in the plane between orbicularis and orbital septum. Once the preaponeurotic fat pocket was identified, the orbital septum was opened. This revealed the levator and its aponeurosis.    Attention was then turned to the opposite eyelid where the same procedure was performed in the same manner. Hemostasis was obtained with bipolar cautery throughout.   3 interrupted 6-0 Prolene sutures were then passed partial thickness through the tarsal plates of both  upper eyelid(s). These sutures were placed in line with the mid pupillary, medial limbal, and lateral limbal lines. The sutures were fixed to the levator aponeurosis and adjusted until a nice lid height and contour were achieved. Once nice symmetry was achieved, the skin incisions were closed with a running 6-0 plain gut suture. The patient tolerated the procedure well.  Erythromycin ophthalmic ophthalmic ointment was applied to the incision site(s) followed by ice packs. The patient was taken to the recovery area where he recovered without difficulty.  Post-Op Plan/Instructions:   The patient was instructed to use ice packs frequently for the next 48 hours. He was instructed to use Erythromycin ophthalmic ophthalmic ointment on his incisions 4 times a day for the next 12 to 14 days. Hewas given a prescription for tramadol (or similar) for pain control should Tylenol not be effective. [facility] [place] [place] [facility] or sooner as needed for problems.  Stephen Garrett M. FVickki Muff M.D. Ophthalmology

## 2019-04-19 NOTE — Interval H&P Note (Signed)
History and Physical Interval Note:  04/19/2019 10:11 AM  Stephen Garrett  has presented today for surgery, with the diagnosis of H02.403 Ptosis.  The various methods of treatment have been discussed with the patient and family. After consideration of risks, benefits and other options for treatment, the patient has consented to  Procedure(s): BLEPHAROPTOSIS REPAIR; RESECT SKIN (Bilateral) as a surgical intervention.  The patient's history has been reviewed, patient examined, no change in status, stable for surgery.  I have reviewed the patient's chart and labs.  Questions were answered to the patient's satisfaction.     [name], Seanpaul Preece M

## 2019-04-19 NOTE — Anesthesia Postprocedure Evaluation (Signed)
Anesthesia Post Note  Patient: Stephen Garrett  Procedure(s) Performed: BLEPHAROPTOSIS REPAIR; RESECT SKIN (Bilateral Eye)     Patient location during evaluation: PACU Anesthesia Type: MAC Level of consciousness: awake and alert Pain management: pain level controlled Vital Signs Assessment: post-procedure vital signs reviewed and stable Respiratory status: spontaneous breathing, nonlabored ventilation, respiratory function stable and patient connected to nasal cannula oxygen Cardiovascular status: stable and blood pressure returned to baseline Postop Assessment: no apparent nausea or vomiting Anesthetic complications: no    Lauralie Blacksher A  Elyce Zollinger

## 2019-04-19 NOTE — H&P (Signed)
  See the history and physical completed at [facility] on 03/25/19  and scanned into the chart.

## 2019-04-19 NOTE — Transfer of Care (Signed)
Immediate Anesthesia Transfer of Care Note  Patient: Stephen Garrett  Procedure(s) Performed: BLEPHAROPTOSIS REPAIR; RESECT SKIN (Bilateral Eye)  Patient Location: PACU  Anesthesia Type: MAC  Level of Consciousness: awake, alert  and patient cooperative  Airway and Oxygen Therapy: Patient Spontanous Breathing and Patient connected to supplemental oxygen  Post-op Assessment: Post-op Vital signs reviewed, Patient's Cardiovascular Status Stable, Respiratory Function Stable, Patent Airway and No signs of Nausea or vomiting  Post-op Vital Signs: Reviewed and stable  Complications: No apparent anesthesia complications

## 2019-04-22 ENCOUNTER — Encounter: Payer: Self-pay | Admitting: *Deleted

## 2019-04-25 ENCOUNTER — Other Ambulatory Visit: Payer: Self-pay | Admitting: Internal Medicine
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
## 2019-04-30 NOTE — Addendum Note (Signed)
Addended by: Viviana Simpler I on: 04/30/2019 08:54 AM   Modules accepted: Orders

## 2019-05-03 ENCOUNTER — Telehealth: Payer: Self-pay
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
## 2019-06-24 ENCOUNTER — Other Ambulatory Visit: Payer: Self-pay | Admitting: Internal Medicine

## 2019-06-24 NOTE — Telephone Encounter (Signed)
Last filled 01-14-19 #180 Last OV 04-08-19 Next OV 12-04-19 [facility]

## 2019-06-28 DIAGNOSIS — Z0279 Encounter for issue of other medical certificate: Secondary | ICD-10-CM

## 2019-06-28 NOTE — Telephone Encounter (Signed)
Form is done Please call him for pick up

## 2019-06-28 NOTE — Telephone Encounter (Signed)
Patient notified form is ready for pick up and $20 charge. °

## 2019-07-28 ENCOUNTER — Other Ambulatory Visit: Payer: Self-pay | Admitting: Internal Medicine

## 2019-08-04 ENCOUNTER — Other Ambulatory Visit: Payer: Self-pay | Admitting: Internal Medicine

## 2019-08-06 ENCOUNTER — Other Ambulatory Visit: Payer: Self-pay

## 2019-08-06 ENCOUNTER — Ambulatory Visit: Payer: Medicare Other | Admitting: Nurse Practitioner

## 2019-08-06 ENCOUNTER — Encounter: Payer: Self-pay | Admitting: Nurse Practitioner

## 2019-08-06 VITALS — BP 129/79 | HR 79 | Temp 98.1°F | Ht 66.0 in | Wt 178.0 lb

## 2019-08-06 DIAGNOSIS — S81811A Laceration without foreign body, right lower leg, initial encounter: Secondary | ICD-10-CM

## 2019-08-06 DIAGNOSIS — L03115 Cellulitis of right lower limb: Secondary | ICD-10-CM

## 2019-08-06 MED ORDER — DOXYCYCLINE HYCLATE 100 MG PO TABS: 100.0000 mg | ORAL_TABLET | Freq: Two times a day (BID) | ORAL | 0 refills | Status: DC

## 2019-08-06 NOTE — Progress Notes (Signed)
Careteam: Patient Care Team: [name], MD as PCP - General  Advanced Directive information    Allergies  Allergen Reactions  . Atorvastatin     REACTION: raises blood pressure  . Pravastatin Sodium     REACTION: raises blood pressure  . Statins     REACTION: raises bp    Chief Complaint  Patient presents with  . Acute Visit    Skin tear right lower leg     HPI: Patient is a 86 y.o. male seen in today at the [facility] for evaluation of skin tear to right lower leg. Reports he walks too fast and runs into things. 2 weeks ago hit a chair with his right lower leg causing a skin tear. Overall area is healing but he is concerned over infection due to delayed healing time and redness noted to right LE that is new. No pain or fever noted.  He reports he was not sure how to take care of it properly and looking for recommendations on this as well.  He has had more swelling to right LE since injury- swelling always goes down in morning but back by the afternoon.   Review of Systems:  Review of Systems  Constitutional: Negative for chills, fever and malaise/fatigue.  HENT: Positive for hearing loss.   Respiratory: Negative for shortness of breath.   Cardiovascular: Negative for palpitations.  Skin: Negative for itching and rash.       Skin tear to right lower leg  Neurological: Negative for dizziness, weakness and headaches.    Past Medical History:  Diagnosis Date  . Actinic keratosis   . BPH (benign prostatic hypertrophy)   . Diseases of lips   . Diverticulosis of colon 06/2003  . ED (erectile dysfunction)   . GERD (gastroesophageal reflux disease)   . Heart murmur    as child  . HLD (hyperlipidemia)   . HOH (hard of hearing)    Cochlear implant right, hearing aid left  . HTN (hypertension)   . Numbness and tingling in both hands    intermittent, trying braces   . Osteoarthritis of knee    severe, bilat  . Renal insufficiency   . Restless legs  syndrome (RLS)   . Sleep disturbance, unspecified   . Spinal stenosis, lumbar region, without neurogenic claudication    Past Surgical History:  Procedure Laterality Date  . CATARACT EXTRACTION, BILATERAL  2011   Dr. Thomasene Ripple  . COCHLEAR IMPLANT Right 02/15/2017   Procedure: RIGHT COCHLEAR IMPLANT;  Surgeon: Vicie Mutters, MD;  Location: [place];  Service: ENT;  Laterality: Right;  . FINGER SURGERY     Right 4th finger  . [facility]  10/2009   Dr. Bary Castilla  . LUMBAR SPINE SURGERY  06/2005   stenosis repair  . PTOSIS REPAIR Bilateral 04/19/2019   Procedure: BLEPHAROPTOSIS REPAIR; RESECT SKIN;  Surgeon: Karle Starch, MD;  Location: [place];  Service: Ophthalmology;  Laterality: Bilateral;  . TONSILLECTOMY  childhood  . TOTAL KNEE ARTHROPLASTY  06/2008   bilat (Dr. Marry Guan)   Social History:   reports that he quit smoking about 49 years ago. He has a 22.50 pack-year smoking history. He has never used smokeless tobacco. He reports current alcohol use. He reports that he does not use drugs.  Family History  Problem Relation Age of Onset  . Other Father        "clogged arteries"  . Hypertension Mother  Medications: Patient's Medications  New Prescriptions   No medications on file  Previous Medications   AMLODIPINE (NORVASC) 5 MG TABLET    TAKE 1 TABLET BY MOUTH DAILY   ASPIRIN EC 81 MG EC TABLET    Take 1 tablet (81 mg total) by mouth daily.   BUPROPION (WELLBUTRIN XL) 150 MG 24 HR TABLET    TAKE 1 TABLET BY MOUTH DAILY   CLONAZEPAM (KLONOPIN) 0.5 MG TABLET    TAKE ONE TO TWO TABLETS AT BEDTIME   ERYTHROMYCIN OPHTHALMIC OINTMENT    Apply to sutures 4 times a day for 10-12 days.  Discontinue if allergy develops and call our office   FINASTERIDE (PROSCAR) 5 MG TABLET    TAKE ONE TABLET BY MOUTH EVERY DAY   HYDROCHLOROTHIAZIDE (HYDRODIURIL) 25 MG TABLET    TAKE ONE TABLET EVERY DAY   LOSARTAN (COZAAR) 100 MG TABLET    TAKE 1 TABLET BY MOUTH DAILY   MULTIPLE  VITAMIN (MULTIVITAMIN) CAPSULE    Take 1 capsule by mouth daily.     OMEPRAZOLE (PRILOSEC) 40 MG CAPSULE    TAKE 1 CAPSULE BY MOUTH ONCE DAILY   POLYETHYLENE GLYCOL (MIRALAX / GLYCOLAX) PACKET    Take 17 g by mouth daily.   PRAMIPEXOLE (MIRAPEX) 1 MG TABLET    TAKE ONE TABLET AT DINNER AND ONE TABLETAT BEDTIME   PYRIDOXINE (B-6) 100 MG TABLET    Take 100 mg by mouth daily.   TAMSULOSIN (FLOMAX) 0.4 MG CAPS CAPSULE    TAKE 1 CAPSULE EVERY DAY   TRAMADOL (ULTRAM) 50 MG TABLET    Take 1 every 4-6 hours as needed for pain not controlled by Tylenol  Modified Medications   No medications on file  Discontinued Medications   No medications on file    Physical Exam:  Vitals:   08/06/19 1503  BP: 129/79  Pulse: 79  Temp: 98.1 F (36.7 C)  TempSrc: Temporal  SpO2: 97%  Weight: 178 lb (80.7 kg)  Height: 5\' 6"  (1.676 m)   Body mass index is 28.73 kg/m. Wt Readings from Last 3 Encounters:  08/06/19 178 lb (80.7 kg)  04/19/19 180 lb (81.6 kg)  04/08/19 182 lb (82.6 kg)    Physical Exam Constitutional:      General: He is not in acute distress.    Appearance: He is well-developed. He is not diaphoretic.  HENT:     Head: Normocephalic and atraumatic.     Mouth/Throat:     Pharynx: No oropharyngeal exudate.  Musculoskeletal:        General: No tenderness.  Skin:    General: Skin is warm and dry.     Findings: Erythema (to right lower leg) present.     Comments: 2 skin tears noted to lateral right lower leg.  1- dime sized area, without drainage 2- 2 cm linear skin tear, no drainage noted No warm noted or tenderness noted however swelling and redness appreciated.   Neurological:     Mental Status: He is alert and oriented to person, place, and time.     Labs reviewed: Basic Metabolic Panel: Recent Labs    11/27/18 0856  NA 140  K 3.7  CL 102  CO2 29  GLUCOSE 102*  BUN 27*  CREATININE 1.44  CALCIUM 9.8   Liver Function Tests: Recent Labs    11/27/18 0856  AST 24    ALT 17  ALKPHOS 88  BILITOT 0.6  PROT 6.3  ALBUMIN 4.1   No  results for input(s): LIPASE, AMYLASE in the last 8760 hours. No results for input(s): AMMONIA in the last 8760 hours. CBC: Recent Labs    11/27/18 0856  WBC 5.8  HGB 13.0  HCT 39.0  MCV 91.4  PLT 187.0   Lipid Panel: No results for input(s): CHOL, HDL, LDLCALC, TRIG, CHOLHDL, LDLDIRECT in the last 8760 hours. TSH: No results for input(s): TSH in the last 8760 hours. A1C: No results found for: HGBA1C   Assessment/Plan 1. Cellulitis of right lower leg -redness and swelling to right lower leg due to skin tears. Reviewed proper way to clean skin tears and will start doxycycline pO BID to take with food -also to elevate legs to level of heart when sitting. - doxycycline (VIBRA-TABS) 100 MG tablet; Take 1 tablet (100 mg total) by mouth 2 (two) times daily.  Dispense: 14 tablet; Refill: 0  2. Tear of skin of multiple sites of right lower extremity, initial encounter Cleaned area with saline. Applied xeroform with non-stick and keflex to secure.   Next appt: 1 week, strict return precautions discussed with pt if leg swelling, redness worsens or start having pain/fever or skin tear gets worse Cydney Alvarenga K. Holden, [facility] [phone]

## 2019-08-06 NOTE — Patient Instructions (Signed)
To make sure bandage does not get wet-- we do not want a wet dressing next to your skin  To change xeroform (yellow material) every 3 days or if soiled- cover with nonstick dressing and then wrap to secure dressing  To start doxycyline (prescription called into pharmacy) twice daily- take with food to avoid GI upset

## 2019-08-11 ENCOUNTER — Other Ambulatory Visit: Payer: Self-pay | Admitting: Internal Medicine

## 2019-08-13 ENCOUNTER — Ambulatory Visit: Payer: Self-pay | Admitting: Nurse Practitioner

## 2019-08-13 ENCOUNTER — Ambulatory Visit: Payer: Medicare Other | Admitting: Nurse Practitioner

## 2019-09-15 ENCOUNTER — Other Ambulatory Visit: Payer: Self-pay | Admitting: Internal Medicine

## 2019-09-24 DIAGNOSIS — H903 Sensorineural hearing loss, bilateral: Secondary | ICD-10-CM | POA: Diagnosis not present

## 2019-10-21 ENCOUNTER — Other Ambulatory Visit: Payer: Self-pay | Admitting: Internal Medicine

## 2019-10-21 NOTE — Telephone Encounter (Signed)
Last filled 06-24-19 #180 Last OV 04-08-19 Next OV 12-04-19 [facility]

## 2019-12-04 ENCOUNTER — Ambulatory Visit (INDEPENDENT_AMBULATORY_CARE_PROVIDER_SITE_OTHER): Payer: Medicare Other | Admitting: Internal Medicine

## 2019-12-04 ENCOUNTER — Other Ambulatory Visit: Payer: Self-pay

## 2019-12-04 ENCOUNTER — Encounter: Payer: Self-pay | Admitting: Internal Medicine

## 2019-12-04 VITALS — BP 132/84 | HR 68 | Temp 97.6°F | Ht 67.0 in | Wt 182.0 lb

## 2019-12-04 DIAGNOSIS — Z7189 Other specified counseling: Secondary | ICD-10-CM

## 2019-12-04 DIAGNOSIS — G2581 Restless legs syndrome: Secondary | ICD-10-CM

## 2019-12-04 DIAGNOSIS — Z0000 Encounter for general adult medical examination without abnormal findings: Secondary | ICD-10-CM | POA: Diagnosis not present

## 2019-12-04 DIAGNOSIS — I10 Essential (primary) hypertension: Secondary | ICD-10-CM

## 2019-12-04 DIAGNOSIS — F39 Unspecified mood [affective] disorder: Secondary | ICD-10-CM

## 2019-12-04 DIAGNOSIS — N1831 Chronic kidney disease, stage 3a: Secondary | ICD-10-CM

## 2019-12-04 DIAGNOSIS — N138 Other obstructive and reflux uropathy: Secondary | ICD-10-CM

## 2019-12-04 DIAGNOSIS — N401 Enlarged prostate with lower urinary tract symptoms: Secondary | ICD-10-CM | POA: Diagnosis not present

## 2019-12-04 LAB — CBC
HCT: 38.7 % — ABNORMAL LOW (ref 39.0–52.0)
Hemoglobin: 12.9 g/dL — ABNORMAL LOW (ref 13.0–17.0)
MCHC: 33.5 g/dL (ref 30.0–36.0)
MCV: 91 fl (ref 78.0–100.0)
Platelets: 161 10*3/uL (ref 150.0–400.0)
RBC: 4.25 Mil/uL (ref 4.22–5.81)
RDW: 14.5 % (ref 11.5–15.5)
WBC: 5.9 10*3/uL (ref 4.0–10.5)

## 2019-12-04 LAB — VITAMIN D 25 HYDROXY (VIT D DEFICIENCY, FRACTURES): VITD: 42.27 ng/mL (ref 30.00–100.00)

## 2019-12-04 LAB — HEPATIC FUNCTION PANEL
ALT: 17 U/L (ref 0–53)
AST: 27 U/L (ref 0–37)
Albumin: 4.1 g/dL (ref 3.5–5.2)
Alkaline Phosphatase: 89 U/L (ref 39–117)
Bilirubin, Direct: 0.1 mg/dL (ref 0.0–0.3)
Total Bilirubin: 0.7 mg/dL (ref 0.2–1.2)
Total Protein: 6.3 g/dL (ref 6.0–8.3)

## 2019-12-04 LAB — RENAL FUNCTION PANEL
Albumin: 4.1 g/dL (ref 3.5–5.2)
BUN: 29 mg/dL — ABNORMAL HIGH (ref 6–23)
CO2: 33 mEq/L — ABNORMAL HIGH (ref 19–32)
Calcium: 9.6 mg/dL (ref 8.4–10.5)
Chloride: 101 mEq/L (ref 96–112)
Creatinine, Ser: 1.55 mg/dL — ABNORMAL HIGH (ref 0.40–1.50)
GFR: 37.69 mL/min — ABNORMAL LOW (ref >60.00)
Glucose, Bld: 97 mg/dL (ref 70–99)
Phosphorus: 3.7 mg/dL (ref 2.3–4.6)
Potassium: 4.3 mEq/L (ref 3.5–5.1)
Sodium: 139 mEq/L (ref 135–145)

## 2019-12-04 NOTE — Assessment & Plan Note (Signed)
BP Readings from Last 3 Encounters:  12/04/19 132/84  08/06/19 129/79  04/19/19 (!) 152/83   Good control on amlodipine and HCTZ Will check labs

## 2019-12-04 NOTE — Assessment & Plan Note (Signed)
Sleeps okay with nightly clonazepam and mirapex

## 2019-12-04 NOTE — Assessment & Plan Note (Signed)
Mood has been good on the bupropion Will continue

## 2019-12-04 NOTE — Assessment & Plan Note (Signed)
Last GFR 45 Will check labs, PTH, etc No ARB/ACEI at his age

## 2019-12-04 NOTE — Assessment & Plan Note (Signed)
See social history 

## 2019-12-04 NOTE — Assessment & Plan Note (Signed)
I have personally reviewed the Medicare Annual Wellness questionnaire and have noted 1. The patient's medical and social history 2. Their use of alcohol, tobacco or illicit drugs 3. Their current medications and supplements 4. The patient's functional ability including ADL's, fall risks, home safety risks and hearing or visual             impairment. 5. Diet and physical activities 6. Evidence for depression or mood disorders  The patients weight, height, BMI and visual acuity have been recorded in the chart I have made referrals, counseling and provided education to the patient based review of the above and I have provided the pt with a written personalized care plan for preventive services.  I have provided you with a copy of your personalized plan for preventive services. Please take the time to review along with your updated medication list.  No cancer screening due to age Flu vaccine at [place] booster when available Discussed ongoing exercise

## 2019-12-04 NOTE — Progress Notes (Signed)
Subjective:    Patient ID: Stephen Garrett, male    DOB: Feb 17, 1924, 86 y.o.   MRN: [id number]  HPI Here for Medicare wellness visit and follow up of chronic health conditions This visit occurred during the SARS-CoV-2 public health emergency.  Safety protocols were in place, including screening questions prior to the visit, additional usage of staff PPE, and extensive cleaning of exam room while observing appropriate contact time as indicated for disinfecting solutions.   Reviewed form and advanced directives Reviewed other doctors Getting injection in left eye---due to hemorrhage (reduced vision there). Right eye is good with correction Hearing has worsened a bit--seeing new audiologist to help with aide (and has the cochlear implant) Still enjoys occasional drink of alcohol No tobacco Tries to exercise regularly--still tennis weekly and home exercise No falls Independent with instrumental ADLs Memory never good---mild decline but no functional issues  No depression lately Still on bupropion daily Not anxious Uses clonazepam for restless legs---mostly sleeps okay on this and pramipexole  No chest pain No palpitations  No SOB in general--but is slower in tennis and takes longer to recover No headaches Mild edema in day---always better in the day  Known CKD---last GFR 45  Voids okay Nocturia x 2-3---stable Flow is okay---seems to empty well  Current Outpatient Medications on File Prior to Visit  Medication Sig Dispense Refill  . amLODipine (NORVASC) 5 MG tablet TAKE ONE TABLET BY MOUTH EVERY DAY 90 tablet 3  . aspirin EC 81 MG EC tablet Take 1 tablet (81 mg total) by mouth daily. 30 tablet 1  . buPROPion (WELLBUTRIN XL) 150 MG 24 hr tablet TAKE 1 TABLET BY MOUTH DAILY 90 tablet 3  . clonazePAM (KLONOPIN) 0.5 MG tablet TAKE ONE TO TWO TABLETS AT BEDTIME 180 tablet 0  . finasteride (PROSCAR) 5 MG tablet TAKE ONE TABLET BY MOUTH EVERY DAY 90 tablet 3  . hydrochlorothiazide  (HYDRODIURIL) 25 MG tablet TAKE ONE TABLET EVERY DAY 90 tablet 3  . losartan (COZAAR) 100 MG tablet TAKE ONE TABLET EVERY DAY 90 tablet 3  . Multiple Vitamin (MULTIVITAMIN) capsule Take 1 capsule by mouth daily.      Marland Kitchen omeprazole (PRILOSEC) 40 MG capsule TAKE 1 CAPSULE BY MOUTH ONCE DAILY 90 capsule 3  . polyethylene glycol (MIRALAX / GLYCOLAX) packet Take 17 g by mouth daily.    . pramipexole (MIRAPEX) 1 MG tablet TAKE ONE TABLET AT DINNER AND ONE TABLETAT BEDTIME 180 tablet 3  . pyridoxine (B-6) 100 MG tablet Take 100 mg by mouth daily.    . tamsulosin (FLOMAX) 0.4 MG CAPS capsule TAKE 1 CAPSULE EVERY DAY 90 capsule 3  . [DISCONTINUED] citalopram (CELEXA) 10 MG tablet Take 1 tablet (10 mg total) by mouth daily. 30 tablet 5   No current facility-administered medications on file prior to visit.    Allergies  Allergen Reactions  . Atorvastatin     REACTION: raises blood pressure  . Pravastatin Sodium     REACTION: raises blood pressure  . Statins     REACTION: raises bp    Past Medical History:  Diagnosis Date  . Actinic keratosis   . BPH (benign prostatic hypertrophy)   . Diseases of lips   . Diverticulosis of colon 06/2003  . ED (erectile dysfunction)   . GERD (gastroesophageal reflux disease)   . Heart murmur    as child  . HLD (hyperlipidemia)   . HOH (hard of hearing)    Cochlear implant right, hearing aid left  .  HTN (hypertension)   . Numbness and tingling in both hands    intermittent, trying braces   . Osteoarthritis of knee    severe, bilat  . Renal insufficiency   . Restless legs syndrome (RLS)   . Sleep disturbance, unspecified   . Spinal stenosis, lumbar region, without neurogenic claudication     Past Surgical History:  Procedure Laterality Date  . CATARACT EXTRACTION, BILATERAL  2011   Dr. Thomasene Ripple  . COCHLEAR IMPLANT Right 02/15/2017   Procedure: RIGHT COCHLEAR IMPLANT;  Surgeon: Vicie Mutters, MD;  Location: [place];  Service: ENT;   Laterality: Right;  . FINGER SURGERY     Right 4th finger  . [facility]  10/2009   Dr. Bary Castilla  . LUMBAR SPINE SURGERY  06/2005   stenosis repair  . PTOSIS REPAIR Bilateral 04/19/2019   Procedure: BLEPHAROPTOSIS REPAIR; RESECT SKIN;  Surgeon: Karle Starch, MD;  Location: [place];  Service: Ophthalmology;  Laterality: Bilateral;  . TONSILLECTOMY  childhood  . TOTAL KNEE ARTHROPLASTY  06/2008   bilat (Dr. Marry Guan)    Family History  Problem Relation Age of Onset  . Other Father        "clogged arteries"  . Hypertension Mother     Social History   Socioeconomic History  . Marital status: Widowed    Spouse name: Not on file  . Number of children: 2  . Years of education: Not on file  . Highest education level: Not on file  Occupational History  . Occupation: Retired  Tobacco Use  . Smoking status: Former Smoker    Packs/day: 0.75    Years: 30.00    Pack years: 22.50    Quit date: 02/28/1970    Years since quitting: 49.7  . Smokeless tobacco: Never Used  Vaping Use  . Vaping Use: Never used  Substance and Sexual Activity  . Alcohol use: Yes    Alcohol/week: 0.0 standard drinks    Comment: 1-2 glasses wine/daily  . Drug use: No  . Sexual activity: Not on file  Other Topics Concern  . Not on file  Social History Narrative   Widowed 2010   2 adopted children   Has living will   Daughter is now health care power of attorney   Would accept resuscitation but no prolonged ventilation   No tube feeds if cognitively unaware   Social Determinants of Health   Financial Resource Strain:   . Difficulty of Paying Living Expenses: Not on file  Food Insecurity:   . Worried About Charity fundraiser in the Last Year: Not on file  . Ran Out of Food in the Last Year: Not on file  Transportation Needs:   . Lack of Transportation (Medical): Not on file  . Lack of Transportation (Non-Medical): Not on file  Physical Activity:   . Days of Exercise per Week: Not on file  .  Minutes of Exercise per Session: Not on file  Stress:   . Feeling of Stress : Not on file  Social Connections:   . Frequency of Communication with Friends and Family: Not on file  . Frequency of Social Gatherings with Friends and Family: Not on file  . Attends Religious Services: Not on file  . Active Member of Clubs or Organizations: Not on file  . Attends Archivist Meetings: Not on file  . Marital Status: Not on file  Intimate Partner Violence:   . Fear of Current or Ex-Partner: Not on  file  . Emotionally Abused: Not on file  . Physically Abused: Not on file  . Sexually Abused: Not on file   Review of Systems Appetite is good Weight is stable Sleeps fairly well Wears seat belt Teeth are okay---recent extraction No skin problems Bowels move okay on miralax. No blood No heartburn or dysphagia    Objective:   Physical Exam Constitutional:      Appearance: Normal appearance.     Comments: Still HOH  HENT:     Mouth/Throat:     Comments: No lesions Eyes:     Conjunctiva/sclera: Conjunctivae normal.     Pupils: Pupils are equal, round, and reactive to light.  Cardiovascular:     Rate and Rhythm: Normal rate and regular rhythm.     Heart sounds: No gallop.      Comments: Faint systolic murmur Feet warm without palpable pulses Pulmonary:     Effort: Pulmonary effort is normal.     Breath sounds: No wheezing or rales.     Comments: Decreased breath sounds Abdominal:     Palpations: Abdomen is soft.     Tenderness: There is no abdominal tenderness.  Musculoskeletal:     Cervical back: Neck supple.     Comments: Slight bilateral edema up to calves (slight sock indentations, etc)  Lymphadenopathy:     Cervical: No cervical adenopathy.  Skin:    General: Skin is warm.     Findings: No rash.  Neurological:     Mental Status: He is alert and oriented to person, place, and time.     Comments: President---"Biden, Trump, Bush-----the black  fellow" [id number] Recall 2/3  Psychiatric:        Mood and Affect: Mood normal.        Behavior: Behavior normal.            Assessment & Plan:

## 2019-12-04 NOTE — Assessment & Plan Note (Signed)
Voids okay on tamsulosin and finasteride Will continue

## 2019-12-04 NOTE — Progress Notes (Signed)
Hearing Screening   125Hz  250Hz  500Hz  1000Hz  2000Hz  3000Hz  4000Hz  6000Hz  8000Hz   Right ear:           Left ear:           Comments: Hearing aid and Implant  Vision Screening Comments: September 2021

## 2019-12-05 LAB — PARATHYROID HORMONE, INTACT (NO CA): PTH: 55 pg/mL (ref 14–64)

## 2020-01-01 ENCOUNTER — Emergency Department: Payer: Medicare Other

## 2020-01-01 ENCOUNTER — Other Ambulatory Visit: Payer: Self-pay

## 2020-01-01 ENCOUNTER — Emergency Department (HOSPITAL_COMMUNITY)
Admit: 2020-01-01 | Discharge: 2020-01-01 | Disposition: A | Discharge status: Home / Self Care | Payer: Medicare Other | Attending: Emergency Medicine | Admitting: Emergency Medicine

## 2020-01-01 ENCOUNTER — Inpatient Hospital Stay
Admission: EM | Admit: 2020-01-01 | Discharge: 2020-01-05 | DRG: 281 | Disposition: A | Discharge status: Home Health | Payer: Medicare Other | Attending: Internal Medicine | Admitting: Internal Medicine

## 2020-01-01 DIAGNOSIS — I959 Hypotension, unspecified: Secondary | ICD-10-CM | POA: Diagnosis present

## 2020-01-01 DIAGNOSIS — I358 Other nonrheumatic aortic valve disorders: Secondary | ICD-10-CM | POA: Diagnosis present

## 2020-01-01 DIAGNOSIS — Z8673 Personal history of transient ischemic attack (TIA), and cerebral infarction without residual deficits: Secondary | ICD-10-CM

## 2020-01-01 DIAGNOSIS — I350 Nonrheumatic aortic (valve) stenosis: Secondary | ICD-10-CM | POA: Diagnosis present

## 2020-01-01 DIAGNOSIS — Z20822 Contact with and (suspected) exposure to covid-19: Secondary | ICD-10-CM | POA: Diagnosis present

## 2020-01-01 DIAGNOSIS — E860 Dehydration: Secondary | ICD-10-CM | POA: Diagnosis present

## 2020-01-01 DIAGNOSIS — N179 Acute kidney failure, unspecified: Secondary | ICD-10-CM | POA: Diagnosis present

## 2020-01-01 DIAGNOSIS — I872 Venous insufficiency (chronic) (peripheral): Secondary | ICD-10-CM | POA: Diagnosis present

## 2020-01-01 DIAGNOSIS — F419 Anxiety disorder, unspecified: Secondary | ICD-10-CM | POA: Diagnosis present

## 2020-01-01 DIAGNOSIS — E876 Hypokalemia: Secondary | ICD-10-CM | POA: Diagnosis present

## 2020-01-01 DIAGNOSIS — I340 Nonrheumatic mitral (valve) insufficiency: Secondary | ICD-10-CM

## 2020-01-01 DIAGNOSIS — G2581 Restless legs syndrome: Secondary | ICD-10-CM | POA: Diagnosis present

## 2020-01-01 DIAGNOSIS — D631 Anemia in chronic kidney disease: Secondary | ICD-10-CM | POA: Diagnosis present

## 2020-01-01 DIAGNOSIS — H9190 Unspecified hearing loss, unspecified ear: Secondary | ICD-10-CM | POA: Diagnosis present

## 2020-01-01 DIAGNOSIS — Z9621 Cochlear implant status: Secondary | ICD-10-CM | POA: Diagnosis present

## 2020-01-01 DIAGNOSIS — Z87891 Personal history of nicotine dependence: Secondary | ICD-10-CM

## 2020-01-01 DIAGNOSIS — K219 Gastro-esophageal reflux disease without esophagitis: Secondary | ICD-10-CM | POA: Diagnosis present

## 2020-01-01 DIAGNOSIS — E785 Hyperlipidemia, unspecified: Secondary | ICD-10-CM | POA: Diagnosis present

## 2020-01-01 DIAGNOSIS — R55 Syncope and collapse: Secondary | ICD-10-CM

## 2020-01-01 DIAGNOSIS — I214 Non-ST elevation (NSTEMI) myocardial infarction: Secondary | ICD-10-CM | POA: Diagnosis not present

## 2020-01-01 DIAGNOSIS — R001 Bradycardia, unspecified: Secondary | ICD-10-CM | POA: Diagnosis present

## 2020-01-01 DIAGNOSIS — Z8249 Family history of ischemic heart disease and other diseases of the circulatory system: Secondary | ICD-10-CM

## 2020-01-01 DIAGNOSIS — R7989 Other specified abnormal findings of blood chemistry: Secondary | ICD-10-CM

## 2020-01-01 DIAGNOSIS — Z888 Allergy status to other drugs, medicaments and biological substances status: Secondary | ICD-10-CM

## 2020-01-01 DIAGNOSIS — I4891 Unspecified atrial fibrillation: Secondary | ICD-10-CM | POA: Diagnosis present

## 2020-01-01 DIAGNOSIS — I10 Essential (primary) hypertension: Secondary | ICD-10-CM | POA: Diagnosis present

## 2020-01-01 DIAGNOSIS — Z7982 Long term (current) use of aspirin: Secondary | ICD-10-CM

## 2020-01-01 DIAGNOSIS — I5032 Chronic diastolic (congestive) heart failure: Secondary | ICD-10-CM | POA: Diagnosis present

## 2020-01-01 DIAGNOSIS — Z79899 Other long term (current) drug therapy: Secondary | ICD-10-CM

## 2020-01-01 DIAGNOSIS — D649 Anemia, unspecified: Secondary | ICD-10-CM | POA: Diagnosis not present

## 2020-01-01 DIAGNOSIS — F32A Depression, unspecified: Secondary | ICD-10-CM | POA: Diagnosis present

## 2020-01-01 DIAGNOSIS — N183 Chronic kidney disease, stage 3 unspecified: Secondary | ICD-10-CM | POA: Diagnosis present

## 2020-01-01 DIAGNOSIS — I130 Hypertensive heart and chronic kidney disease with heart failure and stage 1 through stage 4 chronic kidney disease, or unspecified chronic kidney disease: Secondary | ICD-10-CM | POA: Diagnosis present

## 2020-01-01 DIAGNOSIS — E1122 Type 2 diabetes mellitus with diabetic chronic kidney disease: Secondary | ICD-10-CM | POA: Diagnosis present

## 2020-01-01 DIAGNOSIS — I248 Other forms of acute ischemic heart disease: Secondary | ICD-10-CM | POA: Diagnosis present

## 2020-01-01 DIAGNOSIS — I361 Nonrheumatic tricuspid (valve) insufficiency: Secondary | ICD-10-CM

## 2020-01-01 DIAGNOSIS — N400 Enlarged prostate without lower urinary tract symptoms: Secondary | ICD-10-CM | POA: Diagnosis present

## 2020-01-01 DIAGNOSIS — R918 Other nonspecific abnormal finding of lung field: Secondary | ICD-10-CM | POA: Diagnosis not present

## 2020-01-01 DIAGNOSIS — E871 Hypo-osmolality and hyponatremia: Secondary | ICD-10-CM | POA: Diagnosis present

## 2020-01-01 DIAGNOSIS — Z96653 Presence of artificial knee joint, bilateral: Secondary | ICD-10-CM | POA: Diagnosis present

## 2020-01-01 DIAGNOSIS — N1832 Chronic kidney disease, stage 3b: Secondary | ICD-10-CM | POA: Diagnosis present

## 2020-01-01 DIAGNOSIS — Z8719 Personal history of other diseases of the digestive system: Secondary | ICD-10-CM

## 2020-01-01 DIAGNOSIS — I4819 Other persistent atrial fibrillation: Secondary | ICD-10-CM | POA: Diagnosis not present

## 2020-01-01 DIAGNOSIS — N189 Chronic kidney disease, unspecified: Secondary | ICD-10-CM | POA: Diagnosis present

## 2020-01-01 LAB — HEPATIC FUNCTION PANEL
ALT: 21 U/L (ref 0–44)
AST: 29 U/L (ref 15–41)
Albumin: 3.5 g/dL (ref 3.5–5.0)
Alkaline Phosphatase: 56 U/L (ref 38–126)
Bilirubin, Direct: 0.2 mg/dL (ref 0.0–0.2)
Indirect Bilirubin: 1 mg/dL — ABNORMAL HIGH (ref 0.3–0.9)
Total Bilirubin: 1.2 mg/dL (ref 0.3–1.2)
Total Protein: 6 g/dL — ABNORMAL LOW (ref 6.5–8.1)

## 2020-01-01 LAB — URINALYSIS, COMPLETE (UACMP) WITH MICROSCOPIC
Bilirubin Urine: NEGATIVE
Glucose, UA: NEGATIVE mg/dL
Hgb urine dipstick: NEGATIVE
Ketones, ur: NEGATIVE mg/dL
Leukocytes,Ua: NEGATIVE
Nitrite: NEGATIVE
Protein, ur: NEGATIVE mg/dL
Specific Gravity, Urine: 1.017 (ref 1.005–1.030)
pH: 5 (ref 5.0–8.0)

## 2020-01-01 LAB — CBC
HCT: 33.4 % — ABNORMAL LOW (ref 39.0–52.0)
Hemoglobin: 11.2 g/dL — ABNORMAL LOW (ref 13.0–17.0)
MCH: 30.4 pg (ref 26.0–34.0)
MCHC: 33.5 g/dL (ref 30.0–36.0)
MCV: 90.8 fL (ref 80.0–100.0)
Platelets: 167 10*3/uL (ref 150–400)
RBC: 3.68 MIL/uL — ABNORMAL LOW (ref 4.22–5.81)
RDW: 14.4 % (ref 11.5–15.5)
WBC: 8.9 10*3/uL (ref 4.0–10.5)
nRBC: 0 % (ref 0.0–0.2)

## 2020-01-01 LAB — APTT: aPTT: >160 seconds (ref 24–36)

## 2020-01-01 LAB — RESPIRATORY PANEL BY RT PCR (FLU A&B, COVID)
Influenza A by PCR: NEGATIVE
Influenza B by PCR: NEGATIVE
SARS Coronavirus 2 by RT PCR: NEGATIVE

## 2020-01-01 LAB — PROTIME-INR
INR: 1.1 (ref 0.8–1.2)
Prothrombin Time: 13.7 seconds (ref 11.4–15.2)

## 2020-01-01 LAB — TROPONIN I (HIGH SENSITIVITY)
Troponin I (High Sensitivity): 385 ng/L (ref <18)
Troponin I (High Sensitivity): 336 ng/L (ref <18)

## 2020-01-01 LAB — BASIC METABOLIC PANEL
Anion gap: 13 (ref 5–15)
BUN: 71 mg/dL — ABNORMAL HIGH (ref 8–23)
CO2: 23 mmol/L (ref 22–32)
Calcium: 8.9 mg/dL (ref 8.9–10.3)
Chloride: 98 mmol/L (ref 98–111)
Creatinine, Ser: 3.85 mg/dL — ABNORMAL HIGH (ref 0.61–1.24)
GFR, Estimated: 14 mL/min — ABNORMAL LOW (ref >60)
Glucose, Bld: 118 mg/dL — ABNORMAL HIGH (ref 70–99)
Potassium: 3.3 mmol/L — ABNORMAL LOW (ref 3.5–5.1)
Sodium: 134 mmol/L — ABNORMAL LOW (ref 135–145)

## 2020-01-01 LAB — TSH: TSH: 3.231 u[IU]/mL (ref 0.350–4.500)

## 2020-01-01 LAB — MAGNESIUM: Magnesium: 2.1 mg/dL (ref 1.7–2.4)

## 2020-01-01 LAB — BRAIN NATRIURETIC PEPTIDE: B Natriuretic Peptide: 634 pg/mL — ABNORMAL HIGH (ref 0.0–100.0)

## 2020-01-01 MED ORDER — METOPROLOL TARTRATE 25 MG PO TABS
25.0000 mg | ORAL_TABLET | Freq: Two times a day (BID) | ORAL | Status: DC
  Administered 2020-01-02: 25 mg via ORAL
  Filled 2020-01-01: qty 1

## 2020-01-01 MED ORDER — ONDANSETRON HCL 4 MG/2ML IJ SOLN: 4.0000 mg | Freq: Four times a day (QID) | INTRAMUSCULAR | Status: DC | PRN

## 2020-01-01 MED ORDER — HEPARIN BOLUS VIA INFUSION
4000.0000 [IU] | Freq: Once | INTRAVENOUS | Status: AC
  Administered 2020-01-01: 4000 [IU] via INTRAVENOUS
  Filled 2020-01-01: qty 4000

## 2020-01-01 MED ORDER — ACETAMINOPHEN 325 MG PO TABS: 650.0000 mg | ORAL_TABLET | ORAL | Status: DC | PRN

## 2020-01-01 MED ORDER — HEPARIN (PORCINE) 25000 UT/250ML-% IV SOLN
1150.0000 [IU]/h | INTRAVENOUS | Status: DC
  Administered 2020-01-01: 900 [IU]/h via INTRAVENOUS
  Filled 2020-01-01 (×2): qty 250

## 2020-01-01 MED ORDER — ASPIRIN 81 MG PO CHEW
324.0000 mg | CHEWABLE_TABLET | Freq: Once | ORAL | Status: AC
  Administered 2020-01-01: 324 mg via ORAL
  Filled 2020-01-01: qty 4

## 2020-01-01 MED ORDER — LACTATED RINGERS IV BOLUS: 500.0000 mL | Freq: Once | INTRAVENOUS | Status: DC

## 2020-01-01 MED ORDER — SODIUM CHLORIDE 0.9 % IV SOLN: INTRAVENOUS | Status: DC

## 2020-01-01 MED ORDER — POTASSIUM CHLORIDE CRYS ER 20 MEQ PO TBCR
40.0000 meq | EXTENDED_RELEASE_TABLET | Freq: Once | ORAL | Status: AC
  Administered 2020-01-01: 40 meq via ORAL
  Filled 2020-01-01: qty 2

## 2020-01-01 MED ORDER — ASPIRIN EC 81 MG PO TBEC
81.0000 mg | DELAYED_RELEASE_TABLET | Freq: Every day | ORAL | Status: DC
  Administered 2020-01-01 – 2020-01-03 (×3): 81 mg via ORAL
  Filled 2020-01-01 (×4): qty 1

## 2020-01-01 NOTE — ED Triage Notes (Signed)
Pt comes into the ED via [facility] from [facility] c/o a-fib (new onset).  115/94, CBG 154, 98%ra, 49 HR. Pt denies any CP or shortness of breath.  Initial BP was 94/60 and has improved with EMS.  Denies any dizziness but reports increased fatigue.

## 2020-01-01 NOTE — ED Provider Notes (Signed)
[facility] Emergency Department Provider Note  ____________________________________________   First MD Initiated Contact with Patient 01/01/20 1803     (approximate)  I have reviewed the triage vital signs and the nursing notes.   HISTORY  Chief Complaint Atrial Fibrillation   HPI Stephen Garrett is a 86 y.o. male with past medical history of HTN, HDL, BPH, GERD, RLS, TIA, and diverticulosis who presents for assessment of some low blood pressure earlier today as well as a syncopal episode and finding on ECG and is assisted living facility of new A. fib.  Patient states he also fell yesterday while he was sitting playing bridge.  He states he fell to his side and did not have LOC or any pain from that fall including headache, extremity pain, chest pain abdominal pain or back pain.  He states he broke his glasses but does not think he hit his head.  He states that today he was playing bridge with some friends again and his friend saw him pass out midsentence and they alerted medical staff at his facility who called EMS.  Patient states he currently has no symptoms including headache, earache, vision changes, vertigo, cough, shortness of breath, chest pain, abdominal pain, nausea, vomiting, diarrhea, dysuria, rash, or any extremity pain.  He does not take any blood thinners.  No prior synopsis.  No clear alleviating or aggravating factors.  He states he is taking all his medications as directed.  He has never had A. fib to his knowledge.  He denies EtOH use, also drug use, tobacco abuse.  He states he has not had much to eat or drink today and is not sure if his urine output is decreased at all.         Past Medical History:  Diagnosis Date  . Actinic keratosis   . BPH (benign prostatic hypertrophy)   . Diseases of lips   . Diverticulosis of colon 06/2003  . ED (erectile dysfunction)   . GERD (gastroesophageal reflux disease)   . Heart murmur    as child  . HLD  (hyperlipidemia)   . HOH (hard of hearing)    Cochlear implant right, hearing aid left  . HTN (hypertension)   . Numbness and tingling in both hands    intermittent, trying braces   . Osteoarthritis of knee    severe, bilat  . Renal insufficiency   . Restless legs syndrome (RLS)   . Sleep disturbance, unspecified   . Spinal stenosis, lumbar region, without neurogenic claudication     Patient Active Problem List   Diagnosis Date Noted  . Acute renal failure superimposed on stage 3 chronic kidney disease ([place]) 01/01/2020  . Hypokalemia 01/01/2020  . NSTEMI (non-ST elevated myocardial infarction) ([place]) 01/01/2020  . Diplopia 09/25/2017  . TIA (transient ischemic attack) 09/14/2017  . Retinal vein occlusion, central 02/19/2014  . Chronic venous insufficiency 02/19/2014  . Episodic mood disorder ([place]) 12/20/2013  . Advanced directives, counseling/discussion 12/20/2013  . Routine general medical examination at a health care facility 11/02/2012  . Chronic kidney disease, stage III (moderate) ([place]) 01/26/2009  . Restless legs syndrome 10/03/2008  . OSTEOARTHRITIS 12/12/2006  . SPINAL STENOSIS, LUMBAR 06/24/2006  . Essential hypertension, benign 06/21/2006  . GERD 06/21/2006  . DIVERTICULOSIS, COLON 06/21/2006  . BPH with obstruction/lower urinary tract symptoms 06/21/2006    Past Surgical History:  Procedure Laterality Date  . CATARACT EXTRACTION, BILATERAL  2011   Dr. Thomasene Ripple  . COCHLEAR IMPLANT Right 02/15/2017  Procedure: RIGHT COCHLEAR IMPLANT;  Surgeon: Vicie Mutters, MD;  Location: [place];  Service: ENT;  Laterality: Right;  . FINGER SURGERY     Right 4th finger  . [facility]  10/2009   Dr. Bary Castilla  . LUMBAR SPINE SURGERY  06/2005   stenosis repair  . PTOSIS REPAIR Bilateral 04/19/2019   Procedure: BLEPHAROPTOSIS REPAIR; RESECT SKIN;  Surgeon: Karle Starch, MD;  Location: [place];  Service: Ophthalmology;  Laterality: Bilateral;  . TONSILLECTOMY   childhood  . TOTAL KNEE ARTHROPLASTY  06/2008   bilat (Dr. Marry Guan)    Prior to Admission medications   Medication Sig Start Date End Date Taking? Authorizing Provider  amLODipine (NORVASC) 5 MG tablet TAKE ONE TABLET BY MOUTH EVERY DAY 10/21/19   Viviana Simpler I, MD  aspirin EC 81 MG EC tablet Take 1 tablet (81 mg total) by mouth daily. 09/16/17   Fritzi Mandes, MD  buPROPion (WELLBUTRIN XL) 150 MG 24 hr tablet TAKE 1 TABLET BY MOUTH DAILY 03/18/19   [name], MD  clonazePAM (KLONOPIN) 0.5 MG tablet TAKE ONE TO TWO TABLETS AT BEDTIME 10/22/19   [name], MD  finasteride (PROSCAR) 5 MG tablet TAKE ONE TABLET BY MOUTH EVERY DAY 08/05/19   [name], MD  hydrochlorothiazide (HYDRODIURIL) 25 MG tablet TAKE ONE TABLET EVERY DAY 07/30/19   [name], MD  losartan (COZAAR) 100 MG tablet TAKE ONE TABLET EVERY DAY 08/12/19   [name], MD  Multiple Vitamin (MULTIVITAMIN) capsule Take 1 capsule by mouth daily.      [provider]  omeprazole (PRILOSEC) 40 MG capsule TAKE 1 CAPSULE BY MOUTH ONCE DAILY 04/25/19   Viviana Simpler I, MD  polyethylene glycol (MIRALAX / GLYCOLAX) packet Take 17 g by mouth daily.    [provider]  pramipexole (MIRAPEX) 1 MG tablet TAKE ONE TABLET AT DINNER AND ONE TABLETAT BEDTIME 09/16/19   [name], MD  pyridoxine (B-6) 100 MG tablet Take 100 mg by mouth daily.    [provider]  tamsulosin (FLOMAX) 0.4 MG CAPS capsule TAKE 1 CAPSULE EVERY DAY 08/05/19   [name], MD  citalopram (CELEXA) 10 MG tablet Take 1 tablet (10 mg total) by mouth daily. 04/22/11 05/18/11  [name], MD    Allergies Atorvastatin, Pravastatin sodium, and Statins  Family History  Problem Relation Age of Onset  . Other Father        "clogged arteries"  . Hypertension Mother     Social History Social History   Tobacco Use  . Smoking status: Former Smoker    Packs/day: 0.75    Years: 30.00    Pack years:  22.50    Quit date: 02/28/1970    Years since quitting: 49.8  . Smokeless tobacco: Never Used  Vaping Use  . Vaping Use: Never used  Substance Use Topics  . Alcohol use: Yes    Alcohol/week: 0.0 standard drinks    Comment: 1-2 glasses wine/daily  . Drug use: No    Review of Systems  Review of Systems  Constitutional: Negative for chills and fever.  HENT: Negative for sore throat.   Eyes: Negative for pain.  Respiratory: Negative for cough and stridor.   Cardiovascular: Negative for chest pain.  Gastrointestinal: Negative for vomiting.  Genitourinary: Negative for dysuria.  Musculoskeletal: Negative for myalgias.  Skin: Negative for rash.  Neurological: Positive for loss of consciousness. Negative for seizures and headaches.  Psychiatric/Behavioral:  Negative for suicidal ideas.  All other systems reviewed and are negative.     ____________________________________________   PHYSICAL EXAM:  VITAL SIGNS: ED Triage Vitals  Enc Vitals Group     BP 01/01/20 1450 (!) 100/52     Pulse Rate 01/01/20 1450 61     Resp 01/01/20 1450 18     Temp 01/01/20 1450 98.2 F (36.8 C)     Temp Source 01/01/20 1450 Oral     SpO2 01/01/20 1450 97 %     Weight 01/01/20 1451 172 lb (78 kg)     Height 01/01/20 1451 5\' 6"  (1.676 m)     Head Circumference --      Peak Flow --      Pain Score 01/01/20 1451 0     Pain Loc --      Pain Edu? --      Excl. in [place]? --    Vitals:   01/01/20 1450 01/01/20 1719  BP: (!) 100/52 (!) 142/69  Pulse: 61 67  Resp: 18 18  Temp: 98.2 F (36.8 C) 98.6 F (37 C)  SpO2: 97% 97%   Physical Exam Vitals and nursing note reviewed.  Constitutional:      Appearance: He is well-developed.  HENT:     Head: Normocephalic and atraumatic.     Right Ear: External ear normal.     Left Ear: External ear normal.     Nose: Nose normal.  Eyes:     Conjunctiva/sclera: Conjunctivae normal.  Cardiovascular:     Rate and Rhythm: Normal rate. Rhythm irregular.      Heart sounds: No murmur heard.   Pulmonary:     Effort: Pulmonary effort is normal. No respiratory distress.     Breath sounds: Normal breath sounds.  Abdominal:     Palpations: Abdomen is soft.     Tenderness: There is no abdominal tenderness.  Musculoskeletal:     Cervical back: Neck supple.     Right lower leg: No edema.     Left lower leg: No edema.  Skin:    General: Skin is warm and dry.  Neurological:     Mental Status: He is alert and oriented to person, place, and time.  Psychiatric:        Mood and Affect: Mood normal.     Cranial nerves II through XII grossly intact.  Patient has full and symmetric strength all extremities.  No tenderness step-offs or deformities over the C/T/L-spine.  No effusions deformities or tenderness over the bilateral shoulders, elbows, wrists, knees, or ankles.  2+ bilateral radial pulse. ____________________________________________   LABS (all labs ordered are listed, but only abnormal results are displayed)  Labs Reviewed  BASIC METABOLIC PANEL - Abnormal; Notable for the following components:      Result Value   Sodium 134 (*)    Potassium 3.3 (*)    Glucose, Bld 118 (*)    BUN 71 (*)    Creatinine, Ser 3.85 (*)    GFR, Estimated 14 (*)    All other components within normal limits  CBC - Abnormal; Notable for the following components:   RBC 3.68 (*)    Hemoglobin 11.2 (*)    HCT 33.4 (*)    All other components within normal limits  BRAIN NATRIURETIC PEPTIDE - Abnormal; Notable for the following components:   B Natriuretic Peptide 634.0 (*)    All other components within normal limits  HEPATIC FUNCTION PANEL - Abnormal; Notable for the  following components:   Total Protein 6.0 (*)    Indirect Bilirubin 1.0 (*)    All other components within normal limits  TROPONIN I (HIGH SENSITIVITY) - Abnormal; Notable for the following components:   Troponin I (High Sensitivity) 385 (*)    All other components within normal limits    RESPIRATORY PANEL BY RT PCR (FLU A&B, COVID)  MAGNESIUM  TSH  URINALYSIS, COMPLETE (UACMP) WITH MICROSCOPIC  APTT  PROTIME-INR  HEPARIN LEVEL (UNFRACTIONATED)  CBC  TROPONIN I (HIGH SENSITIVITY)   ____________________________________________  EKG  A. fib with a ventricular rate of 64, normal axis, unremarkable intervals, nonspecific ST changes in inferior lateral leads without other clear evidence of acute ischemia. ____________________________________________  RADIOLOGY  ED MD interpretation: Cardiomegaly and some bilateral patchiness that is nonspecific.  No clear focal consolidation, large effusion, or overt edema.  No pneumothorax.  No rib fracture.  CT head without evidence of chondral skull fracture or intracranial injury. Official radiology report(s): DG Chest 2 View  Result Date: 01/01/2020 CLINICAL DATA:  AFib. EXAM: CHEST - 2 VIEW COMPARISON:  11/01/2017. FINDINGS: Enlarged cardiac silhouette. Aortic atherosclerosis. Left basilar opacities. No visible pleural effusions or pneumothorax. No acute osseous abnormality. IMPRESSION: 1. Left basilar opacities, which may represent atelectasis, aspiration, and/or pneumonia. 2. Cardiomegaly. Electronically Signed   By: Margaretha Sheffield MD   On: 01/01/2020 15:26   CT Head Wo Contrast  Result Date: 01/01/2020 CLINICAL DATA:  Weakness EXAM: CT HEAD WITHOUT CONTRAST TECHNIQUE: Contiguous axial images were obtained from the base of the skull through the vertex without intravenous contrast. COMPARISON:  09/17/2017 FINDINGS: Brain: No evidence of acute infarction, hemorrhage, hydrocephalus, extra-axial collection or mass lesion/mass effect. Chronic atrophic and ischemic changes are noted. Vascular: No hyperdense vessel or unexpected calcification. Skull: Normal. Negative for fracture or focal lesion. Sinuses/Orbits: No acute finding. Other: Postsurgical changes are noted on the right consistent with cochlear implant. IMPRESSION: Chronic  atrophic and ischemic changes. Postsurgical changes without acute abnormality. Electronically Signed   By: Inez Catalina M.D.   On: 01/01/2020 18:59   US Renal  Result Date: 01/01/2020 CLINICAL DATA:  Acute renal injury EXAM: RENAL / URINARY TRACT ULTRASOUND COMPLETE COMPARISON:  None. FINDINGS: Right Kidney: Renal measurements: 10.7 x 4.2 x 5.3 cm. = volume: 123 mL. Cysts are noted within the right kidney. The largest of these measures 3.6 cm in greatest dimension in the midportion of the right kidney. Smaller cysts are noted as well. Overall less than 10 cysts are seen. No mass lesion or hydronephrosis is seen. Left Kidney: Renal measurements: 9.6 x 4.4 x 5.7 cm. = volume: 128 mL. 2.5 cm cyst is noted within the midportion of the left kidney. No mass lesion or hydronephrosis is noted. Bladder: Appears normal for degree of bladder distention. Other: None. IMPRESSION: Bilateral simple renal cysts.  No acute abnormality noted. Electronically Signed   By: Inez Catalina M.D.   On: 01/01/2020 18:57    ____________________________________________   PROCEDURES  Procedure(s) performed (including Critical Care):  .1-3 Lead EKG Interpretation Performed by: Lucrezia Starch, MD Authorized by: Lucrezia Starch, MD     Interpretation: abnormal     ECG rate assessment: normal     Rhythm: atrial fibrillation     Ectopy: none     Conduction: normal    .Critical Care Performed by: Lucrezia Starch, MD Authorized by: Lucrezia Starch, MD   Critical care provider statement:    Critical care time (minutes):  45  Critical care was necessary to treat or prevent imminent or life-threatening deterioration of the following conditions:  Cardiac failure   Critical care was time spent personally by me on the following activities:  Discussions with consultants, evaluation of patient's response to treatment, examination of patient, ordering and performing treatments and interventions, ordering and review of  laboratory studies, ordering and review of radiographic studies, pulse oximetry, re-evaluation of patient's condition, obtaining history from patient or surrogate and review of old charts     ____________________________________________   INITIAL IMPRESSION / [place] / ED COURSE        Patient presents with above-stated history exam for assessment after a syncopal episode witnessed today by some of his friends as described above as well new diagnosis of A. Fib.  On arrival patient is borderline hypotensive with a BP of 100/52 but on recheck is noted to have a BP of 142/69 with stable vital signs on room air.  Differential for patient's syncopal episode today includes but is not limited to ACS, symptomatic slower rapid A. fib or other arrhythmia, anemia, metabolic derangements, PE, CVA, and CHF.  Very low suspicion for PE as patient denies any chest pain or shortness of breath and is not tachycardic, tachypneic, hypoxic, and has no other clear risk factors on exam or history.  Patient does not appear volume overloaded on exam or chest x-ray.  Will restart his need to be large.  CBC shows some slight anemia with hemoglobin of 11.2 although this does not appear to be acute as patient's hemoglobin was 12.94 weeks ago and thirteen 1 year ago and have low suspicion that this is contributing to patient syncopal episode.  WBC count and platelets are normal.  BMP remarkable for evidence of an AKI with a BUN of 71 and a creatinine of 3.85 compared to that obtained 4 weeks ago with a BUN of 29 and a creatinine of 1.55.  Patient is also mildly hypokalemic with a K of 3.3.  I did perform a bedside ultrasound exam that showed patient had a distended bladder.  We will plan to insert a Foley as I am concerned he may have some postobstructive uropathy.  Is also possible that given his BNP is elevated and he has an elevated troponin that he had an ischemic event causing new heart failure, new onset A.  fib, and possible cardiorenal syndrome.  I did discuss these findings with on-call cardiologist Dr. Miki Kins who recommended giving patient aspirin, starting heparin, and obtaining an echo and stated he would see the patient in the morning.  I have very low suspicion for CVA as patient denies any focal deficits he did state he fell yesterday and broke his glasses and while he denies hitting his head and there is no obvious trauma on exam CT head was obtained to assess for any evidence of intracranial hemorrhage or other acute intracranial process.  CT head without evidence of acute intracranial injury.  Plan to admit to hospital service for further evaluation management.      ____________________________________________   FINAL CLINICAL IMPRESSION(S) / ED DIAGNOSES  Final diagnoses:  Atrial fibrillation, unspecified type (HCC)  AKI (acute kidney injury) ([place])  Hypokalemia  Syncope, unspecified syncope type  NSTEMI (non-ST elevated myocardial infarction) ([place])  Elevated brain natriuretic peptide (BNP) level    Medications  heparin ADULT infusion 100 units/mL (25000 units/251mL sodium chloride 0.45%) (900 Units/hr Intravenous New Bag/Given 01/01/20 1940)  potassium chloride SA (KLOR-CON) CR tablet 40 mEq (40 mEq  Oral Given 01/01/20 1938)  aspirin chewable tablet 324 mg (324 mg Oral Given 01/01/20 1939)  heparin bolus via infusion 4,000 Units (4,000 Units Intravenous Bolus from Bag 01/01/20 1940)     ED Discharge Orders    None       Note:  This document was prepared using Dragon voice recognition software and may include unintentional dictation errors.   Lucrezia Starch, MD 01/01/20 [phone]

## 2020-01-01 NOTE — ED Notes (Signed)
Pt presents to ED via EMS with c/o of "falling asleep while playing bridge". Pt states people then pulled the cords and the pt was then brought here. Pt is currently A&Ox4. Pt states to this RN EMS states he was a new onset afib. Pt denies chest pain or SOB. NAD noted.

## 2020-01-01 NOTE — Progress Notes (Signed)
[place] for heparin Indication: chest pain/ACS  Allergies  Allergen Reactions  . Atorvastatin     REACTION: raises blood pressure  . Pravastatin Sodium     REACTION: raises blood pressure  . Statins     REACTION: raises bp    Patient Measurements: Height: 5\' 6"  (167.6 cm) Weight: 78 kg (172 lb) IBW/kg (Calculated) : 63.8 Heparin Dosing Weight: 78 kg  Vital Signs: Temp: 98.6 F (37 C) (11/03 1719) Temp Source: Oral (11/03 1719) BP: 142/69 (11/03 1719) Pulse Rate: 67 (11/03 1719)  Labs: Recent Labs    01/01/20 1455 01/01/20 1804  HGB 11.2*  --   HCT 33.4*  --   PLT 167  --   CREATININE 3.85*  --   TROPONINIHS  --  385*    Estimated Creatinine Clearance: 11.5 mL/min (A) (by C-G formula based on SCr of 3.85 mg/dL (H)).   Medical History: Past Medical History:  Diagnosis Date  . Actinic keratosis   . BPH (benign prostatic hypertrophy)   . Diseases of lips   . Diverticulosis of colon 06/2003  . ED (erectile dysfunction)   . GERD (gastroesophageal reflux disease)   . Heart murmur    as child  . HLD (hyperlipidemia)   . HOH (hard of hearing)    Cochlear implant right, hearing aid left  . HTN (hypertension)   . Numbness and tingling in both hands    intermittent, trying braces   . Osteoarthritis of knee    severe, bilat  . Renal insufficiency   . Restless legs syndrome (RLS)   . Sleep disturbance, unspecified   . Spinal stenosis, lumbar region, without neurogenic claudication      Assessment: 86 year old male who presents following syncopal episode. States he was told by EMS that he has new afib. No anticoagulation listed PTA. Pharmacy consult for heparin for ACS/STEMI.   Goal of Therapy:  Heparin level 0.3-0.7 units/ml Monitor platelets by anticoagulation protocol: Yes   Plan:  Heparin 4000 unit bolus followed by heparin drip at 900 units/hr. HL tomorrow at 0400. CBC daily while on heparin drip.  Tawnya Crook, PharmD 01/01/2020,7:19 PM

## 2020-01-01 NOTE — ED Notes (Signed)
Pt able to urinate 16mL in urinal. PVR 42 mL

## 2020-01-01 NOTE — ED Notes (Signed)
Date and time results received: 01/01/20   Test: trop Critical Value: 385  Name of Provider Notified: smith  Orders Received? Or Actions Taken?:

## 2020-01-01 NOTE — ED Notes (Signed)
Pt at CT

## 2020-01-01 NOTE — ED Notes (Signed)
US at bedside

## 2020-01-01 NOTE — H&P (Signed)
History and Physical   LONN IM FGH:[id number] DOB: [birth year] DOA: 01/01/2020  Referring MD/NP/PA: Dr. Gardenia Phlegm  PCP: [name], MD   Patient coming from: Home  Chief Complaint: Palpitations  HPI: Stephen Garrett is a 86 y.o. male with medical history significant of hypertension, decreased hearing, TIAs, restless leg syndrome, hyperlipidemia, BPH, GERD, status post cochlear implant and spinal stenosis who was here with his daughter presenting with presyncopal episodes and palpitations.  He lives in assisted living facility where he was evaluated.  Patient found to have new onset A. fib.  He was brought in where he was seen in the ER and evaluated.  He appears to have some mild EKG changes but significant increase in his troponins.  Patient said he fell yesterday while sitting playing bridge.  He did not hit his head.  He did not feel any chest pain.  He still does not have any chest pain.  Today he repeated episode where he was noted to have passed out midsentence prior to coming in.  He is fully with it but has poor hearing despite cochlear implant.  Daughter is at bedside and patient is fully awake alert oriented.  Denies any other significant complaint.  He is being admitted with non-ST elevation MI and new onset atrial fibrillation.  Patient also has worsening renal function with creatinine more than 3.  ED Course: Temperature 98.6 blood pressure 153/127 Kolls 55-18 respiratory rate of 20 oxygen sat 97% on room air.  White count 8.9 hemoglobin 11.2 and platelet 167.  Sodium 134 potassium 3.2 chloride 98 CO2 23 BUN 78 creatinine 3.85 calcium 8.9.  INR 1.1 glucose 118.  Urinalysis so far negative.  COVID-19 screen also negative.  PT 13.2 INR 1.1.  BNP is 634.  Troponin III 85 and 336.  TSH 3.231.  Chest x-ray shows left basilar opacity which may represent atelectasis aspiration or pneumonia.  Renal ultrasound showed bilateral simple renal cell no acute abnormalities.  Head CT  without contrast showed chronic atrophic and ischemic changes.  Patient being admitted with acute kidney injury and non-ST elevation MI  Review of Systems: As per HPI otherwise 10 point review of systems negative.    Past Medical History:  Diagnosis Date  . Actinic keratosis   . BPH (benign prostatic hypertrophy)   . Diseases of lips   . Diverticulosis of colon 06/2003  . ED (erectile dysfunction)   . GERD (gastroesophageal reflux disease)   . Heart murmur    as child  . HLD (hyperlipidemia)   . HOH (hard of hearing)    Cochlear implant right, hearing aid left  . HTN (hypertension)   . Numbness and tingling in both hands    intermittent, trying braces   . Osteoarthritis of knee    severe, bilat  . Renal insufficiency   . Restless legs syndrome (RLS)   . Sleep disturbance, unspecified   . Spinal stenosis, lumbar region, without neurogenic claudication     Past Surgical History:  Procedure Laterality Date  . CATARACT EXTRACTION, BILATERAL  2011   Dr. Thomasene Ripple  . COCHLEAR IMPLANT Right 02/15/2017   Procedure: RIGHT COCHLEAR IMPLANT;  Surgeon: Vicie Mutters, MD;  Location: [facility];  Service: ENT;  Laterality: Right;  . FINGER SURGERY     Right 4th finger  . [facility]  10/2009   Dr. Bary Castilla  . LUMBAR SPINE SURGERY  06/2005   stenosis repair  . PTOSIS REPAIR Bilateral 04/19/2019  Procedure: BLEPHAROPTOSIS REPAIR; RESECT SKIN;  Surgeon: Karle Starch, MD;  Location: [facility];  Service: Ophthalmology;  Laterality: Bilateral;  . TONSILLECTOMY  childhood  . TOTAL KNEE ARTHROPLASTY  06/2008   bilat (Dr. Marry Guan)     reports that he quit smoking about 49 years ago. He has a 22.50 pack-year smoking history. He has never used smokeless tobacco. He reports current alcohol use. He reports that he does not use drugs.  Allergies  Allergen Reactions  . Atorvastatin     REACTION: raises blood pressure  . Pravastatin Sodium     REACTION: raises blood pressure  .  Statins     REACTION: raises bp    Family History  Problem Relation Age of Onset  . Other Father        "clogged arteries"  . Hypertension Mother      Prior to Admission medications   Medication Sig Start Date End Date Taking? Authorizing Provider  amLODipine (NORVASC) 5 MG tablet TAKE ONE TABLET BY MOUTH EVERY DAY 10/21/19   Viviana Simpler I, MD  aspirin EC 81 MG EC tablet Take 1 tablet (81 mg total) by mouth daily. 09/16/17   Fritzi Mandes, MD  buPROPion (WELLBUTRIN XL) 150 MG 24 hr tablet TAKE 1 TABLET BY MOUTH DAILY 03/18/19   [name], MD  clonazePAM (KLONOPIN) 0.5 MG tablet TAKE ONE TO TWO TABLETS AT BEDTIME 10/22/19   [name], MD  finasteride (PROSCAR) 5 MG tablet TAKE ONE TABLET BY MOUTH EVERY DAY 08/05/19   [name], MD  hydrochlorothiazide (HYDRODIURIL) 25 MG tablet TAKE ONE TABLET EVERY DAY 07/30/19   [name], MD  losartan (COZAAR) 100 MG tablet TAKE ONE TABLET EVERY DAY 08/12/19   [name], MD  Multiple Vitamin (MULTIVITAMIN) capsule Take 1 capsule by mouth daily.      [provider]  omeprazole (PRILOSEC) 40 MG capsule TAKE 1 CAPSULE BY MOUTH ONCE DAILY 04/25/19   Viviana Simpler I, MD  polyethylene glycol (MIRALAX / GLYCOLAX) packet Take 17 g by mouth daily.    [provider]  pramipexole (MIRAPEX) 1 MG tablet TAKE ONE TABLET AT DINNER AND ONE TABLETAT BEDTIME 09/16/19   [name], MD  pyridoxine (B-6) 100 MG tablet Take 100 mg by mouth daily.    [provider]  tamsulosin (FLOMAX) 0.4 MG CAPS capsule TAKE 1 CAPSULE EVERY DAY 08/05/19   [name], MD  citalopram (CELEXA) 10 MG tablet Take 1 tablet (10 mg total) by mouth daily. 04/22/11 05/18/11  [name], MD    Physical Exam: Vitals:   01/01/20 2000 01/01/20 2130 01/01/20 2145 01/01/20 2212  BP: 122/68 111/73  111/73  Pulse: 72 65 65 (!) 55  Resp: 19 17 18    Temp:      TempSrc:      SpO2: 98% 98% 98%   Weight:      Height:            Constitutional: Hard of hearing, stable no distress Vitals:   01/01/20 2000 01/01/20 2130 01/01/20 2145 01/01/20 2212  BP: 122/68 111/73  111/73  Pulse: 72 65 65 (!) 55  Resp: 19 17 18    Temp:      TempSrc:      SpO2: 98% 98% 98%   Weight:      Height:       Eyes: PERRL, lids and conjunctivae normal ENMT: Mucous membranes are moist. Posterior pharynx clear of  any exudate or lesions.Normal dentition.  Decreased hearing Neck: normal, supple, no masses, no thyromegaly Respiratory: clear to auscultation bilaterally, no wheezing, no crackles. Normal respiratory effort. No accessory muscle use.  Cardiovascular: Irregularly irregular rate and rhythm, no murmurs / rubs / gallops. No extremity edema. 2+ pedal pulses. No carotid bruits.  Abdomen: no tenderness, no masses palpated. No hepatosplenomegaly. Bowel sounds positive.  Musculoskeletal: no clubbing / cyanosis. No joint deformity upper and lower extremities. Good ROM, no contractures. Normal muscle tone.  Skin: no rashes, lesions, ulcers. No induration Neurologic: CN 2-12 grossly intact. Sensation intact, DTR normal. Strength 5/5 in all 4.  Psychiatric: Normal judgment and insight. Alert and oriented x 3. Normal mood.     Labs on Admission: I have personally reviewed following labs and imaging studies  CBC: Recent Labs  Lab 01/01/20 1455  WBC 8.9  HGB 11.2*  HCT 33.4*  MCV 90.8  PLT 161   Basic Metabolic Panel: Recent Labs  Lab 01/01/20 1455 01/01/20 1804  NA 134*  --   K 3.3*  --   CL 98  --   CO2 23  --   GLUCOSE 118*  --   BUN 71*  --   CREATININE 3.85*  --   CALCIUM 8.9  --   MG  --  2.1   GFR: Estimated Creatinine Clearance: 11.5 mL/min (A) (by C-G formula based on SCr of 3.85 mg/dL (H)). Liver Function Tests: Recent Labs  Lab 01/01/20 1804  AST 29  ALT 21  ALKPHOS 56  BILITOT 1.2  PROT 6.0*  ALBUMIN 3.5   No results for input(s): LIPASE, AMYLASE in the last 168 hours. No results for  input(s): AMMONIA in the last 168 hours. Coagulation Profile: Recent Labs  Lab 01/01/20 2030  INR 1.1   Cardiac Enzymes: No results for input(s): CKTOTAL, CKMB, CKMBINDEX, TROPONINI in the last 168 hours. BNP (last 3 results) No results for input(s): PROBNP in the last 8760 hours. HbA1C: No results for input(s): HGBA1C in the last 72 hours. CBG: No results for input(s): GLUCAP in the last 168 hours. Lipid Profile: No results for input(s): CHOL, HDL, LDLCALC, TRIG, CHOLHDL, LDLDIRECT in the last 72 hours. Thyroid Function Tests: Recent Labs    01/01/20 1804  TSH 3.231   Anemia Panel: No results for input(s): VITAMINB12, FOLATE, FERRITIN, TIBC, IRON, RETICCTPCT in the last 72 hours. Urine analysis:    Component Value Date/Time   COLORURINE AMBER (A) 01/01/2020 1907   APPEARANCEUR CLOUDY (A) 01/01/2020 1907   LABSPEC 1.017 01/01/2020 1907   PHURINE 5.0 01/01/2020 1907   GLUCOSEU NEGATIVE 01/01/2020 1907   HGBUR NEGATIVE 01/01/2020 1907   HGBUR large 09/03/2008 0933   BILIRUBINUR NEGATIVE 01/01/2020 1907   BILIRUBINUR Negative 08/04/2017 1446   KETONESUR NEGATIVE 01/01/2020 1907   PROTEINUR NEGATIVE 01/01/2020 1907   UROBILINOGEN 0.2 08/04/2017 1446   UROBILINOGEN 0.2 09/03/2008 0933   NITRITE NEGATIVE 01/01/2020 1907   LEUKOCYTESUR NEGATIVE 01/01/2020 1907   Sepsis Labs: @LABRCNTIP (procalcitonin:4,lacticidven:4) ) Recent Results (from the past 240 hour(s))  Respiratory Panel by RT PCR (Flu A&B, Covid) - Nasopharyngeal Swab     Status: None   Collection Time: 01/01/20  7:07 PM   Specimen: Nasopharyngeal Swab  Result Value Ref Range Status   SARS Coronavirus 2 by RT PCR NEGATIVE NEGATIVE Final    Comment: (NOTE) SARS-CoV-2 target nucleic acids are NOT DETECTED.  The SARS-CoV-2 RNA is generally detectable in upper respiratoy specimens during the acute phase of infection. The  lowest concentration of SARS-CoV-2 viral copies this assay can detect is 131 copies/mL. A  negative result does not preclude SARS-Cov-2 infection and should not be used as the sole basis for treatment or other patient management decisions. A negative result may occur with  improper specimen collection/handling, submission of specimen other than nasopharyngeal swab, presence of viral mutation(s) within the areas targeted by this assay, and inadequate number of viral copies (<131 copies/mL). A negative result must be combined with clinical observations, patient history, and epidemiological information. The expected result is Negative.  Fact Sheet for Patients:  PinkCheek.be  Fact Sheet for Healthcare Providers:  GravelBags.it  This test is no t yet approved or cleared by the [place] FDA and  has been authorized for detection and/or diagnosis of SARS-CoV-2 by FDA under an Emergency Use Authorization (EUA). This EUA will remain  in effect (meaning this test can be used) for the duration of the COVID-19 declaration under Section 564(b)(1) of the Act, 21 U.S.C. section 360bbb-3(b)(1), unless the authorization is terminated or revoked sooner.     Influenza A by PCR NEGATIVE NEGATIVE Final   Influenza B by PCR NEGATIVE NEGATIVE Final    Comment: (NOTE) The Xpert Xpress SARS-CoV-2/FLU/RSV assay is intended as an aid in  the diagnosis of influenza from Nasopharyngeal swab specimens and  should not be used as a sole basis for treatment. Nasal washings and  aspirates are unacceptable for Xpert Xpress SARS-CoV-2/FLU/RSV  testing.  Fact Sheet for Patients: PinkCheek.be  Fact Sheet for Healthcare Providers: GravelBags.it  This test is not yet approved or cleared by the [place] FDA and  has been authorized for detection and/or diagnosis of SARS-CoV-2 by  FDA under an Emergency Use Authorization (EUA). This EUA will remain  in effect (meaning this test can  be used) for the duration of the  Covid-19 declaration under Section 564(b)(1) of the Act, 21  U.S.C. section 360bbb-3(b)(1), unless the authorization is  terminated or revoked. Performed at [facility], [address], [place], [place] [address]      Radiological Exams on Admission: DG Chest 2 View  Result Date: 01/01/2020 CLINICAL DATA:  AFib. EXAM: CHEST - 2 VIEW COMPARISON:  11/01/2017. FINDINGS: Enlarged cardiac silhouette. Aortic atherosclerosis. Left basilar opacities. No visible pleural effusions or pneumothorax. No acute osseous abnormality. IMPRESSION: 1. Left basilar opacities, which may represent atelectasis, aspiration, and/or pneumonia. 2. Cardiomegaly. Electronically Signed   By: Margaretha Sheffield MD   On: 01/01/2020 15:26   CT Head Wo Contrast  Result Date: 01/01/2020 CLINICAL DATA:  Weakness EXAM: CT HEAD WITHOUT CONTRAST TECHNIQUE: Contiguous axial images were obtained from the base of the skull through the vertex without intravenous contrast. COMPARISON:  09/17/2017 FINDINGS: Brain: No evidence of acute infarction, hemorrhage, hydrocephalus, extra-axial collection or mass lesion/mass effect. Chronic atrophic and ischemic changes are noted. Vascular: No hyperdense vessel or unexpected calcification. Skull: Normal. Negative for fracture or focal lesion. Sinuses/Orbits: No acute finding. Other: Postsurgical changes are noted on the right consistent with cochlear implant. IMPRESSION: Chronic atrophic and ischemic changes. Postsurgical changes without acute abnormality. Electronically Signed   By: Inez Catalina M.D.   On: 01/01/2020 18:59   US Renal  Result Date: 01/01/2020 CLINICAL DATA:  Acute renal injury EXAM: RENAL / URINARY TRACT ULTRASOUND COMPLETE COMPARISON:  None. FINDINGS: Right Kidney: Renal measurements: 10.7 x 4.2 x 5.3 cm. = volume: 123 mL. Cysts are noted within the right kidney. The largest of these measures 3.6 cm in greatest dimension  in the midportion  of the right kidney. Smaller cysts are noted as well. Overall less than 10 cysts are seen. No mass lesion or hydronephrosis is seen. Left Kidney: Renal measurements: 9.6 x 4.4 x 5.7 cm. = volume: 128 mL. 2.5 cm cyst is noted within the midportion of the left kidney. No mass lesion or hydronephrosis is noted. Bladder: Appears normal for degree of bladder distention. Other: None. IMPRESSION: Bilateral simple renal cysts.  No acute abnormality noted. Electronically Signed   By: Inez Catalina M.D.   On: 01/01/2020 18:57    EKG: Independently reviewed.  Shows atrial fibrillation with a rate of 64, ST depression in the lateral leads.  Assessment/Plan Principal Problem:   NSTEMI (non-ST elevated myocardial infarction) ([place]) Active Problems:   Essential hypertension, benign   GERD   Chronic venous insufficiency   Acute renal failure superimposed on stage 3 chronic kidney disease (HCC)   Hypokalemia   A-fib (HCC)     #1  Non-ST elevation MI: Patient will be admitted to a monitored bed.  Serial enzymes.  Start IV heparin.  Beta-blockers.  Dr. Quentin Ore cardiologist consulted.  Echocardiogram in the morning and follow cardiology recommendations.  #2 new onset A. fib: Rate is controlled.  Started heparin.  Continue rate control with beta-blockers.  Follow echocardiogram  #3 acute on chronic kidney disease stage III: Probably dehydration.  Hydrate and follow renal function.  #4 hypokalemia: Replete potassium and monitor  #5 essential hypertension: Continue blood pressure medications.  #6 GERD: Continue with PPIs   DVT prophylaxis: Heparin drip Code Status: Full code Family Communication: Daughter at bedside Disposition Plan: To be determined Consults called: Dr. Quentin Ore cardiologist Admission status: Inpatient  Severity of Illness: The appropriate patient status for this patient is INPATIENT. Inpatient status is judged to be reasonable and necessary in order to provide the required intensity  of service to ensure the patient's safety. The patient's presenting symptoms, physical exam findings, and initial radiographic and laboratory data in the context of their chronic comorbidities is felt to place them at high risk for further clinical deterioration. Furthermore, it is not anticipated that the patient will be medically stable for discharge from the hospital within 2 midnights of admission. The following factors support the patient status of inpatient.   " The patient's presenting symptoms include palpitations. " The worrisome physical exam findings include decreased hearing. " The initial radiographic and laboratory data are worrisome because of increased troponins and EKG changes. " The chronic co-morbidities include hypertension and poor hearing.   * I certify that at the point of admission it is my clinical judgment that the patient will require inpatient hospital care spanning beyond 2 midnights from the point of admission due to high intensity of service, high risk for further deterioration and high frequency of surveillance required.Barbette Merino MD [facility] Pager [phone]  If 7PM-7AM, please contact night-coverage [email or web address] Password [facility]  01/01/2020, 10:19 PM

## 2020-01-01 NOTE — ED Triage Notes (Signed)
Pt to ED via EMS for chief complaint of new onset afib per EMS. Pt denies SHOB, CP, weakness, dizziness.   Reports taking aspirin daily   Hx of HTN, states has taken BP meds already today   Pt in NAD, alert and oriented, clear speech

## 2020-01-02 DIAGNOSIS — N179 Acute kidney failure, unspecified: Secondary | ICD-10-CM | POA: Diagnosis not present

## 2020-01-02 DIAGNOSIS — R55 Syncope and collapse: Secondary | ICD-10-CM | POA: Diagnosis not present

## 2020-01-02 DIAGNOSIS — I4891 Unspecified atrial fibrillation: Secondary | ICD-10-CM

## 2020-01-02 DIAGNOSIS — D649 Anemia, unspecified: Secondary | ICD-10-CM

## 2020-01-02 DIAGNOSIS — E876 Hypokalemia: Secondary | ICD-10-CM

## 2020-01-02 DIAGNOSIS — N1832 Chronic kidney disease, stage 3b: Secondary | ICD-10-CM

## 2020-01-02 DIAGNOSIS — I10 Essential (primary) hypertension: Secondary | ICD-10-CM | POA: Diagnosis not present

## 2020-01-02 LAB — HEPARIN LEVEL (UNFRACTIONATED)
Heparin Unfractionated: 0.21 IU/mL — ABNORMAL LOW (ref 0.30–0.70)
Heparin Unfractionated: 0.17 IU/mL — ABNORMAL LOW (ref 0.30–0.70)
Heparin Unfractionated: 0.62 IU/mL (ref 0.30–0.70)

## 2020-01-02 LAB — BASIC METABOLIC PANEL
Anion gap: 11 (ref 5–15)
BUN: 67 mg/dL — ABNORMAL HIGH (ref 8–23)
CO2: 23 mmol/L (ref 22–32)
Calcium: 8.5 mg/dL — ABNORMAL LOW (ref 8.9–10.3)
Chloride: 102 mmol/L (ref 98–111)
Creatinine, Ser: 3.03 mg/dL — ABNORMAL HIGH (ref 0.61–1.24)
GFR, Estimated: 18 mL/min — ABNORMAL LOW (ref >60)
Glucose, Bld: 130 mg/dL — ABNORMAL HIGH (ref 70–99)
Potassium: 3.2 mmol/L — ABNORMAL LOW (ref 3.5–5.1)
Sodium: 136 mmol/L (ref 135–145)

## 2020-01-02 LAB — LIPID PANEL
Cholesterol: 199 mg/dL (ref 0–200)
HDL: 37 mg/dL — ABNORMAL LOW (ref >40)
LDL Cholesterol: 148 mg/dL — ABNORMAL HIGH (ref 0–99)
Total CHOL/HDL Ratio: 5.4 RATIO
Triglycerides: 70 mg/dL (ref <150)
VLDL: 14 mg/dL (ref 0–40)

## 2020-01-02 LAB — CBC
HCT: 34.7 % — ABNORMAL LOW (ref 39.0–52.0)
Hemoglobin: 11.7 g/dL — ABNORMAL LOW (ref 13.0–17.0)
MCH: 30.5 pg (ref 26.0–34.0)
MCHC: 33.7 g/dL (ref 30.0–36.0)
MCV: 90.4 fL (ref 80.0–100.0)
Platelets: 166 10*3/uL (ref 150–400)
RBC: 3.84 MIL/uL — ABNORMAL LOW (ref 4.22–5.81)
RDW: 14.3 % (ref 11.5–15.5)
WBC: 7.4 10*3/uL (ref 4.0–10.5)
nRBC: 0 % (ref 0.0–0.2)

## 2020-01-02 LAB — ECHOCARDIOGRAM COMPLETE
AR max vel: 1.66 cm2
AV Area VTI: 2 cm2
AV Area mean vel: 1.83 cm2
AV Mean grad: 9.6 mmHg
AV Peak grad: 16.7 mmHg
Ao pk vel: 2.04 m/s
S' Lateral: 2.65 cm

## 2020-01-02 LAB — TROPONIN I (HIGH SENSITIVITY): Troponin I (High Sensitivity): 333 ng/L (ref <18)

## 2020-01-02 MED ORDER — AMLODIPINE BESYLATE 5 MG PO TABS
5.0000 mg | ORAL_TABLET | Freq: Every day | ORAL | Status: DC
  Administered 2020-01-02 – 2020-01-03 (×2): 5 mg via ORAL
  Filled 2020-01-02 (×2): qty 1

## 2020-01-02 MED ORDER — METOPROLOL TARTRATE 25 MG PO TABS: 12.5000 mg | ORAL_TABLET | Freq: Two times a day (BID) | ORAL | Status: DC

## 2020-01-02 MED ORDER — TAMSULOSIN HCL 0.4 MG PO CAPS
0.4000 mg | ORAL_CAPSULE | Freq: Every day | ORAL | Status: DC
  Administered 2020-01-02 – 2020-01-05 (×4): 0.4 mg via ORAL
  Filled 2020-01-02 (×4): qty 1

## 2020-01-02 MED ORDER — FINASTERIDE 5 MG PO TABS
5.0000 mg | ORAL_TABLET | Freq: Every day | ORAL | Status: DC
  Administered 2020-01-02 – 2020-01-05 (×4): 5 mg via ORAL
  Filled 2020-01-02 (×4): qty 1

## 2020-01-02 MED ORDER — POTASSIUM CHLORIDE CRYS ER 20 MEQ PO TBCR
40.0000 meq | EXTENDED_RELEASE_TABLET | Freq: Once | ORAL | Status: AC
  Administered 2020-01-02: 40 meq via ORAL
  Filled 2020-01-02: qty 2

## 2020-01-02 MED ORDER — HEPARIN BOLUS VIA INFUSION
1150.0000 [IU] | Freq: Once | INTRAVENOUS | Status: AC
  Administered 2020-01-02: 1150 [IU] via INTRAVENOUS
  Filled 2020-01-02: qty 1150

## 2020-01-02 MED ORDER — BUPROPION HCL ER (XL) 150 MG PO TB24
150.0000 mg | ORAL_TABLET | Freq: Every day | ORAL | Status: DC
  Administered 2020-01-02 – 2020-01-05 (×4): 150 mg via ORAL
  Filled 2020-01-02 (×4): qty 1

## 2020-01-02 MED ORDER — SODIUM CHLORIDE 0.9 % IV SOLN: INTRAVENOUS | Status: DC

## 2020-01-02 MED ORDER — PRAMIPEXOLE DIHYDROCHLORIDE 1 MG PO TABS
1.0000 mg | ORAL_TABLET | Freq: Two times a day (BID) | ORAL | Status: DC
  Administered 2020-01-02 – 2020-01-04 (×6): 1 mg via ORAL
  Filled 2020-01-02 (×8): qty 1

## 2020-01-02 MED ORDER — PANTOPRAZOLE SODIUM 40 MG PO TBEC
80.0000 mg | DELAYED_RELEASE_TABLET | Freq: Every day | ORAL | Status: DC
  Administered 2020-01-02 – 2020-01-05 (×4): 80 mg via ORAL
  Filled 2020-01-02 (×4): qty 2

## 2020-01-02 MED ORDER — HEPARIN BOLUS VIA INFUSION
2300.0000 [IU] | Freq: Once | INTRAVENOUS | Status: AC
  Administered 2020-01-02: 2300 [IU] via INTRAVENOUS
  Filled 2020-01-02: qty 2300

## 2020-01-02 MED ORDER — CLONAZEPAM 0.5 MG PO TABS
0.5000 mg | ORAL_TABLET | Freq: Every day | ORAL | Status: DC
  Administered 2020-01-02 – 2020-01-04 (×3): 0.5 mg via ORAL
  Filled 2020-01-02 (×3): qty 1

## 2020-01-02 NOTE — Progress Notes (Signed)
[place] for heparin Indication: chest pain/ACS and atrial fibrillation  Allergies  Allergen Reactions  . Atorvastatin     REACTION: raises blood pressure  . Pravastatin Sodium     REACTION: raises blood pressure  . Statins     REACTION: raises bp    Patient Measurements: Height: 5\' 6"  (167.6 cm) Weight: 81.1 kg (178 lb 14.4 oz) IBW/kg (Calculated) : 63.8 Heparin Dosing Weight: 78 kg  Vital Signs: Temp: 97.6 F (36.4 C) (11/04 1928) Temp Source: Oral (11/04 1928) BP: 122/77 (11/04 1928) Pulse Rate: 53 (11/04 1928)  Labs: Recent Labs    01/01/20 1455 01/01/20 1804 01/01/20 1936 01/01/20 2030 01/01/20 2252 01/02/20 0405 01/02/20 0637 01/02/20 0753 01/02/20 1357 01/02/20 2300  HGB 11.2*  --   --   --   --   --  11.7*  --   --   --   HCT 33.4*  --   --   --   --   --  34.7*  --   --   --   PLT 167  --   --   --   --   --  166  --   --   --   APTT  --   --   --  >160*  --   --   --   --   --   --   LABPROT  --   --   --  13.7  --   --   --   --   --   --   INR  --   --   --  1.1  --   --   --   --   --   --   HEPARINUNFRC  --   --   --   --   --  0.21*  --   --  0.17* 0.62  CREATININE 3.85*  --   --   --   --   --   --  3.03*  --   --   TROPONINIHS  --  385* 336*  --  333*  --   --   --   --   --     Estimated Creatinine Clearance: 14.9 mL/min (A) (by C-G formula based on SCr of 3.03 mg/dL (H)).   Medical History: Past Medical History:  Diagnosis Date  . Actinic keratosis   . BPH (benign prostatic hypertrophy)   . Diseases of lips   . Diverticulosis of colon 06/2003  . ED (erectile dysfunction)   . GERD (gastroesophageal reflux disease)   . Heart murmur    as child  . HLD (hyperlipidemia)   . HOH (hard of hearing)    Cochlear implant right, hearing aid left  . HTN (hypertension)   . Numbness and tingling in both hands    intermittent, trying braces   . Osteoarthritis of knee    severe, bilat  . Renal  insufficiency   . Restless legs syndrome (RLS)   . Sleep disturbance, unspecified   . Spinal stenosis, lumbar region, without neurogenic claudication      Assessment: 86 year old male who presents following syncopal episode. States he was told by EMS that he has new afib. No anticoagulation listed PTA. Pharmacy consult for heparin for ACS/STEMI.   11/4 0405 HL 0.21  11/4 1357 HL 0.17  11/4 2300 HL 0.62  Goal of Therapy:  Heparin level 0.3-0.7 units/ml Monitor platelets by  anticoagulation protocol: Yes   Plan:  11/4:  HL @ 2300 = 0.62,  Therapeutic X 1 .  Recheck heparin level in 8 hours. CBC daily while on heparin.   Kahlia Lagunes D, PharmD 01/02/2020,11:46 PM

## 2020-01-02 NOTE — Hospital Course (Signed)
86 y.o. male with medical history significant of hypertension, decreased hearing, TIAs, restless leg syndrome, hyperlipidemia, BPH, GERD, status post cochlear implant and spinal stenosis who was here with his daughter presenting with presyncopal episodes and palpitations.    He lives in assisted living facility where he was evaluated.  Patient found to have new onset A. Fib fell yesterday while sitting playing bridge.  . repeated episode where he was noted to have passed out midsentence prior to coming in. worsening renal function with creatinine more than 3. Troponin 333, stable On heparin

## 2020-01-02 NOTE — Progress Notes (Addendum)
Mobility Specialist - Progress Note   01/02/20 1356  Mobility  Activity Ambulated in room  Level of Assistance Standby assist, set-up cues, supervision of patient - no hands on (CGA for safety)  Assistive Device Front wheel walker  Distance Ambulated (ft) 20 ft  Mobility Response Tolerated well  Mobility performed by Mobility specialist  $Mobility charge 1 Mobility    Pre-mobility: 53 HR, 95% SpO2 Post-mobility: 56 HR, 97% SpO2  Pt laying in bed upon arrival. Pt bradycardia, resting HR is 53 bpm. Nurse aware. Pt very HOH. Pt agreed to session. Pt mod. Independent getting to EOB. Pt S2S CGA for boost. Pt ambulated 20' total in room using a RW w/ SBA. CGA utilized for safety. No LOB noted. Pt heavy breathing after ambulation, but denies SOB. Unable to get O2 reading d/t poor pleth. Overall, pt tolerated session well. Pt left laying in bed w/ HOB elevated. Bed alarm set and all needs placed in reach.    Makayle Krahn Mobility Specialist  01/02/20, 2:00 PM

## 2020-01-02 NOTE — Progress Notes (Addendum)
Stephen Garrett for heparin Indication: chest pain/ACS and atrial fibrillation  Allergies  Allergen Reactions  . Atorvastatin     REACTION: raises blood pressure  . Pravastatin Sodium     REACTION: raises blood pressure  . Statins     REACTION: raises bp    Patient Measurements: Height: 5\' 6"  (167.6 cm) Weight: 81.1 kg (178 lb 14.4 oz) IBW/kg (Calculated) : 63.8 Heparin Dosing Weight: 78 kg  Vital Signs: Temp: 97.9 F (36.6 C) (11/04 1100) Temp Source: Oral (11/04 1100) BP: 120/75 (11/04 1100) Pulse Rate: 62 (11/04 1100)  Labs: Recent Labs    01/01/20 1455 01/01/20 1804 01/01/20 1936 01/01/20 2030 01/01/20 2252 01/02/20 0405 01/02/20 0637 01/02/20 0753 01/02/20 1357  HGB 11.2*  --   --   --   --   --  11.7*  --   --   HCT 33.4*  --   --   --   --   --  34.7*  --   --   PLT 167  --   --   --   --   --  166  --   --   APTT  --   --   --  >160*  --   --   --   --   --   LABPROT  --   --   --  13.7  --   --   --   --   --   INR  --   --   --  1.1  --   --   --   --   --   HEPARINUNFRC  --   --   --   --   --  0.21*  --   --  0.17*  CREATININE 3.85*  --   --   --   --   --   --  3.03*  --   TROPONINIHS  --  385* 336*  --  333*  --   --   --   --     Estimated Creatinine Clearance: 14.9 mL/min (A) (by C-G formula based on SCr of 3.03 mg/dL (H)).   Medical History: Past Medical History:  Diagnosis Date  . Actinic keratosis   . BPH (benign prostatic hypertrophy)   . Diseases of lips   . Diverticulosis of colon 06/2003  . ED (erectile dysfunction)   . GERD (gastroesophageal reflux disease)   . Heart murmur    as child  . HLD (hyperlipidemia)   . HOH (hard of hearing)    Cochlear implant right, hearing aid left  . HTN (hypertension)   . Numbness and tingling in both hands    intermittent, trying braces   . Osteoarthritis of knee    severe, bilat  . Renal insufficiency   . Restless legs syndrome (RLS)   . Sleep disturbance,  unspecified   . Spinal stenosis, lumbar region, without neurogenic claudication      Assessment: 86 year old male who presents following syncopal episode. States he was told by EMS that he has new afib. No anticoagulation listed PTA. Pharmacy consult for heparin for ACS/STEMI.   11/4 0405 HL 0.21  11/4 1357 HL 0.17   Goal of Therapy:  Heparin level 0.3-0.7 units/ml Monitor platelets by anticoagulation protocol: Yes   Plan:  Heparin level is subtherapeutic.  Will give heparin bolus on 2300 units followed by increasing heparin infusion to 1250 units/hr. Recheck heparin level in  8 hours. CBC daily while on heparin.   Stephen Garrett, PharmD, BCPS 01/02/2020,2:47 PM

## 2020-01-02 NOTE — Plan of Care (Signed)
  Problem: Education: Goal: Ability to demonstrate management of disease process will improve Outcome: Not Progressing Goal: Ability to verbalize understanding of medication therapies will improve Outcome: Not Progressing Goal: Individualized Educational Video(s) Outcome: Not Progressing   Problem: Activity: Goal: Capacity to carry out activities will improve Outcome: Not Progressing   Problem: Cardiac: Goal: Ability to achieve and maintain adequate cardiopulmonary perfusion will improve Outcome: Not Progressing   Problem: Education: Goal: Knowledge of General Education information will improve Description: Including pain rating scale, medication(s)/side effects and non-pharmacologic comfort measures Outcome: Not Progressing   Problem: Health Behavior/Discharge Planning: Goal: Ability to manage health-related needs will improve Outcome: Not Progressing   Problem: Clinical Measurements: Goal: Ability to maintain clinical measurements within normal limits will improve Outcome: Not Progressing Goal: Will remain free from infection Outcome: Not Progressing Goal: Diagnostic test results will improve Outcome: Not Progressing Goal: Respiratory complications will improve Outcome: Not Progressing Goal: Cardiovascular complication will be avoided Outcome: Not Progressing   Problem: Activity: Goal: Risk for activity intolerance will decrease Outcome: Not Progressing   Problem: Nutrition: Goal: Adequate nutrition will be maintained Outcome: Not Progressing   Problem: Coping: Goal: Level of anxiety will decrease Outcome: Not Progressing   Problem: Elimination: Goal: Will not experience complications related to bowel motility Outcome: Not Progressing Goal: Will not experience complications related to urinary retention Outcome: Not Progressing   Problem: Pain Managment: Goal: General experience of comfort will improve Outcome: Not Progressing   Problem: Safety: Goal:  Ability to remain free from injury will improve Outcome: Not Progressing   Problem: Skin Integrity: Goal: Risk for impaired skin integrity will decrease Outcome: Not Progressing   

## 2020-01-02 NOTE — Consult Note (Signed)
Cardiology Consultation:   Patient ID: Stephen Garrett MRN: [id number]; DOB: 06/09/1923  Admit date: 01/01/2020 Date of Consult: 01/02/2020  Primary Care Provider: [name], MD [facility] Cardiologist: Kathlyn [place], MD  [place] Electrophysiologist:  None    Patient Profile:   Stephen Garrett is a 86 y.o. male with a hx of chest pain, hypertension, HLD, BPH, GERD remote history of tobacco use, decreased hearing s/p cochlear implant, spinal stenosis, reported TIAs, who is being seen today for the evaluation of elevated troponin and new onset atrial fibrillation at the request of Dr. Maylene Roes.  History of Present Illness:   Stephen Garrett is a 86 year old male with PMH as above.  He reportedly has family history of CAD but no family history of premature CAD.  He is not a diabetic.  He has remote history of tobacco use.  He drinks 1 glass of wine per week.  He is normally pretty active, as he plays tennis once or twice a week.  He actually used to be #2 in the [place] at tennis.  He was previously seen by Dr. Fletcher Anon at [facility] cardiology 04/2015 and then lost to follow-up.  At that time, he reported an episode of chest pain that was right-sided, sharp, and in the right upper quadrant lasting 5 to 10 minutes and followed by another 5 to 10 minutes of symptoms of substernal chest pain/aching.  Echo showed normal EF and only mild MR.  He was lost to follow-up.  He lives at an assisted living facility, [facility].  He reports that he has been feeling very fatigued for about a week.  No chest pain, tachypalpitations, presyncope.  He reports his main symptom is fatigue.  On 11/2, he was playing bridge and fell asleep and then fell out of his chair but did not lose consciousness.  He did not hit did not hit his head.  On 11/3, he continued to feel fatigued and fell asleep again.  He was evaluated at [facility] and sent to [facility] ED due to suspicion for atrial fibrillation.   On 01/01/2020, he  presented to the [facility] emergency department with his daughter with report of presyncopal episodes and fatigue.  Chest x-ray showed left basilar opacities, which could represent atelectasis, aspiration, or pneumonia.  Also noted was cardiomegaly.  Ultrasound was performed and noted bilateral kidney cysts and bladder distention.  CT head without acute findings.  Labs at presentation were significant for hyponatremia, hypokalemia, AKI, and anemia.  NA 134, K3.3, CR 3.85, BUN 71, hemoglobin 11.2, hematocrit 33.4, high-sensitivity troponin peak 385, BNP 08/03/1930, TSH 3.231. EKG showed Afib.   Since admission, echo has been performed with the EF 55 to 60%, NR WMA, moderate to severe LVH, RVSP 40.0 mmHg, severe LAE, mild MR, calcified aortic valve with stenosis, RAP 8 mmHg.  At time of cardiology consultation, he mainly notes fatigue.  He wants to go home.  Past Medical History:  Diagnosis Date  . Actinic keratosis   . BPH (benign prostatic hypertrophy)   . Diseases of lips   . Diverticulosis of colon 06/2003  . ED (erectile dysfunction)   . GERD (gastroesophageal reflux disease)   . Heart murmur    as child  . HLD (hyperlipidemia)   . HOH (hard of hearing)    Cochlear implant right, hearing aid left  . HTN (hypertension)   . Numbness and tingling in both hands    intermittent, trying braces   . Osteoarthritis of knee  severe, bilat  . Renal insufficiency   . Restless legs syndrome (RLS)   . Sleep disturbance, unspecified   . Spinal stenosis, lumbar region, without neurogenic claudication     Past Surgical History:  Procedure Laterality Date  . CATARACT EXTRACTION, BILATERAL  2011   Dr. Thomasene Ripple  . COCHLEAR IMPLANT Right 02/15/2017   Procedure: RIGHT COCHLEAR IMPLANT;  Surgeon: Vicie Mutters, MD;  Location: [place];  Service: ENT;  Laterality: Right;  . FINGER SURGERY     Right 4th finger  . [facility]  10/2009   Dr. Bary Castilla  . LUMBAR SPINE SURGERY  06/2005   stenosis repair    . PTOSIS REPAIR Bilateral 04/19/2019   Procedure: BLEPHAROPTOSIS REPAIR; RESECT SKIN;  Surgeon: Karle Starch, MD;  Location: [place];  Service: Ophthalmology;  Laterality: Bilateral;  . TONSILLECTOMY  childhood  . TOTAL KNEE ARTHROPLASTY  06/2008   bilat (Dr. Marry Guan)     Home Medications:  Prior to Admission medications   Medication Sig Start Date End Date Taking? Authorizing Provider  amLODipine (NORVASC) 5 MG tablet TAKE ONE TABLET BY MOUTH EVERY DAY Patient taking differently: Take 5 mg by mouth daily.  10/21/19  Yes Viviana Simpler I, MD  aspirin EC 81 MG EC tablet Take 1 tablet (81 mg total) by mouth daily. 09/16/17  Yes Fritzi Mandes, MD  buPROPion (WELLBUTRIN XL) 150 MG 24 hr tablet TAKE 1 TABLET BY MOUTH DAILY Patient taking differently: Take 150 mg by mouth daily.  03/18/19  Yes [name], MD  clonazePAM (KLONOPIN) 0.5 MG tablet TAKE ONE TO TWO TABLETS AT BEDTIME Patient taking differently: Take 0.5-1 mg by mouth at bedtime.  10/22/19  Yes [name], MD  finasteride (PROSCAR) 5 MG tablet TAKE ONE TABLET BY MOUTH EVERY DAY Patient taking differently: Take 5 mg by mouth daily.  08/05/19  Yes [name], MD  hydrochlorothiazide (HYDRODIURIL) 25 MG tablet TAKE ONE TABLET EVERY DAY Patient taking differently: Take 25 mg by mouth daily.  07/30/19  Yes [name], MD  losartan (COZAAR) 100 MG tablet TAKE ONE TABLET EVERY DAY Patient taking differently: Take 100 mg by mouth daily.  08/12/19  Yes [name], MD  Multiple Vitamin (MULTIVITAMIN) capsule Take 1 capsule by mouth daily.     Yes [provider]  omeprazole (PRILOSEC) 40 MG capsule TAKE 1 CAPSULE BY MOUTH ONCE DAILY Patient taking differently: Take 40 mg by mouth daily.  04/25/19  Yes [name], MD  pramipexole (MIRAPEX) 1 MG tablet TAKE ONE TABLET AT DINNER AND ONE TABLETAT BEDTIME Patient taking differently: Take 1 mg by mouth 2 (two) times daily. TAKE ONE TABLET AT DINNER  AND ONE TABLETAT BEDTIME 09/16/19  Yes [name], MD  pyridoxine (B-6) 100 MG tablet Take 100 mg by mouth daily.   Yes [provider]  tamsulosin (FLOMAX) 0.4 MG CAPS capsule TAKE 1 CAPSULE EVERY DAY Patient taking differently: Take 0.4 mg by mouth daily.  08/05/19  Yes [name], MD  polyethylene glycol (MIRALAX / GLYCOLAX) packet Take 17 g by mouth daily.    [provider]  citalopram (CELEXA) 10 MG tablet Take 1 tablet (10 mg total) by mouth daily. 04/22/11 05/18/11  [name], MD    Inpatient Medications: Scheduled Meds: . amLODipine  5 mg Oral Daily  . aspirin EC  81 mg Oral Daily  . buPROPion  150 mg Oral Daily  . clonazePAM  0.5  mg Oral QHS  . finasteride  5 mg Oral Daily  . metoprolol tartrate  12.5 mg Oral BID  . pantoprazole  80 mg Oral Daily  . pramipexole  1 mg Oral BID  . tamsulosin  0.4 mg Oral Daily   Continuous Infusions: . sodium chloride 75 mL/hr at 01/02/20 1126  . heparin 1,050 Units/hr (01/02/20 0552)   PRN Meds: acetaminophen, ondansetron (ZOFRAN) IV  Allergies:    Allergies  Allergen Reactions  . Atorvastatin     REACTION: raises blood pressure  . Pravastatin Sodium     REACTION: raises blood pressure  . Statins     REACTION: raises bp    Social History:   Social History   Socioeconomic History  . Marital status: Widowed    Spouse name: Not on file  . Number of children: 2  . Years of education: Not on file  . Highest education level: Not on file  Occupational History  . Occupation: Retired  Tobacco Use  . Smoking status: Former Smoker    Packs/day: 0.75    Years: 30.00    Pack years: 22.50    Quit date: 02/28/1970    Years since quitting: 49.8  . Smokeless tobacco: Never Used  Vaping Use  . Vaping Use: Never used  Substance and Sexual Activity  . Alcohol use: Yes    Alcohol/week: 0.0 standard drinks    Comment: 1-2 glasses wine/daily  . Drug use: No  . Sexual activity: Not on file  Other  Topics Concern  . Not on file  Social History Narrative   Widowed 2010   2 adopted children   Has living will   Daughter is now health care power of attorney   Would accept resuscitation but no prolonged ventilation   No tube feeds if cognitively unaware   Social Determinants of Health   Financial Resource Strain:   . Difficulty of Paying Living Expenses: Not on file  Food Insecurity:   . Worried About Charity fundraiser in the Last Year: Not on file  . Ran Out of Food in the Last Year: Not on file  Transportation Needs:   . Lack of Transportation (Medical): Not on file  . Lack of Transportation (Non-Medical): Not on file  Physical Activity:   . Days of Exercise per Week: Not on file  . Minutes of Exercise per Session: Not on file  Stress:   . Feeling of Stress : Not on file  Social Connections:   . Frequency of Communication with Friends and Family: Not on file  . Frequency of Social Gatherings with Friends and Family: Not on file  . Attends Religious Services: Not on file  . Active Member of Clubs or Organizations: Not on file  . Attends Archivist Meetings: Not on file  . Marital Status: Not on file  Intimate Partner Violence:   . Fear of Current or Ex-Partner: Not on file  . Emotionally Abused: Not on file  . Physically Abused: Not on file  . Sexually Abused: Not on file    Family History:    Family History  Problem Relation Age of Onset  . Other Father        "clogged arteries"  . Hypertension Mother      ROS:  Please see the history of present illness.  Review of Systems  Constitutional: Positive for malaise/fatigue. Negative for diaphoresis.  Respiratory: Negative for cough, shortness of breath and wheezing.   Cardiovascular: Negative for chest  pain, palpitations, orthopnea, leg swelling and PND.  Gastrointestinal: Negative for blood in stool and melena.  Genitourinary: Negative for hematuria.  Musculoskeletal: Positive for falls.    Neurological: Negative for dizziness, focal weakness and loss of consciousness.    All other ROS reviewed and negative.     Physical Exam/Data:   Vitals:   01/02/20 0400 01/02/20 0423 01/02/20 0730 01/02/20 1100  BP:   117/68 120/75  Pulse: (!) 47  60 62  Resp: 19  18 17   Temp:  97.7 F (36.5 C) 97.9 F (36.6 C) 97.9 F (36.6 C)  TempSrc:   Oral Oral  SpO2: 97%  98% 98%  Weight:      Height:        Intake/Output Summary (Last 24 hours) at 01/02/2020 1315 Last data filed at 01/02/2020 1120 Gross per 24 hour  Intake 360 ml  Output 495 ml  Net -135 ml   Last 3 Weights 01/02/2020 01/01/2020 12/04/2019  Weight (lbs) 178 lb 14.4 oz 172 lb 182 lb  Weight (kg) 81.149 kg 78.019 kg 82.555 kg     Body mass index is 28.88 kg/m.  General:  Elderly male, NAD, eating lunch HEENT: normal Lymph: no adenopathy Neck: no JVD Endocrine:  No thryomegaly Vascular: No carotid bruits; FA pulses 2+ bilaterally without bruits  Cardiac:  Soft heart sounds, IRIR and braydcardic; 1/6 systolic murmur Lungs:  L base rhonchi and crackles otherwise CTAB  Abd: soft, nontender, no hepatomegaly  Ext: no pitting edema Musculoskeletal:  No deformities, BUE and BLE strength normal and equal Skin: warm and dry  Neuro:  CNs 2-12 intact, no focal abnormalities noted Psych:  Normal affect   EKG:  The EKG was personally reviewed and demonstrates:  Atrial fibrillation, 64 bpm, right axis deviation, Q waves noted in the septal V1, baseline wander Telemetry:  Telemetry was personally reviewed and demonstrates: Atrial fibrillation, rates 40s to 50s  Relevant CV Studies: Echo 01/01/20 1. Left ventricular ejection fraction, by estimation, is 55 to 60%. The  left ventricle has normal function. The left ventricle has no regional  wall motion abnormalities. There is moderate to severe left ventricular  hypertrophy.  2. Right ventricular systolic function is normal. The right ventricular  size is normal. There is  mildly elevated pulmonary artery systolic  pressure. The estimated right ventricular systolic pressure is 93.8 mmHg.  3. Left atrial size was severely dilated.  4. Mild mitral valve regurgitation.  5. The aortic valve is heavily calcified. Mild aortic valve stenosis.  6. The inferior vena cava is normal in size with <50% respiratory  variability, suggesting right atrial pressure of 8 mmHg.  7. Rhythm is atrial fibrillation   Laboratory Data:  High Sensitivity Troponin:   Recent Labs  Lab 01/01/20 1804 01/01/20 1936 01/01/20 2252  TROPONINIHS 385* 336* 333*     Chemistry Recent Labs  Lab 01/01/20 1455 01/02/20 0753  NA 134* 136  K 3.3* 3.2*  CL 98 102  CO2 23 23  GLUCOSE 118* 130*  BUN 71* 67*  CREATININE 3.85* 3.03*  CALCIUM 8.9 8.5*  GFRNONAA 14* 18*  ANIONGAP 13 11    Recent Labs  Lab 01/01/20 1804  PROT 6.0*  ALBUMIN 3.5  AST 29  ALT 21  ALKPHOS 56  BILITOT 1.2   Hematology Recent Labs  Lab 01/01/20 1455 01/02/20 0637  WBC 8.9 7.4  RBC 3.68* 3.84*  HGB 11.2* 11.7*  HCT 33.4* 34.7*  MCV 90.8 90.4  MCH 30.4 30.5  MCHC 33.5 33.7  RDW 14.4 14.3  PLT 167 166   BNP Recent Labs  Lab 01/01/20 1813  BNP 634.0*    DDimer No results for input(s): DDIMER in the last 168 hours.   Radiology/Studies:  DG Chest 2 View  Result Date: 01/01/2020 CLINICAL DATA:  AFib. EXAM: CHEST - 2 VIEW COMPARISON:  11/01/2017. FINDINGS: Enlarged cardiac silhouette. Aortic atherosclerosis. Left basilar opacities. No visible pleural effusions or pneumothorax. No acute osseous abnormality. IMPRESSION: 1. Left basilar opacities, which may represent atelectasis, aspiration, and/or pneumonia. 2. Cardiomegaly. Electronically Signed   By: Margaretha Sheffield MD   On: 01/01/2020 15:26   CT Head Wo Contrast  Result Date: 01/01/2020 CLINICAL DATA:  Weakness EXAM: CT HEAD WITHOUT CONTRAST TECHNIQUE: Contiguous axial images were obtained from the base of the skull through the  vertex without intravenous contrast. COMPARISON:  09/17/2017 FINDINGS: Brain: No evidence of acute infarction, hemorrhage, hydrocephalus, extra-axial collection or mass lesion/mass effect. Chronic atrophic and ischemic changes are noted. Vascular: No hyperdense vessel or unexpected calcification. Skull: Normal. Negative for fracture or focal lesion. Sinuses/Orbits: No acute finding. Other: Postsurgical changes are noted on the right consistent with cochlear implant. IMPRESSION: Chronic atrophic and ischemic changes. Postsurgical changes without acute abnormality. Electronically Signed   By: Inez Catalina M.D.   On: 01/01/2020 18:59   US Renal  Result Date: 01/01/2020 CLINICAL DATA:  Acute renal injury EXAM: RENAL / URINARY TRACT ULTRASOUND COMPLETE COMPARISON:  None. FINDINGS: Right Kidney: Renal measurements: 10.7 x 4.2 x 5.3 cm. = volume: 123 mL. Cysts are noted within the right kidney. The largest of these measures 3.6 cm in greatest dimension in the midportion of the right kidney. Smaller cysts are noted as well. Overall less than 10 cysts are seen. No mass lesion or hydronephrosis is seen. Left Kidney: Renal measurements: 9.6 x 4.4 x 5.7 cm. = volume: 128 mL. 2.5 cm cyst is noted within the midportion of the left kidney. No mass lesion or hydronephrosis is noted. Bladder: Appears normal for degree of bladder distention. Other: None. IMPRESSION: Bilateral simple renal cysts.  No acute abnormality noted. Electronically Signed   By: Inez Catalina M.D.   On: 01/01/2020 18:57   ECHOCARDIOGRAM COMPLETE  Result Date: 01/02/2020    ECHOCARDIOGRAM REPORT   Patient Name:   HENNING EHLE Date of Exam: 01/01/2020 Medical Rec #:  [id number]       Height:       66.0 in Accession #:    [id number]      Weight:       172.0 lb Date of Birth:  09/04/1923        BSA:          1.876 m Patient Age:    50 years        BP:           142/69 mmHg Patient Gender: M               HR:           63 bpm. Exam Location:  [facility] Procedure:  2D Echo, Cardiac Doppler and Color Doppler Indications:     R55 Syncope  History:         Patient has prior history of Echocardiogram examinations, most                  recent 09/15/2017. Risk Factors:Hypertension and Dyslipidemia.  Murmur. Renal Insufficiency.  Sonographer:     Wilford Sports Rodgers-Jones Referring Phys:  [id number] [place] Diagnosing Phys: Ida Rogue MD IMPRESSIONS  1. Left ventricular ejection fraction, by estimation, is 55 to 60%. The left ventricle has normal function. The left ventricle has no regional wall motion abnormalities. There is moderate to severe left ventricular hypertrophy.  2. Right ventricular systolic function is normal. The right ventricular size is normal. There is mildly elevated pulmonary artery systolic pressure. The estimated right ventricular systolic pressure is 23.7 mmHg.  3. Left atrial size was severely dilated.  4. Mild mitral valve regurgitation.  5. The aortic valve is heavily calcified. Mild aortic valve stenosis.  6. The inferior vena cava is normal in size with <50% respiratory variability, suggesting right atrial pressure of 8 mmHg.  7. Rhythm is atrial fibrillation FINDINGS  Left Ventricle: Left ventricular ejection fraction, by estimation, is 55 to 60%. The left ventricle has normal function. The left ventricle has no regional wall motion abnormalities. The left ventricular internal cavity size was normal in size. There is  moderate to severe left ventricular hypertrophy. Left ventricular diastolic parameters are indeterminate. Right Ventricle: The right ventricular size is normal. No increase in right ventricular wall thickness. Right ventricular systolic function is normal. There is mildly elevated pulmonary artery systolic pressure. The tricuspid regurgitant velocity is 2.74  m/s, and with an assumed right atrial pressure of 10 mmHg, the estimated right ventricular systolic pressure is 62.8 mmHg. Left Atrium: Left atrial size was  severely dilated. Right Atrium: Right atrial size was normal in size. Pericardium: There is no evidence of pericardial effusion. Mitral Valve: The mitral valve is normal in structure. There is moderate calcification of the mitral valve leaflet(s). Mild mitral annular calcification. Mild mitral valve regurgitation. No evidence of mitral valve stenosis. Tricuspid Valve: The tricuspid valve is normal in structure. Tricuspid valve regurgitation is mild . No evidence of tricuspid stenosis. Aortic Valve: The aortic valve is normal in structure. Aortic valve regurgitation is not visualized. Mild aortic stenosis is present. Aortic valve mean gradient measures 9.6 mmHg. Aortic valve peak gradient measures 16.7 mmHg. Aortic valve area, by VTI measures 2.00 cm. Pulmonic Valve: The pulmonic valve was normal in structure. Pulmonic valve regurgitation is mild. No evidence of pulmonic stenosis. Aorta: The aortic root is normal in size and structure. Venous: The inferior vena cava is normal in size with less than 50% respiratory variability, suggesting right atrial pressure of 8 mmHg. IAS/Shunts: No atrial level shunt detected by color flow Doppler.  LEFT VENTRICLE PLAX 2D LVIDd:         3.96 cm LVIDs:         2.65 cm LV PW:         1.44 cm LV IVS:        1.50 cm LVOT diam:     2.10 cm LV SV:         64 LV SV Index:   34 LVOT Area:     3.46 cm  RIGHT VENTRICLE             IVC RV Basal diam:  4.33 cm     IVC diam: 2.01 cm RV S prime:     15.10 cm/s LEFT ATRIUM              Index       RIGHT ATRIUM           Index LA diam:  4.60 cm  2.45 cm/m  RA Area:     27.30 cm LA Vol (A2C):   130.0 ml 69.31 ml/m RA Volume:   102.00 ml 54.38 ml/m LA Vol (A4C):   101.0 ml 53.85 ml/m LA Biplane Vol: 120.0 ml 63.98 ml/m  AORTIC VALVE AV Area (Vmax):    1.66 cm AV Area (Vmean):   1.83 cm AV Area (VTI):     2.00 cm AV Vmax:           204.40 cm/s AV Vmean:          145.000 cm/s AV VTI:            0.319 m AV Peak Grad:      16.7 mmHg AV  Mean Grad:      9.6 mmHg LVOT Vmax:         98.00 cm/s LVOT Vmean:        76.633 cm/s LVOT VTI:          0.184 m LVOT/AV VTI ratio: 0.58  AORTA Ao Root diam: 3.60 cm MV E velocity: 137.00 cm/s  TRICUSPID VALVE                             TR Peak grad:   30.0 mmHg                             TR Vmax:        274.00 cm/s                              SHUNTS                             Systemic VTI:  0.18 m                             Systemic Diam: 2.10 cm Ida Rogue MD Electronically signed by Ida Rogue MD Signature Date/Time: 01/02/2020/8:42:54 AM    Final      Assessment and Plan:   Newly diagnosed atrial fibrillation with slow ventricular rate --Reports significant fatigue over the last week.  No tachypalpitations or dizziness.  Suspect atrial fibrillation triggered by low K at presentation. TSH wnl. --Ventricular rate well controlled bradycardic.  Decrease metoprolol from 25 mg twice daily to 12.5 mg twice daily.  He continues to note fatigue at this time.  Continue IV heparin.  Discussed anticoagulation with patient agreement.  At discharge, recommend reduced dose Eliquis 2.5 mg twice daily given he meets criteria for reduced dose anticoagulation by both age and renal function.  He denies any signs or symptoms of bleeding.  He denies frequent falls.  He does have anemia, suspected as anemia of chronic disease.  Daily BMET, CBC.  Replete electrolytes. K 3.2 with goal 4.0 as below.  Consider further workup of L sided lung findings on CXR.  Discussed that DCCV can be performed as an outpatient after 1 month of therapeutic anticoagulation.  Given his severely dilated left atrium, it is uncertain if he would hold NSR.  Also discussed was possible TEE/DCCV.  However, suspect further improvement of his fatigue with decreased dose beta-blocker, and improvement in his bradycardia.  Again, given his severe left atrium enlargement, it is uncertain if he will  hold NSR.  Considered was outpatient  sleep study given LAE but with consideration of his age.  Hypokalemia --K 3.2.  Replete with goal 4.0. --Check magnesium with goal 2.0.   --Daily BMET. --Consider as trigger for Afib. Maintain at goal.   Anemia --Likely of chronic disease. --No s/sx of bleeding. --Consider nephrology consult as outlined below. --Consider as contributing to his fatigue.  Elevated HS Tn Supply demand ischemia --No chest pain. High-sensitivity troponin peaked at 385 in the setting of atrial fibrillation, hypokalemia, anemia. --EKG without acute changes when compared with 08/2017 EKG. --Suspect supply demand ischemia in the setting of atrial fibrillation, CKD, anemia, and possible pneumonia. EF 55 to 60%, NRWMA. --No plan for emergent ischemic evaluation at this time.  He also has AKI with elevated creatinine and is not an ideal candidate for contrast exposure. --Recommend outpatient follow-up. If CP or anginal sx, could likely benefit from outpatient ischemic work-up once Cr but will defer for now. --Recommend aggressive treatment of risk factors.  HTN --BP well controlled.  Continue current medications.  HFpEF --No SOB. Reports fatigue. --Echo shows EF nl, RVSP 40.0 mmHg, severe LAE, RAP 8 mmHg.   --Appears relatively euvolemic on exam.  --Monitor I/Os, daily weights. --Daily BMET.  Monitor electrolytes. --Continue BB. --Given his elevated right heart pressures and dilated LA, consider outpatient sleep study with consideration of his age. Also consider further workup of CXR findings per IM/pulmonology. Would not recommend CTA given renal function. Could consider VQ scan.   AOCKD --AKI noted at presentation with creatinine 3.85 and previous creatinine 1.55. --Recommend nephrology consultation. --Caution with contrast.  Avoid nephrotoxins.      TIMI Risk Score for Unstable Angina or Non-ST Elevation MI:   The patient's TIMI risk score is 2, which indicates a 8% risk of all cause mortality, new  or recurrent myocardial infarction or need for urgent revascularization in the next 14 days.      CHA2DS2-VASc Score = 7  This indicates a 11.2% annual risk of stroke. The patient's score is based upon: CHF History: 1 HTN History: 1 Diabetes History: 0 Stroke History: 2 Vascular Disease History: 1 Age Score: 2 Gender Score: 0         For questions or updates, please contact [facility] Please consult [email or web address] for contact info under    Signed, Arvil Chaco, PA-C  01/02/2020 1:15 PM

## 2020-01-02 NOTE — Progress Notes (Signed)
[place] for heparin Indication: chest pain/ACS  Allergies  Allergen Reactions  . Atorvastatin     REACTION: raises blood pressure  . Pravastatin Sodium     REACTION: raises blood pressure  . Statins     REACTION: raises bp    Patient Measurements: Height: 5\' 6"  (167.6 cm) Weight: 81.1 kg (178 lb 14.4 oz) IBW/kg (Calculated) : 63.8 Heparin Dosing Weight: 78 kg  Vital Signs: Temp: 97.7 F (36.5 C) (11/04 0423) Temp Source: Oral (11/04 0150) BP: 121/75 (11/04 0150) Pulse Rate: 47 (11/04 0400)  Labs: Recent Labs    01/01/20 1455 01/01/20 1804 01/01/20 1936 01/01/20 2030 01/01/20 2252 01/02/20 0405  HGB 11.2*  --   --   --   --   --   HCT 33.4*  --   --   --   --   --   PLT 167  --   --   --   --   --   APTT  --   --   --  >160*  --   --   LABPROT  --   --   --  13.7  --   --   INR  --   --   --  1.1  --   --   HEPARINUNFRC  --   --   --   --   --  0.21*  CREATININE 3.85*  --   --   --   --   --   TROPONINIHS  --  385* 336*  --  333*  --     Estimated Creatinine Clearance: 11.7 mL/min (A) (by C-G formula based on SCr of 3.85 mg/dL (H)).   Medical History: Past Medical History:  Diagnosis Date  . Actinic keratosis   . BPH (benign prostatic hypertrophy)   . Diseases of lips   . Diverticulosis of colon 06/2003  . ED (erectile dysfunction)   . GERD (gastroesophageal reflux disease)   . Heart murmur    as child  . HLD (hyperlipidemia)   . HOH (hard of hearing)    Cochlear implant right, hearing aid left  . HTN (hypertension)   . Numbness and tingling in both hands    intermittent, trying braces   . Osteoarthritis of knee    severe, bilat  . Renal insufficiency   . Restless legs syndrome (RLS)   . Sleep disturbance, unspecified   . Spinal stenosis, lumbar region, without neurogenic claudication      Assessment: 86 year old male who presents following syncopal episode. States he was told by EMS that he has new  afib. No anticoagulation listed PTA. Pharmacy consult for heparin for ACS/STEMI.   Goal of Therapy:  Heparin level 0.3-0.7 units/ml Monitor platelets by anticoagulation protocol: Yes   Plan:  Heparin 4000 unit bolus followed by heparin drip at 900 units/hr. HL tomorrow at 0400. CBC daily while on heparin drip.  Yoskar Murrillo D, PharmD 01/02/2020,5:43 AM

## 2020-01-02 NOTE — Progress Notes (Signed)
[place] for heparin Indication: chest pain/ACS  Allergies  Allergen Reactions  . Atorvastatin     REACTION: raises blood pressure  . Pravastatin Sodium     REACTION: raises blood pressure  . Statins     REACTION: raises bp    Patient Measurements: Height: 5\' 6"  (167.6 cm) Weight: 81.1 kg (178 lb 14.4 oz) IBW/kg (Calculated) : 63.8 Heparin Dosing Weight: 78 kg  Vital Signs: Temp: 97.7 F (36.5 C) (11/04 0423) Temp Source: Oral (11/04 0150) BP: 121/75 (11/04 0150) Pulse Rate: 47 (11/04 0400)  Labs: Recent Labs    01/01/20 1455 01/01/20 1804 01/01/20 1936 01/01/20 2030 01/01/20 2252 01/02/20 0405  HGB 11.2*  --   --   --   --   --   HCT 33.4*  --   --   --   --   --   PLT 167  --   --   --   --   --   APTT  --   --   --  >160*  --   --   LABPROT  --   --   --  13.7  --   --   INR  --   --   --  1.1  --   --   HEPARINUNFRC  --   --   --   --   --  0.21*  CREATININE 3.85*  --   --   --   --   --   TROPONINIHS  --  385* 336*  --  333*  --     Estimated Creatinine Clearance: 11.7 mL/min (A) (by C-G formula based on SCr of 3.85 mg/dL (H)).   Medical History: Past Medical History:  Diagnosis Date  . Actinic keratosis   . BPH (benign prostatic hypertrophy)   . Diseases of lips   . Diverticulosis of colon 06/2003  . ED (erectile dysfunction)   . GERD (gastroesophageal reflux disease)   . Heart murmur    as child  . HLD (hyperlipidemia)   . HOH (hard of hearing)    Cochlear implant right, hearing aid left  . HTN (hypertension)   . Numbness and tingling in both hands    intermittent, trying braces   . Osteoarthritis of knee    severe, bilat  . Renal insufficiency   . Restless legs syndrome (RLS)   . Sleep disturbance, unspecified   . Spinal stenosis, lumbar region, without neurogenic claudication      Assessment: 86 year old male who presents following syncopal episode. States he was told by EMS that he has new  afib. No anticoagulation listed PTA. Pharmacy consult for heparin for ACS/STEMI.   Goal of Therapy:  Heparin level 0.3-0.7 units/ml Monitor platelets by anticoagulation protocol: Yes   Plan:  11/4: HL @ 0405 = 0.21 Will order Heparin 1150 units IV X 1 and increase drip rate to 1050 units/hr.  Will recheck HL 8 hrs after rate change.   Majd Tissue D, PharmD 01/02/2020,5:51 AM

## 2020-01-02 NOTE — Progress Notes (Signed)
PROGRESS NOTE    Stephen Garrett  EXN:[id number] DOB: February 22, 1924 DOA: 01/01/2020 PCP: [name], MD     Brief Narrative:  Stephen Garrett is a 86 y.o. male with medical history significant of hypertension, decreased hearing, TIAs, restless leg syndrome, hyperlipidemia, BPH, GERD, status post cochlear implant and spinal stenosis who was here with his daughter presenting with presyncopal episodes and palpitations.  He lives in assisted living facility where he was evaluated.  Patient found to have new onset A. fib.  He was brought in where he was seen in the ER and evaluated.  He appears to have some mild EKG changes but significant increase in his troponins.  Patient said he fell yesterday while sitting playing bridge.  He did not hit his head.  He did not feel any chest pain. Today he repeated episode where he was noted to have passed out midsentence prior to coming in.  He is fully with it but has poor hearing despite cochlear implant.  He was admitted with non-ST elevation MI and new onset atrial fibrillation, AKI.   New events last 24 hours / Subjective: Patient denies any chest pain, shortness of breath.  Admits to some mild dizziness.  His main complaint has been fatigue.   Assessment & Plan:   Principal Problem:   NSTEMI (non-ST elevated myocardial infarction) ([place]) Active Problems:   Essential hypertension, benign   GERD   Chronic venous insufficiency   Acute renal failure superimposed on stage 3 chronic kidney disease (HCC)   Hypokalemia   A-fib (HCC)   NSTEMI -Troponin peak at 385 -Echocardiogram showed EF 55 to 60%, no regional wall motion abnormality, moderate to severe LVH -Continue IV heparin -Aspirin  -Cardiology consulted  New onset atrial fibrillation -CHA2DS2-VASc 3 -Continue IV heparin -Rate controlled in the 60s today -Lopressor   AKI on CKD stage IIIb -Admitted with creatinine 3.85, improved to 3.03 -Baseline creatinine 1.4 -Hold HCTZ, Cozaar.  Avoid nephrotoxins  -IVF   HTN  -Continue Norvasc -Hold HCTZ, Cozaar due to AKI  Depression/anxiety  -Continue Wellbutrin, Klonopin  BPH  -Continue Proscar, Flomax  GERD -Continue PPI   Hypokalemia -Replace, trend     DVT prophylaxis: IV heparin   Code Status: Full code Family Communication: No family at bedside Disposition Plan:  Status is: Inpatient  Remains inpatient appropriate because:IV treatments appropriate due to intensity of illness or inability to take PO and Inpatient level of care appropriate due to severity of illness   Dispo: The patient is from: Independent living facility              Anticipated d/c is to: Independent living facility              Anticipated d/c date is: 2 days              Patient currently is not medically stable to d/c.  Remains on IV heparin.  Awaiting cardiology consultation.   Consultants:   Cardiology  Procedures:   None  Antimicrobials:  Anti-infectives (From admission, onward)   None        Objective: Vitals:   01/02/20 0300 01/02/20 0400 01/02/20 0423 01/02/20 0730  BP:    117/68  Pulse:  (!) 47  60  Resp:  19  18  Temp:   97.7 F (36.5 C) 97.9 F (36.6 C)  TempSrc:    Oral  SpO2:  97%  98%  Weight: 81.1 kg     Height: 5\' 6"  (1.676  m)       Intake/Output Summary (Last 24 hours) at 01/02/2020 1022 Last data filed at 01/02/2020 0919 Gross per 24 hour  Intake 240 ml  Output 445 ml  Net -205 ml   Filed Weights   01/01/20 1451 01/02/20 0300  Weight: 78 kg 81.1 kg    Examination:  General exam: Appears calm and comfortable  Respiratory system: Clear to auscultation. Respiratory effort normal. No respiratory distress. No conversational dyspnea.  Cardiovascular system: S1 & S2 heard, irregular rhythm, rate 60s. No murmurs.  Gastrointestinal system: Abdomen is nondistended, soft and nontender. Normal bowel sounds heard. Central nervous system: Alert and oriented. No focal neurological deficits.  Speech clear.  Extremities: Symmetric in appearance  Skin: No rashes, lesions or ulcers on exposed skin  Psychiatry: Judgement and insight appear normal. Mood & affect appropriate.   Data Reviewed: I have personally reviewed following labs and imaging studies  CBC: Recent Labs  Lab 01/01/20 1455 01/02/20 0637  WBC 8.9 7.4  HGB 11.2* 11.7*  HCT 33.4* 34.7*  MCV 90.8 90.4  PLT 167 834   Basic Metabolic Panel: Recent Labs  Lab 01/01/20 1455 01/01/20 1804 01/02/20 0753  NA 134*  --  136  K 3.3*  --  3.2*  CL 98  --  102  CO2 23  --  23  GLUCOSE 118*  --  130*  BUN 71*  --  67*  CREATININE 3.85*  --  3.03*  CALCIUM 8.9  --  8.5*  MG  --  2.1  --    GFR: Estimated Creatinine Clearance: 14.9 mL/min (A) (by C-G formula based on SCr of 3.03 mg/dL (H)). Liver Function Tests: Recent Labs  Lab 01/01/20 1804  AST 29  ALT 21  ALKPHOS 56  BILITOT 1.2  PROT 6.0*  ALBUMIN 3.5   No results for input(s): LIPASE, AMYLASE in the last 168 hours. No results for input(s): AMMONIA in the last 168 hours. Coagulation Profile: Recent Labs  Lab 01/01/20 2030  INR 1.1   Cardiac Enzymes: No results for input(s): CKTOTAL, CKMB, CKMBINDEX, TROPONINI in the last 168 hours. BNP (last 3 results) No results for input(s): PROBNP in the last 8760 hours. HbA1C: No results for input(s): HGBA1C in the last 72 hours. CBG: No results for input(s): GLUCAP in the last 168 hours. Lipid Profile: Recent Labs    01/01/20 2252  CHOL 199  HDL 37*  LDLCALC 148*  TRIG 70  CHOLHDL 5.4   Thyroid Function Tests: Recent Labs    01/01/20 1804  TSH 3.231   Anemia Panel: No results for input(s): VITAMINB12, FOLATE, FERRITIN, TIBC, IRON, RETICCTPCT in the last 72 hours. Sepsis Labs: No results for input(s): PROCALCITON, LATICACIDVEN in the last 168 hours.  Recent Results (from the past 240 hour(s))  Respiratory Panel by RT PCR (Flu A&B, Covid) - Nasopharyngeal Swab     Status: None    Collection Time: 01/01/20  7:07 PM   Specimen: Nasopharyngeal Swab  Result Value Ref Range Status   SARS Coronavirus 2 by RT PCR NEGATIVE NEGATIVE Final    Comment: (NOTE) SARS-CoV-2 target nucleic acids are NOT DETECTED.  The SARS-CoV-2 RNA is generally detectable in upper respiratoy specimens during the acute phase of infection. The lowest concentration of SARS-CoV-2 viral copies this assay can detect is 131 copies/mL. A negative result does not preclude SARS-Cov-2 infection and should not be used as the sole basis for treatment or other patient management decisions. A negative result may occur  with  improper specimen collection/handling, submission of specimen other than nasopharyngeal swab, presence of viral mutation(s) within the areas targeted by this assay, and inadequate number of viral copies (<131 copies/mL). A negative result must be combined with clinical observations, patient history, and epidemiological information. The expected result is Negative.  Fact Sheet for Patients:  PinkCheek.be  Fact Sheet for Healthcare Providers:  GravelBags.it  This test is no t yet approved or cleared by the [place] FDA and  has been authorized for detection and/or diagnosis of SARS-CoV-2 by FDA under an Emergency Use Authorization (EUA). This EUA will remain  in effect (meaning this test can be used) for the duration of the COVID-19 declaration under Section 564(b)(1) of the Act, 21 U.S.C. section 360bbb-3(b)(1), unless the authorization is terminated or revoked sooner.     Influenza A by PCR NEGATIVE NEGATIVE Final   Influenza B by PCR NEGATIVE NEGATIVE Final    Comment: (NOTE) The Xpert Xpress SARS-CoV-2/FLU/RSV assay is intended as an aid in  the diagnosis of influenza from Nasopharyngeal swab specimens and  should not be used as a sole basis for treatment. Nasal washings and  aspirates are unacceptable for Xpert  Xpress SARS-CoV-2/FLU/RSV  testing.  Fact Sheet for Patients: PinkCheek.be  Fact Sheet for Healthcare Providers: GravelBags.it  This test is not yet approved or cleared by the [place] FDA and  has been authorized for detection and/or diagnosis of SARS-CoV-2 by  FDA under an Emergency Use Authorization (EUA). This EUA will remain  in effect (meaning this test can be used) for the duration of the  Covid-19 declaration under Section 564(b)(1) of the Act, 21  U.S.C. section 360bbb-3(b)(1), unless the authorization is  terminated or revoked. Performed at [facility], [address], [place], [place] [address]       Radiology Studies: DG Chest 2 View  Result Date: 01/01/2020 CLINICAL DATA:  AFib. EXAM: CHEST - 2 VIEW COMPARISON:  11/01/2017. FINDINGS: Enlarged cardiac silhouette. Aortic atherosclerosis. Left basilar opacities. No visible pleural effusions or pneumothorax. No acute osseous abnormality. IMPRESSION: 1. Left basilar opacities, which may represent atelectasis, aspiration, and/or pneumonia. 2. Cardiomegaly. Electronically Signed   By: Margaretha Sheffield MD   On: 01/01/2020 15:26   CT Head Wo Contrast  Result Date: 01/01/2020 CLINICAL DATA:  Weakness EXAM: CT HEAD WITHOUT CONTRAST TECHNIQUE: Contiguous axial images were obtained from the base of the skull through the vertex without intravenous contrast. COMPARISON:  09/17/2017 FINDINGS: Brain: No evidence of acute infarction, hemorrhage, hydrocephalus, extra-axial collection or mass lesion/mass effect. Chronic atrophic and ischemic changes are noted. Vascular: No hyperdense vessel or unexpected calcification. Skull: Normal. Negative for fracture or focal lesion. Sinuses/Orbits: No acute finding. Other: Postsurgical changes are noted on the right consistent with cochlear implant. IMPRESSION: Chronic atrophic and ischemic changes. Postsurgical changes without  acute abnormality. Electronically Signed   By: Inez Catalina M.D.   On: 01/01/2020 18:59   US Renal  Result Date: 01/01/2020 CLINICAL DATA:  Acute renal injury EXAM: RENAL / URINARY TRACT ULTRASOUND COMPLETE COMPARISON:  None. FINDINGS: Right Kidney: Renal measurements: 10.7 x 4.2 x 5.3 cm. = volume: 123 mL. Cysts are noted within the right kidney. The largest of these measures 3.6 cm in greatest dimension in the midportion of the right kidney. Smaller cysts are noted as well. Overall less than 10 cysts are seen. No mass lesion or hydronephrosis is seen. Left Kidney: Renal measurements: 9.6 x 4.4 x 5.7 cm. = volume: 128 mL. 2.5 cm  cyst is noted within the midportion of the left kidney. No mass lesion or hydronephrosis is noted. Bladder: Appears normal for degree of bladder distention. Other: None. IMPRESSION: Bilateral simple renal cysts.  No acute abnormality noted. Electronically Signed   By: Inez Catalina M.D.   On: 01/01/2020 18:57   ECHOCARDIOGRAM COMPLETE  Result Date: 01/02/2020    ECHOCARDIOGRAM REPORT   Patient Name:   HOBY KAWAI Date of Exam: 01/01/2020 Medical Rec #:  [id number]       Height:       66.0 in Accession #:    [id number]      Weight:       172.0 lb Date of Birth:  Mar 10, 1923        BSA:          1.876 m Patient Age:    68 years        BP:           142/69 mmHg Patient Gender: M               HR:           63 bpm. Exam Location:  [facility] Procedure: 2D Echo, Cardiac Doppler and Color Doppler Indications:     R55 Syncope  History:         Patient has prior history of Echocardiogram examinations, most                  recent 09/15/2017. Risk Factors:Hypertension and Dyslipidemia.                  Murmur. Renal Insufficiency.  Sonographer:     Wilford Sports Rodgers-Jones Referring Phys:  [id number] [place] Diagnosing Phys: Ida Rogue MD IMPRESSIONS  1. Left ventricular ejection fraction, by estimation, is 55 to 60%. The left ventricle has normal function. The left ventricle has no regional  wall motion abnormalities. There is moderate to severe left ventricular hypertrophy.  2. Right ventricular systolic function is normal. The right ventricular size is normal. There is mildly elevated pulmonary artery systolic pressure. The estimated right ventricular systolic pressure is 46.5 mmHg.  3. Left atrial size was severely dilated.  4. Mild mitral valve regurgitation.  5. The aortic valve is heavily calcified. Mild aortic valve stenosis.  6. The inferior vena cava is normal in size with <50% respiratory variability, suggesting right atrial pressure of 8 mmHg.  7. Rhythm is atrial fibrillation FINDINGS  Left Ventricle: Left ventricular ejection fraction, by estimation, is 55 to 60%. The left ventricle has normal function. The left ventricle has no regional wall motion abnormalities. The left ventricular internal cavity size was normal in size. There is  moderate to severe left ventricular hypertrophy. Left ventricular diastolic parameters are indeterminate. Right Ventricle: The right ventricular size is normal. No increase in right ventricular wall thickness. Right ventricular systolic function is normal. There is mildly elevated pulmonary artery systolic pressure. The tricuspid regurgitant velocity is 2.74  m/s, and with an assumed right atrial pressure of 10 mmHg, the estimated right ventricular systolic pressure is 03.5 mmHg. Left Atrium: Left atrial size was severely dilated. Right Atrium: Right atrial size was normal in size. Pericardium: There is no evidence of pericardial effusion. Mitral Valve: The mitral valve is normal in structure. There is moderate calcification of the mitral valve leaflet(s). Mild mitral annular calcification. Mild mitral valve regurgitation. No evidence of mitral valve stenosis. Tricuspid Valve: The tricuspid valve is normal in structure. Tricuspid valve regurgitation is mild .  No evidence of tricuspid stenosis. Aortic Valve: The aortic valve is normal in structure. Aortic valve  regurgitation is not visualized. Mild aortic stenosis is present. Aortic valve mean gradient measures 9.6 mmHg. Aortic valve peak gradient measures 16.7 mmHg. Aortic valve area, by VTI measures 2.00 cm. Pulmonic Valve: The pulmonic valve was normal in structure. Pulmonic valve regurgitation is mild. No evidence of pulmonic stenosis. Aorta: The aortic root is normal in size and structure. Venous: The inferior vena cava is normal in size with less than 50% respiratory variability, suggesting right atrial pressure of 8 mmHg. IAS/Shunts: No atrial level shunt detected by color flow Doppler.  LEFT VENTRICLE PLAX 2D LVIDd:         3.96 cm LVIDs:         2.65 cm LV PW:         1.44 cm LV IVS:        1.50 cm LVOT diam:     2.10 cm LV SV:         64 LV SV Index:   34 LVOT Area:     3.46 cm  RIGHT VENTRICLE             IVC RV Basal diam:  4.33 cm     IVC diam: 2.01 cm RV S prime:     15.10 cm/s LEFT ATRIUM              Index       RIGHT ATRIUM           Index LA diam:        4.60 cm  2.45 cm/m  RA Area:     27.30 cm LA Vol (A2C):   130.0 ml 69.31 ml/m RA Volume:   102.00 ml 54.38 ml/m LA Vol (A4C):   101.0 ml 53.85 ml/m LA Biplane Vol: 120.0 ml 63.98 ml/m  AORTIC VALVE AV Area (Vmax):    1.66 cm AV Area (Vmean):   1.83 cm AV Area (VTI):     2.00 cm AV Vmax:           204.40 cm/s AV Vmean:          145.000 cm/s AV VTI:            0.319 m AV Peak Grad:      16.7 mmHg AV Mean Grad:      9.6 mmHg LVOT Vmax:         98.00 cm/s LVOT Vmean:        76.633 cm/s LVOT VTI:          0.184 m LVOT/AV VTI ratio: 0.58  AORTA Ao Root diam: 3.60 cm MV E velocity: 137.00 cm/s  TRICUSPID VALVE                             TR Peak grad:   30.0 mmHg                             TR Vmax:        274.00 cm/s                              SHUNTS                             Systemic VTI:  0.18 m                             Systemic Diam: 2.10 cm Ida Rogue MD Electronically signed by Ida Rogue MD Signature Date/Time: 01/02/2020/8:42:54  AM    Final       Scheduled Meds: . aspirin EC  81 mg Oral Daily  . metoprolol tartrate  25 mg Oral BID   Continuous Infusions: . heparin 1,050 Units/hr (01/02/20 0552)     LOS: 1 day      Time spent: 35 minutes   Dessa Phi, DO [facility] 01/02/2020, 10:22 AM   Available via Epic secure chat 7am-7pm After these hours, please refer to coverage provider listed on [email or web address]

## 2020-01-03 DIAGNOSIS — R55 Syncope and collapse: Secondary | ICD-10-CM | POA: Diagnosis not present

## 2020-01-03 DIAGNOSIS — N179 Acute kidney failure, unspecified: Secondary | ICD-10-CM | POA: Diagnosis not present

## 2020-01-03 DIAGNOSIS — I4891 Unspecified atrial fibrillation: Secondary | ICD-10-CM | POA: Diagnosis not present

## 2020-01-03 DIAGNOSIS — N1832 Chronic kidney disease, stage 3b: Secondary | ICD-10-CM | POA: Diagnosis not present

## 2020-01-03 LAB — BASIC METABOLIC PANEL
Anion gap: 12 (ref 5–15)
BUN: 64 mg/dL — ABNORMAL HIGH (ref 8–23)
CO2: 19 mmol/L — ABNORMAL LOW (ref 22–32)
Calcium: 8.9 mg/dL (ref 8.9–10.3)
Chloride: 104 mmol/L (ref 98–111)
Creatinine, Ser: 2.46 mg/dL — ABNORMAL HIGH (ref 0.61–1.24)
GFR, Estimated: 24 mL/min — ABNORMAL LOW (ref >60)
Glucose, Bld: 99 mg/dL (ref 70–99)
Potassium: 4.1 mmol/L (ref 3.5–5.1)
Sodium: 135 mmol/L (ref 135–145)

## 2020-01-03 LAB — CBC
HCT: 34.3 % — ABNORMAL LOW (ref 39.0–52.0)
Hemoglobin: 11.5 g/dL — ABNORMAL LOW (ref 13.0–17.0)
MCH: 30.7 pg (ref 26.0–34.0)
MCHC: 33.5 g/dL (ref 30.0–36.0)
MCV: 91.7 fL (ref 80.0–100.0)
Platelets: 166 10*3/uL (ref 150–400)
RBC: 3.74 MIL/uL — ABNORMAL LOW (ref 4.22–5.81)
RDW: 14.5 % (ref 11.5–15.5)
WBC: 7.4 10*3/uL (ref 4.0–10.5)
nRBC: 0 % (ref 0.0–0.2)

## 2020-01-03 LAB — TROPONIN I (HIGH SENSITIVITY): Troponin I (High Sensitivity): 241 ng/L (ref <18)

## 2020-01-03 LAB — HEPARIN LEVEL (UNFRACTIONATED): Heparin Unfractionated: 0.69 IU/mL (ref 0.30–0.70)

## 2020-01-03 LAB — MAGNESIUM: Magnesium: 2.2 mg/dL (ref 1.7–2.4)

## 2020-01-03 MED ORDER — APIXABAN 2.5 MG PO TABS
2.5000 mg | ORAL_TABLET | Freq: Two times a day (BID) | ORAL | Status: DC
  Administered 2020-01-03 – 2020-01-05 (×5): 2.5 mg via ORAL
  Filled 2020-01-03 (×5): qty 1

## 2020-01-03 MED ORDER — POLYETHYLENE GLYCOL 3350 17 G PO PACK
17.0000 g | PACK | Freq: Once | ORAL | Status: AC
  Administered 2020-01-03: 17 g via ORAL
  Filled 2020-01-03: qty 1

## 2020-01-03 NOTE — Progress Notes (Signed)
Progress Note  Patient Name: Stephen Garrett Date of Encounter: 01/03/2020  Primary Cardiologist: Fletcher Anon  Subjective   No chest pain, dyspnea, palpitations, dizziness, presyncope, or syncope.  Renal function and potassium improving.  Hemoglobin mildly low though stable.  He remains in A. fib with controlled ventricular response with heart rates in the 50s to 70s bpm.  He remains on heparin drip.  Some slow oozing noted from left antecubital venipuncture site this morning.  BP stable.  Inpatient Medications    Scheduled Meds: . amLODipine  5 mg Oral Daily  . aspirin EC  81 mg Oral Daily  . buPROPion  150 mg Oral Daily  . clonazePAM  0.5 mg Oral QHS  . finasteride  5 mg Oral Daily  . pantoprazole  80 mg Oral Daily  . pramipexole  1 mg Oral BID  . tamsulosin  0.4 mg Oral Daily   Continuous Infusions: . sodium chloride 75 mL/hr at 01/02/20 1126  . heparin 1,250 Units/hr (01/02/20 1523)   PRN Meds: acetaminophen, ondansetron (ZOFRAN) IV   Vital Signs    Vitals:   01/02/20 1524 01/02/20 1928 01/03/20 0432 01/03/20 0758  BP: 134/84 122/77 (!) 118/54 (!) 145/83  Pulse: (!) 54 (!) 53 (!) 58 69  Resp: 16 20 (!) 22 (!) 21  Temp: 97.9 F (36.6 C) 97.6 F (36.4 C) 97.7 F (36.5 C) 98.4 F (36.9 C)  TempSrc:  Oral Oral   SpO2: 98% 97% 94% 97%  Weight:      Height:        Intake/Output Summary (Last 24 hours) at 01/03/2020 0833 Last data filed at 01/02/2020 1810 Gross per 24 hour  Intake 600 ml  Output 170 ml  Net 430 ml   Filed Weights   01/01/20 1451 01/02/20 0300  Weight: 78 kg 81.1 kg    Telemetry    A. fib with controlled ventricular response with ventricular rates in the 50s to 70s bpm - Personally Reviewed  ECG    No new tracings - Personally Reviewed  Physical Exam   GEN:  Elderly-appearing, no acute distress.   Neck: No JVD. Cardiac:  Irregularly irregular, no murmurs, rubs, or gallops.  Respiratory: Clear to auscultation bilaterally.  GI: Soft,  nontender, non-distended.   MS: No edema; No deformity. Neuro:  Alert and oriented x 3; Nonfocal.  Psych: Normal affect.  Labs    Chemistry Recent Labs  Lab 01/01/20 1455 01/01/20 1804 01/02/20 0753 01/03/20 0350  NA 134*  --  136 135  K 3.3*  --  3.2* 4.1  CL 98  --  102 104  CO2 23  --  23 19*  GLUCOSE 118*  --  130* 99  BUN 71*  --  67* 64*  CREATININE 3.85*  --  3.03* 2.46*  CALCIUM 8.9  --  8.5* 8.9  PROT  --  6.0*  --   --   ALBUMIN  --  3.5  --   --   AST  --  29  --   --   ALT  --  21  --   --   ALKPHOS  --  56  --   --   BILITOT  --  1.2  --   --   GFRNONAA 14*  --  18* 24*  ANIONGAP 13  --  11 12     Hematology Recent Labs  Lab 01/01/20 1455 01/02/20 0637 01/03/20 0350  WBC 8.9 7.4 7.4  RBC  3.68* 3.84* 3.74*  HGB 11.2* 11.7* 11.5*  HCT 33.4* 34.7* 34.3*  MCV 90.8 90.4 91.7  MCH 30.4 30.5 30.7  MCHC 33.5 33.7 33.5  RDW 14.4 14.3 14.5  PLT 167 166 166    Cardiac EnzymesNo results for input(s): TROPONINI in the last 168 hours. No results for input(s): TROPIPOC in the last 168 hours.   BNP Recent Labs  Lab 01/01/20 1813  BNP 634.0*     DDimer No results for input(s): DDIMER in the last 168 hours.   Radiology    DG Chest 2 View  Result Date: 01/01/2020 IMPRESSION: 1. Left basilar opacities, which may represent atelectasis, aspiration, and/or pneumonia. 2. Cardiomegaly. Electronically Signed   By: Margaretha Sheffield MD   On: 01/01/2020 15:26   CT Head Wo Contrast  Result Date: 01/01/2020 IMPRESSION: Chronic atrophic and ischemic changes. Postsurgical changes without acute abnormality. Electronically Signed   By: Inez Catalina M.D.   On: 01/01/2020 18:59   US Renal  Result Date: 01/01/2020 IMPRESSION: Bilateral simple renal cysts.  No acute abnormality noted. Electronically Signed   By: Inez Catalina M.D.   On: 01/01/2020 18:57    Cardiac Studies   2D echo 01/01/2020: 1. Left ventricular ejection fraction, by estimation, is 55 to 60%. The    left ventricle has normal function. The left ventricle has no regional  wall motion abnormalities. There is moderate to severe left ventricular  hypertrophy.  2. Right ventricular systolic function is normal. The right ventricular  size is normal. There is mildly elevated pulmonary artery systolic  pressure. The estimated right ventricular systolic pressure is 88.4 mmHg.  3. Left atrial size was severely dilated.  4. Mild mitral valve regurgitation.  5. The aortic valve is heavily calcified. Mild aortic valve stenosis.  6. The inferior vena cava is normal in size with <50% respiratory  variability, suggesting right atrial pressure of 8 mmHg.  7. Rhythm is atrial fibrillation   Patient Profile     86 y.o. male with history of reported TIAs, HTN, HLD, BPH, decreased hearing status post cochlear implant, spinal stenosis, remote tobacco use, and GERD  who we are seeing for newly diagnosed A. fib and elevated troponin.  Assessment & Plan    1.  A. fib: -Of uncertain chronicity, though given severely dilated left atrium noted on echo this admission suspect this is been at a minimum persistent and possibly permanent -Ventricular rates are well controlled without AV nodal blocking medications -Asymptomatic -Given the patient is asymptomatic and in the context of his advanced age, we will pursue rate control strategy only -CHA2DS2-VASc 5 (HTN, age x2, TIA x2) -Prior to discharge recommend transitioning from heparin to [place] pending renal function -Estimated Creatinine Clearance: 18.4 mL/min (A) (by C-G formula based on SCr of 2.46 mg/dL (H)).  2.  Acute on CKD stage III: -Improving with IV fluids -Continue to hold HCTZ and losartan -Avoid nephrotoxins  3.  Elevated troponin: -Minimal elevation in high-sensitivity troponin peaking at 385 -Likely supply demand ischemia in the setting of acute on CKD with dehydration and LVH -Not consistent with ACS -Echo with preserved LVSF and no  wall motion abnormalities -Given lack of anginal symptoms, advanced age, and acute on CKD, there are no plans for inpatient ischemic evaluation -Transition from heparin drip to DOAC as outlined above prior to discharge  4. HTN: -Amlodipine  -HCTZ and losartan held secondary to the above  5.  Hypokalemia: -Repleted  6.  Anemia: -Likely of chronic disease -  Stable   For questions or updates, please contact [place] Please consult [email or web address] for contact info under Cardiology/STEMI.    Signed, Christell Faith, PA-C [facility] Pager: [phone] 01/03/2020, 8:33 AM

## 2020-01-03 NOTE — Evaluation (Signed)
Physical Therapy Evaluation Patient Details Name: Stephen Garrett MRN: [id number] DOB: 10-Dec-1923 Today's Date: 01/03/2020   History of Present Illness  Pt is a 86 y.o. male presenting to hospital 11/3 with low BP, syncopal episode, new a-fib, and recent fall; fatigue x1 week.  Pt admitted with NSTEMI, new onset a-fib, acute on chronic kidney disease, hypokalemia.  PMH includes htn, HDL, BPH, RLS, TIA, diverticulosis, cochlear implant R, hearing aid L, NSTEMI, diplopia, B TKR, L-spine surgery, ptosis repair.  Clinical Impression  Prior to hospital admission, pt was independent with ambulation and active; lives at [facility].  Pt very HOH so utilized pen and paper (and gestures as needed) for communication during session.  Orthostatics taken per nursing request (see flow sheet for details).  Currently pt is min to mod assist semi-supine to sitting edge of bed; min assist progressing to CGA with transfers; and CGA ambulating up to 110 feet (with 2nd assist for tele pole and IV pole and then 3rd assist for chair follow).  Trialed RW vs no AD with ambulation and improved steadiness and safety noted ambulating with RW (pt educated on recommendation to use RW for ambulation and pt agreeable).  HR 53-63 bpm at rest and increased up to 93 bpm with activity (decreased to 60-70's bpm at rest end of session).  Pt would benefit from skilled PT to address noted impairments and functional limitations (see below for any additional details).  Upon hospital discharge, pt would benefit from HHPT and RW use for ambulation.    Follow Up Recommendations Home health PT    Equipment Recommendations  Rolling walker with 5" wheels    Recommendations for Other Services OT consult     Precautions / Restrictions Precautions Precautions: Fall Restrictions Weight Bearing Restrictions: No      Mobility  Bed Mobility Overal bed mobility: Needs Assistance Bed Mobility: Supine to Sit     Supine to sit: Min assist;Mod  assist;HOB elevated     General bed mobility comments: assist for trunk semi-supine to sitting edge of bed    Transfers Overall transfer level: Needs assistance Equipment used: Rolling walker (2 wheeled) Transfers: Sit to/from Stand Sit to Stand: Min assist;Min guard         General transfer comment: min assist to stand from bed 1st trial and CGA to stand from bed 2nd trial (use of RW); CGA to stand from toilet with use of grab bar (up to RW)  Ambulation/Gait Ambulation/Gait assistance: Min guard;+2 safety/equipment (2nd assist for telemetry pole and IV pole; 3rd assist for chair follow) Gait Distance (Feet):  (20 feet to bathroom with RW; 110 feet (60 feet with RW and 50 feet with no AD)) Assistive device: Rolling walker (2 wheeled);None   Gait velocity: mildly decreased with RW and more decreased with no AD with ambulation   General Gait Details: pt initially with partial step through gait pattern using RW and appearing steady and safe with ambulation; trialed pt without walker (per pt request at beginning of session) and pt with shorter more shuffling steps and appearing mildly unsteady requiring CGA for safety  Stairs            Wheelchair Mobility    Modified Rankin (Stroke Patients Only)       Balance Overall balance assessment: Needs assistance Sitting-balance support: No upper extremity supported;Feet supported Sitting balance-Leahy Scale: Good Sitting balance - Comments: steady sitting reaching within BOS   Standing balance support: No upper extremity supported;During functional activity Standing balance-Leahy  Scale: Fair Standing balance comment: mildly unsteady ambulating without UE support                             Pertinent Vitals/Pain Pain Assessment: No/denies pain     Home Living Family/patient expects to be discharged to:: Other (Comment)                 Additional Comments: [place] (pt reports for 20 years)    Prior  Function Level of Independence: Independent         Comments: Pt reports playing tennis last week.  Does not use any DME for ambulation.  Active.     Hand Dominance        Extremity/Trunk Assessment   Upper Extremity Assessment Upper Extremity Assessment: Generalized weakness    Lower Extremity Assessment Lower Extremity Assessment: Generalized weakness    Cervical / Trunk Assessment Cervical / Trunk Assessment: Normal  Communication   Communication: HOH  Cognition Arousal/Alertness: Awake/alert Behavior During Therapy: WFL for tasks assessed/performed Overall Cognitive Status: Within Functional Limits for tasks assessed                                        General Comments   Nursing cleared pt for participation in physical therapy.  Pt agreeable to PT session but requesting to toilet during session.    Exercises  Transfers; gait training with RW; orthostatics   Assessment/Plan    PT Assessment Patient needs continued PT services  PT Problem List Decreased strength;Decreased activity tolerance;Decreased balance;Decreased mobility;Decreased knowledge of use of DME;Decreased knowledge of precautions       PT Treatment Interventions DME instruction;Gait training;Stair training;Functional mobility training;Therapeutic activities;Therapeutic exercise;Balance training;Patient/family education    PT Goals (Current goals can be found in the Care Plan section)  Acute Rehab PT Goals Patient Stated Goal: to go home soon PT Goal Formulation: With patient Time For Goal Achievement: 01/17/20 Potential to Achieve Goals: Good    Frequency Min 2X/week   Barriers to discharge        Co-evaluation PT/OT/SLP Co-Evaluation/Treatment: Yes Reason for Co-Treatment: For patient/therapist safety;To address functional/ADL transfers PT goals addressed during session: Mobility/safety with mobility;Balance;Proper use of DME OT goals addressed during session: ADL's  and self-care       AM-PAC PT "6 Clicks" Mobility  Outcome Measure Help needed turning from your back to your side while in a flat bed without using bedrails?: None Help needed moving from lying on your back to sitting on the side of a flat bed without using bedrails?: A Little Help needed moving to and from a bed to a chair (including a wheelchair)?: A Little Help needed standing up from a chair using your arms (e.g., wheelchair or bedside chair)?: A Little Help needed to walk in hospital room?: A Little Help needed climbing 3-5 steps with a railing? : A Lot 6 Click Score: 18    End of Session Equipment Utilized During Treatment: Gait belt Activity Tolerance: Patient tolerated treatment well Patient left: in chair (with OT present finishing session) Nurse Communication: Mobility status;Precautions PT Visit Diagnosis: Unsteadiness on feet (R26.81);Other abnormalities of gait and mobility (R26.89);Muscle weakness (generalized) (M62.81);History of falling (Z91.81);Difficulty in walking, not elsewhere classified (R26.2)    Time: 1350-1444 PT Time Calculation (min) (ACUTE ONLY): 54 min   Charges:   PT Evaluation $PT Eval Low  Complexity: 1 Low PT Treatments $Gait Training: 8-22 mins $Therapeutic Exercise: 8-22 mins $Therapeutic Activity: 8-22 mins       Leitha Bleak, PT 01/03/20, 3:34 PM

## 2020-01-03 NOTE — Progress Notes (Signed)
PROGRESS NOTE    Stephen Garrett  YQI:[id number] DOB: 1923-06-16 DOA: 01/01/2020 PCP: [name], MD     Brief Narrative:  Stephen Garrett is a 86 y.o. male with medical history significant of hypertension, decreased hearing, TIAs, restless leg syndrome, hyperlipidemia, BPH, GERD, status post cochlear implant and spinal stenosis who was here with his daughter presenting with presyncopal episodes and palpitations.  He lives in assisted living facility where he was evaluated.  Patient found to have new onset A. fib.  He was brought in where he was seen in the ER and evaluated.  He appears to have some mild EKG changes but significant increase in his troponins.  Patient said he fell yesterday while sitting playing bridge.  He did not hit his head.  He did not feel any chest pain. Today he repeated episode where he was noted to have passed out midsentence prior to coming in.  He is fully with it but has poor hearing despite cochlear implant.  He was admitted with non-ST elevation MI and new onset atrial fibrillation, AKI.   New events last 24 hours / Subjective: No physical complaints, wants to go home.  Denies any chest pain.  Has some bleeding from his IV that was removed this morning.  Assessment & Plan:   Principal Problem:   NSTEMI (non-ST elevated myocardial infarction) ([place]) Active Problems:   Essential hypertension, benign   GERD   Chronic venous insufficiency   Acute renal failure superimposed on stage 3 chronic kidney disease (HCC)   Hypokalemia   A-fib (HCC)   NSTEMI -Troponin peak at 385 -Echocardiogram showed EF 55 to 60%, no regional wall motion abnormality, moderate to severe LVH -Secondary to demand ischemia in setting of A. fib and AKI -Given lack of anginal symptoms, no plans for inpatient ischemic evaluation -IV heparin to be transitioned to [place] per cardiology  New onset atrial fibrillation -CHA2DS2-VASc 3 -IV heparin to be transitioned to [place] per  cardiology -Lopressor held due to bradycardia.  Patient remains asymptomatic from A. fib at this point  AKI on CKD stage IIIb -Admitted with creatinine 3.85, improved to 3.03 -Baseline creatinine 1.4 -Hold HCTZ, Cozaar. Avoid nephrotoxins  -IVF  -Continues to improve, monitor  HTN  -Continue Norvasc -Hold HCTZ, Cozaar due to AKI  Depression/anxiety  -Continue Wellbutrin, Klonopin  BPH  -Continue Proscar, Flomax  GERD -Continue PPI     DVT prophylaxis: IV heparin   Code Status: Full code Family Communication: No family at bedside; spoke with daughter over the phone today  Disposition Plan:  Status is: Inpatient  Remains inpatient appropriate because:IV treatments appropriate due to intensity of illness or inability to take PO and Inpatient level of care appropriate due to severity of illness   Dispo: The patient is from: Independent living facility              Anticipated d/c is to: Independent living facility vs SNF.               Anticipated d/c date is: 1 day              Patient currently is not medically stable to d/c.  Remains on IV heparin. IVF for AKI. Need PT OT eval.    Consultants:   Cardiology  Procedures:   None  Antimicrobials:  Anti-infectives (From admission, onward)   None       Objective: Vitals:   01/02/20 1928 01/03/20 0432 01/03/20 0758 01/03/20 1152  BP: 122/77 Marland Kitchen)  118/54 (!) 145/83 122/75  Pulse: (!) 53 (!) 58 69 60  Resp: 20 (!) 22 (!) 21 20  Temp: 97.6 F (36.4 C) 97.7 F (36.5 C) 98.4 F (36.9 C) 97.7 F (36.5 C)  TempSrc: Oral Oral  Oral  SpO2: 97% 94% 97% 97%  Weight:      Height:        Intake/Output Summary (Last 24 hours) at 01/03/2020 1213 Last data filed at 01/03/2020 1028 Gross per 24 hour  Intake 480 ml  Output --  Net 480 ml   Filed Weights   01/01/20 1451 01/02/20 0300  Weight: 78 kg 81.1 kg    Examination: General exam: Appears calm and comfortable, hard of hearing Respiratory system: Clear to  auscultation. Respiratory effort normal. Cardiovascular system: S1 & S2 heard, irregular rhythm, rate 60s. No pedal edema. Gastrointestinal system: Abdomen is nondistended, soft and nontender. Normal bowel sounds heard. Central nervous system: Alert and oriented. Non focal exam. Speech clear  Extremities: Symmetric in appearance bilaterally  Skin: No rashes, lesions or ulcers on exposed skin  Psychiatry: Judgement and insight appear stable. Mood & affect appropriate.    Data Reviewed: I have personally reviewed following labs and imaging studies  CBC: Recent Labs  Lab 01/01/20 1455 01/02/20 0637 01/03/20 0350  WBC 8.9 7.4 7.4  HGB 11.2* 11.7* 11.5*  HCT 33.4* 34.7* 34.3*  MCV 90.8 90.4 91.7  PLT 167 166 086   Basic Metabolic Panel: Recent Labs  Lab 01/01/20 1455 01/01/20 1804 01/02/20 0753 01/03/20 0350  NA 134*  --  136 135  K 3.3*  --  3.2* 4.1  CL 98  --  102 104  CO2 23  --  23 19*  GLUCOSE 118*  --  130* 99  BUN 71*  --  67* 64*  CREATININE 3.85*  --  3.03* 2.46*  CALCIUM 8.9  --  8.5* 8.9  MG  --  2.1  --  2.2   GFR: Estimated Creatinine Clearance: 18.4 mL/min (A) (by C-G formula based on SCr of 2.46 mg/dL (H)). Liver Function Tests: Recent Labs  Lab 01/01/20 1804  AST 29  ALT 21  ALKPHOS 56  BILITOT 1.2  PROT 6.0*  ALBUMIN 3.5   No results for input(s): LIPASE, AMYLASE in the last 168 hours. No results for input(s): AMMONIA in the last 168 hours. Coagulation Profile: Recent Labs  Lab 01/01/20 2030  INR 1.1   Cardiac Enzymes: No results for input(s): CKTOTAL, CKMB, CKMBINDEX, TROPONINI in the last 168 hours. BNP (last 3 results) No results for input(s): PROBNP in the last 8760 hours. HbA1C: No results for input(s): HGBA1C in the last 72 hours. CBG: No results for input(s): GLUCAP in the last 168 hours. Lipid Profile: Recent Labs    01/01/20 2252  CHOL 199  HDL 37*  LDLCALC 148*  TRIG 70  CHOLHDL 5.4   Thyroid Function Tests: Recent  Labs    01/01/20 1804  TSH 3.231   Anemia Panel: No results for input(s): VITAMINB12, FOLATE, FERRITIN, TIBC, IRON, RETICCTPCT in the last 72 hours. Sepsis Labs: No results for input(s): PROCALCITON, LATICACIDVEN in the last 168 hours.  Recent Results (from the past 240 hour(s))  Respiratory Panel by RT PCR (Flu A&B, Covid) - Nasopharyngeal Swab     Status: None   Collection Time: 01/01/20  7:07 PM   Specimen: Nasopharyngeal Swab  Result Value Ref Range Status   SARS Coronavirus 2 by RT PCR NEGATIVE NEGATIVE Final  Comment: (NOTE) SARS-CoV-2 target nucleic acids are NOT DETECTED.  The SARS-CoV-2 RNA is generally detectable in upper respiratoy specimens during the acute phase of infection. The lowest concentration of SARS-CoV-2 viral copies this assay can detect is 131 copies/mL. A negative result does not preclude SARS-Cov-2 infection and should not be used as the sole basis for treatment or other patient management decisions. A negative result may occur with  improper specimen collection/handling, submission of specimen other than nasopharyngeal swab, presence of viral mutation(s) within the areas targeted by this assay, and inadequate number of viral copies (<131 copies/mL). A negative result must be combined with clinical observations, patient history, and epidemiological information. The expected result is Negative.  Fact Sheet for Patients:  PinkCheek.be  Fact Sheet for Healthcare Providers:  GravelBags.it  This test is no t yet approved or cleared by the [place] FDA and  has been authorized for detection and/or diagnosis of SARS-CoV-2 by FDA under an Emergency Use Authorization (EUA). This EUA will remain  in effect (meaning this test can be used) for the duration of the COVID-19 declaration under Section 564(b)(1) of the Act, 21 U.S.C. section 360bbb-3(b)(1), unless the authorization is terminated  or revoked sooner.     Influenza A by PCR NEGATIVE NEGATIVE Final   Influenza B by PCR NEGATIVE NEGATIVE Final    Comment: (NOTE) The Xpert Xpress SARS-CoV-2/FLU/RSV assay is intended as an aid in  the diagnosis of influenza from Nasopharyngeal swab specimens and  should not be used as a sole basis for treatment. Nasal washings and  aspirates are unacceptable for Xpert Xpress SARS-CoV-2/FLU/RSV  testing.  Fact Sheet for Patients: PinkCheek.be  Fact Sheet for Healthcare Providers: GravelBags.it  This test is not yet approved or cleared by the [place] FDA and  has been authorized for detection and/or diagnosis of SARS-CoV-2 by  FDA under an Emergency Use Authorization (EUA). This EUA will remain  in effect (meaning this test can be used) for the duration of the  Covid-19 declaration under Section 564(b)(1) of the Act, 21  U.S.C. section 360bbb-3(b)(1), unless the authorization is  terminated or revoked. Performed at [facility], [address], [place], [place] [address]       Radiology Studies: DG Chest 2 View  Result Date: 01/01/2020 CLINICAL DATA:  AFib. EXAM: CHEST - 2 VIEW COMPARISON:  11/01/2017. FINDINGS: Enlarged cardiac silhouette. Aortic atherosclerosis. Left basilar opacities. No visible pleural effusions or pneumothorax. No acute osseous abnormality. IMPRESSION: 1. Left basilar opacities, which may represent atelectasis, aspiration, and/or pneumonia. 2. Cardiomegaly. Electronically Signed   By: Margaretha Sheffield MD   On: 01/01/2020 15:26   CT Head Wo Contrast  Result Date: 01/01/2020 CLINICAL DATA:  Weakness EXAM: CT HEAD WITHOUT CONTRAST TECHNIQUE: Contiguous axial images were obtained from the base of the skull through the vertex without intravenous contrast. COMPARISON:  09/17/2017 FINDINGS: Brain: No evidence of acute infarction, hemorrhage, hydrocephalus, extra-axial collection or mass  lesion/mass effect. Chronic atrophic and ischemic changes are noted. Vascular: No hyperdense vessel or unexpected calcification. Skull: Normal. Negative for fracture or focal lesion. Sinuses/Orbits: No acute finding. Other: Postsurgical changes are noted on the right consistent with cochlear implant. IMPRESSION: Chronic atrophic and ischemic changes. Postsurgical changes without acute abnormality. Electronically Signed   By: Inez Catalina M.D.   On: 01/01/2020 18:59   US Renal  Result Date: 01/01/2020 CLINICAL DATA:  Acute renal injury EXAM: RENAL / URINARY TRACT ULTRASOUND COMPLETE COMPARISON:  None. FINDINGS: Right Kidney: Renal measurements: 10.7  x 4.2 x 5.3 cm. = volume: 123 mL. Cysts are noted within the right kidney. The largest of these measures 3.6 cm in greatest dimension in the midportion of the right kidney. Smaller cysts are noted as well. Overall less than 10 cysts are seen. No mass lesion or hydronephrosis is seen. Left Kidney: Renal measurements: 9.6 x 4.4 x 5.7 cm. = volume: 128 mL. 2.5 cm cyst is noted within the midportion of the left kidney. No mass lesion or hydronephrosis is noted. Bladder: Appears normal for degree of bladder distention. Other: None. IMPRESSION: Bilateral simple renal cysts.  No acute abnormality noted. Electronically Signed   By: Inez Catalina M.D.   On: 01/01/2020 18:57   ECHOCARDIOGRAM COMPLETE  Result Date: 01/02/2020    ECHOCARDIOGRAM REPORT   Patient Name:   Stephen Garrett Date of Exam: 01/01/2020 Medical Rec #:  [id number]       Height:       66.0 in Accession #:    [id number]      Weight:       172.0 lb Date of Birth:  07/16/1923        BSA:          1.876 m Patient Age:    20 years        BP:           142/69 mmHg Patient Gender: M               HR:           63 bpm. Exam Location:  [facility] Procedure: 2D Echo, Cardiac Doppler and Color Doppler Indications:     R55 Syncope  History:         Patient has prior history of Echocardiogram examinations, most                   recent 09/15/2017. Risk Factors:Hypertension and Dyslipidemia.                  Murmur. Renal Insufficiency.  Sonographer:     Wilford Sports Rodgers-Jones Referring Phys:  [id number] [place] Diagnosing Phys: Ida Rogue MD IMPRESSIONS  1. Left ventricular ejection fraction, by estimation, is 55 to 60%. The left ventricle has normal function. The left ventricle has no regional wall motion abnormalities. There is moderate to severe left ventricular hypertrophy.  2. Right ventricular systolic function is normal. The right ventricular size is normal. There is mildly elevated pulmonary artery systolic pressure. The estimated right ventricular systolic pressure is 93.7 mmHg.  3. Left atrial size was severely dilated.  4. Mild mitral valve regurgitation.  5. The aortic valve is heavily calcified. Mild aortic valve stenosis.  6. The inferior vena cava is normal in size with <50% respiratory variability, suggesting right atrial pressure of 8 mmHg.  7. Rhythm is atrial fibrillation FINDINGS  Left Ventricle: Left ventricular ejection fraction, by estimation, is 55 to 60%. The left ventricle has normal function. The left ventricle has no regional wall motion abnormalities. The left ventricular internal cavity size was normal in size. There is  moderate to severe left ventricular hypertrophy. Left ventricular diastolic parameters are indeterminate. Right Ventricle: The right ventricular size is normal. No increase in right ventricular wall thickness. Right ventricular systolic function is normal. There is mildly elevated pulmonary artery systolic pressure. The tricuspid regurgitant velocity is 2.74  m/s, and with an assumed right atrial pressure of 10 mmHg, the estimated right ventricular systolic pressure is 16.9 mmHg. Left Atrium:  Left atrial size was severely dilated. Right Atrium: Right atrial size was normal in size. Pericardium: There is no evidence of pericardial effusion. Mitral Valve: The mitral valve is normal in  structure. There is moderate calcification of the mitral valve leaflet(s). Mild mitral annular calcification. Mild mitral valve regurgitation. No evidence of mitral valve stenosis. Tricuspid Valve: The tricuspid valve is normal in structure. Tricuspid valve regurgitation is mild . No evidence of tricuspid stenosis. Aortic Valve: The aortic valve is normal in structure. Aortic valve regurgitation is not visualized. Mild aortic stenosis is present. Aortic valve mean gradient measures 9.6 mmHg. Aortic valve peak gradient measures 16.7 mmHg. Aortic valve area, by VTI measures 2.00 cm. Pulmonic Valve: The pulmonic valve was normal in structure. Pulmonic valve regurgitation is mild. No evidence of pulmonic stenosis. Aorta: The aortic root is normal in size and structure. Venous: The inferior vena cava is normal in size with less than 50% respiratory variability, suggesting right atrial pressure of 8 mmHg. IAS/Shunts: No atrial level shunt detected by color flow Doppler.  LEFT VENTRICLE PLAX 2D LVIDd:         3.96 cm LVIDs:         2.65 cm LV PW:         1.44 cm LV IVS:        1.50 cm LVOT diam:     2.10 cm LV SV:         64 LV SV Index:   34 LVOT Area:     3.46 cm  RIGHT VENTRICLE             IVC RV Basal diam:  4.33 cm     IVC diam: 2.01 cm RV S prime:     15.10 cm/s LEFT ATRIUM              Index       RIGHT ATRIUM           Index LA diam:        4.60 cm  2.45 cm/m  RA Area:     27.30 cm LA Vol (A2C):   130.0 ml 69.31 ml/m RA Volume:   102.00 ml 54.38 ml/m LA Vol (A4C):   101.0 ml 53.85 ml/m LA Biplane Vol: 120.0 ml 63.98 ml/m  AORTIC VALVE AV Area (Vmax):    1.66 cm AV Area (Vmean):   1.83 cm AV Area (VTI):     2.00 cm AV Vmax:           204.40 cm/s AV Vmean:          145.000 cm/s AV VTI:            0.319 m AV Peak Grad:      16.7 mmHg AV Mean Grad:      9.6 mmHg LVOT Vmax:         98.00 cm/s LVOT Vmean:        76.633 cm/s LVOT VTI:          0.184 m LVOT/AV VTI ratio: 0.58  AORTA Ao Root diam: 3.60 cm MV E  velocity: 137.00 cm/s  TRICUSPID VALVE                             TR Peak grad:   30.0 mmHg  TR Vmax:        274.00 cm/s                              SHUNTS                             Systemic VTI:  0.18 m                             Systemic Diam: 2.10 cm Ida Rogue MD Electronically signed by Ida Rogue MD Signature Date/Time: 01/02/2020/8:42:54 AM    Final       Scheduled Meds: . amLODipine  5 mg Oral Daily  . buPROPion  150 mg Oral Daily  . clonazePAM  0.5 mg Oral QHS  . finasteride  5 mg Oral Daily  . pantoprazole  80 mg Oral Daily  . pramipexole  1 mg Oral BID  . tamsulosin  0.4 mg Oral Daily   Continuous Infusions: . sodium chloride 75 mL/hr at 01/02/20 1126  . heparin 1,150 Units/hr (01/03/20 1203)     LOS: 2 days      Time spent: 35 minutes   Dessa Phi, DO [facility] 01/03/2020, 12:13 PM   Available via Epic secure chat 7am-7pm After these hours, please refer to coverage provider listed on [email or web address]

## 2020-01-03 NOTE — Progress Notes (Signed)
[place] for heparin Indication: chest pain/ACS and atrial fibrillation  Allergies  Allergen Reactions  . Atorvastatin     REACTION: raises blood pressure  . Pravastatin Sodium     REACTION: raises blood pressure  . Statins     REACTION: raises bp    Patient Measurements: Height: 5\' 6"  (167.6 cm) Weight: 81.1 kg (178 lb 14.4 oz) IBW/kg (Calculated) : 63.8 Heparin Dosing Weight: 78 kg  Vital Signs: Temp: 97.7 F (36.5 C) (11/05 0432) Temp Source: Oral (11/05 0432) BP: 118/54 (11/05 0432) Pulse Rate: 58 (11/05 0432)  Labs: Recent Labs     0000 01/01/20 1455 01/01/20 1804 01/01/20 1936 01/01/20 2030 01/01/20 2252 01/02/20 0405 01/02/20 0637 01/02/20 0753 01/02/20 1357 01/02/20 2300 01/03/20 0350  HGB   < > 11.2*  --   --   --   --   --  11.7*  --   --   --  11.5*  HCT  --  33.4*  --   --   --   --   --  34.7*  --   --   --  34.3*  PLT  --  167  --   --   --   --   --  166  --   --   --  166  APTT  --   --   --   --  >160*  --   --   --   --   --   --   --   LABPROT  --   --   --   --  13.7  --   --   --   --   --   --   --   INR  --   --   --   --  1.1  --   --   --   --   --   --   --   HEPARINUNFRC  --   --   --   --   --   --    < >  --   --  0.17* 0.62 0.69  CREATININE  --  3.85*  --   --   --   --   --   --  3.03*  --   --  2.46*  TROPONINIHS  --   --    < > 336*  --  333*  --   --  241*  --   --   --    < > = values in this interval not displayed.    Estimated Creatinine Clearance: 18.4 mL/min (A) (by C-G formula based on SCr of 2.46 mg/dL (H)).   Medical History: Past Medical History:  Diagnosis Date  . Actinic keratosis   . BPH (benign prostatic hypertrophy)   . Diseases of lips   . Diverticulosis of colon 06/2003  . ED (erectile dysfunction)   . GERD (gastroesophageal reflux disease)   . Heart murmur    as child  . HLD (hyperlipidemia)   . HOH (hard of hearing)    Cochlear implant right, hearing aid left   . HTN (hypertension)   . Numbness and tingling in both hands    intermittent, trying braces   . Osteoarthritis of knee    severe, bilat  . Renal insufficiency   . Restless legs syndrome (RLS)   . Sleep disturbance, unspecified   . Spinal stenosis, lumbar region, without neurogenic claudication  Assessment: 86 year old male who presents following syncopal episode. States he was told by EMS that he has new afib. No anticoagulation listed PTA. Pharmacy consult for heparin for ACS/STEMI.   11/4 0405 HL 0.21  11/4 1357 HL 0.17  11/4 2300 HL 0.62 11/5 0350 HL 0.69   Goal of Therapy:  Heparin level 0.3-0.7 units/ml Monitor platelets by anticoagulation protocol: Yes   Plan:  Heparin level is therapeutic.  Will continue heparin infusion at 1250 units/hr. Recheck heparin level and CBC with AM labs.   [name], PharmD 01/03/2020,7:23 AM

## 2020-01-03 NOTE — Evaluation (Signed)
Occupational Therapy Evaluation Patient Details Name: Stephen Garrett MRN: [id number] DOB: [birth year] Today's Date: 01/03/2020    History of Present Illness Pt is a 86 y.o. male presenting to hospital 11/3 with low BP, syncopal episode, new a-fib, and recent fall; fatigue x1 week.  Pt admitted with NSTEMI, new onset a-fib, acute on chronic kidney disease, hypokalemia.  PMH includes htn, HDL, BPH, RLS, TIA, diverticulosis, cochlear implant R, hearing aid L, NSTEMI, diplopia, B TKR, L-spine surgery, ptosis repair.   Clinical Impression   Stephen Garrett was seen for OT/PT co-evaluation this date. Pt received seated on room commode with PT already in room to begin session. Pt provides limited Information regarding PLOF/home set-up 2/2 impaired hearing and malfunctioning cochlear implants. Information primarily obtained through chart review. Attempted to call daughter at number listed on file, however received no answer this date. Per chart/pt he is a resident of [facility]. Pt reports he was active and independent prior to admission and endorses "playing tennis last week". He does not use AE for functional mobility. Currently pt demonstrates impairments as described below (See OT problem list) which functionally limit *his/her ability to perform ADL/self-care tasks. Pt currently requires CGA for functional mobility, close supervision for toileting and peri-care. Anticipate he will require MIN A for LB ADL management.  Pt would benefit from skilled OT services to address noted impairments and functional limitations (see below for any additional details) in order to maximize safety and independence while minimizing falls risk and caregiver burden. Upon hospital discharge, recommend HHOT to maximize pt safety and return to functional independence during meaningful occupations of daily life.     Follow Up Recommendations  Home health OT    Equipment Recommendations  3 in 1 bedside commode (Rolling walker.)     Recommendations for Other Services       Precautions / Restrictions Precautions Precautions: Fall Restrictions Weight Bearing Restrictions: No      Mobility Bed Mobility Overal bed mobility: Needs Assistance Bed Mobility: Supine to Sit     Supine to sit: Min assist;Mod assist;HOB elevated     General bed mobility comments: Deferred. Pt OOB at start/end of session. Per PT MIN-MOD A for sup>sit.    Transfers Overall transfer level: Needs assistance Equipment used: Rolling walker (2 wheeled) Transfers: Sit to/from Stand Sit to Stand: Min assist;Min guard         General transfer comment: CGA for STS from room commode with use of grab bar. Requires close supervision as well as +2 assist for chair follow and mgt of lines/leads during functional mobility this date.    Balance Overall balance assessment: Needs assistance Sitting-balance support: No upper extremity supported;Feet supported Sitting balance-Leahy Scale: Good Sitting balance - Comments: Steady static/dynamic sitting. no LOB appreciated with weight shift for peri-care this date.   Standing balance support: No upper extremity supported;During functional activity Standing balance-Leahy Scale: Fair Standing balance comment: mildly unsteady ambulating without UE support                           ADL either performed or assessed with clinical judgement   ADL Overall ADL's : Needs assistance/impaired                                       General ADL Comments: Pt functionally limited by cardiopulmonary status generalized weakness, decreased activity tolerance, and  decreased safety awareness. He requires close supervision for safety during toileting and CGA for toilet transfer using a RW.     Vision         Perception     Praxis      Pertinent Vitals/Pain Pain Assessment: No/denies pain     Hand Dominance Left   Extremity/Trunk Assessment Upper Extremity Assessment Upper  Extremity Assessment: Generalized weakness   Lower Extremity Assessment Lower Extremity Assessment: Generalized weakness   Cervical / Trunk Assessment Cervical / Trunk Assessment: Normal   Communication Communication Communication: HOH   Cognition Arousal/Alertness: Awake/alert Behavior During Therapy: WFL for tasks assessed/performed Overall Cognitive Status: Within Functional Limits for tasks assessed                                 General Comments: Difficult to assess 2/2 impaired hearing. Pt noted to have cochlear implants, however, per nsg and pt they are not currently working. He appears generally oriented to follows written/gestural prompts consistently t/o session.   General Comments  Vitals monitored t/o session. HR remains gnerally in 70's-80s with mobility with one instance of HR reaching 93 after amb. Returns to 60's at rest with lowest reading noted on room monitor during session being 53 bpm. SpO2 is >/= 93% t/o session.    Exercises Other Exercises Other Exercises: Attempted education on safe transfer techniques and falls prevention. Pt education limited due to impaired hearing. Will continue to address at future session.   Shoulder Instructions      Home Living Family/patient expects to be discharged to:: Other (Comment)                                 Additional Comments: [facility] (pt reports for 20 years)      Prior Functioning/Environment Level of Independence: Independent        Comments: Pt reports playing tennis last week.  Does not use any DME for ambulation.  Active.        OT Problem List: Decreased strength;Decreased coordination;Cardiopulmonary status limiting activity;Decreased activity tolerance;Decreased safety awareness;Impaired balance (sitting and/or standing);Decreased knowledge of use of DME or AE      OT Treatment/Interventions: Self-care/ADL training;Therapeutic exercise;Therapeutic activities;DME and/or  AE instruction;Patient/family education;Balance training;Energy conservation    OT Goals(Current goals can be found in the care plan section) Acute Rehab OT Goals Patient Stated Goal: to go home soon OT Goal Formulation: With patient Time For Goal Achievement: 01/17/20 Potential to Achieve Goals: Good ADL Goals Pt Will Perform Grooming: with modified independence;standing;sitting (c LRAD PRN for improved safety and functional indep.) Pt Will Perform Lower Body Dressing: sit to/from stand;with modified independence (c LRAD PRN for improved safety and functional indep.) Pt Will Transfer to Toilet: bedside commode;with modified independence;ambulating (c LRAD PRN for improved safety and functional indep.) Pt Will Perform Toileting - Clothing Manipulation and hygiene: sit to/from stand;with modified independence (c LRAD PRN for improved safety and functional indep.)  OT Frequency: Min 1X/week   Barriers to D/C:            Co-evaluation PT/OT/SLP Co-Evaluation/Treatment: Yes Reason for Co-Treatment: For patient/therapist safety;To address functional/ADL transfers PT goals addressed during session: Mobility/safety with mobility;Balance;Proper use of DME OT goals addressed during session: ADL's and self-care      AM-PAC OT "6 Clicks" Daily Activity     Outcome Measure Help from another person  eating meals?: None Help from another person taking care of personal grooming?: A Little Help from another person toileting, which includes using toliet, bedpan, or urinal?: A Little Help from another person bathing (including washing, rinsing, drying)?: A Little Help from another person to put on and taking off regular upper body clothing?: A Little Help from another person to put on and taking off regular lower body clothing?: A Little 6 Click Score: 19   End of Session Equipment Utilized During Treatment: Gait belt;Rolling walker  Activity Tolerance: Patient tolerated treatment well Patient  left: Other (comment) (In room bathroom on commode with NT; call bell in reach.)  OT Visit Diagnosis: Other abnormalities of gait and mobility (R26.89)                Time: 5056-9794 OT Time Calculation (min): 23 min Charges:  OT General Charges $OT Visit: 1 Visit OT Evaluation $OT Eval Moderate Complexity: 1 Mod  Shara Blazing, M.S., OTR/L Ascom: [phone] 01/03/20, 3:50 PM

## 2020-01-03 NOTE — Progress Notes (Addendum)
[facility] for heparin Indication: chest pain/ACS and atrial fibrillation  Allergies  Allergen Reactions  . Atorvastatin     REACTION: raises blood pressure  . Pravastatin Sodium     REACTION: raises blood pressure  . Statins     REACTION: raises bp    Patient Measurements: Height: 5\' 6"  (167.6 cm) Weight: 81.1 kg (178 lb 14.4 oz) IBW/kg (Calculated) : 63.8 Heparin Dosing Weight: 78 kg  Vital Signs: Temp: 98.4 F (36.9 C) (11/05 0758) Temp Source: Oral (11/05 0432) BP: 145/83 (11/05 0758) Pulse Rate: 69 (11/05 0758)  Labs: Recent Labs     0000 01/01/20 1455 01/01/20 1804 01/01/20 1936 01/01/20 2030 01/01/20 2252 01/02/20 0405 01/02/20 0637 01/02/20 0753 01/02/20 1357 01/02/20 2300 01/03/20 0350  HGB   < > 11.2*  --   --   --   --   --  11.7*  --   --   --  11.5*  HCT  --  33.4*  --   --   --   --   --  34.7*  --   --   --  34.3*  PLT  --  167  --   --   --   --   --  166  --   --   --  166  APTT  --   --   --   --  >160*  --   --   --   --   --   --   --   LABPROT  --   --   --   --  13.7  --   --   --   --   --   --   --   INR  --   --   --   --  1.1  --   --   --   --   --   --   --   HEPARINUNFRC  --   --   --   --   --   --    < >  --   --  0.17* 0.62 0.69  CREATININE  --  3.85*  --   --   --   --   --   --  3.03*  --   --  2.46*  TROPONINIHS  --   --    < > 336*  --  333*  --   --  241*  --   --   --    < > = values in this interval not displayed.    Estimated Creatinine Clearance: 18.4 mL/min (A) (by C-G formula based on SCr of 2.46 mg/dL (H)).   Medical History: Past Medical History:  Diagnosis Date  . Actinic keratosis   . BPH (benign prostatic hypertrophy)   . Diseases of lips   . Diverticulosis of colon 06/2003  . ED (erectile dysfunction)   . GERD (gastroesophageal reflux disease)   . Heart murmur    as child  . HLD (hyperlipidemia)   . HOH (hard of hearing)    Cochlear implant right, hearing aid left   . HTN (hypertension)   . Numbness and tingling in both hands    intermittent, trying braces   . Osteoarthritis of knee    severe, bilat  . Renal insufficiency   . Restless legs syndrome (RLS)   . Sleep disturbance, unspecified   . Spinal stenosis, lumbar region, without neurogenic claudication  Assessment: 86 year old male who presents following syncopal episode. States he was told by EMS that he has new afib. No anticoagulation listed PTA. Pharmacy consult for heparin for ACS/STEMI.   11/4 0405 HL 0.21  11/4 1357 HL 0.17  11/4 2300 HL 0.62 11/5 0350 HL 0.69   Goal of Therapy:  Heparin level 0.3-0.7 units/ml Monitor platelets by anticoagulation protocol: Yes   Plan:  Heparin level is therapeutic, but pt is having some bleeding. MD aware of bleeding per RN. Will refuse the heparin infusion to 1150 units/hr as heparin level is on the upper limit of normal. Recheck heparin level in 6 hours. If therapeutic can switch to daily levels and CBC with AM labs.   [name], PharmD, BCPS 01/03/2020,11:23 AM

## 2020-01-03 NOTE — Progress Notes (Signed)
Mobility Specialist - Progress Note   01/03/20 1600  Mobility  Activity Stood at bedside  Range of Motion/Exercises Right leg;Left leg (hip abduction, knee ext, standing march)  Level of Assistance Minimal assist, patient does 75% or more  Assistive Device None (2 hand assist)  Distance Ambulated (ft) 0 ft  Mobility Response Tolerated well  Mobility performed by Mobility specialist  $Mobility charge 1 Mobility    Pre-mobility: 63 HR, 92% SpO2 Post-mobility: 70 HR, 96% SpO2   Pt was lying in bed upon arrival. Pt agreed to session. Pt has difficulty hearing this date d/t dead battery on hearing aids. Mobility tech used non-verbal technique (written and gestures) for effective communication. Pt denied pain, nausea, or fatigue. Pt was able to get EOB with minA. Pt stood with only 2-hand assist, to perform exercises: hip abduction, knee extension, and march. No LOB noted. Noted some heavy breathing, pt c/o slight SOB after session. O2 maintained > 92% throughout session and sat up with light activity. Pt was able to transfer EOB-supine with minA for repositioning. Overall, pt tolerated session well. Pt was left in bed with all needs in reach and alarm set.    Kathee Delton Mobility Specialist 01/03/20, 4:19 PM

## 2020-01-04 DIAGNOSIS — I4819 Other persistent atrial fibrillation: Secondary | ICD-10-CM

## 2020-01-04 LAB — CBC
HCT: 34.6 % — ABNORMAL LOW (ref 39.0–52.0)
Hemoglobin: 11 g/dL — ABNORMAL LOW (ref 13.0–17.0)
MCH: 29.8 pg (ref 26.0–34.0)
MCHC: 31.8 g/dL (ref 30.0–36.0)
MCV: 93.8 fL (ref 80.0–100.0)
Platelets: 146 10*3/uL — ABNORMAL LOW (ref 150–400)
RBC: 3.69 MIL/uL — ABNORMAL LOW (ref 4.22–5.81)
RDW: 14.2 % (ref 11.5–15.5)
WBC: 7 10*3/uL (ref 4.0–10.5)
nRBC: 0 % (ref 0.0–0.2)

## 2020-01-04 LAB — BASIC METABOLIC PANEL
Anion gap: 11 (ref 5–15)
BUN: 56 mg/dL — ABNORMAL HIGH (ref 8–23)
CO2: 21 mmol/L — ABNORMAL LOW (ref 22–32)
Calcium: 9.2 mg/dL (ref 8.9–10.3)
Chloride: 104 mmol/L (ref 98–111)
Creatinine, Ser: 2.05 mg/dL — ABNORMAL HIGH (ref 0.61–1.24)
GFR, Estimated: 29 mL/min — ABNORMAL LOW (ref >60)
Glucose, Bld: 103 mg/dL — ABNORMAL HIGH (ref 70–99)
Potassium: 4.6 mmol/L (ref 3.5–5.1)
Sodium: 136 mmol/L (ref 135–145)

## 2020-01-04 MED ORDER — AMLODIPINE BESYLATE 10 MG PO TABS
10.0000 mg | ORAL_TABLET | Freq: Every day | ORAL | Status: DC
  Administered 2020-01-04 – 2020-01-05 (×2): 10 mg via ORAL
  Filled 2020-01-04 (×2): qty 1

## 2020-01-04 NOTE — Progress Notes (Addendum)
PROGRESS NOTE    Stephen Garrett  NGE:[id number] DOB: 1923/06/22 DOA: 01/01/2020 PCP: [name], MD     Brief Narrative:  Stephen Garrett is a 86 y.o. male with medical history significant of hypertension, decreased hearing, TIAs, restless leg syndrome, hyperlipidemia, BPH, GERD, status post cochlear implant and spinal stenosis who was here with his daughter presenting with presyncopal episodes and palpitations.  He lives in assisted living facility where he was evaluated.  Patient found to have new onset A. fib.  He was brought in where he was seen in the ER and evaluated.  He appears to have some mild EKG changes but significant increase in his troponins.  Patient said he fell yesterday while sitting playing bridge.  He did not hit his head.  He did not feel any chest pain. Today he repeated episode where he was noted to have passed out midsentence prior to coming in.  He is fully with it but has poor hearing despite cochlear implant.  He was admitted with non-ST elevation MI and new onset atrial fibrillation, AKI.   New events last 24 hours / Subjective: Apparently fell out of his recliner chair this morning.  He states that the legs of the recliner were up, and when he tried to get up he slid off the chair.  He denies hitting his head or losing consciousness.  This fall was unwitnessed, no sign of injury after fall.  He has no other physical complaints.  Very adamant about leaving the hospital today.  I discussed with him multiple times that I do not feel he is clinically ready for discharge home.  He currently lives at home alone in an independent living facility apartment.  He is recommended for home health.  He is at high risk for falls, and with newly being on a blood thinner, I would like to monitor him in the hospital another day.  I discussed with him as well as his daughter over the phone.  Assessment & Plan:   Principal Problem:   NSTEMI (non-ST elevated myocardial infarction)  ([place]) Active Problems:   Essential hypertension, benign   GERD   Chronic venous insufficiency   Acute renal failure superimposed on stage 3 chronic kidney disease (HCC)   Hypokalemia   A-fib (HCC)   NSTEMI -Troponin peak at 385 -Echocardiogram showed EF 55 to 60%, no regional wall motion abnormality, moderate to severe LVH -Secondary to demand ischemia in setting of A. fib and AKI -Given lack of anginal symptoms, no plans for inpatient ischemic evaluation -Continue Eliquis  New onset atrial fibrillation -CHA2DS2-VASc 3 -Continue Eliquis -Lopressor held due to bradycardia.  Patient remains asymptomatic from A. fib at this point  AKI on CKD stage IIIb -Admitted with creatinine 3.85, improved to 3.03 -Baseline creatinine 1.4 -Hold HCTZ, Cozaar. Avoid nephrotoxins  -IVF  -Continues to improve, monitor  HTN  -Continue Norvasc -Hold HCTZ, Cozaar due to AKI -Orthostatic VS negative   Depression/anxiety  -Continue Wellbutrin, Klonopin  BPH  -Continue Proscar, Flomax  GERD -Continue PPI     DVT prophylaxis: Eliquis   Code Status: Full code Family Communication: No family at bedside; spoke with daughter over the phone today 11/6 Disposition Plan:  Status is: Inpatient  Remains inpatient appropriate because:Unsafe d/c plan   Dispo: The patient is from: Independent living facility              Anticipated d/c is to: Independent living facility  Anticipated d/c date is: 1 day              Patient currently is not medically stable to d/c.  Monitor in the hospital 1 more day to ensure safety for return to home setting.   Consultants:   Cardiology  Procedures:   None  Antimicrobials:  Anti-infectives (From admission, onward)   None       Objective: Vitals:   01/04/20 0729 01/04/20 0752 01/04/20 0754 01/04/20 0758  BP: (!) 152/83 (!) 159/76 (!) 152/96 (!) 153/74  Pulse: 66 (!) 47 (!) 56 (!) 57  Resp: (!) 22 18 18    Temp: 97.6 F (36.4 C)  97.7 F (36.5 C)    TempSrc: Oral Oral    SpO2: 99% 91% 97% 99%  Weight:      Height:        Intake/Output Summary (Last 24 hours) at 01/04/2020 0919 Last data filed at 01/03/2020 2340 Gross per 24 hour  Intake 840 ml  Output 276 ml  Net 564 ml   Filed Weights   01/01/20 1451 01/02/20 0300  Weight: 78 kg 81.1 kg    Examination: General exam: Appears calm and comfortable, hard of hearing Respiratory system: Clear to auscultation. Respiratory effort normal. Cardiovascular system: S1 & S2 heard, irregular rhythm, rate 60s. No pedal edema. Gastrointestinal system: Abdomen is nondistended, soft and nontender. Normal bowel sounds heard. Central nervous system: Alert and oriented. Non focal exam. Speech clear  Extremities: Symmetric in appearance bilaterally  Skin: No rashes, lesions or ulcers on exposed skin  Psychiatry: Judgement and insight appear stable. Mood & affect appropriate.   Data Reviewed: I have personally reviewed following labs and imaging studies  CBC: Recent Labs  Lab 01/01/20 1455 01/02/20 0637 01/03/20 0350 01/04/20 0350  WBC 8.9 7.4 7.4 7.0  HGB 11.2* 11.7* 11.5* 11.0*  HCT 33.4* 34.7* 34.3* 34.6*  MCV 90.8 90.4 91.7 93.8  PLT 167 166 166 485*   Basic Metabolic Panel: Recent Labs  Lab 01/01/20 1455 01/01/20 1804 01/02/20 0753 01/03/20 0350 01/04/20 0350  NA 134*  --  136 135 136  K 3.3*  --  3.2* 4.1 4.6  CL 98  --  102 104 104  CO2 23  --  23 19* 21*  GLUCOSE 118*  --  130* 99 103*  BUN 71*  --  67* 64* 56*  CREATININE 3.85*  --  3.03* 2.46* 2.05*  CALCIUM 8.9  --  8.5* 8.9 9.2  MG  --  2.1  --  2.2  --    GFR: Estimated Creatinine Clearance: 22 mL/min (A) (by C-G formula based on SCr of 2.05 mg/dL (H)). Liver Function Tests: Recent Labs  Lab 01/01/20 1804  AST 29  ALT 21  ALKPHOS 56  BILITOT 1.2  PROT 6.0*  ALBUMIN 3.5   No results for input(s): LIPASE, AMYLASE in the last 168 hours. No results for input(s): AMMONIA in the last  168 hours. Coagulation Profile: Recent Labs  Lab 01/01/20 2030  INR 1.1   Cardiac Enzymes: No results for input(s): CKTOTAL, CKMB, CKMBINDEX, TROPONINI in the last 168 hours. BNP (last 3 results) No results for input(s): PROBNP in the last 8760 hours. HbA1C: No results for input(s): HGBA1C in the last 72 hours. CBG: No results for input(s): GLUCAP in the last 168 hours. Lipid Profile: Recent Labs    01/01/20 2252  CHOL 199  HDL 37*  LDLCALC 148*  TRIG 70  CHOLHDL 5.4   Thyroid  Function Tests: Recent Labs    01/01/20 1804  TSH 3.231   Anemia Panel: No results for input(s): VITAMINB12, FOLATE, FERRITIN, TIBC, IRON, RETICCTPCT in the last 72 hours. Sepsis Labs: No results for input(s): PROCALCITON, LATICACIDVEN in the last 168 hours.  Recent Results (from the past 240 hour(s))  Respiratory Panel by RT PCR (Flu A&B, Covid) - Nasopharyngeal Swab     Status: None   Collection Time: 01/01/20  7:07 PM   Specimen: Nasopharyngeal Swab  Result Value Ref Range Status   SARS Coronavirus 2 by RT PCR NEGATIVE NEGATIVE Final    Comment: (NOTE) SARS-CoV-2 target nucleic acids are NOT DETECTED.  The SARS-CoV-2 RNA is generally detectable in upper respiratoy specimens during the acute phase of infection. The lowest concentration of SARS-CoV-2 viral copies this assay can detect is 131 copies/mL. A negative result does not preclude SARS-Cov-2 infection and should not be used as the sole basis for treatment or other patient management decisions. A negative result may occur with  improper specimen collection/handling, submission of specimen other than nasopharyngeal swab, presence of viral mutation(s) within the areas targeted by this assay, and inadequate number of viral copies (<131 copies/mL). A negative result must be combined with clinical observations, patient history, and epidemiological information. The expected result is Negative.  Fact Sheet for Patients:    PinkCheek.be  Fact Sheet for Healthcare Providers:  GravelBags.it  This test is no t yet approved or cleared by the [place] FDA and  has been authorized for detection and/or diagnosis of SARS-CoV-2 by FDA under an Emergency Use Authorization (EUA). This EUA will remain  in effect (meaning this test can be used) for the duration of the COVID-19 declaration under Section 564(b)(1) of the Act, 21 U.S.C. section 360bbb-3(b)(1), unless the authorization is terminated or revoked sooner.     Influenza A by PCR NEGATIVE NEGATIVE Final   Influenza B by PCR NEGATIVE NEGATIVE Final    Comment: (NOTE) The Xpert Xpress SARS-CoV-2/FLU/RSV assay is intended as an aid in  the diagnosis of influenza from Nasopharyngeal swab specimens and  should not be used as a sole basis for treatment. Nasal washings and  aspirates are unacceptable for Xpert Xpress SARS-CoV-2/FLU/RSV  testing.  Fact Sheet for Patients: PinkCheek.be  Fact Sheet for Healthcare Providers: GravelBags.it  This test is not yet approved or cleared by the [place] FDA and  has been authorized for detection and/or diagnosis of SARS-CoV-2 by  FDA under an Emergency Use Authorization (EUA). This EUA will remain  in effect (meaning this test can be used) for the duration of the  Covid-19 declaration under Section 564(b)(1) of the Act, 21  U.S.C. section 360bbb-3(b)(1), unless the authorization is  terminated or revoked. Performed at [facility], [address] [place], [place] [address]       Radiology Studies: No results found.    Scheduled Meds: . amLODipine  10 mg Oral Daily  . apixaban  2.5 mg Oral BID  . buPROPion  150 mg Oral Daily  . clonazePAM  0.5 mg Oral QHS  . finasteride  5 mg Oral Daily  . pantoprazole  80 mg Oral Daily  . pramipexole  1 mg Oral BID  . tamsulosin  0.4 mg  Oral Daily   Continuous Infusions: . sodium chloride 75 mL/hr at 01/03/20 2042     LOS: 3 days      Time spent: 48minutes   Dessa Phi, DO [facility] 01/04/2020, 9:19 AM   Available  via Epic secure chat 7am-7pm After these hours, please refer to coverage provider listed on [email or web address]

## 2020-01-04 NOTE — Progress Notes (Signed)
Progress Note  Patient Name: Stephen Garrett Date of Encounter: 01/04/2020  Primary Cardiologist: Fletcher Anon  Subjective   Found on the floor this morning. No trauma noted. No chest pain, dyspnea, palpitations, dizziness, presyncope, or syncope.  Renal function improving. Potassium at goal. Hemoglobin mildly low though stable.  Slight drop in PLT count. He remains in A. fib with controlled ventricular response with heart rates in the 50s to 70s bpm.  He remains on heparin drip. BP stable.  He is adamant he be discharged this morning.    Inpatient Medications    Scheduled Meds: . amLODipine  5 mg Oral Daily  . apixaban  2.5 mg Oral BID  . buPROPion  150 mg Oral Daily  . clonazePAM  0.5 mg Oral QHS  . finasteride  5 mg Oral Daily  . pantoprazole  80 mg Oral Daily  . pramipexole  1 mg Oral BID  . tamsulosin  0.4 mg Oral Daily   Continuous Infusions: . sodium chloride 75 mL/hr at 01/03/20 2042   PRN Meds: acetaminophen, ondansetron (ZOFRAN) IV   Vital Signs    Vitals:   01/04/20 0729 01/04/20 0752 01/04/20 0754 01/04/20 0758  BP: (!) 152/83 (!) 159/76 (!) 152/96 (!) 153/74  Pulse: 66 (!) 47 (!) 56 (!) 57  Resp: (!) 22 18 18    Temp: 97.6 F (36.4 C) 97.7 F (36.5 C)    TempSrc: Oral Oral    SpO2: 99% 91% 97% 99%  Weight:      Height:        Intake/Output Summary (Last 24 hours) at 01/04/2020 0803 Last data filed at 01/03/2020 2340 Gross per 24 hour  Intake 840 ml  Output 276 ml  Net 564 ml   Filed Weights   01/01/20 1451 01/02/20 0300  Weight: 78 kg 81.1 kg    Telemetry    A. fib with controlled ventricular response with ventricular rates in the 50s to 70s bpm, longest R-R interval 2.5 seconds overnight - Personally Reviewed  ECG    No new tracings - Personally Reviewed  Physical Exam   GEN:  Elderly-appearing, no acute distress.   Neck: No JVD. Cardiac:  Irregularly irregular, no murmurs, rubs, or gallops.  Respiratory: Mildly diminished breath sounds  along the bilateral bases.  GI: Soft, nontender, non-distended.   MS: No edema; No deformity. Neuro:  Alert and oriented x 3; Nonfocal.  Psych: Normal affect.  Labs    Chemistry Recent Labs  Lab 01/01/20 1455 01/01/20 1804 01/02/20 0753 01/03/20 0350 01/04/20 0350  NA   < >  --  136 135 136  K   < >  --  3.2* 4.1 4.6  CL   < >  --  102 104 104  CO2   < >  --  23 19* 21*  GLUCOSE   < >  --  130* 99 103*  BUN   < >  --  67* 64* 56*  CREATININE   < >  --  3.03* 2.46* 2.05*  CALCIUM   < >  --  8.5* 8.9 9.2  PROT  --  6.0*  --   --   --   ALBUMIN  --  3.5  --   --   --   AST  --  29  --   --   --   ALT  --  21  --   --   --   ALKPHOS  --  56  --   --   --  BILITOT  --  1.2  --   --   --   GFRNONAA   < >  --  18* 24* 29*  ANIONGAP   < >  --  11 12 11    < > = values in this interval not displayed.     Hematology Recent Labs  Lab 01/02/20 0637 01/03/20 0350 01/04/20 0350  WBC 7.4 7.4 7.0  RBC 3.84* 3.74* 3.69*  HGB 11.7* 11.5* 11.0*  HCT 34.7* 34.3* 34.6*  MCV 90.4 91.7 93.8  MCH 30.5 30.7 29.8  MCHC 33.7 33.5 31.8  RDW 14.3 14.5 14.2  PLT 166 166 146*    Cardiac EnzymesNo results for input(s): TROPONINI in the last 168 hours. No results for input(s): TROPIPOC in the last 168 hours.   BNP Recent Labs  Lab 01/01/20 1813  BNP 634.0*     DDimer No results for input(s): DDIMER in the last 168 hours.   Radiology    DG Chest 2 View  Result Date: 01/01/2020 IMPRESSION: 1. Left basilar opacities, which may represent atelectasis, aspiration, and/or pneumonia. 2. Cardiomegaly. Electronically Signed   By: Margaretha Sheffield MD   On: 01/01/2020 15:26   CT Head Wo Contrast  Result Date: 01/01/2020 IMPRESSION: Chronic atrophic and ischemic changes. Postsurgical changes without acute abnormality. Electronically Signed   By: Inez Catalina M.D.   On: 01/01/2020 18:59   US Renal  Result Date: 01/01/2020 IMPRESSION: Bilateral simple renal cysts.  No acute abnormality  noted. Electronically Signed   By: Inez Catalina M.D.   On: 01/01/2020 18:57    Cardiac Studies   2D echo 01/01/2020: 1. Left ventricular ejection fraction, by estimation, is 55 to 60%. The  left ventricle has normal function. The left ventricle has no regional  wall motion abnormalities. There is moderate to severe left ventricular  hypertrophy.  2. Right ventricular systolic function is normal. The right ventricular  size is normal. There is mildly elevated pulmonary artery systolic  pressure. The estimated right ventricular systolic pressure is 28.3 mmHg.  3. Left atrial size was severely dilated.  4. Mild mitral valve regurgitation.  5. The aortic valve is heavily calcified. Mild aortic valve stenosis.  6. The inferior vena cava is normal in size with <50% respiratory  variability, suggesting right atrial pressure of 8 mmHg.  7. Rhythm is atrial fibrillation   Patient Profile     86 y.o. male with history of reported TIAs, HTN, HLD, BPH, decreased hearing status post cochlear implant, spinal stenosis, remote tobacco use, and GERD  who we are seeing for newly diagnosed A. fib and elevated troponin.  Assessment & Plan    1.  A. fib: -Of uncertain chronicity, though given severely dilated left atrium noted on echo this admission, this is been at a minimum persistent and possibly permanent -Ventricular rates are well controlled without AV nodal blocking medications -No significantly prolonged R-R intervals -Asymptomatic -Given the patient is asymptomatic and in the context of his advanced age, we will pursue rate control strategy only -CHA2DS2-VASc 5 (HTN, age x2, TIA x2) -Now on Eliquis 2.5 mg bid (age and SCr), if his SCr continues to improve beyond 1.5, would calculate CrCl, and if this is less than 50, would transition to renally dosed Xarelto as an outpatient to use lowest effective dose of [place] -Advised patient I do not control his discharge and this this is up to his  internal medicine physician after reviewing all objective data -No plans for inpatient cardiac testing  2.  Acute on CKD stage III: -Improving with IV fluids -Continue to hold HCTZ and losartan -Avoid nephrotoxins  3.  Elevated troponin: -Minimal elevation in high-sensitivity troponin peaking at 385 -Likely supply demand ischemia in the setting of acute on CKD with dehydration and LVH -Not consistent with ACS -Echo with preserved LVSF and no wall motion abnormalities -Given lack of anginal symptoms, advanced age, and acute on CKD, there are no plans for inpatient ischemic evaluation -Transition from heparin drip to DOAC as outlined above prior to discharge  4. HTN: -Mildly elevated this morning, though has been well controlled -Titrate amlodipine to 10 mg -HCTZ and losartan held secondary to the above  5.  Hypokalemia: -Repleted  6.  Anemia: -Likely of chronic disease -Stable   For questions or updates, please contact [facility] Please consult [email or web address] for contact info under Cardiology/STEMI.    Signed, Christell Faith, PA-C [facility] Pager: [phone] 01/04/2020, 8:03 AM

## 2020-01-04 NOTE — Plan of Care (Signed)
  Problem: Education: Goal: Ability to demonstrate management of disease process will improve Outcome: Progressing   Problem: Education: Goal: Ability to verbalize understanding of medication therapies will improve Outcome: Progressing   Problem: Activity: Goal: Capacity to carry out activities will improve Outcome: Progressing   Problem: Pain Managment: Goal: General experience of comfort will improve Outcome: Progressing   Problem: Safety: Goal: Ability to remain free from injury will improve Outcome: Progressing

## 2020-01-04 NOTE — Progress Notes (Signed)
CCMD called for a slow ventricular response of 2.55 seconds. Pt asymptomatic on assessment. VSS. Notify B Morrison. Will continue to monitor.

## 2020-01-04 NOTE — Progress Notes (Signed)
Pt found on floor. States he was trying to go to the bathroom. VSS. No injury noted. MD and family notified

## 2020-01-05 LAB — BASIC METABOLIC PANEL
Anion gap: 8 (ref 5–15)
BUN: 40 mg/dL — ABNORMAL HIGH (ref 8–23)
CO2: 24 mmol/L (ref 22–32)
Calcium: 9.1 mg/dL (ref 8.9–10.3)
Chloride: 104 mmol/L (ref 98–111)
Creatinine, Ser: 1.55 mg/dL — ABNORMAL HIGH (ref 0.61–1.24)
GFR, Estimated: 41 mL/min — ABNORMAL LOW (ref >60)
Glucose, Bld: 102 mg/dL — ABNORMAL HIGH (ref 70–99)
Potassium: 4.8 mmol/L (ref 3.5–5.1)
Sodium: 136 mmol/L (ref 135–145)

## 2020-01-05 LAB — CBC
HCT: 35 % — ABNORMAL LOW (ref 39.0–52.0)
Hemoglobin: 11.5 g/dL — ABNORMAL LOW (ref 13.0–17.0)
MCH: 30.6 pg (ref 26.0–34.0)
MCHC: 32.9 g/dL (ref 30.0–36.0)
MCV: 93.1 fL (ref 80.0–100.0)
Platelets: 163 10*3/uL (ref 150–400)
RBC: 3.76 MIL/uL — ABNORMAL LOW (ref 4.22–5.81)
RDW: 14.3 % (ref 11.5–15.5)
WBC: 7.7 10*3/uL (ref 4.0–10.5)
nRBC: 0 % (ref 0.0–0.2)

## 2020-01-05 MED ORDER — AMLODIPINE BESYLATE 10 MG PO TABS
10.0000 mg | ORAL_TABLET | Freq: Every day | ORAL | 1 refills | Status: DC
Start: 2020-01-05 — End: 2020-01-06

## 2020-01-05 MED ORDER — APIXABAN 2.5 MG PO TABS: 2.5000 mg | ORAL_TABLET | Freq: Two times a day (BID) | ORAL | 1 refills | Status: AC

## 2020-01-05 NOTE — Plan of Care (Signed)
  Problem: Education: Goal: Ability to demonstrate management of disease process will improve Outcome: Progressing   Problem: Activity: Goal: Capacity to carry out activities will improve Outcome: Progressing   Problem: Cardiac: Goal: Ability to achieve and maintain adequate cardiopulmonary perfusion will improve Outcome: Progressing   Problem: Clinical Measurements: Goal: Respiratory complications will improve Outcome: Progressing   Problem: Safety: Goal: Ability to remain free from injury will improve Outcome: Progressing   

## 2020-01-05 NOTE — Discharge Summary (Signed)
Physician Discharge Summary  Stephen Garrett FBP:[id number] DOB: 1924/02/04 DOA: 01/01/2020  PCP: [name], MD  Admit date: 01/01/2020 Discharge date: 01/05/2020  Admitted From: Home ILF Disposition:  Home ILF with home health   Recommendations for Outpatient Follow-up:  1. Follow up with PCP in 1 week 2. Follow up with Cardiology  3. Discussed with increase PO intake of water at home and to refrain from driving for the next several days while recovering from acute illness  4. HCTZ and Cozaar on hold due to AKI. Can resume as able as outpatient after BMP recheck   Discharge Condition: Stable CODE STATUS: Full  Diet recommendation:  Diet Orders (From admission, onward)    Start     Ordered   01/01/20 2054  Diet Heart Room service appropriate? Yes; Fluid consistency: Thin  Diet effective now       Question Answer Comment  Room service appropriate? Yes   Fluid consistency: Thin      01/01/20 2053         Brief/Interim Summary: Stephen Kail Brickeris a 86 y.o.malewith medical history significant ofhypertension, decreased hearing, TIAs, restless leg syndrome, hyperlipidemia, BPH, GERD, status post cochlear implant and spinal stenosis who was here with his daughter presenting with presyncopal episodes and palpitations. He lives in assisted living facility where he was evaluated. Patient found to have new onset A. fib. He was brought in where he was seen in the ER and evaluated. He appears to have some mild EKG changes but significant increase in his troponins. Patient said he fell yesterday while sitting playing bridge. He did not hit his head. He did not feel any chest pain.Today he repeated episode where he was noted to have passed out midsentence prior to coming in. He is fully with it but has poor hearing despite cochlear implant.  He was admitted with non-ST elevation MI and new onset atrial fibrillation, AKI.  He was started on IV heparin which was transitioned to Eliquis.   His kidney function continue to improve with IV fluid.  He had an unwitnessed fall on morning of 11/6 without any injuries.  He was monitored overnight without any acute issues.  He was discharged home in stable condition.  Discharge Diagnoses:  Principal Problem:   NSTEMI (non-ST elevated myocardial infarction) ([place]) Active Problems:   Essential hypertension, benign   GERD   Chronic venous insufficiency   Acute renal failure superimposed on stage 3 chronic kidney disease (HCC)   Hypokalemia   A-fib (HCC)   NSTEMI -Troponin peak at 385 -Echocardiogram showed EF 55 to 60%, no regional wall motion abnormality, moderate to severe LVH -Secondary to demand ischemia in setting of A. fib and AKI -Given lack of anginal symptoms, no plans for inpatient ischemic evaluation -Continue Eliquis  New onset atrial fibrillation -CHA2DS2-VASc 3 -Continue Eliquis -Lopressor held due to bradycardia.  Patient remains asymptomatic from A. fib at this point  AKI on CKD stage IIIb -Admitted with creatinine 3.85, improved to 3.03 -Baseline creatinine 1.4 -Hold HCTZ, Cozaar. Avoid nephrotoxins  -Resolved back to baseline  HTN  -Continue Norvasc -Hold HCTZ, Cozaar due to AKI -Orthostatic VS negative   Depression/anxiety  -Continue Wellbutrin, Klonopin  BPH  -Continue Proscar, Flomax  GERD -Continue PPI    Discharge Instructions   Allergies as of 01/05/2020      Reactions   Atorvastatin    REACTION: raises blood pressure   Pravastatin Sodium    REACTION: raises blood pressure   Statins  REACTION: raises bp      Medication List    STOP taking these medications   aspirin 81 MG EC tablet   hydrochlorothiazide 25 MG tablet Commonly known as: HYDRODIURIL   losartan 100 MG tablet Commonly known as: COZAAR     TAKE these medications   amLODipine 10 MG tablet Commonly known as: NORVASC Take 1 tablet (10 mg total) by mouth daily. What changed:   medication  strength  how much to take   apixaban 2.5 MG Tabs tablet Commonly known as: ELIQUIS Take 1 tablet (2.5 mg total) by mouth 2 (two) times daily.   buPROPion 150 MG 24 hr tablet Commonly known as: WELLBUTRIN XL TAKE 1 TABLET BY MOUTH DAILY   clonazePAM 0.5 MG tablet Commonly known as: KLONOPIN TAKE ONE TO TWO TABLETS AT BEDTIME What changed: See the new instructions.   finasteride 5 MG tablet Commonly known as: PROSCAR TAKE ONE TABLET BY MOUTH EVERY DAY   multivitamin capsule Take 1 capsule by mouth daily.   omeprazole 40 MG capsule Commonly known as: PRILOSEC TAKE 1 CAPSULE BY MOUTH ONCE DAILY   polyethylene glycol 17 g packet Commonly known as: MIRALAX / GLYCOLAX Take 17 g by mouth daily.   pramipexole 1 MG tablet Commonly known as: MIRAPEX TAKE ONE TABLET AT DINNER AND ONE TABLETAT BEDTIME What changed: See the new instructions.   pyridoxine 100 MG tablet Commonly known as: B-6 Take 100 mg by mouth daily.   tamsulosin 0.4 MG Caps capsule Commonly known as: FLOMAX TAKE 1 CAPSULE EVERY DAY       Allergies  Allergen Reactions  . Atorvastatin     REACTION: raises blood pressure  . Pravastatin Sodium     REACTION: raises blood pressure  . Statins     REACTION: raises bp    Consultations:  Cardiology   Procedures/Studies: DG Chest 2 View  Result Date: 01/01/2020 CLINICAL DATA:  AFib. EXAM: CHEST - 2 VIEW COMPARISON:  11/01/2017. FINDINGS: Enlarged cardiac silhouette. Aortic atherosclerosis. Left basilar opacities. No visible pleural effusions or pneumothorax. No acute osseous abnormality. IMPRESSION: 1. Left basilar opacities, which may represent atelectasis, aspiration, and/or pneumonia. 2. Cardiomegaly. Electronically Signed   By: Margaretha Sheffield MD   On: 01/01/2020 15:26   CT Head Wo Contrast  Result Date: 01/01/2020 CLINICAL DATA:  Weakness EXAM: CT HEAD WITHOUT CONTRAST TECHNIQUE: Contiguous axial images were obtained from the base of the skull  through the vertex without intravenous contrast. COMPARISON:  09/17/2017 FINDINGS: Brain: No evidence of acute infarction, hemorrhage, hydrocephalus, extra-axial collection or mass lesion/mass effect. Chronic atrophic and ischemic changes are noted. Vascular: No hyperdense vessel or unexpected calcification. Skull: Normal. Negative for fracture or focal lesion. Sinuses/Orbits: No acute finding. Other: Postsurgical changes are noted on the right consistent with cochlear implant. IMPRESSION: Chronic atrophic and ischemic changes. Postsurgical changes without acute abnormality. Electronically Signed   By: Inez Catalina M.D.   On: 01/01/2020 18:59   US Renal  Result Date: 01/01/2020 CLINICAL DATA:  Acute renal injury EXAM: RENAL / URINARY TRACT ULTRASOUND COMPLETE COMPARISON:  None. FINDINGS: Right Kidney: Renal measurements: 10.7 x 4.2 x 5.3 cm. = volume: 123 mL. Cysts are noted within the right kidney. The largest of these measures 3.6 cm in greatest dimension in the midportion of the right kidney. Smaller cysts are noted as well. Overall less than 10 cysts are seen. No mass lesion or hydronephrosis is seen. Left Kidney: Renal measurements: 9.6 x 4.4 x 5.7 cm. = volume:  128 mL. 2.5 cm cyst is noted within the midportion of the left kidney. No mass lesion or hydronephrosis is noted. Bladder: Appears normal for degree of bladder distention. Other: None. IMPRESSION: Bilateral simple renal cysts.  No acute abnormality noted. Electronically Signed   By: Inez Catalina M.D.   On: 01/01/2020 18:57   ECHOCARDIOGRAM COMPLETE  Result Date: 01/02/2020    ECHOCARDIOGRAM REPORT   Patient Name:   Stephen Garrett Date of Exam: 01/01/2020 Medical Rec #:  [id number]       Height:       66.0 in Accession #:    [id number]      Weight:       172.0 lb Date of Birth:  [birth year]        BSA:          1.876 m Patient Age:    86 years        BP:           142/69 mmHg Patient Gender: M               HR:           63 bpm. Exam Location:  [facility]  Procedure: 2D Echo, Cardiac Doppler and Color Doppler Indications:     R55 Syncope  History:         Patient has prior history of Echocardiogram examinations, most                  recent 09/15/2017. Risk Factors:Hypertension and Dyslipidemia.                  Murmur. Renal Insufficiency.  Sonographer:     Wilford Sports Rodgers-Jones Referring Phys:  [id number] [place] Diagnosing Phys: Ida Rogue MD IMPRESSIONS  1. Left ventricular ejection fraction, by estimation, is 55 to 60%. The left ventricle has normal function. The left ventricle has no regional wall motion abnormalities. There is moderate to severe left ventricular hypertrophy.  2. Right ventricular systolic function is normal. The right ventricular size is normal. There is mildly elevated pulmonary artery systolic pressure. The estimated right ventricular systolic pressure is 84.6 mmHg.  3. Left atrial size was severely dilated.  4. Mild mitral valve regurgitation.  5. The aortic valve is heavily calcified. Mild aortic valve stenosis.  6. The inferior vena cava is normal in size with <50% respiratory variability, suggesting right atrial pressure of 8 mmHg.  7. Rhythm is atrial fibrillation FINDINGS  Left Ventricle: Left ventricular ejection fraction, by estimation, is 55 to 60%. The left ventricle has normal function. The left ventricle has no regional wall motion abnormalities. The left ventricular internal cavity size was normal in size. There is  moderate to severe left ventricular hypertrophy. Left ventricular diastolic parameters are indeterminate. Right Ventricle: The right ventricular size is normal. No increase in right ventricular wall thickness. Right ventricular systolic function is normal. There is mildly elevated pulmonary artery systolic pressure. The tricuspid regurgitant velocity is 2.74  m/s, and with an assumed right atrial pressure of 10 mmHg, the estimated right ventricular systolic pressure is 96.2 mmHg. Left Atrium: Left atrial size  was severely dilated. Right Atrium: Right atrial size was normal in size. Pericardium: There is no evidence of pericardial effusion. Mitral Valve: The mitral valve is normal in structure. There is moderate calcification of the mitral valve leaflet(s). Mild mitral annular calcification. Mild mitral valve regurgitation. No evidence of mitral valve stenosis. Tricuspid Valve: The tricuspid valve is normal in structure.  Tricuspid valve regurgitation is mild . No evidence of tricuspid stenosis. Aortic Valve: The aortic valve is normal in structure. Aortic valve regurgitation is not visualized. Mild aortic stenosis is present. Aortic valve mean gradient measures 9.6 mmHg. Aortic valve peak gradient measures 16.7 mmHg. Aortic valve area, by VTI measures 2.00 cm. Pulmonic Valve: The pulmonic valve was normal in structure. Pulmonic valve regurgitation is mild. No evidence of pulmonic stenosis. Aorta: The aortic root is normal in size and structure. Venous: The inferior vena cava is normal in size with less than 50% respiratory variability, suggesting right atrial pressure of 8 mmHg. IAS/Shunts: No atrial level shunt detected by color flow Doppler.  LEFT VENTRICLE PLAX 2D LVIDd:         3.96 cm LVIDs:         2.65 cm LV PW:         1.44 cm LV IVS:        1.50 cm LVOT diam:     2.10 cm LV SV:         64 LV SV Index:   34 LVOT Area:     3.46 cm  RIGHT VENTRICLE             IVC RV Basal diam:  4.33 cm     IVC diam: 2.01 cm RV S prime:     15.10 cm/s LEFT ATRIUM              Index       RIGHT ATRIUM           Index LA diam:        4.60 cm  2.45 cm/m  RA Area:     27.30 cm LA Vol (A2C):   130.0 ml 69.31 ml/m RA Volume:   102.00 ml 54.38 ml/m LA Vol (A4C):   101.0 ml 53.85 ml/m LA Biplane Vol: 120.0 ml 63.98 ml/m  AORTIC VALVE AV Area (Vmax):    1.66 cm AV Area (Vmean):   1.83 cm AV Area (VTI):     2.00 cm AV Vmax:           204.40 cm/s AV Vmean:          145.000 cm/s AV VTI:            0.319 m AV Peak Grad:      16.7  mmHg AV Mean Grad:      9.6 mmHg LVOT Vmax:         98.00 cm/s LVOT Vmean:        76.633 cm/s LVOT VTI:          0.184 m LVOT/AV VTI ratio: 0.58  AORTA Ao Root diam: 3.60 cm MV E velocity: 137.00 cm/s  TRICUSPID VALVE                             TR Peak grad:   30.0 mmHg                             TR Vmax:        274.00 cm/s                              SHUNTS  Systemic VTI:  0.18 m                             Systemic Diam: 2.10 cm Ida Rogue MD Electronically signed by Ida Rogue MD Signature Date/Time: 01/02/2020/8:42:54 AM    Final         Discharge Exam: Vitals:   01/05/20 0440 01/05/20 0800  BP: (!) 162/82 (!) 153/77  Pulse: (!) 52 (!) 57  Resp: 18 16  Temp: 97.9 F (36.6 C) 97.6 F (36.4 C)  SpO2: 97% 100%     General: Pt is alert, awake, not in acute distress, hard of hearing Cardiovascular: Irregular rhythm, rate 50s, S1/S2 +, no edema Respiratory: CTA bilaterally, no wheezing, no rhonchi, no respiratory distress, no conversational dyspnea  Abdominal: Soft, NT, ND, bowel sounds + Extremities: no edema, no cyanosis Psych: Normal mood and affect, stable judgement and insight     The results of significant diagnostics from this hospitalization (including imaging, microbiology, ancillary and laboratory) are listed below for reference.     Microbiology: Recent Results (from the past 240 hour(s))  Respiratory Panel by RT PCR (Flu A&B, Covid) - Nasopharyngeal Swab     Status: None   Collection Time: 01/01/20  7:07 PM   Specimen: Nasopharyngeal Swab  Result Value Ref Range Status   SARS Coronavirus 2 by RT PCR NEGATIVE NEGATIVE Final    Comment: (NOTE) SARS-CoV-2 target nucleic acids are NOT DETECTED.  The SARS-CoV-2 RNA is generally detectable in upper respiratoy specimens during the acute phase of infection. The lowest concentration of SARS-CoV-2 viral copies this assay can detect is 131 copies/mL. A negative result does not preclude  SARS-Cov-2 infection and should not be used as the sole basis for treatment or other patient management decisions. A negative result may occur with  improper specimen collection/handling, submission of specimen other than nasopharyngeal swab, presence of viral mutation(s) within the areas targeted by this assay, and inadequate number of viral copies (<131 copies/mL). A negative result must be combined with clinical observations, patient history, and epidemiological information. The expected result is Negative.  Fact Sheet for Patients:  PinkCheek.be  Fact Sheet for Healthcare Providers:  GravelBags.it  This test is no t yet approved or cleared by the [place] FDA and  has been authorized for detection and/or diagnosis of SARS-CoV-2 by FDA under an Emergency Use Authorization (EUA). This EUA will remain  in effect (meaning this test can be used) for the duration of the COVID-19 declaration under Section 564(b)(1) of the Act, 21 U.S.C. section 360bbb-3(b)(1), unless the authorization is terminated or revoked sooner.     Influenza A by PCR NEGATIVE NEGATIVE Final   Influenza B by PCR NEGATIVE NEGATIVE Final    Comment: (NOTE) The Xpert Xpress SARS-CoV-2/FLU/RSV assay is intended as an aid in  the diagnosis of influenza from Nasopharyngeal swab specimens and  should not be used as a sole basis for treatment. Nasal washings and  aspirates are unacceptable for Xpert Xpress SARS-CoV-2/FLU/RSV  testing.  Fact Sheet for Patients: PinkCheek.be  Fact Sheet for Healthcare Providers: GravelBags.it  This test is not yet approved or cleared by the [place] FDA and  has been authorized for detection and/or diagnosis of SARS-CoV-2 by  FDA under an Emergency Use Authorization (EUA). This EUA will remain  in effect (meaning this test can be used) for the duration of the   Covid-19 declaration under Section 564(b)(1) of the Act, 21  U.S.C. section 360bbb-3(b)(1), unless the authorization is  terminated or revoked. Performed at [facility], [address] [place], [place] [address]      Labs: BNP (last 3 results) Recent Labs    01/01/20 1813  BNP 102.5*   Basic Metabolic Panel: Recent Labs  Lab 01/01/20 1455 01/01/20 1804 01/02/20 0753 01/03/20 0350 01/04/20 0350 01/05/20 0449  NA 134*  --  136 135 136 136  K 3.3*  --  3.2* 4.1 4.6 4.8  CL 98  --  102 104 104 104  CO2 23  --  23 19* 21* 24  GLUCOSE 118*  --  130* 99 103* 102*  BUN 71*  --  67* 64* 56* 40*  CREATININE 3.85*  --  3.03* 2.46* 2.05* 1.55*  CALCIUM 8.9  --  8.5* 8.9 9.2 9.1  MG  --  2.1  --  2.2  --   --    Liver Function Tests: Recent Labs  Lab 01/01/20 1804  AST 29  ALT 21  ALKPHOS 56  BILITOT 1.2  PROT 6.0*  ALBUMIN 3.5   No results for input(s): LIPASE, AMYLASE in the last 168 hours. No results for input(s): AMMONIA in the last 168 hours. CBC: Recent Labs  Lab 01/01/20 1455 01/02/20 0637 01/03/20 0350 01/04/20 0350 01/05/20 0449  WBC 8.9 7.4 7.4 7.0 7.7  HGB 11.2* 11.7* 11.5* 11.0* 11.5*  HCT 33.4* 34.7* 34.3* 34.6* 35.0*  MCV 90.8 90.4 91.7 93.8 93.1  PLT 167 166 166 146* 163   Cardiac Enzymes: No results for input(s): CKTOTAL, CKMB, CKMBINDEX, TROPONINI in the last 168 hours. BNP: Invalid input(s): POCBNP CBG: No results for input(s): GLUCAP in the last 168 hours. D-Dimer No results for input(s): DDIMER in the last 72 hours. Hgb A1c No results for input(s): HGBA1C in the last 72 hours. Lipid Profile No results for input(s): CHOL, HDL, LDLCALC, TRIG, CHOLHDL, LDLDIRECT in the last 72 hours. Thyroid function studies No results for input(s): TSH, T4TOTAL, T3FREE, THYROIDAB in the last 72 hours.  Invalid input(s): FREET3 Anemia work up No results for input(s): VITAMINB12, FOLATE, FERRITIN, TIBC, IRON, RETICCTPCT in the last 72  hours. Urinalysis    Component Value Date/Time   COLORURINE AMBER (A) 01/01/2020 1907   APPEARANCEUR CLOUDY (A) 01/01/2020 1907   LABSPEC 1.017 01/01/2020 1907   PHURINE 5.0 01/01/2020 1907   GLUCOSEU NEGATIVE 01/01/2020 1907   HGBUR NEGATIVE 01/01/2020 1907   HGBUR large 09/03/2008 0933   BILIRUBINUR NEGATIVE 01/01/2020 1907   BILIRUBINUR Negative 08/04/2017 1446   KETONESUR NEGATIVE 01/01/2020 1907   PROTEINUR NEGATIVE 01/01/2020 1907   UROBILINOGEN 0.2 08/04/2017 1446   UROBILINOGEN 0.2 09/03/2008 0933   NITRITE NEGATIVE 01/01/2020 1907   LEUKOCYTESUR NEGATIVE 01/01/2020 1907   Sepsis Labs Invalid input(s): PROCALCITONIN,  WBC,  LACTICIDVEN Microbiology Recent Results (from the past 240 hour(s))  Respiratory Panel by RT PCR (Flu A&B, Covid) - Nasopharyngeal Swab     Status: None   Collection Time: 01/01/20  7:07 PM   Specimen: Nasopharyngeal Swab  Result Value Ref Range Status   SARS Coronavirus 2 by RT PCR NEGATIVE NEGATIVE Final    Comment: (NOTE) SARS-CoV-2 target nucleic acids are NOT DETECTED.  The SARS-CoV-2 RNA is generally detectable in upper respiratoy specimens during the acute phase of infection. The lowest concentration of SARS-CoV-2 viral copies this assay can detect is 131 copies/mL. A negative result does not preclude SARS-Cov-2 infection and should not be used as the sole basis for  treatment or other patient management decisions. A negative result may occur with  improper specimen collection/handling, submission of specimen other than nasopharyngeal swab, presence of viral mutation(s) within the areas targeted by this assay, and inadequate number of viral copies (<131 copies/mL). A negative result must be combined with clinical observations, patient history, and epidemiological information. The expected result is Negative.  Fact Sheet for Patients:  PinkCheek.be  Fact Sheet for Healthcare Providers:   GravelBags.it  This test is no t yet approved or cleared by the [place] FDA and  has been authorized for detection and/or diagnosis of SARS-CoV-2 by FDA under an Emergency Use Authorization (EUA). This EUA will remain  in effect (meaning this test can be used) for the duration of the COVID-19 declaration under Section 564(b)(1) of the Act, 21 U.S.C. section 360bbb-3(b)(1), unless the authorization is terminated or revoked sooner.     Influenza A by PCR NEGATIVE NEGATIVE Final   Influenza B by PCR NEGATIVE NEGATIVE Final    Comment: (NOTE) The Xpert Xpress SARS-CoV-2/FLU/RSV assay is intended as an aid in  the diagnosis of influenza from Nasopharyngeal swab specimens and  should not be used as a sole basis for treatment. Nasal washings and  aspirates are unacceptable for Xpert Xpress SARS-CoV-2/FLU/RSV  testing.  Fact Sheet for Patients: PinkCheek.be  Fact Sheet for Healthcare Providers: GravelBags.it  This test is not yet approved or cleared by the [place] FDA and  has been authorized for detection and/or diagnosis of SARS-CoV-2 by  FDA under an Emergency Use Authorization (EUA). This EUA will remain  in effect (meaning this test can be used) for the duration of the  Covid-19 declaration under Section 564(b)(1) of the Act, 21  U.S.C. section 360bbb-3(b)(1), unless the authorization is  terminated or revoked. Performed at [facility], [address] [place], [place] [address]      Patient was seen and examined on the day of discharge and was found to be in stable condition. Time coordinating discharge: 25 minutes including assessment and coordination of care, as well as examination of the patient.   SIGNED:  Dessa Phi, DO [facility] 01/05/2020, 8:58 AM

## 2020-01-05 NOTE — Plan of Care (Signed)

## 2020-01-05 NOTE — TOC Transition Note (Signed)
Transition of Care [facility]) - CM/SW Discharge Note   Patient Details  Name: Stephen Garrett MRN: [id number] Date of Birth: [birth year]  [facility] CM/SW Contact:  Meriel Flavors, LCSW Phone Number: 01/05/2020, 11:04 AM   Clinical Narrative:    CSW spoke with patient's daughter to discuss discharge plan. Patient is being transported back to [facility] independent living by his daughter, [name]. Patient has orders in for HH/PT, CSW confirmed referral with [facility] and they will provide HH/PT          Patient Goals and CMS Choice        Discharge Placement                       Discharge Plan and Services                                     Social Determinants of Health (SDOH) Interventions     Readmission Risk Interventions No flowsheet data found.

## 2020-01-05 NOTE — Discharge Instructions (Signed)
Heart Attack A heart attack occurs when blood and oxygen supply to the heart is cut off. A heart attack causes damage to the heart that cannot be fixed. A heart attack is also called a myocardial infarction, or MI. If you think you are having a heart attack, do not wait to see if the symptoms will go away. Get medical help right away. What are the causes? This condition may be caused by:  A fatty substance (plaque) in the blood vessels (arteries). This can block the flow of blood to the heart.  A blood clot in the blood vessels that go to the heart. The blood clot blocks blood flow.  Low blood pressure.  An abnormal heartbeat.  Some diseases, such as problems in red blood cells (anemia)orproblems in breathing (respiratory failure).  Tightening (spasm) of a blood vessel that cuts off blood to the heart.  A tear in a blood vessel of the heart.  High blood pressure. What increases the risk? The following factors may make you more likely to develop this condition:  Aging. The older you are, the higher your risk.  Having a personal or family history of chest pain, heart attack, stroke, or narrowing of the arteries in the legs, arms, head, or stomach (peripheral artery disease).  Being male.  Smoking.  Not getting regular exercise.  Being overweight or obese.  Having high blood pressure.  Having high cholesterol.  Having diabetes.  Drinking too much alcohol.  Using illegal drugs, such as cocaine or methamphetamine. What are the signs or symptoms? Symptoms of this condition include:  Chest pain. It may feel like: ? Crushing or squeezing. ? Tightness, pressure, fullness, or heaviness.  Pain in the arm, neck, jaw, back, or upper body.  Shortness of breath.  Heartburn.  Upset stomach (indigestion).  Feeling like you may vomit (nauseous).  Cold sweats.  Feeling tired.  Sudden light-headedness. How is this treated? A heart attack must be treated as soon as  possible. Treatment may include:  Medicines to: ? Break up or dissolve blood clots. ? Thin blood and help prevent blood clots. ? Treat blood pressure. ? Improve blood flow to the heart. ? Reduce pain. ? Reduce cholesterol.  Procedures to widen a blocked artery and keep it open.  Open heart surgery.  Receiving oxygen.  Making your heart strong again (cardiac rehabilitation) through exercise, education, and counseling. Follow these instructions at home: Medicines  Take over-the-counter and prescription medicines only as told by your doctor. You may need to take medicine: ? To keep your blood from clotting too easily. ? To control blood pressure. ? To lower cholesterol. ? To control heart rhythms.  Do not take these medicines unless your doctor says it is okay: ? NSAIDs, such as ibuprofen. ? Supplements that have vitamin A, vitamin E, or both. ? Hormone replacement therapy that has estrogen with or without progestin. Lifestyle      Do not use any products that have nicotine or tobacco, such as cigarettes, e-cigarettes, and chewing tobacco. If you need help quitting, ask your doctor.  Avoid secondhand smoke.  Exercise regularly. Ask your doctor about a cardiac rehab program.  Eat heart-healthy foods. Your doctor will tell you what foods to eat.  Stay at a healthy weight.  Lower your stress level.  Do not use illegal drugs. Alcohol use  Do not drink alcohol if: ? Your doctor tells you not to drink. ? You are pregnant, may be pregnant, or are planning to become pregnant.    If you drink alcohol: ? Limit how much you use to:  0-1 drink a day for women.  0-2 drinks a day for men. ? Know how much alcohol is in your drink. In the [place] one drink equals one 12 oz bottle of beer (355 mL), one 5 oz glass of wine (148 mL), or one 1 oz glass of hard liquor (44 mL). General instructions  Work with your doctor to treat other problems you may have, such as diabetes or high  blood pressure.  Get screened for depression. Get treatment if needed.  Keep your vaccines up to date. Get the flu shot (influenza vaccine) every year.  Keep all follow-up visits as told by your doctor. This is important. Contact a doctor if:  You feel very sad.  You have trouble doing your daily activities. Get help right away if:  You have sudden, unexplained discomfort in your chest, arms, back, neck, jaw, or upper body.  You have shortness of breath.  You have sudden sweating or clammy skin.  You feel like you may vomit.  You vomit.  You feel tired or weak.  You get light-headed or dizzy.  You feel your heart beating fast.  You feel your heart skipping beats.  You have blood pressure that is higher than 180/120. These symptoms may be an emergency. Do not wait to see if the symptoms will go away. Get medical help right away. Call your local emergency services (911 in the [place] Do not drive yourself to the hospital. Summary  A heart attack occurs when blood and oxygen supply to the heart is cut off.  Do not take NSAIDs unless your doctor says it is okay.  Do not smoke. Avoid secondhand smoke.  Exercise regularly. Ask your doctor about a cardiac rehab program. This information is not intended to replace advice given to you by your health care provider. Make sure you discuss any questions you have with your health care provider. Document Revised: 05/28/2018 Document Reviewed: 05/28/2018 Elsevier Patient Education  2020 Elsevier Inc.  

## 2020-01-06 ENCOUNTER — Encounter: Payer: Self-pay | Admitting: Cardiology

## 2020-01-06 ENCOUNTER — Telehealth: Payer: Self-pay

## 2020-01-06 ENCOUNTER — Ambulatory Visit (INDEPENDENT_AMBULATORY_CARE_PROVIDER_SITE_OTHER): Payer: Medicare Other | Admitting: Cardiology

## 2020-01-06 ENCOUNTER — Other Ambulatory Visit: Payer: Self-pay

## 2020-01-06 VITALS — BP 152/90 | HR 53 | Ht 66.0 in | Wt 186.4 lb

## 2020-01-06 DIAGNOSIS — I10 Essential (primary) hypertension: Secondary | ICD-10-CM

## 2020-01-06 DIAGNOSIS — E7800 Pure hypercholesterolemia, unspecified: Secondary | ICD-10-CM

## 2020-01-06 DIAGNOSIS — R600 Localized edema: Secondary | ICD-10-CM

## 2020-01-06 DIAGNOSIS — I4819 Other persistent atrial fibrillation: Secondary | ICD-10-CM

## 2020-01-06 MED ORDER — LOSARTAN POTASSIUM 50 MG PO TABS: 50.0000 mg | ORAL_TABLET | Freq: Every day | ORAL | 3 refills | Status: DC

## 2020-01-06 MED ORDER — LOSARTAN POTASSIUM 50 MG PO TABS: 25.0000 mg | ORAL_TABLET | Freq: Every day | ORAL | 3 refills | Status: DC

## 2020-01-06 NOTE — Progress Notes (Signed)
Cardiology Office Note:    Date:  01/06/2020   ID:  Stephen Garrett, DOB May 17, 1923, MRN [id number]  PCP:  [name], MD  [facility] Cardiologist:  Kate Sable, MD  [place] Electrophysiologist:  None   Referring MD: [name], MD   Chief Complaint  Patient presents with  . OTHER    Rapid Heart Rate c/o abd pain/back and sob. Meds reviewed verbally with pt.    History of Present Illness:    Stephen Garrett is a 86 y.o. male with a hx of hypertension, hyperlipidemia, hard of hearing, TIA's presents for follow-up.    He was recently hospitalized due to fatigue and presyncope.  In the ED, EKG showed atrial fibrillation.  Work-up with echocardiogram 01/01/2020 showed normal ejection fraction, EF 55 to 60%, severe LA dilatation.  Cardioversion not attempted due to low probability of success with severely dilated left atrium.  Was started on Eliquis 2.5 mg twice daily.  His heart rate was low, as such beta-blocker was held on discharge.  He is to be on HCTZ, which was held on discharge due to renal dysfunction.  He feels well, has lower extremity edema which improves when laying flat or waking up.  States breathing okay.  Eliquis has not been picked up/delivered by pharmacy yet.  Past Medical History:  Diagnosis Date  . Actinic keratosis   . BPH (benign prostatic hypertrophy)   . Diseases of lips   . Diverticulosis of colon 06/2003  . ED (erectile dysfunction)   . GERD (gastroesophageal reflux disease)   . Heart murmur    as child  . HLD (hyperlipidemia)   . HOH (hard of hearing)    Cochlear implant right, hearing aid left  . HTN (hypertension)   . Numbness and tingling in both hands    intermittent, trying braces   . Osteoarthritis of knee    severe, bilat  . Renal insufficiency   . Restless legs syndrome (RLS)   . Sleep disturbance, unspecified   . Spinal stenosis, lumbar region, without neurogenic claudication     Past Surgical History:   Procedure Laterality Date  . CATARACT EXTRACTION, BILATERAL  2011   Dr. Thomasene Ripple  . COCHLEAR IMPLANT Right 02/15/2017   Procedure: RIGHT COCHLEAR IMPLANT;  Surgeon: Vicie Mutters, MD;  Location: [place];  Service: ENT;  Laterality: Right;  . FINGER SURGERY     Right 4th finger  . [facility]  10/2009   Dr. Bary Castilla  . LUMBAR SPINE SURGERY  06/2005   stenosis repair  . PTOSIS REPAIR Bilateral 04/19/2019   Procedure: BLEPHAROPTOSIS REPAIR; RESECT SKIN;  Surgeon: Karle Starch, MD;  Location: [place];  Service: Ophthalmology;  Laterality: Bilateral;  . TONSILLECTOMY  childhood  . TOTAL KNEE ARTHROPLASTY  06/2008   bilat (Dr. Marry Guan)    Current Medications: Current Meds  Medication Sig  . apixaban (ELIQUIS) 2.5 MG TABS tablet Take 1 tablet (2.5 mg total) by mouth 2 (two) times daily.  Marland Kitchen buPROPion (WELLBUTRIN XL) 150 MG 24 hr tablet TAKE 1 TABLET BY MOUTH DAILY (Patient taking differently: Take 150 mg by mouth daily. )  . clonazePAM (KLONOPIN) 0.5 MG tablet TAKE ONE TO TWO TABLETS AT BEDTIME (Patient taking differently: Take 0.5-1 mg by mouth at bedtime. )  . finasteride (PROSCAR) 5 MG tablet TAKE ONE TABLET BY MOUTH EVERY DAY (Patient taking differently: Take 5 mg by mouth daily. )  . Multiple Vitamin (MULTIVITAMIN) capsule Take 1 capsule  by mouth daily.    Marland Kitchen omeprazole (PRILOSEC) 40 MG capsule TAKE 1 CAPSULE BY MOUTH ONCE DAILY (Patient taking differently: Take 40 mg by mouth daily. )  . polyethylene glycol (MIRALAX / GLYCOLAX) packet Take 17 g by mouth daily.  . pramipexole (MIRAPEX) 1 MG tablet TAKE ONE TABLET AT DINNER AND ONE TABLETAT BEDTIME (Patient taking differently: Take 1 mg by mouth 2 (two) times daily. TAKE ONE TABLET AT DINNER AND ONE TABLETAT BEDTIME)  . pyridoxine (B-6) 100 MG tablet Take 100 mg by mouth daily.  . tamsulosin (FLOMAX) 0.4 MG CAPS capsule TAKE 1 CAPSULE EVERY DAY (Patient taking differently: Take 0.4 mg by mouth daily. )  . [DISCONTINUED]  amLODipine (NORVASC) 10 MG tablet Take 1 tablet (10 mg total) by mouth daily.     Allergies:   Atorvastatin, Pravastatin sodium, and Statins   Social History   Socioeconomic History  . Marital status: Widowed    Spouse name: Not on file  . Number of children: 2  . Years of education: Not on file  . Highest education level: Not on file  Occupational History  . Occupation: Retired  Tobacco Use  . Smoking status: Former Smoker    Packs/day: 0.75    Years: 30.00    Pack years: 22.50    Quit date: 02/28/1970    Years since quitting: 49.8  . Smokeless tobacco: Never Used  Vaping Use  . Vaping Use: Never used  Substance and Sexual Activity  . Alcohol use: Yes    Alcohol/week: 0.0 standard drinks    Comment: 1-2 glasses wine/daily  . Drug use: No  . Sexual activity: Not on file  Other Topics Concern  . Not on file  Social History Narrative   Widowed 2010   2 adopted children   Has living will   Daughter is now health care power of attorney   Would accept resuscitation but no prolonged ventilation   No tube feeds if cognitively unaware   Social Determinants of Health   Financial Resource Strain:   . Difficulty of Paying Living Expenses: Not on file  Food Insecurity:   . Worried About Charity fundraiser in the Last Year: Not on file  . Ran Out of Food in the Last Year: Not on file  Transportation Needs:   . Lack of Transportation (Medical): Not on file  . Lack of Transportation (Non-Medical): Not on file  Physical Activity:   . Days of Exercise per Week: Not on file  . Minutes of Exercise per Session: Not on file  Stress:   . Feeling of Stress : Not on file  Social Connections:   . Frequency of Communication with Friends and Family: Not on file  . Frequency of Social Gatherings with Friends and Family: Not on file  . Attends Religious Services: Not on file  . Active Member of Clubs or Organizations: Not on file  . Attends Archivist Meetings: Not on file   . Marital Status: Not on file     Family History: The patient's family history includes Hypertension in his mother; Other in his father.  ROS:   Please see the history of present illness.     All other systems reviewed and are negative.  EKGs/Labs/Other Studies Reviewed:    The following studies were reviewed today:   EKG:  EKG is  ordered today.  The ekg ordered today demonstrates atrial fibrillation, heart rate 53  Recent Labs: 01/01/2020: ALT 21; B Natriuretic Peptide  634.0; TSH 3.231 01/03/2020: Magnesium 2.2 01/05/2020: BUN 40; Creatinine, Ser 1.55; Hemoglobin 11.5; Platelets 163; Potassium 4.8; Sodium 136  Recent Lipid Panel    Component Value Date/Time   CHOL 199 01/01/2020 2252   TRIG 70 01/01/2020 2252   HDL 37 (L) 01/01/2020 2252   CHOLHDL 5.4 01/01/2020 2252   VLDL 14 01/01/2020 2252   LDLCALC 148 (H) 01/01/2020 2252   LDLDIRECT 170.1 11/02/2012 0928     Risk Assessment/Calculations:      Physical Exam:    VS:  BP (!) 152/90 (BP Location: Right Arm, Patient Position: Sitting, Cuff Size: Normal)   Pulse (!) 53   Ht 5\' 6"  (1.676 m)   Wt 186 lb 6 oz (84.5 kg)   SpO2 95%   BMI 30.08 kg/m     Wt Readings from Last 3 Encounters:  01/06/20 186 lb 6 oz (84.5 kg)  01/02/20 178 lb 14.4 oz (81.1 kg)  12/04/19 182 lb (82.6 kg)     GEN: Well nourished, well developed in no acute distress, elderly gentleman HEENT: Normal, hard of hearing NECK: No JVD; No carotid bruits CARDIAC: Irregular irregular, 2/6 systolic murmur RESPIRATORY: Poor inspiratory effort ABDOMEN: Soft, non-tender, non-distended MUSCULOSKELETAL: 2+ edema; No deformity  SKIN: Warm and dry NEUROLOGIC: Alert and oriented x 3 PSYCHIATRIC: Normal affect   ASSESSMENT:    1. Persistent atrial fibrillation ([place])   2. Primary hypertension   3. Leg edema   4. Pure hypercholesterolemia    PLAN:    In order of problems listed above:  1. Persistent atrial fibrillation, CHA2DS2-VASc of 5  (age, TIA, htn).  Rate control off AV nodal agents.  Last echo showing preserved ejection fraction, moderate LVH, severely dilated LA.  Aortic sclerosis.  Continue Eliquis 2.5 mg twice daily.  We will see if we have some samples for patient to have while waiting for Eliquis to be delivered 2. History of hypertension, edema noted on exam, stop amlodipine, start losartan 50 mg daily.  Home BP check advised, keep BP log. 3. Lower extremity edema, stop amlodipine as above.  We will try to avoid diuretics due to recent acute on CKD noted while hospitalized.  Keeping legs elevated while in a seated position advised. 4. Hyperlipidemia, recommend low-cholesterol diet. will not introduce statin in this 86 year old gentleman.  10-year ASCVD risk not applicable.  Follow-up in 1 month for blood pressure and edema    Shared Decision Making/Informed Consent      Medication Adjustments/Labs and Tests Ordered: Current medicines are reviewed at length with the patient today.  Concerns regarding medicines are outlined above.  Orders Placed This Encounter  Procedures  . EKG 12-Lead   Meds ordered this encounter  Medications  . DISCONTD: losartan (COZAAR) 50 MG tablet    Sig: Take 0.5 tablets (25 mg total) by mouth daily.    Dispense:  90 tablet    Refill:  3  . losartan (COZAAR) 50 MG tablet    Sig: Take 1 tablet (50 mg total) by mouth daily.    Dispense:  90 tablet    Refill:  3    Patient Instructions  Medication Instructions:   Your physician has recommended you make the following change in your medication:   1.  STOP taking your amLODipine (NORVASC) 10 MG tablet. 2.  START taking losartan (COZAAR) 50 MG tablet: Take 1 tablet (50 mg total) by mouth daily. 3.  START taking apixaban (ELIQUIS) 2.5 MG TABS tablet: Take 1 tablet (2.5 mg total)  by mouth 2 (two) times daily.  *If you need a refill on your cardiac medications before your next appointment, please call your pharmacy*   Lab  Work: None  If you have labs (blood work) drawn today and your tests are completely normal, you will receive your results only by: Marland Kitchen MyChart Message (if you have MyChart) OR . A paper copy in the mail If you have any lab test that is abnormal or we need to change your treatment, we will call you to review the results.   Testing/Procedures: None Ordered   Follow-Up: At [facility], you and your health needs are our priority.  As part of our continuing mission to provide you with exceptional heart care, we have created designated Provider Care Teams.  These Care Teams include your primary Cardiologist (physician) and Advanced Practice Providers (APPs -  Physician Assistants and Nurse Practitioners) who all work together to provide you with the care you need, when you need it.  We recommend signing up for the patient portal called "MyChart".  Sign up information is provided on this After Visit Summary.  MyChart is used to connect with patients for Virtual Visits (Telemedicine).  Patients are able to view lab/test results, encounter notes, upcoming appointments, etc.  Non-urgent messages can be sent to your provider as well.   To learn more about what you can do with MyChart, go to [email or web address].    Your next appointment:   1 month(s)  The format for your next appointment:   In Person  Provider:   Kate Sable, MD   Other Instructions      Signed, Kate Sable, MD  01/06/2020 3:57 PM    [place]

## 2020-01-06 NOTE — Telephone Encounter (Signed)
Mr. Corker has hospital follow up scheduled on 01/14/2020.  Do we need to move his appointment up to this week?

## 2020-01-06 NOTE — Patient Instructions (Signed)
Medication Instructions:   Your physician has recommended you make the following change in your medication:   1.  STOP taking your amLODipine (NORVASC) 10 MG tablet. 2.  START taking losartan (COZAAR) 50 MG tablet: Take 1 tablet (50 mg total) by mouth daily. 3.  START taking apixaban (ELIQUIS) 2.5 MG TABS tablet: Take 1 tablet (2.5 mg total) by mouth 2 (two) times daily.  *If you need a refill on your cardiac medications before your next appointment, please call your pharmacy*   Lab Work: None  If you have labs (blood work) drawn today and your tests are completely normal, you will receive your results only by: Marland Kitchen MyChart Message (if you have MyChart) OR . A paper copy in the mail If you have any lab test that is abnormal or we need to change your treatment, we will call you to review the results.   Testing/Procedures: None Ordered   Follow-Up: At [facility], you and your health needs are our priority.  As part of our continuing mission to provide you with exceptional heart care, we have created designated Provider Care Teams.  These Care Teams include your primary Cardiologist (physician) and Advanced Practice Providers (APPs -  Physician Assistants and Nurse Practitioners) who all work together to provide you with the care you need, when you need it.  We recommend signing up for the patient portal called "MyChart".  Sign up information is provided on this After Visit Summary.  MyChart is used to connect with patients for Virtual Visits (Telemedicine).  Patients are able to view lab/test results, encounter notes, upcoming appointments, etc.  Non-urgent messages can be sent to your provider as well.   To learn more about what you can do with MyChart, go to [email or web address].    Your next appointment:   1 month(s)  The format for your next appointment:   In Person  Provider:   Kate Sable, MD   Other Instructions

## 2020-01-06 NOTE — Telephone Encounter (Signed)
Please change him to 1:45PM on Thursday and block the 2:15--if he can make it

## 2020-01-06 NOTE — Telephone Encounter (Signed)
Transition Care Management Follow-up Telephone Call  Date of discharge and from where: 01/05/2020, [facility]  How have you been since you were released from the hospital? Patient states that he is doing fine. Still some fatigue noted.   Any questions or concerns? No  Items Reviewed:  Did the pt receive and understand the discharge instructions provided? Yes   Medications obtained and verified? Yes   Other? No   Any new allergies since your discharge? No  Dietary orders reviewed? Yes  Do you have support at home? Yes   Home Care and Equipment/Supplies: Were home health services ordered? not applicable If so, what is the name of the agency? N/A  Has the agency set up a time to come to the patient's home? not applicable Were any new equipment or medical supplies ordered?  No What is the name of the medical supply agency? N/A Were you able to get the supplies/equipment? not applicable Do you have any questions related to the use of the equipment or supplies? No  Functional Questionnaire: (I = Independent and D = Dependent) ADLs: I  Bathing/Dressing- I  Meal Prep- I  Eating- I  Maintaining continence- I  Transferring/Ambulation- I  Managing Meds- I  Follow up appointments reviewed:   PCP Hospital f/u appt confirmed? Yes  Scheduled to see Dr. Silvio Pate on 01/14/2020 @ 11 am.  [facility] f/u appt confirmed? Yes  saw cardiologist today.  Are transportation arrangements needed? No   If their condition worsens, is the pt aware to call PCP or go to the Emergency Dept.? Yes  Was the patient provided with contact information for the PCP's office or ED? Yes  Was to pt encouraged to call back with questions or concerns? Yes

## 2020-01-07 ENCOUNTER — Telehealth: Payer: Self-pay

## 2020-01-07 ENCOUNTER — Telehealth: Payer: Self-pay | Admitting: Internal Medicine

## 2020-01-07 NOTE — Telephone Encounter (Signed)
disregard the message his daughter set up pt from an outside source

## 2020-01-07 NOTE — Telephone Encounter (Signed)
He was just in the hospital and they may have made a referral from there. Is she confirming orders or is asking for home PT that hasn't been ordered yet? If so, I will have to wait till I see him

## 2020-01-07 NOTE — Telephone Encounter (Signed)
Patient returning call.

## 2020-01-07 NOTE — Telephone Encounter (Signed)
Spoke with patient and he stated that he is having pain in his back that he believes is related to the constipation that developed in his recent hospital stay. Patient stated that his daughter set up an appointment for him tomorrow sometime. He wasn't sure when, but that someone was going to come to check on him at home.  Patient was grateful for the call back.

## 2020-01-07 NOTE — Telephone Encounter (Signed)
Pt called in wanted to know about the PT at [facility] and wanted to get it started immediately.

## 2020-01-07 NOTE — Telephone Encounter (Signed)
Lynnea Ferrier @ nurse care manger @  [facility]  Call wanting you to asses pt for home health PT for leg strengthen  Pt has appointment 11/16

## 2020-01-08 NOTE — Telephone Encounter (Signed)
Left detailed message for [facility]. Advised her what Dr Silvio Pate said.

## 2020-01-10 ENCOUNTER — Telehealth: Payer: Self-pay | Admitting: Internal Medicine

## 2020-01-10 ENCOUNTER — Telehealth: Payer: Self-pay | Admitting: *Deleted

## 2020-01-10 DIAGNOSIS — Z23 Encounter for immunization: Secondary | ICD-10-CM | POA: Diagnosis not present

## 2020-01-10 NOTE — Telephone Encounter (Signed)
Angie calling in from [facility] needing order for skilled nursing and PT training  due to onside A-fib and medication management and disease management.    Please advise   Fax number: [phone]

## 2020-01-10 NOTE — Telephone Encounter (Signed)
Verbal orders given to [place].

## 2020-01-10 NOTE — Telephone Encounter (Signed)
[name] with [facility] left a voicemail stating that she was calling to report a fall. [name] stated that patient fell over an ottoman in his home. [name] stated that when they were in the home the patient had a laceration to his nose. [name] stated that patient was unattended and daughter is aware of the fall. [name] stated to call back if you have any questions.Stephen Garrett

## 2020-01-10 NOTE — Telephone Encounter (Signed)
Stephen Garrett He has hospital follow up next week

## 2020-01-10 NOTE — Telephone Encounter (Signed)
Noted I would not be surprised if he has quite a few unreported falls Glad he is getting PT evaluation

## 2020-01-14 ENCOUNTER — Encounter: Payer: Self-pay | Admitting: Internal Medicine

## 2020-01-14 ENCOUNTER — Other Ambulatory Visit: Payer: Self-pay

## 2020-01-14 ENCOUNTER — Ambulatory Visit (INDEPENDENT_AMBULATORY_CARE_PROVIDER_SITE_OTHER): Payer: Medicare Other | Admitting: Internal Medicine

## 2020-01-14 VITALS — BP 132/70 | HR 60 | Temp 96.7°F | Ht 66.0 in | Wt 193.0 lb

## 2020-01-14 DIAGNOSIS — I4819 Other persistent atrial fibrillation: Secondary | ICD-10-CM | POA: Diagnosis not present

## 2020-01-14 DIAGNOSIS — N1832 Chronic kidney disease, stage 3b: Secondary | ICD-10-CM

## 2020-01-14 DIAGNOSIS — I5031 Acute diastolic (congestive) heart failure: Secondary | ICD-10-CM | POA: Diagnosis not present

## 2020-01-14 DIAGNOSIS — I10 Essential (primary) hypertension: Secondary | ICD-10-CM | POA: Diagnosis not present

## 2020-01-14 DIAGNOSIS — I5032 Chronic diastolic (congestive) heart failure: Secondary | ICD-10-CM | POA: Insufficient documentation

## 2020-01-14 MED ORDER — FUROSEMIDE 40 MG PO TABS: 40.0000 mg | ORAL_TABLET | Freq: Every day | ORAL | 3 refills | Status: DC

## 2020-01-14 NOTE — Assessment & Plan Note (Signed)
BP Readings from Last 3 Encounters:  01/14/20 132/70  01/06/20 (!) 152/90  01/05/20 (!) 153/77   Was hypotensive on presentation---okay now without the amlodipine On losartan

## 2020-01-14 NOTE — Assessment & Plan Note (Signed)
GFR in 20's but better after fluids Might have been more cardiorenal than low volume Will recheck after a few days of furosemide

## 2020-01-14 NOTE — Assessment & Plan Note (Signed)
Has had chronic venous insufficiency but now has severe edema and notes DOE that is new for him. This is likely related to the new atrial fib He also got IV fluids in the hospital due to AKI---and may be using salt Will start furosemide daily Continue losartan (will need to confirm he is back on this with med reconciliation from his actual pill bottles)

## 2020-01-14 NOTE — Progress Notes (Signed)
Subjective:    Patient ID: Stephen Garrett, male    DOB: [birth year], 86 y.o.   MRN: [id number]  HPI Here for hospital follow up This visit occurred during the SARS-CoV-2 public health emergency.  Safety protocols were in place, including screening questions prior to the visit, additional usage of staff PPE, and extensive cleaning of exam room while observing appropriate contact time as indicated for disinfecting solutions.   Reviewed hospital records, discharge summary and recent cardiology follow up  He fell off a chair one night Next day---he was playing cards with friends and he fell asleep while sitting BP 80/60 by nurse EMS activated and to ER Noted to be in atrial fib. Troponins up--felt to be demand Had AKI on top of CKD----losartan and HCTZ stopped Echo showed good LV function but very enlarged LA  Went to Dr Garen Lah after discharge---stopped amlodipine due to edema Swelling has now gotten worse Breathing is generally okay--but gets DOE with minimal exertion Sleeps in bed and chair---keeps head on one pillow. No PND Some chest pain---mostly on right side (like all over)----mostly gone now No palpitations  Will be starting PT at home due to weakness, etc  Current Outpatient Medications on File Prior to Visit  Medication Sig Dispense Refill  . apixaban (ELIQUIS) 2.5 MG TABS tablet Take 1 tablet (2.5 mg total) by mouth 2 (two) times daily. 60 tablet 1  . buPROPion (WELLBUTRIN XL) 150 MG 24 hr tablet TAKE 1 TABLET BY MOUTH DAILY (Patient taking differently: Take 150 mg by mouth daily. ) 90 tablet 3  . clonazePAM (KLONOPIN) 0.5 MG tablet TAKE ONE TO TWO TABLETS AT BEDTIME (Patient taking differently: Take 0.5-1 mg by mouth at bedtime. ) 180 tablet 0  . finasteride (PROSCAR) 5 MG tablet TAKE ONE TABLET BY MOUTH EVERY DAY (Patient taking differently: Take 5 mg by mouth daily. ) 90 tablet 3  . losartan (COZAAR) 50 MG tablet Take 1 tablet (50 mg total) by mouth daily. 90 tablet 3   . Multiple Vitamin (MULTIVITAMIN) capsule Take 1 capsule by mouth daily.      Marland Kitchen omeprazole (PRILOSEC) 40 MG capsule TAKE 1 CAPSULE BY MOUTH ONCE DAILY (Patient taking differently: Take 40 mg by mouth daily. ) 90 capsule 3  . polyethylene glycol (MIRALAX / GLYCOLAX) packet Take 17 g by mouth daily.    . pramipexole (MIRAPEX) 1 MG tablet TAKE ONE TABLET AT DINNER AND ONE TABLETAT BEDTIME (Patient taking differently: Take 1 mg by mouth 2 (two) times daily. TAKE ONE TABLET AT DINNER AND ONE TABLETAT BEDTIME) 180 tablet 3  . pyridoxine (B-6) 100 MG tablet Take 100 mg by mouth daily.    . tamsulosin (FLOMAX) 0.4 MG CAPS capsule TAKE 1 CAPSULE EVERY DAY (Patient taking differently: Take 0.4 mg by mouth daily. ) 90 capsule 3  . [DISCONTINUED] citalopram (CELEXA) 10 MG tablet Take 1 tablet (10 mg total) by mouth daily. 30 tablet 5   No current facility-administered medications on file prior to visit.    Allergies  Allergen Reactions  . Atorvastatin     REACTION: raises blood pressure  . Pravastatin Sodium     REACTION: raises blood pressure  . Statins     REACTION: raises bp    Past Medical History:  Diagnosis Date  . Actinic keratosis   . BPH (benign prostatic hypertrophy)   . Diseases of lips   . Diverticulosis of colon 06/2003  . ED (erectile dysfunction)   . GERD (gastroesophageal reflux disease)   .  Heart murmur    as child  . HLD (hyperlipidemia)   . HOH (hard of hearing)    Cochlear implant right, hearing aid left  . HTN (hypertension)   . Numbness and tingling in both hands    intermittent, trying braces   . Osteoarthritis of knee    severe, bilat  . Renal insufficiency   . Restless legs syndrome (RLS)   . Sleep disturbance, unspecified   . Spinal stenosis, lumbar region, without neurogenic claudication     Past Surgical History:  Procedure Laterality Date  . CATARACT EXTRACTION, BILATERAL  2011   Dr. Thomasene Ripple  . COCHLEAR IMPLANT Right 02/15/2017   Procedure: RIGHT  COCHLEAR IMPLANT;  Surgeon: Vicie Mutters, MD;  Location: [place];  Service: ENT;  Laterality: Right;  . FINGER SURGERY     Right 4th finger  . [facility]  10/2009   Dr. Bary Castilla  . LUMBAR SPINE SURGERY  06/2005   stenosis repair  . PTOSIS REPAIR Bilateral 04/19/2019   Procedure: BLEPHAROPTOSIS REPAIR; RESECT SKIN;  Surgeon: Karle Starch, MD;  Location: [place];  Service: Ophthalmology;  Laterality: Bilateral;  . TONSILLECTOMY  childhood  . TOTAL KNEE ARTHROPLASTY  06/2008   bilat (Dr. Marry Guan)    Family History  Problem Relation Age of Onset  . Other Father        "clogged arteries"  . Hypertension Mother     Social History   Socioeconomic History  . Marital status: Widowed    Spouse name: Not on file  . Number of children: 2  . Years of education: Not on file  . Highest education level: Not on file  Occupational History  . Occupation: Retired  Tobacco Use  . Smoking status: Former Smoker    Packs/day: 0.75    Years: 30.00    Pack years: 22.50    Quit date: 02/28/1970    Years since quitting: 49.9  . Smokeless tobacco: Never Used  Vaping Use  . Vaping Use: Never used  Substance and Sexual Activity  . Alcohol use: Yes    Alcohol/week: 0.0 standard drinks    Comment: 1-2 glasses wine/daily  . Drug use: No  . Sexual activity: Not on file  Other Topics Concern  . Not on file  Social History Narrative   Widowed 2010   2 adopted children   Has living will   Daughter is now health care power of attorney   Would accept resuscitation but no prolonged ventilation   No tube feeds if cognitively unaware   Social Determinants of Health   Financial Resource Strain:   . Difficulty of Paying Living Expenses: Not on file  Food Insecurity:   . Worried About Charity fundraiser in the Last Year: Not on file  . Ran Out of Food in the Last Year: Not on file  Transportation Needs:   . Lack of Transportation (Medical): Not on file  . Lack of Transportation  (Non-Medical): Not on file  Physical Activity:   . Days of Exercise per Week: Not on file  . Minutes of Exercise per Session: Not on file  Stress:   . Feeling of Stress : Not on file  Social Connections:   . Frequency of Communication with Friends and Family: Not on file  . Frequency of Social Gatherings with Friends and Family: Not on file  . Attends Religious Services: Not on file  . Active Member of Clubs or Organizations: Not on file  .  Attends Archivist Meetings: Not on file  . Marital Status: Not on file  Intimate Partner Violence:   . Fear of Current or Ex-Partner: Not on file  . Emotionally Abused: Not on file  . Physically Abused: Not on file  . Sexually Abused: Not on file   Review of Systems Was constipated for 4 days ---had to strain and he relates current muscle soreness to this Appetite is good Weight is up 11# from pre hospital weight Tripped at home--on bedcover that was on floor. Hit head but got up on his own    Objective:   Physical Exam Constitutional:      Appearance: Normal appearance.  Cardiovascular:     Rate and Rhythm: Normal rate. Rhythm irregular.     Heart sounds: No murmur heard.  No gallop.   Pulmonary:     Effort: Pulmonary effort is normal.     Breath sounds: Normal breath sounds. No wheezing or rales.  Abdominal:     Palpations: Abdomen is soft.     Tenderness: There is no abdominal tenderness.  Musculoskeletal:     Cervical back: Neck supple.     Comments: 2-3+ tense edema in calves--with weeping on left Some edema in forearms as well  Lymphadenopathy:     Cervical: No cervical adenopathy.  Neurological:     Mental Status: He is alert.            Assessment & Plan:

## 2020-01-14 NOTE — Patient Instructions (Signed)
Please start the furosemide every day---as soon as you pick it up today and then every morning. Weigh yourself every morning--bring in the list for me Bring in all your medication bottles next time so we can review what you are on. Don't use any salt!!

## 2020-01-14 NOTE — Assessment & Plan Note (Signed)
New diagnosis Given the dilated LA, no attempt planned to cardiovert Is on the eliquis

## 2020-01-17 ENCOUNTER — Ambulatory Visit (INDEPENDENT_AMBULATORY_CARE_PROVIDER_SITE_OTHER): Payer: Medicare Other | Admitting: Internal Medicine

## 2020-01-17 ENCOUNTER — Encounter: Payer: Self-pay | Admitting: Internal Medicine

## 2020-01-17 ENCOUNTER — Other Ambulatory Visit: Payer: Self-pay

## 2020-01-17 VITALS — BP 126/68 | HR 80 | Temp 97.0°F | Ht 66.0 in | Wt 187.0 lb

## 2020-01-17 DIAGNOSIS — I4819 Other persistent atrial fibrillation: Secondary | ICD-10-CM

## 2020-01-17 DIAGNOSIS — N1832 Chronic kidney disease, stage 3b: Secondary | ICD-10-CM

## 2020-01-17 DIAGNOSIS — I5032 Chronic diastolic (congestive) heart failure: Secondary | ICD-10-CM

## 2020-01-17 NOTE — Progress Notes (Signed)
Subjective:    Patient ID: Stephen Garrett, male    DOB: Aug 02, 1923, 86 y.o.   MRN: [id number]  HPI Here for follow up of CHF This visit occurred during the SARS-CoV-2 public health emergency.  Safety protocols were in place, including screening questions prior to the visit, additional usage of staff PPE, and extensive cleaning of exam room while observing appropriate contact time as indicated for disinfecting solutions.   Has started the furosemide He has been urinating a lot---with occasional mild incontinence Still with some edema Breathing is okay--he feels it is "normal" Working with the PT---doing home exercise as well Sleeps in chair---no PND. Can't get in and out of bed comfortably  Medicine reconciliation done He had not stopped the amlodipine  Current Outpatient Medications on File Prior to Visit  Medication Sig Dispense Refill  . amLODipine (NORVASC) 10 MG tablet Take 10 mg by mouth daily.    Marland Kitchen apixaban (ELIQUIS) 2.5 MG TABS tablet Take 1 tablet (2.5 mg total) by mouth 2 (two) times daily. 60 tablet 1  . buPROPion (WELLBUTRIN XL) 150 MG 24 hr tablet TAKE 1 TABLET BY MOUTH DAILY (Patient taking differently: Take 150 mg by mouth daily. ) 90 tablet 3  . clonazePAM (KLONOPIN) 0.5 MG tablet TAKE ONE TO TWO TABLETS AT BEDTIME (Patient taking differently: Take 0.5-1 mg by mouth at bedtime. ) 180 tablet 0  . finasteride (PROSCAR) 5 MG tablet TAKE ONE TABLET BY MOUTH EVERY DAY (Patient taking differently: Take 5 mg by mouth daily. ) 90 tablet 3  . furosemide (LASIX) 40 MG tablet Take 1 tablet (40 mg total) by mouth daily. 30 tablet 3  . losartan (COZAAR) 50 MG tablet Take 1 tablet (50 mg total) by mouth daily. 90 tablet 3  . Multiple Vitamin (MULTIVITAMIN) capsule Take 1 capsule by mouth daily.      Marland Kitchen omeprazole (PRILOSEC) 40 MG capsule TAKE 1 CAPSULE BY MOUTH ONCE DAILY (Patient taking differently: Take 40 mg by mouth daily. ) 90 capsule 3  . polyethylene glycol (MIRALAX / GLYCOLAX)  packet Take 17 g by mouth daily.    . pramipexole (MIRAPEX) 1 MG tablet TAKE ONE TABLET AT DINNER AND ONE TABLETAT BEDTIME (Patient taking differently: Take 1 mg by mouth 2 (two) times daily. TAKE ONE TABLET AT DINNER AND ONE TABLETAT BEDTIME) 180 tablet 3  . tamsulosin (FLOMAX) 0.4 MG CAPS capsule TAKE 1 CAPSULE EVERY DAY (Patient taking differently: Take 0.4 mg by mouth daily. ) 90 capsule 3  . pyridoxine (B-6) 100 MG tablet Take 100 mg by mouth daily.    . [DISCONTINUED] citalopram (CELEXA) 10 MG tablet Take 1 tablet (10 mg total) by mouth daily. 30 tablet 5   No current facility-administered medications on file prior to visit.    Allergies  Allergen Reactions  . Atorvastatin     REACTION: raises blood pressure  . Pravastatin Sodium     REACTION: raises blood pressure  . Statins     REACTION: raises bp    Past Medical History:  Diagnosis Date  . Actinic keratosis   . BPH (benign prostatic hypertrophy)   . Diseases of lips   . Diverticulosis of colon 06/2003  . ED (erectile dysfunction)   . GERD (gastroesophageal reflux disease)   . Heart murmur    as child  . HLD (hyperlipidemia)   . HOH (hard of hearing)    Cochlear implant right, hearing aid left  . HTN (hypertension)   . Numbness and tingling  in both hands    intermittent, trying braces   . Osteoarthritis of knee    severe, bilat  . Renal insufficiency   . Restless legs syndrome (RLS)   . Sleep disturbance, unspecified   . Spinal stenosis, lumbar region, without neurogenic claudication     Past Surgical History:  Procedure Laterality Date  . CATARACT EXTRACTION, BILATERAL  2011   Dr. Thomasene Ripple  . COCHLEAR IMPLANT Right 02/15/2017   Procedure: RIGHT COCHLEAR IMPLANT;  Surgeon: Vicie Mutters, MD;  Location: [place];  Service: ENT;  Laterality: Right;  . FINGER SURGERY     Right 4th finger  . [facility]  10/2009   Dr. Bary Castilla  . LUMBAR SPINE SURGERY  06/2005   stenosis repair  . PTOSIS REPAIR Bilateral  04/19/2019   Procedure: BLEPHAROPTOSIS REPAIR; RESECT SKIN;  Surgeon: Karle Starch, MD;  Location: [place];  Service: Ophthalmology;  Laterality: Bilateral;  . TONSILLECTOMY  childhood  . TOTAL KNEE ARTHROPLASTY  06/2008   bilat (Dr. Marry Guan)    Family History  Problem Relation Age of Onset  . Other Father        "clogged arteries"  . Hypertension Mother     Social History   Socioeconomic History  . Marital status: Widowed    Spouse name: Not on file  . Number of children: 2  . Years of education: Not on file  . Highest education level: Not on file  Occupational History  . Occupation: Retired  Tobacco Use  . Smoking status: Former Smoker    Packs/day: 0.75    Years: 30.00    Pack years: 22.50    Quit date: 02/28/1970    Years since quitting: 49.9  . Smokeless tobacco: Never Used  Vaping Use  . Vaping Use: Never used  Substance and Sexual Activity  . Alcohol use: Yes    Alcohol/week: 0.0 standard drinks    Comment: 1-2 glasses wine/daily  . Drug use: No  . Sexual activity: Not on file  Other Topics Concern  . Not on file  Social History Narrative   Widowed 2010   2 adopted children   Has living will   Daughter is now health care power of attorney   Would accept resuscitation but no prolonged ventilation   No tube feeds if cognitively unaware   Social Determinants of Health   Financial Resource Strain:   . Difficulty of Paying Living Expenses: Not on file  Food Insecurity:   . Worried About Charity fundraiser in the Last Year: Not on file  . Ran Out of Food in the Last Year: Not on file  Transportation Needs:   . Lack of Transportation (Medical): Not on file  . Lack of Transportation (Non-Medical): Not on file  Physical Activity:   . Days of Exercise per Week: Not on file  . Minutes of Exercise per Session: Not on file  Stress:   . Feeling of Stress : Not on file  Social Connections:   . Frequency of Communication with Friends and Family: Not  on file  . Frequency of Social Gatherings with Friends and Family: Not on file  . Attends Religious Services: Not on file  . Active Member of Clubs or Organizations: Not on file  . Attends Archivist Meetings: Not on file  . Marital Status: Not on file  Intimate Partner Violence:   . Fear of Current or Ex-Partner: Not on file  . Emotionally Abused: Not on file  .  Physically Abused: Not on file  . Sexually Abused: Not on file   Review of Systems Appetite is good Weight down 6# since 3 days ago    Objective:   Physical Exam Constitutional:      Appearance: Normal appearance.  Cardiovascular:     Rate and Rhythm: Normal rate. Rhythm irregular.     Heart sounds: No gallop.      Comments: Soft systolic murmur Pulmonary:     Effort: Pulmonary effort is normal.     Breath sounds: Normal breath sounds. No wheezing or rales.  Musculoskeletal:     Cervical back: Neck supple.     Comments: 2+ calf edema with seeping on left---but much less tight  Lymphadenopathy:     Cervical: No cervical adenopathy.  Neurological:     Mental Status: He is alert.            Assessment & Plan:

## 2020-01-17 NOTE — Assessment & Plan Note (Signed)
Expect this to be permanent On the eliquis

## 2020-01-17 NOTE — Assessment & Plan Note (Signed)
I will reheck his labs on ARB and furosemide at 2 week follow up

## 2020-01-17 NOTE — Assessment & Plan Note (Signed)
Improved on the furosemide Will have him stop the amlodipine that he was supposed to be off This should help his edema

## 2020-01-17 NOTE — Patient Instructions (Signed)
Please stop the amlodipine

## 2020-01-20 DIAGNOSIS — Z9181 History of falling: Secondary | ICD-10-CM

## 2020-01-20 DIAGNOSIS — I872 Venous insufficiency (chronic) (peripheral): Secondary | ICD-10-CM

## 2020-01-20 DIAGNOSIS — M48061 Spinal stenosis, lumbar region without neurogenic claudication: Secondary | ICD-10-CM

## 2020-01-20 DIAGNOSIS — Z8673 Personal history of transient ischemic attack (TIA), and cerebral infarction without residual deficits: Secondary | ICD-10-CM

## 2020-01-20 DIAGNOSIS — N183 Chronic kidney disease, stage 3 unspecified: Secondary | ICD-10-CM

## 2020-01-20 DIAGNOSIS — Z87891 Personal history of nicotine dependence: Secondary | ICD-10-CM

## 2020-01-20 DIAGNOSIS — F32A Depression, unspecified: Secondary | ICD-10-CM

## 2020-01-20 DIAGNOSIS — Z7901 Long term (current) use of anticoagulants: Secondary | ICD-10-CM

## 2020-01-20 DIAGNOSIS — I214 Non-ST elevation (NSTEMI) myocardial infarction: Secondary | ICD-10-CM

## 2020-01-20 DIAGNOSIS — I4891 Unspecified atrial fibrillation: Secondary | ICD-10-CM

## 2020-01-20 DIAGNOSIS — K219 Gastro-esophageal reflux disease without esophagitis: Secondary | ICD-10-CM

## 2020-01-20 DIAGNOSIS — Z96653 Presence of artificial knee joint, bilateral: Secondary | ICD-10-CM

## 2020-01-20 DIAGNOSIS — Z7952 Long term (current) use of systemic steroids: Secondary | ICD-10-CM

## 2020-01-20 DIAGNOSIS — N400 Enlarged prostate without lower urinary tract symptoms: Secondary | ICD-10-CM

## 2020-01-20 DIAGNOSIS — I129 Hypertensive chronic kidney disease with stage 1 through stage 4 chronic kidney disease, or unspecified chronic kidney disease: Secondary | ICD-10-CM

## 2020-01-29 ENCOUNTER — Other Ambulatory Visit: Payer: Self-pay

## 2020-01-29 ENCOUNTER — Ambulatory Visit (INDEPENDENT_AMBULATORY_CARE_PROVIDER_SITE_OTHER): Payer: Medicare Other | Admitting: Internal Medicine

## 2020-01-29 ENCOUNTER — Encounter: Payer: Self-pay | Admitting: Internal Medicine

## 2020-01-29 VITALS — BP 124/84 | HR 66 | Temp 97.4°F | Ht 66.0 in | Wt 185.0 lb

## 2020-01-29 DIAGNOSIS — I4819 Other persistent atrial fibrillation: Secondary | ICD-10-CM

## 2020-01-29 DIAGNOSIS — I5032 Chronic diastolic (congestive) heart failure: Secondary | ICD-10-CM | POA: Diagnosis not present

## 2020-01-29 DIAGNOSIS — N401 Enlarged prostate with lower urinary tract symptoms: Secondary | ICD-10-CM | POA: Diagnosis not present

## 2020-01-29 DIAGNOSIS — N138 Other obstructive and reflux uropathy: Secondary | ICD-10-CM | POA: Diagnosis not present

## 2020-01-29 MED ORDER — FUROSEMIDE 80 MG PO TABS: 80.0000 mg | ORAL_TABLET | Freq: Every day | ORAL | 11 refills | Status: DC

## 2020-01-29 MED ORDER — METOLAZONE 2.5 MG PO TABS: 2.5000 mg | ORAL_TABLET | Freq: Every day | ORAL | 3 refills | Status: DC | PRN

## 2020-01-29 NOTE — Progress Notes (Signed)
Subjective:    Patient ID: Stephen Garrett, male    DOB: [birth year], 86 y.o.   MRN: [id number]  HPI Here for follow up of CHF This visit occurred during the SARS-CoV-2 public health emergency.  Safety protocols were in place, including screening questions prior to the visit, additional usage of staff PPE, and extensive cleaning of exam room while observing appropriate contact time as indicated for disinfecting solutions.   Now with swollen scrotum---"huge with fluid coming out" Leg swelling is about the same---still some seeping from the left Did increase the furosemide to 80mg  starting yesterday afternoon Hasn't been weighing himself---discussed  Has substernal pain when he coughs No other chest pain No SOB Sleeps in new chair---fairly flat. No PND  Current Outpatient Medications on File Prior to Visit  Medication Sig Dispense Refill  . apixaban (ELIQUIS) 2.5 MG TABS tablet Take 1 tablet (2.5 mg total) by mouth 2 (two) times daily. 60 tablet 1  . buPROPion (WELLBUTRIN XL) 150 MG 24 hr tablet TAKE 1 TABLET BY MOUTH DAILY (Patient taking differently: Take 150 mg by mouth daily. ) 90 tablet 3  . clonazePAM (KLONOPIN) 0.5 MG tablet TAKE ONE TO TWO TABLETS AT BEDTIME (Patient taking differently: Take 0.5-1 mg by mouth at bedtime. ) 180 tablet 0  . finasteride (PROSCAR) 5 MG tablet TAKE ONE TABLET BY MOUTH EVERY DAY (Patient taking differently: Take 5 mg by mouth daily. ) 90 tablet 3  . furosemide (LASIX) 40 MG tablet Take 1 tablet (40 mg total) by mouth daily. (Patient taking differently: Take 80 mg by mouth daily. ) 30 tablet 3  . losartan (COZAAR) 50 MG tablet Take 1 tablet (50 mg total) by mouth daily. 90 tablet 3  . Multiple Vitamin (MULTIVITAMIN) capsule Take 1 capsule by mouth daily.      Marland Kitchen omeprazole (PRILOSEC) 40 MG capsule TAKE 1 CAPSULE BY MOUTH ONCE DAILY (Patient taking differently: Take 40 mg by mouth daily. ) 90 capsule 3  . polyethylene glycol (MIRALAX / GLYCOLAX) packet Take  17 g by mouth daily.    . pramipexole (MIRAPEX) 1 MG tablet TAKE ONE TABLET AT DINNER AND ONE TABLETAT BEDTIME (Patient taking differently: Take 1 mg by mouth 2 (two) times daily. TAKE ONE TABLET AT DINNER AND ONE TABLETAT BEDTIME) 180 tablet 3  . pyridoxine (B-6) 100 MG tablet Take 100 mg by mouth daily.    . tamsulosin (FLOMAX) 0.4 MG CAPS capsule TAKE 1 CAPSULE EVERY DAY (Patient taking differently: Take 0.4 mg by mouth daily. ) 90 capsule 3  . [DISCONTINUED] citalopram (CELEXA) 10 MG tablet Take 1 tablet (10 mg total) by mouth daily. 30 tablet 5   No current facility-administered medications on file prior to visit.    Allergies  Allergen Reactions  . Atorvastatin     REACTION: raises blood pressure  . Pravastatin Sodium     REACTION: raises blood pressure  . Statins     REACTION: raises bp    Past Medical History:  Diagnosis Date  . Actinic keratosis   . BPH (benign prostatic hypertrophy)   . Diseases of lips   . Diverticulosis of colon 06/2003  . ED (erectile dysfunction)   . GERD (gastroesophageal reflux disease)   . Heart murmur    as child  . HLD (hyperlipidemia)   . HOH (hard of hearing)    Cochlear implant right, hearing aid left  . HTN (hypertension)   . Numbness and tingling in both hands    intermittent,  trying braces   . Osteoarthritis of knee    severe, bilat  . Renal insufficiency   . Restless legs syndrome (RLS)   . Sleep disturbance, unspecified   . Spinal stenosis, lumbar region, without neurogenic claudication     Past Surgical History:  Procedure Laterality Date  . CATARACT EXTRACTION, BILATERAL  2011   Dr. Thomasene Ripple  . COCHLEAR IMPLANT Right 02/15/2017   Procedure: RIGHT COCHLEAR IMPLANT;  Surgeon: Vicie Mutters, MD;  Location: [place];  Service: ENT;  Laterality: Right;  . FINGER SURGERY     Right 4th finger  . [facility]  10/2009   Dr. Bary Castilla  . LUMBAR SPINE SURGERY  06/2005   stenosis repair  . PTOSIS REPAIR Bilateral 04/19/2019     Procedure: BLEPHAROPTOSIS REPAIR; RESECT SKIN;  Surgeon: Karle Starch, MD;  Location: [place];  Service: Ophthalmology;  Laterality: Bilateral;  . TONSILLECTOMY  childhood  . TOTAL KNEE ARTHROPLASTY  06/2008   bilat (Dr. Marry Guan)    Family History  Problem Relation Age of Onset  . Other Father        "clogged arteries"  . Hypertension Mother     Social History   Socioeconomic History  . Marital status: Widowed    Spouse name: Not on file  . Number of children: 2  . Years of education: Not on file  . Highest education level: Not on file  Occupational History  . Occupation: Retired  Tobacco Use  . Smoking status: Former Smoker    Packs/day: 0.75    Years: 30.00    Pack years: 22.50    Quit date: 02/28/1970    Years since quitting: 49.9  . Smokeless tobacco: Never Used  Vaping Use  . Vaping Use: Never used  Substance and Sexual Activity  . Alcohol use: Yes    Alcohol/week: 0.0 standard drinks    Comment: 1-2 glasses wine/daily  . Drug use: No  . Sexual activity: Not on file  Other Topics Concern  . Not on file  Social History Narrative   Widowed 2010   2 adopted children   Has living will   Daughter is now health care power of attorney   Would accept resuscitation but no prolonged ventilation   No tube feeds if cognitively unaware   Social Determinants of Health   Financial Resource Strain:   . Difficulty of Paying Living Expenses: Not on file  Food Insecurity:   . Worried About Charity fundraiser in the Last Year: Not on file  . Ran Out of Food in the Last Year: Not on file  Transportation Needs:   . Lack of Transportation (Medical): Not on file  . Lack of Transportation (Non-Medical): Not on file  Physical Activity:   . Days of Exercise per Week: Not on file  . Minutes of Exercise per Session: Not on file  Stress:   . Feeling of Stress : Not on file  Social Connections:   . Frequency of Communication with Friends and Family: Not on file  .  Frequency of Social Gatherings with Friends and Family: Not on file  . Attends Religious Services: Not on file  . Active Member of Clubs or Organizations: Not on file  . Attends Archivist Meetings: Not on file  . Marital Status: Not on file  Intimate Partner Violence:   . Fear of Current or Ex-Partner: Not on file  . Emotionally Abused: Not on file  . Physically Abused: Not on  file  . Sexually Abused: Not on file   Review of Systems Appetite is fine Weight down only slightly    Objective:   Physical Exam Cardiovascular:     Rate and Rhythm: Normal rate. Rhythm irregular.     Heart sounds: No murmur heard.  No gallop.   Pulmonary:     Effort: Pulmonary effort is normal.     Breath sounds: Normal breath sounds. No wheezing or rales.     Comments: Slight tenderness just below xiphoid Genitourinary:    Comments: Moderate scrotal swelling ---transilluminates Musculoskeletal:     Cervical back: Neck supple.     Comments: 2-3+ tense edema bilaterally ---still more on left. Slight dampness on both  Lymphadenopathy:     Cervical: No cervical adenopathy.            Assessment & Plan:

## 2020-01-29 NOTE — Assessment & Plan Note (Signed)
Persistent Rate is fine without Rx Is on the eliquis

## 2020-01-29 NOTE — Patient Instructions (Signed)
Please continue the furosemide 80mg  daily. I have sent a new prescription. You must weigh yourself every day first thing in the morning. Bring in the measurements of the daily weight at your visits Start the new medication---metolazone 2.5mg  daily along with the furosemide. You can hold off on this if you lose 10# from your current weight or all the swelling is gone from your scrotum and legs. We will discuss dosing this at your visit next week.

## 2020-01-29 NOTE — Assessment & Plan Note (Signed)
Urine flow is better Continues on the finasteride

## 2020-01-29 NOTE — Assessment & Plan Note (Addendum)
Still with ongoing fluid overload Will continue the furosemide 80mg  daily Add zaroxlyn 2.5mg  daily till weight back down Will recheck next week---with renal profile then (last potassium 4.8 so will hold off on supplement) Also on the losartan

## 2020-02-02 ENCOUNTER — Emergency Department: Payer: Medicare Other

## 2020-02-02 ENCOUNTER — Observation Stay
Admission: EM | Admit: 2020-02-02 | Discharge: 2020-02-03 | Disposition: A | Discharge status: Home / Self Care | Payer: Medicare Other | Attending: Internal Medicine | Admitting: Internal Medicine

## 2020-02-02 ENCOUNTER — Other Ambulatory Visit: Payer: Self-pay

## 2020-02-02 DIAGNOSIS — E876 Hypokalemia: Secondary | ICD-10-CM | POA: Diagnosis not present

## 2020-02-02 DIAGNOSIS — M6281 Muscle weakness (generalized): Secondary | ICD-10-CM | POA: Insufficient documentation

## 2020-02-02 DIAGNOSIS — I5032 Chronic diastolic (congestive) heart failure: Secondary | ICD-10-CM | POA: Diagnosis not present

## 2020-02-02 DIAGNOSIS — K909 Intestinal malabsorption, unspecified: Secondary | ICD-10-CM | POA: Diagnosis not present

## 2020-02-02 DIAGNOSIS — I4821 Permanent atrial fibrillation: Secondary | ICD-10-CM | POA: Diagnosis not present

## 2020-02-02 DIAGNOSIS — Z7902 Long term (current) use of antithrombotics/antiplatelets: Secondary | ICD-10-CM | POA: Insufficient documentation

## 2020-02-02 DIAGNOSIS — R778 Other specified abnormalities of plasma proteins: Secondary | ICD-10-CM | POA: Insufficient documentation

## 2020-02-02 DIAGNOSIS — N179 Acute kidney failure, unspecified: Secondary | ICD-10-CM | POA: Diagnosis not present

## 2020-02-02 DIAGNOSIS — I130 Hypertensive heart and chronic kidney disease with heart failure and stage 1 through stage 4 chronic kidney disease, or unspecified chronic kidney disease: Secondary | ICD-10-CM | POA: Diagnosis not present

## 2020-02-02 DIAGNOSIS — Z87891 Personal history of nicotine dependence: Secondary | ICD-10-CM | POA: Insufficient documentation

## 2020-02-02 DIAGNOSIS — R531 Weakness: Secondary | ICD-10-CM | POA: Diagnosis present

## 2020-02-02 DIAGNOSIS — N1832 Chronic kidney disease, stage 3b: Secondary | ICD-10-CM | POA: Insufficient documentation

## 2020-02-02 DIAGNOSIS — I4891 Unspecified atrial fibrillation: Secondary | ICD-10-CM | POA: Diagnosis present

## 2020-02-02 DIAGNOSIS — Z20822 Contact with and (suspected) exposure to covid-19: Secondary | ICD-10-CM | POA: Diagnosis not present

## 2020-02-02 DIAGNOSIS — G2581 Restless legs syndrome: Secondary | ICD-10-CM | POA: Diagnosis not present

## 2020-02-02 DIAGNOSIS — R197 Diarrhea, unspecified: Secondary | ICD-10-CM | POA: Diagnosis present

## 2020-02-02 DIAGNOSIS — Z79899 Other long term (current) drug therapy: Secondary | ICD-10-CM | POA: Diagnosis not present

## 2020-02-02 DIAGNOSIS — N138 Other obstructive and reflux uropathy: Secondary | ICD-10-CM

## 2020-02-02 DIAGNOSIS — N401 Enlarged prostate with lower urinary tract symptoms: Secondary | ICD-10-CM | POA: Diagnosis present

## 2020-02-02 LAB — URINALYSIS, COMPLETE (UACMP) WITH MICROSCOPIC
Bacteria, UA: NONE SEEN
Bilirubin Urine: NEGATIVE
Glucose, UA: NEGATIVE mg/dL
Ketones, ur: NEGATIVE mg/dL
Leukocytes,Ua: NEGATIVE
Nitrite: NEGATIVE
Protein, ur: NEGATIVE mg/dL
Specific Gravity, Urine: 1.01 (ref 1.005–1.030)
Squamous Epithelial / HPF: NONE SEEN (ref 0–5)
pH: 6 (ref 5.0–8.0)

## 2020-02-02 LAB — CBC WITH DIFFERENTIAL/PLATELET
Abs Immature Granulocytes: 0.04 10*3/uL (ref 0.00–0.07)
Basophils Absolute: 0 10*3/uL (ref 0.0–0.1)
Basophils Relative: 0 %
Eosinophils Absolute: 0.1 10*3/uL (ref 0.0–0.5)
Eosinophils Relative: 1 %
HCT: 35.7 % — ABNORMAL LOW (ref 39.0–52.0)
Hemoglobin: 12 g/dL — ABNORMAL LOW (ref 13.0–17.0)
Immature Granulocytes: 0 %
Lymphocytes Relative: 16 %
Lymphs Abs: 1.4 10*3/uL (ref 0.7–4.0)
MCH: 30.2 pg (ref 26.0–34.0)
MCHC: 33.6 g/dL (ref 30.0–36.0)
MCV: 89.7 fL (ref 80.0–100.0)
Monocytes Absolute: 0.6 10*3/uL (ref 0.1–1.0)
Monocytes Relative: 7 %
Neutro Abs: 6.8 10*3/uL (ref 1.7–7.7)
Neutrophils Relative %: 76 %
Platelets: 196 10*3/uL (ref 150–400)
RBC: 3.98 MIL/uL — ABNORMAL LOW (ref 4.22–5.81)
RDW: 14.3 % (ref 11.5–15.5)
WBC: 9 10*3/uL (ref 4.0–10.5)
nRBC: 0 % (ref 0.0–0.2)

## 2020-02-02 LAB — BASIC METABOLIC PANEL
Anion gap: 12 (ref 5–15)
BUN: 68 mg/dL — ABNORMAL HIGH (ref 8–23)
CO2: 28 mmol/L (ref 22–32)
Calcium: 9.1 mg/dL (ref 8.9–10.3)
Chloride: 93 mmol/L — ABNORMAL LOW (ref 98–111)
Creatinine, Ser: 2.61 mg/dL — ABNORMAL HIGH (ref 0.61–1.24)
GFR, Estimated: 22 mL/min — ABNORMAL LOW (ref >60)
Glucose, Bld: 93 mg/dL (ref 70–99)
Potassium: 2.6 mmol/L — CL (ref 3.5–5.1)
Sodium: 133 mmol/L — ABNORMAL LOW (ref 135–145)

## 2020-02-02 LAB — RESP PANEL BY RT-PCR (FLU A&B, COVID) ARPGX2
Influenza A by PCR: NEGATIVE
Influenza B by PCR: NEGATIVE
SARS Coronavirus 2 by RT PCR: NEGATIVE

## 2020-02-02 LAB — TROPONIN I (HIGH SENSITIVITY)
Troponin I (High Sensitivity): 458 ng/L (ref <18)
Troponin I (High Sensitivity): 409 ng/L (ref <18)
Troponin I (High Sensitivity): 393 ng/L (ref <18)

## 2020-02-02 LAB — MAGNESIUM: Magnesium: 2.1 mg/dL (ref 1.7–2.4)

## 2020-02-02 LAB — PROTIME-INR
INR: 1.2 (ref 0.8–1.2)
Prothrombin Time: 15 seconds (ref 11.4–15.2)

## 2020-02-02 MED ORDER — HYDRALAZINE HCL 25 MG PO TABS: 25.0000 mg | ORAL_TABLET | Freq: Four times a day (QID) | ORAL | Status: DC | PRN

## 2020-02-02 MED ORDER — ONDANSETRON HCL 4 MG/2ML IJ SOLN: 4.0000 mg | Freq: Four times a day (QID) | INTRAMUSCULAR | Status: DC | PRN

## 2020-02-02 MED ORDER — PANTOPRAZOLE SODIUM 40 MG PO TBEC
80.0000 mg | DELAYED_RELEASE_TABLET | Freq: Every day | ORAL | Status: DC
  Administered 2020-02-03: 80 mg via ORAL
  Filled 2020-02-02: qty 2

## 2020-02-02 MED ORDER — ACETAMINOPHEN 650 MG RE SUPP: 325.0000 mg | Freq: Four times a day (QID) | RECTAL | Status: DC | PRN

## 2020-02-02 MED ORDER — ONDANSETRON HCL 4 MG PO TABS: 4.0000 mg | ORAL_TABLET | Freq: Four times a day (QID) | ORAL | Status: DC | PRN

## 2020-02-02 MED ORDER — BUPROPION HCL ER (XL) 150 MG PO TB24
150.0000 mg | ORAL_TABLET | Freq: Every day | ORAL | Status: DC
  Administered 2020-02-03: 150 mg via ORAL
  Filled 2020-02-02: qty 1

## 2020-02-02 MED ORDER — PRAMIPEXOLE DIHYDROCHLORIDE 0.25 MG PO TABS
1.0000 mg | ORAL_TABLET | ORAL | Status: DC
  Administered 2020-02-02: 1 mg via ORAL
  Filled 2020-02-02: qty 1
  Filled 2020-02-02: qty 4

## 2020-02-02 MED ORDER — CLONAZEPAM 0.5 MG PO TABS
0.5000 mg | ORAL_TABLET | Freq: Every day | ORAL | Status: DC
  Administered 2020-02-02: 1 mg via ORAL
  Filled 2020-02-02: qty 2

## 2020-02-02 MED ORDER — APIXABAN 2.5 MG PO TABS
2.5000 mg | ORAL_TABLET | Freq: Two times a day (BID) | ORAL | Status: DC
  Administered 2020-02-02 – 2020-02-03 (×2): 2.5 mg via ORAL
  Filled 2020-02-02 (×3): qty 1

## 2020-02-02 MED ORDER — POTASSIUM CHLORIDE CRYS ER 20 MEQ PO TBCR
40.0000 meq | EXTENDED_RELEASE_TABLET | Freq: Once | ORAL | Status: AC
  Administered 2020-02-02: 40 meq via ORAL
  Filled 2020-02-02: qty 2

## 2020-02-02 MED ORDER — SODIUM CHLORIDE 0.9 % IV BOLUS
1000.0000 mL | Freq: Once | INTRAVENOUS | Status: AC
  Administered 2020-02-02: 1000 mL via INTRAVENOUS

## 2020-02-02 MED ORDER — POTASSIUM CHLORIDE 10 MEQ/100ML IV SOLN
10.0000 meq | Freq: Once | INTRAVENOUS | Status: AC
  Administered 2020-02-02: 10 meq via INTRAVENOUS
  Filled 2020-02-02: qty 100

## 2020-02-02 MED ORDER — ADULT MULTIVITAMIN W/MINERALS CH
1.0000 | ORAL_TABLET | Freq: Every day | ORAL | Status: DC
  Administered 2020-02-03: 1 via ORAL
  Filled 2020-02-02: qty 1

## 2020-02-02 MED ORDER — ACETAMINOPHEN 325 MG PO TABS: 325.0000 mg | ORAL_TABLET | Freq: Four times a day (QID) | ORAL | Status: DC | PRN

## 2020-02-02 MED ORDER — POTASSIUM CHLORIDE 10 MEQ/100ML IV SOLN
10.0000 meq | INTRAVENOUS | Status: AC
  Administered 2020-02-02 – 2020-02-03 (×4): 10 meq via INTRAVENOUS
  Filled 2020-02-02 (×2): qty 100

## 2020-02-02 NOTE — ED Notes (Signed)
Patient denies pain and is resting comfortably.  

## 2020-02-02 NOTE — ED Provider Notes (Signed)
[facility] Emergency Department Provider Note ____________________________________________   First MD Initiated Contact with Patient 02/02/20 1539     (approximate)  I have reviewed the triage vital signs and the nursing notes.   HISTORY  Chief Complaint Fall  HPI Stephen Garrett is a 86 y.o. male with recent new onset atrial fibrillation and history as listed below presents to the emergency department for treatment and evaluation for evaluation of non-syncopal fall 3 days ago. He didn't have pain afterward and decided not to come in for evaluation. He relates his fall to weakness in his lower extremities and states that has progressively worsened since.      Past Medical History:  Diagnosis Date  . Actinic keratosis   . BPH (benign prostatic hypertrophy)   . Diseases of lips   . Diverticulosis of colon 06/2003  . ED (erectile dysfunction)   . GERD (gastroesophageal reflux disease)   . Heart murmur    as child  . HLD (hyperlipidemia)   . HOH (hard of hearing)    Cochlear implant right, hearing aid left  . HTN (hypertension)   . Numbness and tingling in both hands    intermittent, trying braces   . Osteoarthritis of knee    severe, bilat  . Renal insufficiency   . Restless legs syndrome (RLS)   . Sleep disturbance, unspecified   . Spinal stenosis, lumbar region, without neurogenic claudication     Patient Active Problem List   Diagnosis Date Noted  . AKI (acute kidney injury) ([place]) 02/02/2020  . Diarrhea 02/02/2020  . Chronic diastolic heart failure ([place]) 01/14/2020  . Acute renal failure superimposed on stage 3 chronic kidney disease ([place]) 01/01/2020  . Hypokalemia 01/01/2020  . NSTEMI (non-ST elevated myocardial infarction) ([place]) 01/01/2020  . A-fib ([place]) 01/01/2020  . Diplopia 09/25/2017  . TIA (transient ischemic attack) 09/14/2017  . Retinal vein occlusion, central 02/19/2014  . Chronic venous insufficiency 02/19/2014  . Episodic  mood disorder ([place]) 12/20/2013  . Advanced directives, counseling/discussion 12/20/2013  . Routine general medical examination at a health care facility 11/02/2012  . Stage 3b chronic kidney disease ([place]) 01/26/2009  . Restless legs syndrome 10/03/2008  . OSTEOARTHRITIS 12/12/2006  . SPINAL STENOSIS, LUMBAR 06/24/2006  . Essential hypertension, benign 06/21/2006  . GERD 06/21/2006  . DIVERTICULOSIS, COLON 06/21/2006  . BPH with obstruction/lower urinary tract symptoms 06/21/2006    Past Surgical History:  Procedure Laterality Date  . CATARACT EXTRACTION, BILATERAL  2011   Dr. Thomasene Ripple  . COCHLEAR IMPLANT Right 02/15/2017   Procedure: RIGHT COCHLEAR IMPLANT;  Surgeon: Vicie Mutters, MD;  Location: [place];  Service: ENT;  Laterality: Right;  . FINGER SURGERY     Right 4th finger  . [facility]  10/2009   Dr. Bary Castilla  . LUMBAR SPINE SURGERY  06/2005   stenosis repair  . PTOSIS REPAIR Bilateral 04/19/2019   Procedure: BLEPHAROPTOSIS REPAIR; RESECT SKIN;  Surgeon: Karle Starch, MD;  Location: [place];  Service: Ophthalmology;  Laterality: Bilateral;  . TONSILLECTOMY  childhood  . TOTAL KNEE ARTHROPLASTY  06/2008   bilat (Dr. Marry Guan)    Prior to Admission medications   Medication Sig Start Date End Date Taking? Authorizing Provider  apixaban (ELIQUIS) 2.5 MG TABS tablet Take 1 tablet (2.5 mg total) by mouth 2 (two) times daily. 01/05/20  Yes Dessa Phi, DO  buPROPion (WELLBUTRIN XL) 150 MG 24 hr tablet TAKE 1 TABLET BY MOUTH DAILY Patient taking differently: Take  150 mg by mouth daily.  03/18/19  Yes [name], MD  clonazePAM (KLONOPIN) 0.5 MG tablet TAKE ONE TO TWO TABLETS AT BEDTIME Patient taking differently: Take 0.5-1 mg by mouth at bedtime.  10/22/19  Yes [name], MD  finasteride (PROSCAR) 5 MG tablet TAKE ONE TABLET BY MOUTH EVERY DAY Patient taking differently: Take 5 mg by mouth daily.  08/05/19  Yes [name], MD  furosemide  (LASIX) 80 MG tablet Take 1 tablet (80 mg total) by mouth daily. 01/29/20  Yes [name], MD  losartan (COZAAR) 50 MG tablet Take 1 tablet (50 mg total) by mouth daily. 01/06/20 04/05/20 Yes Agbor-Etang, Aaron Edelman, MD  metolazone (ZAROXOLYN) 2.5 MG tablet Take 1 tablet (2.5 mg total) by mouth daily as needed. Patient taking differently: Take 2.5 mg by mouth daily as needed (excessive fluid retention).  01/29/20  Yes [name], MD  Multiple Vitamin (MULTIVITAMIN) capsule Take 1 capsule by mouth daily.     Yes [provider]  omeprazole (PRILOSEC) 40 MG capsule TAKE 1 CAPSULE BY MOUTH ONCE DAILY Patient taking differently: Take 40 mg by mouth daily.  04/25/19  Yes [name], MD  polyethylene glycol (MIRALAX / GLYCOLAX) packet Take 17 g by mouth daily.   Yes [provider]  pramipexole (MIRAPEX) 1 MG tablet TAKE ONE TABLET AT DINNER AND ONE TABLETAT BEDTIME Patient taking differently: Take 1 mg by mouth See admin instructions. Take 1 tablet (1mg ) by mouth at suppertime and take 1 tablet (1mg ) by mouth at bedtime 09/16/19  Yes [name], MD  pyridoxine (B-6) 100 MG tablet Take 100 mg by mouth daily.   Yes [provider]  tamsulosin (FLOMAX) 0.4 MG CAPS capsule TAKE 1 CAPSULE EVERY DAY Patient taking differently: Take 0.4 mg by mouth daily.  08/05/19  Yes [name], MD  citalopram (CELEXA) 10 MG tablet Take 1 tablet (10 mg total) by mouth daily. 04/22/11 05/18/11  [name], MD    Allergies Atorvastatin, Pravastatin sodium, and Statins  Family History  Problem Relation Age of Onset  . Other Father        "clogged arteries"  . Hypertension Mother     Social History Social History   Tobacco Use  . Smoking status: Former Smoker    Packs/day: 0.75    Years: 30.00    Pack years: 22.50    Quit date: 02/28/1970    Years since quitting: 49.9  . Smokeless tobacco: Never Used  Vaping Use  . Vaping Use: Never used  Substance Use  Topics  . Alcohol use: Yes    Alcohol/week: 0.0 standard drinks    Comment: 1-2 glasses wine/daily  . Drug use: No    Review of Systems  Constitutional: No fever/chills. Positive for generalized weakness bilateral lower extremities. Eyes: No visual changes. ENT: No sore throat. Cardiovascular: Denies chest pain. Respiratory: Denies shortness of breath. Gastrointestinal: No abdominal pain.  No nausea, no vomiting.  No diarrhea.  No constipation. Genitourinary: Negative for dysuria. Musculoskeletal: Negative for back pain. Skin: Negative for rash. Neurological: Negative for headaches, focal weakness or numbness  ____________________________________________   PHYSICAL EXAM:  VITAL SIGNS: ED Triage Vitals  Enc Vitals Group     BP 02/02/20 1500 (!) 157/93     Pulse Rate 02/02/20 1500 68     Resp 02/02/20 1500 16     Temp 02/02/20 1500 97.9 F (36.6 C)     Temp Source 02/02/20  1500 Oral     SpO2 02/02/20 1450 100 %     Weight 02/02/20 1452 175 lb (79.4 kg)     Height 02/02/20 1452 5\' 6"  (1.676 m)     Head Circumference --      Peak Flow --      Pain Score 02/02/20 1451 0     Pain Loc --      Pain Edu? --      Excl. in [place]? --     Constitutional: Alert and oriented. Chronically ill appearing and in no acute distress. Eyes: Conjunctivae are normal. PERRL. EOMI. Pupils 69mm Head: Abrasion on forehead over left eyebrow. Nose: No congestion/rhinnorhea. No evidence of epistaxis. Mouth/Throat: Mucous membranes are dry.  Oropharynx non-erythematous. Neck: No stridor.   Hematological/Lymphatic/Immunilogical: No cervical lymphadenopathy. Cardiovascular: Normal rate, irregular rhythm. Grossly normal heart sounds.  Good peripheral circulation. Respiratory: Normal respiratory effort.  No retractions. Lungs CTAB. Gastrointestinal: Soft and nontender. No distention. No abdominal bruits. Musculoskeletal: No lower extremity tenderness. Trace pitting edema bilateral mid lower  extremities. Neurologic:  Normal speech and language. No gross focal neurologic deficits are appreciated. No gait instability. Skin:  Skin is warm, dry and intact. No rash noted. Psychiatric: Mood and affect are normal. Speech and behavior are normal.  ____________________________________________   LABS (all labs ordered are listed, but only abnormal results are displayed)  Labs Reviewed  BASIC METABOLIC PANEL - Abnormal; Notable for the following components:      Result Value   Sodium 133 (*)    Potassium 2.6 (*)    Chloride 93 (*)    BUN 68 (*)    Creatinine, Ser 2.61 (*)    GFR, Estimated 22 (*)    All other components within normal limits  CBC WITH DIFFERENTIAL/PLATELET - Abnormal; Notable for the following components:   RBC 3.98 (*)    Hemoglobin 12.0 (*)    HCT 35.7 (*)    All other components within normal limits  URINALYSIS, COMPLETE (UACMP) WITH MICROSCOPIC - Abnormal; Notable for the following components:   Color, Urine YELLOW (*)    APPearance HAZY (*)    Hgb urine dipstick SMALL (*)    All other components within normal limits  TROPONIN I (HIGH SENSITIVITY) - Abnormal; Notable for the following components:   Troponin I (High Sensitivity) 458 (*)    All other components within normal limits  TROPONIN I (HIGH SENSITIVITY) - Abnormal; Notable for the following components:   Troponin I (High Sensitivity) 409 (*)    All other components within normal limits  RESP PANEL BY RT-PCR (FLU A&B, COVID) ARPGX2  C DIFFICILE QUICK SCREEN W PCR REFLEX  PROTIME-INR  BASIC METABOLIC PANEL  CBC   ____________________________________________  EKG  ED ECG REPORT I, Hermon Zea, FNP-BC personally viewed and interpreted this ECG.   Date: 02/02/2020  EKG Time: 1553  Rate: 64  Rhythm: normal EKG, normal sinus rhythm, unchanged from previous tracings, atrial fibrillation, rate controlled  Axis: normal  Intervals:none  ST&T Change: no ST  elevation  ____________________________________________  RADIOLOGY  ED MD interpretation:    Stable cardiomegaly without acute concerns on chest x-ray.  I, Sherrie George, personally viewed and evaluated these images (plain radiographs) as part of my medical decision making, as well as reviewing the written report by the radiologist.  Official radiology report(s): DG Chest 1 View  Result Date: 02/02/2020 CLINICAL DATA:  Weakness and recent fall, initial encounter EXAM: CHEST  1 VIEW COMPARISON:  01/01/2020 FINDINGS: Cardiac  shadow is enlarged but stable. Aortic calcifications are noted. The lungs are well aerated bilaterally without focal infiltrate or sizable effusion. Exuberant costal cartilage calcification is seen. No acute bony abnormality is noted. IMPRESSION: Chronic cardiomegaly. No acute abnormality noted. Electronically Signed   By: Inez Catalina M.D.   On: 02/02/2020 16:33   CT Head Wo Contrast  Result Date: 02/02/2020 CLINICAL DATA:  [place] 4 days ago. Increased lethargy and weakness since. EXAM: CT HEAD WITHOUT CONTRAST CT CERVICAL SPINE WITHOUT CONTRAST TECHNIQUE: Multidetector CT imaging of the head and cervical spine was performed following the standard protocol without intravenous contrast. Multiplanar CT image reconstructions of the cervical spine were also generated. COMPARISON:  01/01/2020. FINDINGS: CT HEAD FINDINGS Brain: No evidence of acute infarction, hemorrhage, hydrocephalus, extra-axial collection or mass lesion/mass effect. There is ventricular sulcal enlargement reflecting age related volume loss. Patchy white matter hypoattenuation is also noted consistent with mild chronic microvascular ischemic change. These findings are stable. Vascular: No hyperdense vessel or unexpected calcification. Skull: No fracture or lesion. Right sided cochlear implant and changes from a previous mastoidectomy. Sinuses/Orbits: Globes and orbits are unremarkable. Sinuses are clear. Other:  None. CT CERVICAL SPINE FINDINGS Alignment: Mild, grade 1, degenerative anterolisthesis of C3 on C4 and C4 on C5. Skull base and vertebrae: No acute fracture. No primary bone lesion or focal pathologic process. Soft tissues and spinal canal: No prevertebral fluid or swelling. No visible canal hematoma. Disc levels: Multilevel disc and facet degenerative change, loss of disc height greatest at C5-C6 and C6-C7 and facet degenerative changes greatest at the C3-C4 and C4-C5 levels. No convincing disc herniation. Upper chest: No acute findings. Other: None. IMPRESSION: HEAD CT 1. No acute intracranial abnormalities. 2. Age related volume loss and chronic microvascular ischemic change. CERVICAL CT 1. No fracture or acute finding. 2. Advanced degenerative changes. Electronically Signed   By: Lajean Manes M.D.   On: 02/02/2020 17:06   CT Cervical Spine Wo Contrast  Result Date: 02/02/2020 CLINICAL DATA:  [place] 4 days ago. Increased lethargy and weakness since. EXAM: CT HEAD WITHOUT CONTRAST CT CERVICAL SPINE WITHOUT CONTRAST TECHNIQUE: Multidetector CT imaging of the head and cervical spine was performed following the standard protocol without intravenous contrast. Multiplanar CT image reconstructions of the cervical spine were also generated. COMPARISON:  01/01/2020. FINDINGS: CT HEAD FINDINGS Brain: No evidence of acute infarction, hemorrhage, hydrocephalus, extra-axial collection or mass lesion/mass effect. There is ventricular sulcal enlargement reflecting age related volume loss. Patchy white matter hypoattenuation is also noted consistent with mild chronic microvascular ischemic change. These findings are stable. Vascular: No hyperdense vessel or unexpected calcification. Skull: No fracture or lesion. Right sided cochlear implant and changes from a previous mastoidectomy. Sinuses/Orbits: Globes and orbits are unremarkable. Sinuses are clear. Other: None. CT CERVICAL SPINE FINDINGS Alignment: Mild, grade 1,  degenerative anterolisthesis of C3 on C4 and C4 on C5. Skull base and vertebrae: No acute fracture. No primary bone lesion or focal pathologic process. Soft tissues and spinal canal: No prevertebral fluid or swelling. No visible canal hematoma. Disc levels: Multilevel disc and facet degenerative change, loss of disc height greatest at C5-C6 and C6-C7 and facet degenerative changes greatest at the C3-C4 and C4-C5 levels. No convincing disc herniation. Upper chest: No acute findings. Other: None. IMPRESSION: HEAD CT 1. No acute intracranial abnormalities. 2. Age related volume loss and chronic microvascular ischemic change. CERVICAL CT 1. No fracture or acute finding. 2. Advanced degenerative changes. Electronically Signed   By: Dedra Skeens.D.  On: 02/02/2020 17:06    ____________________________________________   PROCEDURES  Procedure(s) performed (including Critical Care):  Procedures  ____________________________________________   INITIAL IMPRESSION / ASSESSMENT AND PLAN     86 year old male presenting to the ER after fall secondary to weakness 3 days ago. He was not evaluated afterward as he didn't have any pain and felt he was ok. See HPI for further details. Plan will be to work him up for weakness.  DIFFERENTIAL DIAGNOSIS  Acute cystitis, symptomatic atrial fibrillation, dehydration, cardiac event, cva  ED COURSE  Clinical Course as of Feb 01 1950  Sun Feb 02, 2020  1647 Troponin 458. BUN 68 with a Creatinine of 2.61 and potassium of 2.6. Plan will be to admit. Patient not having chest pain or cardiac symptoms. Troponin likely related to AKI.    [CT]    Clinical Course User Index [CT] Mc Hollen B, FNP   ___________________________________________   FINAL CLINICAL IMPRESSION(S) / ED DIAGNOSES  Final diagnoses:  Acute kidney injury ([place])  Elevated troponin  Hypokalemia     ED Discharge Orders    None       Stephen Garrett was evaluated in Emergency  Department on 02/02/2020 for the symptoms described in the history of present illness. He was evaluated in the context of the global COVID-19 pandemic, which necessitated consideration that the patient might be at risk for infection with the SARS-CoV-2 virus that causes COVID-19. Institutional protocols and algorithms that pertain to the evaluation of patients at risk for COVID-19 are in a state of rapid change based on information released by regulatory bodies including the CDC and federal and state organizations. These policies and algorithms were followed during the patient's care in the ED.   Note:  This document was prepared using Dragon voice recognition software and may include unintentional dictation errors.   Victorino Dike, FNP 02/02/20 Leonides Schanz    Duffy Bruce, MD 02/02/20 2238

## 2020-02-02 NOTE — ED Notes (Signed)
Medication Reconciliation Report  For Home History Technicians  HIGHLIGHTS:  1. The patient WAS personally interviewed 2. If not, what was the main source used: PHARMACY RECORDS 3. Does the patient appear to take any anti-coagulation agents (e.g. warfarin, Eliquis or Xarelto): YES 4. Does the patient appear to take any anti-convulsant agents (e.g. divalproex, levetiracetam or phenytoin): NO 5. Does the patient appear to use any insulin products (e.g. Lantus, Novolin or Humalog): NO 6. Does the patient appear to take any "beta-blockers" (e.g. metoprolol, carvedilol or bisoprolol: NO  BARRIERS:  1. Were there any barriers that prevented or complicated the medication reconciliation process: YES 2. If yes, what was the primary barrier encountered: Poor historian 3. Does the patient appear compliant with prescribed medications: UNABLE TO DETERMINE 4. Does the patient express any barriers with compliance: UNABLE TO DETERMINE 5. What is the primary barrier the patient reports: None   NOTES:[Include any concerns, remarks or complaints the patient expresses regarding medication therapy. Any observations or other information that might be useful to the treatment team can also be included. Immediate needs or concerns should be referred to the RN or appropriate member of the treatment team.]  The patient was interviewed but was unable to provide much detail regarding his home medications. Pharmacy records and recent chart notes (01/29/2020) were used to complete med reconciliation. Patient uncertain if he took morning doses of medication this AM.  Colen Darling, CPhT [place] [facility] [place]. [place],  [address] [phone]  ** The above is intended solely for informational and/or communicative purposes. It should in no way be considered an endorsement of any specific treatment, therapy or action. **

## 2020-02-02 NOTE — H&P (Signed)
History and Physical   Stephen Garrett OVF:[id number] DOB: September 19, 1923 DOA: 02/02/2020  PCP: [name], MD  Outpatient Specialists:  Patient coming from: Home ([place]) via EMS  I have personally briefly reviewed patient's old medical records in [place].  Chief Concern: Weakness and fall  HPI: Stephen Garrett is a 86 y.o. male with medical history significant for hypertension, hyperlipidemia, atrial fibrillation on Eliquis, CKD, BPH, status post cochlear implant and spinal stenosis, TIAs, restless leg syndrome, GERD, bilateral hearing loss, presented to the emergency department for chief concerns of fall and weakness in his lower extremities and states this has been progressively worsened in the last 3 days.  He reports his left ankle is worsening in swelling but overall bilateral legs are better.  He denies hitting his head and syncopal episode.  Daughter at bedside reports that patient has had multiple episodes of diarrhea.  On further questioning, daughter states that he has had diarrhea since prior to hospitalization on 01/01/2020 to 01/05/2020.  She has associated this with dairy intake.  ROS was negative for headache, fever, nausea, vomiting, fever, chills, chest pain, abdominal pain, shortness of breath, dysuria, hematuria, diarrhea, blood in stool.  ED Course: Discussed with ED provider, requesting admission for hypokalemia suspect secondary to over diuresis  Review of Systems: As per HPI otherwise 10 point review of systems negative.  Assessment/Plan  Active Problems:   BPH with obstruction/lower urinary tract symptoms   Restless legs syndrome   Hypokalemia   A-fib (HCC)   AKI (acute kidney injury) (HCC)   Diarrhea   Hypokalemia-multifactorial including new diuresis with Lasix and metolazone in setting of GI loss due to diarrhea -Holding home Lasix 80 mg daily, losartan 50 mg daily, metolazone 2.5 mg daily -EKG showed flattened T  waves -Status post 40 mEq p.o. and 10 mEq IV per ED provider -Potassium 10 mEq IV every 1 hour for 5 doses ordered -Repeat BMP, EKG after completion of potassium IV -BMP in the a.m. as well  Severely dilated left atrium-continue Eliquis 2.5 mg twice daily at this time Atrial fibrillation -Cardiology has evaluated patient and due to low probability of success in setting of severely dilated left atrium, cardioversion was not attempted -Heart rate was low, beta-blockade was held on discharge  Elevated HS troponin - peaked at 458 -Patient denies chest pain and shortness of breath -However, given higher level of trop HS and severely dilated left atrium, cardiology, Dr. Garen Lah consulted via secure chat for recommendations on medical optimization -Repeat troponin after completion of potassium IV has been ordered  Acute kidney injury-multifactorial including secondary to poor p.o. intake, new diuresis, and GI loss via chronic persistent diarrhea  Hypertension - holding home hypertensive - Hydralazine 25 mg q6h prn for SBP > 160   Chart reviewed.   11/3-11/08/2019 hospitalization: Prior home anti-hypertensive medications were amlodipine 5 mg daily, hctz 25 mg daily, losartan 100 mg daily. On discharge, HCTZ 25 mg daily and losartan were held due to [facility]. He was asked to resumed amlodipine 5 mg.  01/01/2020 complete echo: LV EF estimated at 55 to 60%, LV has normal function.  Left ventricle has no regional wall abnormalities.  Moderate severe left ventricular hypertrophy.  Left ventricular diastolic parameters are indeterminate.  Severely dilated left atrial size.  Right atrial size was normal in size.  PCP follow-up, he was started on lasix 80 mg daily on 01/14/20 and then metolazone 2.5 mg daily on 01/29/20.   DVT prophylaxis:  On Eliquis Code Status: full code  Diet: Heart healthy Family Communication: Discussed extensively with daughter at bedside Disposition Plan: Pending clinical  course Consults called: Cardiology Admission status: Observation with telemetry  Past Medical History:  Diagnosis Date  . Actinic keratosis   . BPH (benign prostatic hypertrophy)   . Diseases of lips   . Diverticulosis of colon 06/2003  . ED (erectile dysfunction)   . GERD (gastroesophageal reflux disease)   . Heart murmur    as child  . HLD (hyperlipidemia)   . HOH (hard of hearing)    Cochlear implant right, hearing aid left  . HTN (hypertension)   . Numbness and tingling in both hands    intermittent, trying braces   . Osteoarthritis of knee    severe, bilat  . Renal insufficiency   . Restless legs syndrome (RLS)   . Sleep disturbance, unspecified   . Spinal stenosis, lumbar region, without neurogenic claudication    Past Surgical History:  Procedure Laterality Date  . CATARACT EXTRACTION, BILATERAL  2011   Dr. Thomasene Ripple  . COCHLEAR IMPLANT Right 02/15/2017   Procedure: RIGHT COCHLEAR IMPLANT;  Surgeon: Vicie Mutters, MD;  Location: [facility];  Service: ENT;  Laterality: Right;  . FINGER SURGERY     Right 4th finger  . [facility]  10/2009   Dr. Bary Castilla  . LUMBAR SPINE SURGERY  06/2005   stenosis repair  . PTOSIS REPAIR Bilateral 04/19/2019   Procedure: BLEPHAROPTOSIS REPAIR; RESECT SKIN;  Surgeon: Karle Starch, MD;  Location: [place];  Service: Ophthalmology;  Laterality: Bilateral;  . TONSILLECTOMY  childhood  . TOTAL KNEE ARTHROPLASTY  06/2008   bilat (Dr. Marry Guan)   Social History:  reports that he quit smoking about 49 years ago. He has a 22.50 pack-year smoking history. He has never used smokeless tobacco. He reports current alcohol use. He reports that he does not use drugs.  Allergies  Allergen Reactions  . Atorvastatin     REACTION: raises blood pressure  . Pravastatin Sodium     REACTION: raises blood pressure  . Statins     REACTION: raises bp   Family History  Problem Relation Age of Onset  . Other Father        "clogged  arteries"  . Hypertension Mother    Family history: Family history reviewed and not pertinent  Prior to Admission medications   Medication Sig Start Date End Date Taking? Authorizing Provider  apixaban (ELIQUIS) 2.5 MG TABS tablet Take 1 tablet (2.5 mg total) by mouth 2 (two) times daily. 01/05/20  Yes Dessa Phi, DO  buPROPion (WELLBUTRIN XL) 150 MG 24 hr tablet TAKE 1 TABLET BY MOUTH DAILY Patient taking differently: Take 150 mg by mouth daily.  03/18/19  Yes [name], MD  clonazePAM (KLONOPIN) 0.5 MG tablet TAKE ONE TO TWO TABLETS AT BEDTIME Patient taking differently: Take 0.5-1 mg by mouth at bedtime.  10/22/19  Yes [name], MD  finasteride (PROSCAR) 5 MG tablet TAKE ONE TABLET BY MOUTH EVERY DAY Patient taking differently: Take 5 mg by mouth daily.  08/05/19  Yes [name], MD  furosemide (LASIX) 80 MG tablet Take 1 tablet (80 mg total) by mouth daily. 01/29/20  Yes [name], MD  losartan (COZAAR) 50 MG tablet Take 1 tablet (50 mg total) by mouth daily. 01/06/20 04/05/20 Yes Agbor-Etang, Aaron Edelman, MD  metolazone (ZAROXOLYN) 2.5 MG tablet Take 1 tablet (2.5 mg total) by mouth daily as  needed. Patient taking differently: Take 2.5 mg by mouth daily as needed (excessive fluid retention).  01/29/20  Yes [name], MD  Multiple Vitamin (MULTIVITAMIN) capsule Take 1 capsule by mouth daily.     Yes [provider]  omeprazole (PRILOSEC) 40 MG capsule TAKE 1 CAPSULE BY MOUTH ONCE DAILY Patient taking differently: Take 40 mg by mouth daily.  04/25/19  Yes [name], MD  polyethylene glycol (MIRALAX / GLYCOLAX) packet Take 17 g by mouth daily.   Yes [provider]  pramipexole (MIRAPEX) 1 MG tablet TAKE ONE TABLET AT DINNER AND ONE TABLETAT BEDTIME Patient taking differently: Take 1 mg by mouth See admin instructions. Take 1 tablet (1mg ) by mouth at suppertime and take 1 tablet (1mg ) by mouth at bedtime 09/16/19  Yes [name], MD   pyridoxine (B-6) 100 MG tablet Take 100 mg by mouth daily.   Yes [provider]  tamsulosin (FLOMAX) 0.4 MG CAPS capsule TAKE 1 CAPSULE EVERY DAY Patient taking differently: Take 0.4 mg by mouth daily.  08/05/19  Yes [name], MD  citalopram (CELEXA) 10 MG tablet Take 1 tablet (10 mg total) by mouth daily. 04/22/11 05/18/11  [name], MD   Physical Exam: Vitals:   02/02/20 1625 02/02/20 1630 02/02/20 1710 02/02/20 1925  BP: (!) 159/88 (!) 162/83 (!) 152/76 (!) 145/74  Pulse: 67 (!) 58 (!) 56 69  Resp: 17 12 13 14   Temp:    98.1 F (36.7 C)  TempSrc:    Oral  SpO2: 96% 97% 97% 100%  Weight:      Height:       Constitutional: appears age-appropriate, NAD, calm, comfortable Eyes: PERRL, lids and conjunctivae normal ENMT: Mucous membranes are moist. Posterior pharynx clear of any exudate or lesions. Age-appropriate dentition. Hearing loss Neck: normal, supple, no masses, no thyromegaly Respiratory: clear to auscultation bilaterally, no wheezing, no crackles. Normal respiratory effort. No accessory muscle use.  Cardiovascular: Regular rate and rhythm, no murmurs / rubs / gallops. No extremity edema. 2+ pedal pulses. No carotid bruits.  Abdomen: no tenderness, no masses palpated, no hepatosplenomegaly. Bowel sounds positive.  Musculoskeletal: no clubbing / cyanosis. No joint deformity upper and lower extremities. Good ROM, no contractures, no atrophy. Normal muscle tone.  Skin: no rashes, lesions, ulcers. No induration Neurologic: Sensation intact. Strength 5/5 in all 4.  Psychiatric: Normal judgment and insight. Alert and oriented x 3. Normal mood.   EKG: Independently reviewed, showing atrial fibrillation, rate of 64, QTc 455, flattened T wave  Chest x-ray on Admission: Personally reviewed and I agree with radiologist reading as below.  DG Chest 1 View  Result Date: 02/02/2020 CLINICAL DATA:  Weakness and recent fall, initial encounter EXAM: CHEST  1 VIEW  COMPARISON:  01/01/2020 FINDINGS: Cardiac shadow is enlarged but stable. Aortic calcifications are noted. The lungs are well aerated bilaterally without focal infiltrate or sizable effusion. Exuberant costal cartilage calcification is seen. No acute bony abnormality is noted. IMPRESSION: Chronic cardiomegaly. No acute abnormality noted. Electronically Signed   By: Inez Catalina M.D.   On: 02/02/2020 16:33   CT Head Wo Contrast  Result Date: 02/02/2020 CLINICAL DATA:  [place] 4 days ago. Increased lethargy and weakness since. EXAM: CT HEAD WITHOUT CONTRAST CT CERVICAL SPINE WITHOUT CONTRAST TECHNIQUE: Multidetector CT imaging of the head and cervical spine was performed following the standard protocol without intravenous contrast. Multiplanar CT image reconstructions of the cervical spine were also generated. COMPARISON:  01/01/2020.  FINDINGS: CT HEAD FINDINGS Brain: No evidence of acute infarction, hemorrhage, hydrocephalus, extra-axial collection or mass lesion/mass effect. There is ventricular sulcal enlargement reflecting age related volume loss. Patchy white matter hypoattenuation is also noted consistent with mild chronic microvascular ischemic change. These findings are stable. Vascular: No hyperdense vessel or unexpected calcification. Skull: No fracture or lesion. Right sided cochlear implant and changes from a previous mastoidectomy. Sinuses/Orbits: Globes and orbits are unremarkable. Sinuses are clear. Other: None. CT CERVICAL SPINE FINDINGS Alignment: Mild, grade 1, degenerative anterolisthesis of C3 on C4 and C4 on C5. Skull base and vertebrae: No acute fracture. No primary bone lesion or focal pathologic process. Soft tissues and spinal canal: No prevertebral fluid or swelling. No visible canal hematoma. Disc levels: Multilevel disc and facet degenerative change, loss of disc height greatest at C5-C6 and C6-C7 and facet degenerative changes greatest at the C3-C4 and C4-C5 levels. No convincing disc  herniation. Upper chest: No acute findings. Other: None. IMPRESSION: HEAD CT 1. No acute intracranial abnormalities. 2. Age related volume loss and chronic microvascular ischemic change. CERVICAL CT 1. No fracture or acute finding. 2. Advanced degenerative changes. Electronically Signed   By: Lajean Manes M.D.   On: 02/02/2020 17:06   CT Cervical Spine Wo Contrast  Result Date: 02/02/2020 CLINICAL DATA:  [place] 4 days ago. Increased lethargy and weakness since. EXAM: CT HEAD WITHOUT CONTRAST CT CERVICAL SPINE WITHOUT CONTRAST TECHNIQUE: Multidetector CT imaging of the head and cervical spine was performed following the standard protocol without intravenous contrast. Multiplanar CT image reconstructions of the cervical spine were also generated. COMPARISON:  01/01/2020. FINDINGS: CT HEAD FINDINGS Brain: No evidence of acute infarction, hemorrhage, hydrocephalus, extra-axial collection or mass lesion/mass effect. There is ventricular sulcal enlargement reflecting age related volume loss. Patchy white matter hypoattenuation is also noted consistent with mild chronic microvascular ischemic change. These findings are stable. Vascular: No hyperdense vessel or unexpected calcification. Skull: No fracture or lesion. Right sided cochlear implant and changes from a previous mastoidectomy. Sinuses/Orbits: Globes and orbits are unremarkable. Sinuses are clear. Other: None. CT CERVICAL SPINE FINDINGS Alignment: Mild, grade 1, degenerative anterolisthesis of C3 on C4 and C4 on C5. Skull base and vertebrae: No acute fracture. No primary bone lesion or focal pathologic process. Soft tissues and spinal canal: No prevertebral fluid or swelling. No visible canal hematoma. Disc levels: Multilevel disc and facet degenerative change, loss of disc height greatest at C5-C6 and C6-C7 and facet degenerative changes greatest at the C3-C4 and C4-C5 levels. No convincing disc herniation. Upper chest: No acute findings. Other: None.  IMPRESSION: HEAD CT 1. No acute intracranial abnormalities. 2. Age related volume loss and chronic microvascular ischemic change. CERVICAL CT 1. No fracture or acute finding. 2. Advanced degenerative changes. Electronically Signed   By: Lajean Manes M.D.   On: 02/02/2020 17:06   Labs on Admission: I have personally reviewed following labs  CBC: Recent Labs  Lab 02/02/20 1508  WBC 9.0  NEUTROABS 6.8  HGB 12.0*  HCT 35.7*  MCV 89.7  PLT 224   Basic Metabolic Panel: Recent Labs  Lab 02/02/20 1508  NA 133*  K 2.6*  CL 93*  CO2 28  GLUCOSE 93  BUN 68*  CREATININE 2.61*  CALCIUM 9.1   Coagulation Profile: Recent Labs  Lab 02/02/20 1508  INR 1.2   HS Troponin: 458 to 409  Urine analysis:    Component Value Date/Time   COLORURINE YELLOW (A) 02/02/2020 1612   APPEARANCEUR HAZY (A) 02/02/2020  1612   LABSPEC 1.010 02/02/2020 1612   PHURINE 6.0 02/02/2020 1612   GLUCOSEU NEGATIVE 02/02/2020 1612   HGBUR SMALL (A) 02/02/2020 1612   HGBUR large 09/03/2008 0933   BILIRUBINUR NEGATIVE 02/02/2020 1612   BILIRUBINUR Negative 08/04/2017 1446   KETONESUR NEGATIVE 02/02/2020 1612   PROTEINUR NEGATIVE 02/02/2020 1612   UROBILINOGEN 0.2 08/04/2017 1446   UROBILINOGEN 0.2 09/03/2008 0933   NITRITE NEGATIVE 02/02/2020 1612   LEUKOCYTESUR NEGATIVE 02/02/2020 1612   Padraic Marinos N Carliss Porcaro D.O. [facility]  If 12AM-7AM, please contact overnight-coverage provider If 7AM-7PM, please contact day coverage provider [email or web address]  02/02/2020, 7:32 PM

## 2020-02-02 NOTE — ED Notes (Signed)
Pt provided with diet tray at this time.

## 2020-02-02 NOTE — ED Notes (Signed)
Admit MD at bedside

## 2020-02-02 NOTE — ED Triage Notes (Signed)
Accidental fall on Thursday. Family reports increasing lethargy/weakness since, unknown LOC. Not evaluated after fall

## 2020-02-03 ENCOUNTER — Encounter: Payer: Self-pay | Admitting: Internal Medicine

## 2020-02-03 ENCOUNTER — Telehealth: Payer: Self-pay | Admitting: Internal Medicine

## 2020-02-03 ENCOUNTER — Observation Stay (HOSPITAL_BASED_OUTPATIENT_CLINIC_OR_DEPARTMENT_OTHER)
Admit: 2020-02-03 | Discharge: 2020-02-03 | Disposition: A | Discharge status: Home / Self Care | Payer: Medicare Other | Attending: Physician Assistant | Admitting: Physician Assistant

## 2020-02-03 DIAGNOSIS — R7989 Other specified abnormal findings of blood chemistry: Secondary | ICD-10-CM

## 2020-02-03 DIAGNOSIS — N1832 Chronic kidney disease, stage 3b: Secondary | ICD-10-CM | POA: Diagnosis not present

## 2020-02-03 DIAGNOSIS — I130 Hypertensive heart and chronic kidney disease with heart failure and stage 1 through stage 4 chronic kidney disease, or unspecified chronic kidney disease: Secondary | ICD-10-CM | POA: Diagnosis not present

## 2020-02-03 DIAGNOSIS — M1990 Unspecified osteoarthritis, unspecified site: Secondary | ICD-10-CM | POA: Diagnosis not present

## 2020-02-03 DIAGNOSIS — R778 Other specified abnormalities of plasma proteins: Secondary | ICD-10-CM | POA: Diagnosis not present

## 2020-02-03 DIAGNOSIS — I4821 Permanent atrial fibrillation: Secondary | ICD-10-CM | POA: Diagnosis not present

## 2020-02-03 DIAGNOSIS — N179 Acute kidney failure, unspecified: Secondary | ICD-10-CM

## 2020-02-03 DIAGNOSIS — Z741 Need for assistance with personal care: Secondary | ICD-10-CM | POA: Diagnosis not present

## 2020-02-03 DIAGNOSIS — N183 Chronic kidney disease, stage 3 unspecified: Secondary | ICD-10-CM

## 2020-02-03 DIAGNOSIS — E7800 Pure hypercholesterolemia, unspecified: Secondary | ICD-10-CM | POA: Diagnosis not present

## 2020-02-03 DIAGNOSIS — M4800 Spinal stenosis, site unspecified: Secondary | ICD-10-CM | POA: Diagnosis not present

## 2020-02-03 DIAGNOSIS — Z789 Other specified health status: Secondary | ICD-10-CM | POA: Diagnosis not present

## 2020-02-03 DIAGNOSIS — N401 Enlarged prostate with lower urinary tract symptoms: Secondary | ICD-10-CM | POA: Diagnosis not present

## 2020-02-03 DIAGNOSIS — I340 Nonrheumatic mitral (valve) insufficiency: Secondary | ICD-10-CM | POA: Diagnosis not present

## 2020-02-03 DIAGNOSIS — R600 Localized edema: Secondary | ICD-10-CM | POA: Diagnosis not present

## 2020-02-03 DIAGNOSIS — I5032 Chronic diastolic (congestive) heart failure: Secondary | ICD-10-CM | POA: Diagnosis not present

## 2020-02-03 DIAGNOSIS — I10 Essential (primary) hypertension: Secondary | ICD-10-CM | POA: Diagnosis not present

## 2020-02-03 DIAGNOSIS — R531 Weakness: Secondary | ICD-10-CM | POA: Diagnosis not present

## 2020-02-03 DIAGNOSIS — I4819 Other persistent atrial fibrillation: Secondary | ICD-10-CM

## 2020-02-03 DIAGNOSIS — H9190 Unspecified hearing loss, unspecified ear: Secondary | ICD-10-CM | POA: Diagnosis not present

## 2020-02-03 DIAGNOSIS — R262 Difficulty in walking, not elsewhere classified: Secondary | ICD-10-CM | POA: Diagnosis not present

## 2020-02-03 DIAGNOSIS — G2581 Restless legs syndrome: Secondary | ICD-10-CM | POA: Diagnosis not present

## 2020-02-03 DIAGNOSIS — R2681 Unsteadiness on feet: Secondary | ICD-10-CM | POA: Diagnosis not present

## 2020-02-03 DIAGNOSIS — E785 Hyperlipidemia, unspecified: Secondary | ICD-10-CM | POA: Diagnosis not present

## 2020-02-03 DIAGNOSIS — N184 Chronic kidney disease, stage 4 (severe): Secondary | ICD-10-CM | POA: Diagnosis not present

## 2020-02-03 DIAGNOSIS — N400 Enlarged prostate without lower urinary tract symptoms: Secondary | ICD-10-CM | POA: Diagnosis not present

## 2020-02-03 DIAGNOSIS — M6281 Muscle weakness (generalized): Secondary | ICD-10-CM | POA: Diagnosis not present

## 2020-02-03 DIAGNOSIS — N138 Other obstructive and reflux uropathy: Secondary | ICD-10-CM | POA: Diagnosis not present

## 2020-02-03 DIAGNOSIS — R4189 Other symptoms and signs involving cognitive functions and awareness: Secondary | ICD-10-CM | POA: Diagnosis not present

## 2020-02-03 DIAGNOSIS — E876 Hypokalemia: Secondary | ICD-10-CM

## 2020-02-03 DIAGNOSIS — R278 Other lack of coordination: Secondary | ICD-10-CM | POA: Diagnosis not present

## 2020-02-03 DIAGNOSIS — K219 Gastro-esophageal reflux disease without esophagitis: Secondary | ICD-10-CM | POA: Diagnosis not present

## 2020-02-03 DIAGNOSIS — Z8673 Personal history of transient ischemic attack (TIA), and cerebral infarction without residual deficits: Secondary | ICD-10-CM | POA: Diagnosis not present

## 2020-02-03 LAB — BASIC METABOLIC PANEL
Anion gap: 11 (ref 5–15)
Anion gap: 11 (ref 5–15)
BUN: 61 mg/dL — ABNORMAL HIGH (ref 8–23)
BUN: 65 mg/dL — ABNORMAL HIGH (ref 8–23)
CO2: 26 mmol/L (ref 22–32)
CO2: 27 mmol/L (ref 22–32)
Calcium: 8.9 mg/dL (ref 8.9–10.3)
Calcium: 9 mg/dL (ref 8.9–10.3)
Chloride: 97 mmol/L — ABNORMAL LOW (ref 98–111)
Chloride: 99 mmol/L (ref 98–111)
Creatinine, Ser: 2.24 mg/dL — ABNORMAL HIGH (ref 0.61–1.24)
Creatinine, Ser: 2.41 mg/dL — ABNORMAL HIGH (ref 0.61–1.24)
GFR, Estimated: 24 mL/min — ABNORMAL LOW (ref >60)
GFR, Estimated: 26 mL/min — ABNORMAL LOW (ref >60)
Glucose, Bld: 107 mg/dL — ABNORMAL HIGH (ref 70–99)
Glucose, Bld: 97 mg/dL (ref 70–99)
Potassium: 3.2 mmol/L — ABNORMAL LOW (ref 3.5–5.1)
Potassium: 3.2 mmol/L — ABNORMAL LOW (ref 3.5–5.1)
Sodium: 135 mmol/L (ref 135–145)
Sodium: 136 mmol/L (ref 135–145)

## 2020-02-03 LAB — ECHOCARDIOGRAM LIMITED
Height: 66 in
S' Lateral: 2.27 cm
Weight: 2800 oz

## 2020-02-03 LAB — MRSA PCR SCREENING: MRSA by PCR: NEGATIVE

## 2020-02-03 LAB — CBC
HCT: 36.1 % — ABNORMAL LOW (ref 39.0–52.0)
Hemoglobin: 11.8 g/dL — ABNORMAL LOW (ref 13.0–17.0)
MCH: 30 pg (ref 26.0–34.0)
MCHC: 32.7 g/dL (ref 30.0–36.0)
MCV: 91.9 fL (ref 80.0–100.0)
Platelets: 182 10*3/uL (ref 150–400)
RBC: 3.93 MIL/uL — ABNORMAL LOW (ref 4.22–5.81)
RDW: 14.2 % (ref 11.5–15.5)
WBC: 7.5 10*3/uL (ref 4.0–10.5)
nRBC: 0 % (ref 0.0–0.2)

## 2020-02-03 LAB — TROPONIN I (HIGH SENSITIVITY): Troponin I (High Sensitivity): 374 ng/L (ref <18)

## 2020-02-03 MED ORDER — FUROSEMIDE 80 MG PO TABS: 40.0000 mg | ORAL_TABLET | Freq: Every day | ORAL | 11 refills | Status: DC

## 2020-02-03 MED ORDER — POTASSIUM CHLORIDE CRYS ER 20 MEQ PO TBCR
40.0000 meq | EXTENDED_RELEASE_TABLET | Freq: Once | ORAL | Status: AC
  Administered 2020-02-03: 40 meq via ORAL
  Filled 2020-02-03: qty 2

## 2020-02-03 MED ORDER — POLYETHYLENE GLYCOL 3350 17 G PO PACK
17.0000 g | PACK | Freq: Every day | ORAL | 0 refills | Status: DC | PRN
Start: 2020-02-03 — End: 2020-08-13

## 2020-02-03 MED ORDER — POTASSIUM CHLORIDE ER 10 MEQ PO TBCR: 10.0000 meq | EXTENDED_RELEASE_TABLET | Freq: Every day | ORAL | 0 refills | Status: DC

## 2020-02-03 MED ORDER — SODIUM CHLORIDE 0.9 % IV SOLN
INTRAVENOUS | Status: DC | PRN
  Administered 2020-02-03: 500 mL via INTRAVENOUS

## 2020-02-03 MED ORDER — POTASSIUM CHLORIDE 10 MEQ/100ML IV SOLN
10.0000 meq | INTRAVENOUS | Status: AC
  Administered 2020-02-03 (×3): 10 meq via INTRAVENOUS
  Filled 2020-02-03 (×3): qty 100

## 2020-02-03 MED ORDER — CLONAZEPAM 0.5 MG PO TABS
0.5000 mg | ORAL_TABLET | Freq: Every day | ORAL | 0 refills | Status: DC
Start: 2020-02-03 — End: 2020-03-02

## 2020-02-03 NOTE — Discharge Summary (Addendum)
Physician Discharge Summary  Patient ID: Stephen Garrett MRN: [id number] DOB/AGE: Feb 02, 1924 86 y.o.  Admit date: 02/02/2020 Discharge date: 02/03/2020  Admission Diagnoses:  Discharge Diagnoses:  Active Problems:   BPH with obstruction/lower urinary tract symptoms   Restless legs syndrome   Hypokalemia   A-fib (HCC)   AKI (acute kidney injury) ([place])   Diarrhea   Discharged Condition: good  Hospital Course:  Stephen Garrett is a 86 y.o. male with medical history significant for hypertension, hyperlipidemia, atrial fibrillation on Eliquis, CKD, BPH, status post cochlear implant and spinal stenosis, TIAs, restless leg syndrome, GERD, bilateral hearing loss, presented to the emergency department for chief concerns of fall and weakness in his lower extremities and states this has been progressively worsened in the last 3 days. Patient was found to have a significant hypokalemia with potassium level 2.6.  Which has been supplemented oral and IV.  Potassium 3.2 today, he will receiving another 20 mEq IV potassium and 40 mEq oral potassium.  I resume his home diuretics, also prescribed 10 mEq potassium twice a day as outpatient.  #1.  Generalized weakness secondary to hypokalemia. Hypokalemia secondary to dehydration with recent diarrhea and diuretics. Condition had improved.  Patient feels stronger.  Potassium supplemented. Patient has been evaluated by physical therapy and Occupational Therapy. Patient be transferred to nursing home for rehab.  2.  Permanent atrial fibrillation. Patient is continued on anticoagulation.  Has been seen by cardiology.  3.  Elevated troponin. Nonsustained ventricular tachycardia. Chronic diastolic congestive heart failure. Patient has been seen by cardiology, deemed to be secondary to demand ischemia from his medical condition and chronic kidney disease. No work-up is indicated. On telemetry patient had 6 beats of ventricular tachycardia.  In the setting  of hypokalemia.  No additional work-up is indicated.  #4. Acute kidney injury on chronic kidney disease stage IIIb. Patient be followed by his family doctor in the near future.  Renal function is stable. I will reduce the dose of diuretics: Decrease Lasix to 40 mg daily. Please repeat a BMP in 3 to 4 days, may resume increased dose of Lasix if renal function is better.   Consults: None  Significant Diagnostic Studies:   Treatments: Potassium  Discharge Exam: Blood pressure (!) 155/89, pulse 78, temperature (!) 97.5 F (36.4 C), temperature source Oral, resp. rate 12, height 5\' 6"  (1.676 m), weight 79.4 kg, SpO2 99 %. General appearance: alert and cooperative Resp: clear to auscultation bilaterally Cardio: Irregular, no murmurs. GI: soft, non-tender; bowel sounds normal; no masses,  no organomegaly Extremities: extremities normal, atraumatic, no cyanosis or edema  Disposition: Discharge disposition: 03-Skilled Nursing Facility       Discharge Instructions    Diet - low sodium heart healthy   Complete by: As directed    Increase activity slowly   Complete by: As directed      Allergies as of 02/03/2020      Reactions   Atorvastatin    REACTION: raises blood pressure   Pravastatin Sodium    REACTION: raises blood pressure   Statins    REACTION: raises bp      Medication List    TAKE these medications   apixaban 2.5 MG Tabs tablet Commonly known as: ELIQUIS Take 1 tablet (2.5 mg total) by mouth 2 (two) times daily.   buPROPion 150 MG 24 hr tablet Commonly known as: WELLBUTRIN XL TAKE 1 TABLET BY MOUTH DAILY   clonazePAM 0.5 MG tablet Commonly known as: KLONOPIN Take 1-2  tablets (0.5-1 mg total) by mouth at bedtime. What changed: See the new instructions.   finasteride 5 MG tablet Commonly known as: PROSCAR TAKE ONE TABLET BY MOUTH EVERY DAY   furosemide 80 MG tablet Commonly known as: LASIX Take 1 tablet (80 mg total) by mouth daily.   losartan 50 MG  tablet Commonly known as: COZAAR Take 1 tablet (50 mg total) by mouth daily.   metolazone 2.5 MG tablet Commonly known as: ZAROXOLYN Take 1 tablet (2.5 mg total) by mouth daily as needed. What changed: reasons to take this   multivitamin capsule Take 1 capsule by mouth daily.   omeprazole 40 MG capsule Commonly known as: PRILOSEC TAKE 1 CAPSULE BY MOUTH ONCE DAILY   polyethylene glycol 17 g packet Commonly known as: MIRALAX / GLYCOLAX Take 17 g by mouth daily as needed. What changed:   when to take this  reasons to take this   potassium chloride 10 MEQ tablet Commonly known as: KLOR-CON Take 1 tablet (10 mEq total) by mouth daily.   pramipexole 1 MG tablet Commonly known as: MIRAPEX TAKE ONE TABLET AT DINNER AND ONE TABLETAT BEDTIME What changed: See the new instructions.   pyridoxine 100 MG tablet Commonly known as: B-6 Take 100 mg by mouth daily.   tamsulosin 0.4 MG Caps capsule Commonly known as: FLOMAX TAKE 1 CAPSULE EVERY DAY       Follow-up Information    Viviana Simpler I, MD Follow up in 1 week(s).   Specialties: Internal Medicine, Pediatrics Contact information: [place] [address] [phone]        Kate Sable, MD .   Specialties: Cardiology, Radiology Contact information: [place] [address] [phone]             Please note that Lasix has been changed to 40 mg daily.  Signed: Sharen Hones 02/03/2020, 1:15 PM

## 2020-02-03 NOTE — Telephone Encounter (Signed)
Called to let us know that the patient had a fall and is in the hospital. Patient also has low BP.

## 2020-02-03 NOTE — TOC Initial Note (Signed)
Transition of Care [facility]) - Initial/Assessment Note    Patient Details  Name: Stephen Garrett MRN: [id number] Date of Birth: [birth year]  Transition of Care [facility]) CM/SW Contact:    Candie Chroman, LCSW Phone Number: 02/03/2020, 1:01 PM  Clinical Narrative:  CSW met with patient. Daughter at bedside. CSW introduced role and explained PT recommendations. Patient lives at [place] and is agreeable to going to the SNF side for short-term rehab. Patient is under observation status but under Medicare waiver, they are able to accept him today. MD is aware. No further concerns. CSW encouraged patient and his daughter to contact CSW as needed. CSW will continue to follow patient and his daughter for support and facilitate discharge to SNF today.               Expected Discharge Plan: Skilled Nursing Facility Barriers to Discharge: No Barriers Identified   Patient Goals and CMS Choice     Choice offered to / list presented to : NA  Expected Discharge Plan and Services Expected Discharge Plan: [place] Choice: [place] Living arrangements for the past 2 months: [place]                                      Prior Living Arrangements/Services Living arrangements for the past 2 months: [place] Lives with:: Self Patient language and need for interpreter reviewed:: Yes Do you feel safe going back to the place where you live?: Yes      Need for Family Participation in Patient Care: Yes (Comment) Care giver support system in place?: Yes (comment) Current home services: Home PT Criminal Activity/Legal Involvement Pertinent to Current Situation/Hospitalization: No - Comment as needed  Activities of Daily Living Home Assistive Devices/Equipment: None (denies) ADL Screening (condition at time of admission) Patient's cognitive ability adequate to safely complete daily activities?: Yes Is the  patient deaf or have difficulty hearing?: Yes Does the patient have difficulty seeing, even when wearing glasses/contacts?: Yes Does the patient have difficulty concentrating, remembering, or making decisions?: Yes (trouble remembering only) Patient able to express need for assistance with ADLs?: Yes Does the patient have difficulty dressing or bathing?: No Independently performs ADLs?: Yes (appropriate for developmental age) Does the patient have difficulty walking or climbing stairs?: No Weakness of Legs: Both Weakness of Arms/Hands: None  Permission Sought/Granted Permission sought to share information with : Facility Sport and exercise psychologist, Family Supports Permission granted to share information with : Yes, Verbal Permission Granted  Share Information with NAME: Cornell Barman  Permission granted to share info w AGENCY: [facility]  Permission granted to share info w Relationship: Daughter  Permission granted to share info w Contact Information: [phone]  Emotional Assessment Appearance:: Appears stated age Attitude/Demeanor/Rapport: Engaged, Gracious Affect (typically observed): Accepting, Appropriate, Calm, Pleasant Orientation: : Oriented to Self, Oriented to Place, Oriented to  Time, Oriented to Situation Alcohol / Substance Use: Not Applicable Psych Involvement: No (comment)  Admission diagnosis:  Hypokalemia [E87.6] Elevated troponin [R77.8] Acute kidney injury [facility]) [N17.9] Patient Active Problem List   Diagnosis Date Noted  . AKI (acute kidney injury) ([place]) 02/02/2020  . Diarrhea 02/02/2020  . Chronic diastolic heart failure ([place]) 01/14/2020  . Acute renal failure superimposed on stage 3 chronic kidney disease ([place]) 01/01/2020  . Hypokalemia 01/01/2020  . NSTEMI (non-ST elevated myocardial infarction) (  [place]) 01/01/2020  . A-fib ([place]) 01/01/2020  . Diplopia 09/25/2017  . TIA (transient ischemic attack) 09/14/2017  . Retinal vein occlusion, central  02/19/2014  . Chronic venous insufficiency 02/19/2014  . Episodic mood disorder ([place]) 12/20/2013  . Advanced directives, counseling/discussion 12/20/2013  . Routine general medical examination at a health care facility 11/02/2012  . Stage 3b chronic kidney disease ([place]) 01/26/2009  . Restless legs syndrome 10/03/2008  . OSTEOARTHRITIS 12/12/2006  . SPINAL STENOSIS, LUMBAR 06/24/2006  . Essential hypertension, benign 06/21/2006  . GERD 06/21/2006  . DIVERTICULOSIS, COLON 06/21/2006  . BPH with obstruction/lower urinary tract symptoms 06/21/2006   PCP:  [name], MD Pharmacy:   [place], [place] - [place] [place] [address] Phone: [phone] Fax: [phone]     Social Determinants of Health (SDOH) Interventions    Readmission Risk Interventions No flowsheet data found.

## 2020-02-03 NOTE — Clinical Social Work Note (Addendum)
CSW acknowledges consult that patient was admitted from a facility. Patient lives at [place]. He is active with [facility] for home health PT. CSW will follow for discharge needs.  Dayton Scrape, CSW [phone]  11:42 am: CSW acknowledges SNF consult. MD has entered PT and OT orders. Will follow for recommendations.  Dayton Scrape, [place]

## 2020-02-03 NOTE — Telephone Encounter (Signed)
Yes---I just spoke to his daughter who is with him in the hospital. Hopefully going to rehab at [facility] later today

## 2020-02-03 NOTE — NC FL2 (Signed)
[place] LEVEL OF CARE SCREENING TOOL     IDENTIFICATION  Patient Name: Stephen Garrett Birthdate: 1923/05/23 Sex: male Admission Date (Current Location): 02/02/2020  [place] and [place] Number:  Engineering geologist and Address:  [facility], [address] [place], [place] [address]      Provider Number: [id number]  Attending Physician Name and Address:  Sharen Hones, MD  Relative Name and Phone Number:       Current Level of Care: Hospital Recommended Level of Care: [place] Prior Approval Number:    Date Approved/Denied:   PASRR Number: [id number]  Discharge Plan: SNF    Current Diagnoses: Patient Active Problem List   Diagnosis Date Noted  . AKI (acute kidney injury) ([place]) 02/02/2020  . Diarrhea 02/02/2020  . Chronic diastolic heart failure ([place]) 01/14/2020  . Acute renal failure superimposed on stage 3 chronic kidney disease ([place]) 01/01/2020  . Hypokalemia 01/01/2020  . NSTEMI (non-ST elevated myocardial infarction) ([place]) 01/01/2020  . A-fib ([place]) 01/01/2020  . Diplopia 09/25/2017  . TIA (transient ischemic attack) 09/14/2017  . Retinal vein occlusion, central 02/19/2014  . Chronic venous insufficiency 02/19/2014  . Episodic mood disorder ([place]) 12/20/2013  . Advanced directives, counseling/discussion 12/20/2013  . Routine general medical examination at a health care facility 11/02/2012  . Stage 3b chronic kidney disease ([place]) 01/26/2009  . Restless legs syndrome 10/03/2008  . OSTEOARTHRITIS 12/12/2006  . SPINAL STENOSIS, LUMBAR 06/24/2006  . Essential hypertension, benign 06/21/2006  . GERD 06/21/2006  . DIVERTICULOSIS, COLON 06/21/2006  . BPH with obstruction/lower urinary tract symptoms 06/21/2006    Orientation RESPIRATION BLADDER Height & Weight     Self, Time, Situation, Place  Normal Incontinent Weight: 175 lb (79.4 kg) Height:  5\' 6"  (167.6 cm)  BEHAVIORAL SYMPTOMS/MOOD NEUROLOGICAL  BOWEL NUTRITION STATUS   (None)  (None) Continent Diet (Heart healthy)  AMBULATORY STATUS COMMUNICATION OF NEEDS Skin   Limited Assist Verbally Skin abrasions, Bruising                       Personal Care Assistance Level of Assistance  Bathing, Feeding, Dressing Bathing Assistance: Limited assistance Feeding assistance: Limited assistance Dressing Assistance: Limited assistance     Functional Limitations Info  Sight, Hearing, Speech Sight Info: Adequate Hearing Info: Impaired (Hearing aides) Speech Info: Adequate    SPECIAL CARE FACTORS FREQUENCY  PT (By licensed PT), OT (By licensed OT)     PT Frequency: 5 x week OT Frequency: 5 x week            Contractures Contractures Info: Not present    Additional Factors Info  Code Status, Allergies Code Status Info: Full code Allergies Info: Atorvastatin, Pravastatin Sodium, Statins           Current Medications (02/03/2020):  This is the current hospital active medication list Current Facility-Administered Medications  Medication Dose Route Frequency Provider Last Rate Last Admin  . 0.9 %  sodium chloride infusion   Intravenous PRN Cox, Amy N, DO   Stopped at 02/03/20 0112  . acetaminophen (TYLENOL) tablet 325 mg  325 mg Oral Q6H PRN Cox, Amy N, DO       Or  . acetaminophen (TYLENOL) suppository 325 mg  325 mg Rectal Q6H PRN Cox, Amy N, DO      . apixaban (ELIQUIS) tablet 2.5 mg  2.5 mg Oral BID Cox, Amy N, DO   2.5 mg at 02/03/20 1138  . buPROPion (  WELLBUTRIN XL) 24 hr tablet 150 mg  150 mg Oral Daily Cox, Amy N, DO   150 mg at 02/03/20 1138  . clonazePAM (KLONOPIN) tablet 0.5-1 mg  0.5-1 mg Oral QHS Cox, Amy N, DO   1 mg at 02/02/20 2157  . hydrALAZINE (APRESOLINE) tablet 25 mg  25 mg Oral Q6H PRN Cox, Amy N, DO      . multivitamin with minerals tablet 1 tablet  1 tablet Oral Daily Cox, Amy N, DO   1 tablet at 02/03/20 1138  . ondansetron (ZOFRAN) tablet 4 mg  4 mg Oral Q6H PRN Cox, Amy N, DO       Or  .  ondansetron (ZOFRAN) injection 4 mg  4 mg Intravenous Q6H PRN Cox, Amy N, DO      . pantoprazole (PROTONIX) EC tablet 80 mg  80 mg Oral Daily Cox, Amy N, DO   80 mg at 02/03/20 1138  . potassium chloride 10 mEq in 100 mL IVPB  10 mEq Intravenous Q1 Hr x 3 Sharen Hones, MD 100 mL/hr at 02/03/20 1240 10 mEq at 02/03/20 1240  . potassium chloride SA (KLOR-CON) CR tablet 40 mEq  40 mEq Oral Once Sharen Hones, MD      . pramipexole (MIRAPEX) tablet 1 mg  1 mg Oral 2 times per day Cox, Amy N, DO   1 mg at 02/02/20 2327     Discharge Medications: Please see discharge summary for a list of discharge medications.  Relevant Imaging Results:  Relevant Lab Results:   Additional Information SS#: [id number]  Candie Chroman, LCSW

## 2020-02-03 NOTE — Evaluation (Addendum)
Physical Therapy Evaluation Patient Details Name: Stephen Garrett MRN: [id number] DOB: [birth year] Today's Date: 02/03/2020   History of Present Illness   86 y.o. male with medical history significant for hypertension, hyperlipidemia, atrial fibrillation on Eliquis, CKD, BPH, status post cochlear implant and spinal stenosis, TIAs, restless leg syndrome, GERD, bilateral hearing loss, presented to the emergency department for chief concerns of fall and weakness in his lower extremities. Pt was her 1 month ago after fall.  Clinical Impression  Pt eager to get up and do some activity in the room.  He ultimately was able to get up (with some assistance) heavily reliant on the walker and far from his baseline (with no ADs).  Pt with mild fatigue after the effort, but generally safe and stable regarding vitals, etc.  He acknowledges that he is not near his baseline or appropriate to be home alone at this time and is eager to work on strength, safety, etc at rehab for a while before transitioning back to his ILF.  Spoke with daughter about possible transition to ALF vs increased assistance from family, paid, etc to remain in ILF after course of rehab is through.   Follow Up Recommendations SNF    Equipment Recommendations   (TBD at next venue of care)    Recommendations for Other Services       Precautions / Restrictions Precautions Precautions: Fall Restrictions Weight Bearing Restrictions: No      Mobility  Bed Mobility Overal bed mobility: Needs Assistance Bed Mobility: Supine to Sit     Supine to sit: Min assist;Mod assist     General bed mobility comments: Pt able to start edging feet to EOB, but unable to get torso elevating w/o considerable PT assist    Transfers Overall transfer level: Needs assistance Equipment used: Rolling walker (2 wheeled) Transfers: Sit to/from Stand Sit to Stand: Min assist         General transfer comment: Pt leaning back of LEs heavily on bed when  getting to standing, all the weight on his heels.  Needed light assist to insure keeping weight forward and very slow, labored transition otherwise  Ambulation/Gait Ambulation/Gait assistance: Min assist Gait Distance (Feet): 65 Feet Assistive device: Rolling walker (2 wheeled)       General Gait Details: Pt with slow, walker reliant ambulation.  He would not have been stable with his baseline no AD attempt.  Pt's HR slowly rose from 80s to ~100, but he was not overly fatigued and ultimately he did quite well, all be it far from his baseline.  Stairs            Wheelchair Mobility    Modified Rankin (Stroke Patients Only)       Balance Overall balance assessment: Modified Independent                                           Pertinent Vitals/Pain      Home Living Family/patient expects to be discharged to:: Skilled nursing facility                      Prior Function Level of Independence: Independent         Comments: ILF, drives, runs errands, had been playing tennis a few months ago     Hand Dominance        Extremity/Trunk Assessment  Upper Extremity Assessment Upper Extremity Assessment: Overall WFL for tasks assessed;Generalized weakness    Lower Extremity Assessment Lower Extremity Assessment: Overall WFL for tasks assessed;Generalized weakness (L LE swollen, functional but weaker than R)       Communication   Communication: HOH  Cognition Arousal/Alertness: Awake/alert Behavior During Therapy: WFL for tasks assessed/performed Overall Cognitive Status: Within Functional Limits for tasks assessed                                        General Comments General comments (skin integrity, edema, etc.): 5x STS >90 seconds, did not get to full upright on a few stands    Exercises     Assessment/Plan    PT Assessment Patient needs continued PT services  PT Problem List Decreased strength;Decreased  range of motion;Decreased balance;Decreased activity tolerance;Decreased knowledge of use of DME;Decreased safety awareness;Decreased mobility       PT Treatment Interventions DME instruction;Gait training;Stair training;Functional mobility training;Therapeutic activities;Therapeutic exercise;Balance training;Neuromuscular re-education;Patient/family education    PT Goals (Current goals can be found in the Care Plan section)  Acute Rehab PT Goals Patient Stated Goal: get strong enough to get back to ILF PT Goal Formulation: With patient/family Time For Goal Achievement: 02/17/20 Potential to Achieve Goals: Fair    Frequency Min 2X/week   Barriers to discharge        Co-evaluation               AM-PAC PT "6 Clicks" Mobility  Outcome Measure Help needed turning from your back to your side while in a flat bed without using bedrails?: A Little Help needed moving from lying on your back to sitting on the side of a flat bed without using bedrails?: A Lot Help needed moving to and from a bed to a chair (including a wheelchair)?: A Little Help needed standing up from a chair using your arms (e.g., wheelchair or bedside chair)?: A Lot Help needed to walk in hospital room?: A Lot Help needed climbing 3-5 steps with a railing? : A Lot 6 Click Score: 14    End of Session Equipment Utilized During Treatment: Gait belt Activity Tolerance: Patient limited by fatigue Patient left: with chair alarm set;with call bell/phone within reach;with family/visitor present Nurse Communication: Mobility status PT Visit Diagnosis: Difficulty in walking, not elsewhere classified (R26.2);Muscle weakness (generalized) (M62.81)    Time: 1150-1250 PT Time Calculation (min) (ACUTE ONLY): 60 min   Charges:   PT Evaluation $PT Eval Low Complexity: 1 Low PT Treatments $Gait Training: 8-22 mins $Therapeutic Activity: 8-22 mins        Kreg Shropshire, DPT 02/03/2020, 1:49 PM

## 2020-02-03 NOTE — TOC Transition Note (Signed)
Transition of Care [facility]) - CM/SW Discharge Note   Patient Details  Name: Stephen Garrett MRN: [id number] Date of Birth: 1923/03/03  Transition of Care [facility]) CM/SW Contact:  Candie Chroman, LCSW Phone Number: 02/03/2020, 2:09 PM   Clinical Narrative: Patient has orders to discharge to [facility] today. RN will call report to [phone] (Room [address] EMS transport set up for 3:00. No further concerns. CSW signing off.    Final next level of care: [place] Barriers to Discharge: No Barriers Identified   Patient Goals and CMS Choice     Choice offered to / list presented to : Patient, Adult Children  Discharge Placement   Existing PASRR number confirmed : 02/03/20          Patient chooses bed at: [facility] Patient to be transferred to facility by: EMS Name of family member notified: Cornell Barman Patient and family notified of of transfer: 02/03/20  Discharge Plan and Services     Post Acute Care Choice: [place]                               Social Determinants of Health (SDOH) Interventions     Readmission Risk Interventions No flowsheet data found.

## 2020-02-03 NOTE — Progress Notes (Signed)
*  PRELIMINARY RESULTS* Echocardiogram 2D Echocardiogram has been performed.  Stephen Garrett 02/03/2020, 1:59 PM

## 2020-02-03 NOTE — Consult Note (Signed)
Cardiology Consultation:   Patient ID: Stephen Garrett; [id number]; [birth year]   Admit date: 02/02/2020 Date of Consult: 02/03/2020  Primary Care Provider: [name], MD Primary Cardiologist: Agbor-Etang Primary Electrophysiologist:  None   Patient Profile:   Stephen Garrett is a 86 y.o. male with a hx of recently diagnosed Afib on Eliquis, TIAs, CKD stage III, HTN, HLD, anemia of chronic disease, BPH, hard of hearing s/p cochlear implant, spinal stenosis, remote tobacco use, and GERD who is being seen today for the evaluation of medication reconciliation at the request of Dr. Tobie Poet.  History of Present Illness:   Stephen Garrett was recently admitted in 12/2019 with fatigue with associated presyncope. He was noted to be in newly diagnosed Afib with controlled ventricular response. High sensitivity troponin peaked around 350 and was felt to be supply demand ischemia. He was without anginal symptoms. Echo showed an EF of 55-60% with severely dilated left atrium. He was placed on renally dosed Eliquis. There were no plans for ischemic evaluation.   He returned to the hospital on 12/5 with a 3 day history of worsening weakness in his lower extremities as well as ongoing watery diarrhea that had been present since before his admission in 12/2019.   Upon the patient's arrival to [facility] they were found to have BP 409W to 119J systolic, HR 47W to 29F bpm, temp afebrile, oxygen saturation 100% on room air, weight 79.4 kg. EKG showed Afib, 64 bpm, PVC, nonspecific lateral st/t changes, CXR showed no acute abnormality, CT head/cervial spine were not acute. Labs showed BUN/SCr 68/2.61 trending to 61/2.24, HS-Tn 458 with a down trending delta troponin of 409, potassium 2.6 trending to 3.2, magnesium 2.1, WBC 9.0, HGB 12.0, covid and influenza negative. In the ED he received IV fluids and IV/oral potassium. Cardiology is asked to evaluate his elevated troponin. He denies any chest pain, dyspnea, or  palpitations. He continues to note mild ankle edema. He denies any diarrhea, though states he just had a BM 15 minutes prior to me seeing him. In talking with his NT, he has not had a BM today. He indicates he wants to be discharged.   Past Medical History:  Diagnosis Date  . Actinic keratosis   . BPH (benign prostatic hypertrophy)   . Diseases of lips   . Diverticulosis of colon 06/2003  . ED (erectile dysfunction)   . GERD (gastroesophageal reflux disease)   . Heart murmur    as child  . HLD (hyperlipidemia)   . HOH (hard of hearing)    Cochlear implant right, hearing aid left  . HTN (hypertension)   . Numbness and tingling in both hands    intermittent, trying braces   . Osteoarthritis of knee    severe, bilat  . Renal insufficiency   . Restless legs syndrome (RLS)   . Sleep disturbance, unspecified   . Spinal stenosis, lumbar region, without neurogenic claudication     Past Surgical History:  Procedure Laterality Date  . CATARACT EXTRACTION, BILATERAL  2011   Dr. Thomasene Ripple  . COCHLEAR IMPLANT Right 02/15/2017   Procedure: RIGHT COCHLEAR IMPLANT;  Surgeon: Vicie Mutters, MD;  Location: [place];  Service: ENT;  Laterality: Right;  . FINGER SURGERY     Right 4th finger  . [facility]  10/2009   Dr. Bary Castilla  . LUMBAR SPINE SURGERY  06/2005   stenosis repair  . PTOSIS REPAIR Bilateral 04/19/2019   Procedure: BLEPHAROPTOSIS REPAIR; RESECT SKIN;  Surgeon: Karle Starch, MD;  Location: [place];  Service: Ophthalmology;  Laterality: Bilateral;  . TONSILLECTOMY  childhood  . TOTAL KNEE ARTHROPLASTY  06/2008   bilat (Dr. Marry Guan)     Home Meds: Prior to Admission medications   Medication Sig Start Date End Date Taking? Authorizing Provider  apixaban (ELIQUIS) 2.5 MG TABS tablet Take 1 tablet (2.5 mg total) by mouth 2 (two) times daily. 01/05/20  Yes Dessa Phi, DO  buPROPion (WELLBUTRIN XL) 150 MG 24 hr tablet TAKE 1 TABLET BY MOUTH DAILY Patient taking  differently: Take 150 mg by mouth daily.  03/18/19  Yes [name], MD  clonazePAM (KLONOPIN) 0.5 MG tablet TAKE ONE TO TWO TABLETS AT BEDTIME Patient taking differently: Take 0.5-1 mg by mouth at bedtime.  10/22/19  Yes [name], MD  finasteride (PROSCAR) 5 MG tablet TAKE ONE TABLET BY MOUTH EVERY DAY Patient taking differently: Take 5 mg by mouth daily.  08/05/19  Yes [name], MD  furosemide (LASIX) 80 MG tablet Take 1 tablet (80 mg total) by mouth daily. 01/29/20  Yes [name], MD  losartan (COZAAR) 50 MG tablet Take 1 tablet (50 mg total) by mouth daily. 01/06/20 04/05/20 Yes Agbor-Etang, Aaron Edelman, MD  metolazone (ZAROXOLYN) 2.5 MG tablet Take 1 tablet (2.5 mg total) by mouth daily as needed. Patient taking differently: Take 2.5 mg by mouth daily as needed (excessive fluid retention).  01/29/20  Yes [name], MD  Multiple Vitamin (MULTIVITAMIN) capsule Take 1 capsule by mouth daily.     Yes [provider]  omeprazole (PRILOSEC) 40 MG capsule TAKE 1 CAPSULE BY MOUTH ONCE DAILY Patient taking differently: Take 40 mg by mouth daily.  04/25/19  Yes [name], MD  polyethylene glycol (MIRALAX / GLYCOLAX) packet Take 17 g by mouth daily.   Yes [provider]  pramipexole (MIRAPEX) 1 MG tablet TAKE ONE TABLET AT DINNER AND ONE TABLETAT BEDTIME Patient taking differently: Take 1 mg by mouth See admin instructions. Take 1 tablet (1mg ) by mouth at suppertime and take 1 tablet (1mg ) by mouth at bedtime 09/16/19  Yes [name], MD  pyridoxine (B-6) 100 MG tablet Take 100 mg by mouth daily.   Yes [provider]  tamsulosin (FLOMAX) 0.4 MG CAPS capsule TAKE 1 CAPSULE EVERY DAY Patient taking differently: Take 0.4 mg by mouth daily.  08/05/19  Yes [name], MD  citalopram (CELEXA) 10 MG tablet Take 1 tablet (10 mg total) by mouth daily. 04/22/11 05/18/11  [name], MD    Inpatient Medications: Scheduled Meds: .  apixaban  2.5 mg Oral BID  . buPROPion  150 mg Oral Daily  . clonazePAM  0.5-1 mg Oral QHS  . multivitamin with minerals  1 tablet Oral Daily  . pantoprazole  80 mg Oral Daily  . pramipexole  1 mg Oral 2 times per day   Continuous Infusions: . sodium chloride Stopped (02/03/20 0112)  . potassium chloride     PRN Meds: sodium chloride, acetaminophen **OR** acetaminophen, hydrALAZINE, ondansetron **OR** ondansetron (ZOFRAN) IV  Allergies:   Allergies  Allergen Reactions  . Atorvastatin     REACTION: raises blood pressure  . Pravastatin Sodium     REACTION: raises blood pressure  . Statins     REACTION: raises bp    Social History:   Social History   Socioeconomic History  . Marital status: Widowed    Spouse name: Not on file  .  Number of children: 2  . Years of education: Not on file  . Highest education level: Not on file  Occupational History  . Occupation: Retired  Tobacco Use  . Smoking status: Former Smoker    Packs/day: 0.75    Years: 30.00    Pack years: 22.50    Quit date: 02/28/1970    Years since quitting: 49.9  . Smokeless tobacco: Never Used  Vaping Use  . Vaping Use: Never used  Substance and Sexual Activity  . Alcohol use: Yes    Alcohol/week: 0.0 standard drinks    Comment: 1-2 glasses wine/daily  . Drug use: No  . Sexual activity: Not on file  Other Topics Concern  . Not on file  Social History Narrative   Widowed 2010   2 adopted children   Has living will   Daughter is now health care power of attorney   Would accept resuscitation but no prolonged ventilation   No tube feeds if cognitively unaware   Social Determinants of Health   Financial Resource Strain:   . Difficulty of Paying Living Expenses: Not on file  Food Insecurity:   . Worried About Charity fundraiser in the Last Year: Not on file  . Ran Out of Food in the Last Year: Not on file  Transportation Needs:   . Lack of Transportation (Medical): Not on file  . Lack of  Transportation (Non-Medical): Not on file  Physical Activity:   . Days of Exercise per Week: Not on file  . Minutes of Exercise per Session: Not on file  Stress:   . Feeling of Stress : Not on file  Social Connections:   . Frequency of Communication with Friends and Family: Not on file  . Frequency of Social Gatherings with Friends and Family: Not on file  . Attends Religious Services: Not on file  . Active Member of Clubs or Organizations: Not on file  . Attends Archivist Meetings: Not on file  . Marital Status: Not on file  Intimate Partner Violence:   . Fear of Current or Ex-Partner: Not on file  . Emotionally Abused: Not on file  . Physically Abused: Not on file  . Sexually Abused: Not on file     Family History:   Family History  Problem Relation Age of Onset  . Other Father        "clogged arteries"  . Hypertension Mother     ROS:  Review of Systems  Constitutional: Positive for malaise/fatigue. Negative for chills, diaphoresis, fever and weight loss.  HENT: Negative for congestion.   Eyes: Negative for discharge and redness.  Respiratory: Negative for cough, hemoptysis, sputum production, shortness of breath and wheezing.   Cardiovascular: Positive for leg swelling. Negative for chest pain, palpitations, orthopnea, claudication and PND.  Gastrointestinal: Positive for diarrhea. Negative for abdominal pain, blood in stool, constipation, heartburn, melena, nausea and vomiting.  Genitourinary: Negative for hematuria.  Musculoskeletal: Positive for falls. Negative for myalgias.  Skin: Negative for rash.  Neurological: Positive for weakness. Negative for dizziness, tingling, tremors, sensory change, speech change, focal weakness and loss of consciousness.  Endo/Heme/Allergies: Does not bruise/bleed easily.  Psychiatric/Behavioral: Negative for substance abuse. The patient is not nervous/anxious.   All other systems reviewed and are negative.     Physical  Exam/Data:   Vitals:   02/02/20 2057 02/02/20 2311 02/03/20 0531 02/03/20 0759  BP: 140/71 128/79 (!) 164/78 (!) 150/85  Pulse: 81 61 63 66  Resp: 14  19 18 12   Temp:  97.7 F (36.5 C) 97.8 F (36.6 C) (!) 97.5 F (36.4 C)  TempSrc:  Oral  Oral  SpO2: 96% 100% 99% 99%  Weight:      Height:        Intake/Output Summary (Last 24 hours) at 02/03/2020 0909 Last data filed at 02/03/2020 0500 Gross per 24 hour  Intake 1410.85 ml  Output 650 ml  Net 760.85 ml   Filed Weights   02/02/20 1452  Weight: 79.4 kg   Body mass index is 28.25 kg/m.   Physical Exam: General: Elderly appearing. Well developed, well nourished, in no acute distress. Head: Normocephalic, atraumatic, sclera non-icteric, no xanthomas, nares without discharge. HOH.   Neck: Negative for carotid bruits. JVD not elevated. Lungs: Clear bilaterally to auscultation without wheezes, rales, or rhonchi. Breathing is unlabored. Heart: IRIR with S1 S2. No murmurs, rubs, or gallops appreciated. Abdomen: Soft, non-tender, non-distended with normoactive bowel sounds. No hepatomegaly. No rebound/guarding. No obvious abdominal masses. Msk:  Strength and tone appear normal for age. Extremities: No clubbing or cyanosis. 1+ bilateral lower extremity pitting edema. Distal pedal pulses are 2+ and equal bilaterally. Neuro: Alert and oriented X 3. No facial asymmetry. No focal deficit. Moves all extremities spontaneously. Psych:  Responds to questions appropriately with a normal affect.   EKG:  The EKG was personally reviewed and demonstrates: Afib, 64 bpm, PVC, nonspecific lateral st/t changes Telemetry:  Telemetry was personally reviewed and demonstrates: Afib with ventricular rates in the 60s bpm  Weights: Filed Weights   02/02/20 1452  Weight: 79.4 kg    Relevant CV Studies:  2D echo 01/01/2020: 1. Left ventricular ejection fraction, by estimation, is 55 to 60%. The  left ventricle has normal function. The left ventricle  has no regional  wall motion abnormalities. There is moderate to severe left ventricular  hypertrophy.  2. Right ventricular systolic function is normal. The right ventricular  size is normal. There is mildly elevated pulmonary artery systolic  pressure. The estimated right ventricular systolic pressure is 67.6 mmHg.  3. Left atrial size was severely dilated.  4. Mild mitral valve regurgitation.  5. The aortic valve is heavily calcified. Mild aortic valve stenosis.  6. The inferior vena cava is normal in size with <50% respiratory  variability, suggesting right atrial pressure of 8 mmHg.  7. Rhythm is atrial fibrillation __________  2D echo 09/15/2017: - Left ventricle: The cavity size was normal. There was mild  concentric hypertrophy. Systolic function was normal. The  estimated ejection fraction was in the range of 55% to 60%. Wall  motion was normal; there were no regional wall motion  abnormalities. Doppler parameters are consistent with abnormal  left ventricular relaxation (grade 1 diastolic dysfunction).  - Left atrium: The atrium was mildly dilated.  - Right ventricle: Systolic function was normal.  - Atrial septum: A patent foramen ovale cannot be excluded. Saline  contrast bubble study not performed.  - Pulmonary arteries: Systolic pressure was within the normal  range.   Impressions:   - No cardiac source of emboli was indentified. __________  2D echo 05/07/2015: - Left ventricle: The cavity size was normal. There was mild focal  basal and moderate concentric hypertrophy of the septum. Systolic  function was normal. The estimated ejection fraction was in the  range of 60% to 65%. Wall motion was normal; there were no  regional wall motion abnormalities. Doppler parameters are  consistent with abnormal left ventricular relaxation (grade 1  diastolic dysfunction).  - Mitral valve: Mildly calcified, thickened annulus. There was mild   regurgitation.  - Left atrium: The atrium was mildly dilated.  - Right ventricle: Systolic function was normal.  - Pulmonary arteries: Systolic pressure was mildly dilated. PA peak  pressure: 43 mm Hg (S).  Laboratory Data:  Chemistry Recent Labs  Lab 02/02/20 1508 02/02/20 2336 02/03/20 0418  NA 133* 135 136  K 2.6* 3.2* 3.2*  CL 93* 97* 99  CO2 28 27 26   GLUCOSE 93 107* 97  BUN 68* 65* 61*  CREATININE 2.61* 2.41* 2.24*  CALCIUM 9.1 8.9 9.0  GFRNONAA 22* 24* 26*  ANIONGAP 12 11 11     No results for input(s): PROT, ALBUMIN, AST, ALT, ALKPHOS, BILITOT in the last 168 hours. Hematology Recent Labs  Lab 02/02/20 1508 02/03/20 0418  WBC 9.0 7.5  RBC 3.98* 3.93*  HGB 12.0* 11.8*  HCT 35.7* 36.1*  MCV 89.7 91.9  MCH 30.2 30.0  MCHC 33.6 32.7  RDW 14.3 14.2  PLT 196 182   Cardiac EnzymesNo results for input(s): TROPONINI in the last 168 hours. No results for input(s): TROPIPOC in the last 168 hours.  BNPNo results for input(s): BNP, PROBNP in the last 168 hours.  DDimer No results for input(s): DDIMER in the last 168 hours.  Radiology/Studies:  DG Chest 1 View  Result Date: 02/02/2020 IMPRESSION: Chronic cardiomegaly. No acute abnormality noted. Electronically Signed   By: Inez Catalina M.D.   On: 02/02/2020 16:33   CT Head Wo Contrast  Result Date: 02/02/2020 IMPRESSION: HEAD CT 1. No acute intracranial abnormalities. 2. Age related volume loss and chronic microvascular ischemic change. CERVICAL CT 1. No fracture or acute finding. 2. Advanced degenerative changes. Electronically Signed   By: Lajean Manes M.D.   On: 02/02/2020 17:06   CT Cervical Spine Wo Contrast  Result Date: 02/02/2020 IMPRESSION: HEAD CT 1. No acute intracranial abnormalities. 2. Age related volume loss and chronic microvascular ischemic change. CERVICAL CT 1. No fracture or acute finding. 2. Advanced degenerative changes. Electronically Signed   By: Lajean Manes M.D.   On: 02/02/2020 17:06     Assessment and Plan:   1. Elevated high sensitivity troponin: -Never with angina or dyspnea  -Peaked at 458 and is down trending -Likely supply demand ischemia in the setting of diarrhea with volume depletion, acute on CKD stage III and fall -Recent admission in 12/2019 with elevated troponin peaking at 385 without anginal symptoms at that time as well. Felt to be related to supply demand ischemia  -No plans for inpatient ischemic evaluation given lack of anginal symptoms and in the context of his presenting symptoms associated with diarrhea, dehydration, and generalized weakness associated with these issues -Check limited echo to evaluate LVSF and wall motion, if these are normal, no plans for further inpatient testing  2. Persistent Afib: -Ventricular rates are well controlled without aid of pharmacotherapy  -No plans for rhythm control given he is asymptomatic, advanced age, and with known severely dilated left atrium -Continue rate control  -CHADS2VASc at least 5 (HTN, age x 2, TIA x 2) -Eliquis 2.5 mg bid (age, SCr)  3. Generalized weakness/fall/diarrhea: -Patient denies diarrhea  -Per IM  4. Acute on CKD stage III: -Likely in the setting of ATN with dehydration associated with diarrhea and diuresis  -Hold losartan, Lasix, and metolazone for now -Avoid nephrotoxic medications/agents -Gentle hydration   5. Anemia of chronic disease: -Stable  6. Hypokalemia: -Replete to goal 4.0 -Magnesium  at goal   For questions or updates, please contact [place] Please consult [email or web address] for contact info under Cardiology/STEMI.   Signed, Christell Faith, PA-C [place] Pager: [phone] 02/03/2020, 9:09 AM

## 2020-02-04 DIAGNOSIS — N400 Enlarged prostate without lower urinary tract symptoms: Secondary | ICD-10-CM | POA: Diagnosis not present

## 2020-02-04 DIAGNOSIS — I4819 Other persistent atrial fibrillation: Secondary | ICD-10-CM

## 2020-02-04 DIAGNOSIS — I5032 Chronic diastolic (congestive) heart failure: Secondary | ICD-10-CM

## 2020-02-04 DIAGNOSIS — F39 Unspecified mood [affective] disorder: Secondary | ICD-10-CM

## 2020-02-04 DIAGNOSIS — R531 Weakness: Secondary | ICD-10-CM

## 2020-02-05 ENCOUNTER — Ambulatory Visit: Payer: Medicare Other | Admitting: Internal Medicine

## 2020-02-11 DIAGNOSIS — N184 Chronic kidney disease, stage 4 (severe): Secondary | ICD-10-CM

## 2020-02-14 ENCOUNTER — Ambulatory Visit (INDEPENDENT_AMBULATORY_CARE_PROVIDER_SITE_OTHER): Payer: Medicare Other | Admitting: Cardiology

## 2020-02-14 ENCOUNTER — Encounter: Payer: Self-pay | Admitting: Cardiology

## 2020-02-14 ENCOUNTER — Other Ambulatory Visit: Payer: Self-pay

## 2020-02-14 VITALS — BP 122/76 | HR 70 | Ht 66.0 in | Wt 170.0 lb

## 2020-02-14 DIAGNOSIS — E7800 Pure hypercholesterolemia, unspecified: Secondary | ICD-10-CM | POA: Diagnosis not present

## 2020-02-14 DIAGNOSIS — I4821 Permanent atrial fibrillation: Secondary | ICD-10-CM

## 2020-02-14 DIAGNOSIS — I350 Nonrheumatic aortic (valve) stenosis: Secondary | ICD-10-CM

## 2020-02-14 DIAGNOSIS — I10 Essential (primary) hypertension: Secondary | ICD-10-CM | POA: Diagnosis not present

## 2020-02-14 DIAGNOSIS — R600 Localized edema: Secondary | ICD-10-CM | POA: Diagnosis not present

## 2020-02-14 NOTE — Progress Notes (Signed)
Cardiology Office Note:    Date:  02/14/2020   ID:  Stephen Garrett, DOB 08/09/1923, MRN [id number]  PCP:  [name], MD  [facility] Cardiologist:  Kate Sable, MD  [place] Electrophysiologist:  None   Referring MD: [name], MD   Chief Complaint  Patient presents with  . Other    1 month follow up. Meds reviewed verbally with patient.     History of Present Illness:    Stephen Garrett is a 86 y.o. male with a hx of permanent atrial fibrillation, hypertension, hyperlipidemia, hard of hearing, TIA's presents for follow-up.   Deviously seen for lower extremity edema, hypertension and A. fib.  Amlodipine was stopped, losartan was started to help with edema.  Patient states edema is improved.  He also takes Lasix to help with swelling.  His edema appears to be dependent, usually gets worse as the day progresses with him being on his feet.  Has no other concerns at this time, denies palpitations, feels well.  Prior notes He was recently hospitalized due to fatigue and presyncope.  In the ED, EKG showed atrial fibrillation.  Work-up with echocardiogram 01/01/2020 showed normal ejection fraction, EF 55 to 60%, severe LA dilatation.  Cardioversion not attempted due to low probability of success with severely dilated left atrium.  Was started on Eliquis 2.5 mg twice daily.  His heart rate was low, as such beta-blocker was held on discharge.   Past Medical History:  Diagnosis Date  . Actinic keratosis   . BPH (benign prostatic hypertrophy)   . Diseases of lips   . Diverticulosis of colon 06/2003  . ED (erectile dysfunction)   . GERD (gastroesophageal reflux disease)   . Heart murmur    as child  . HLD (hyperlipidemia)   . HOH (hard of hearing)    Cochlear implant right, hearing aid left  . HTN (hypertension)   . Numbness and tingling in both hands    intermittent, trying braces   . Osteoarthritis of knee    severe, bilat  . Renal insufficiency   .  Restless legs syndrome (RLS)   . Sleep disturbance, unspecified   . Spinal stenosis, lumbar region, without neurogenic claudication     Past Surgical History:  Procedure Laterality Date  . CATARACT EXTRACTION, BILATERAL  2011   Dr. Thomasene Ripple  . COCHLEAR IMPLANT Right 02/15/2017   Procedure: RIGHT COCHLEAR IMPLANT;  Surgeon: Vicie Mutters, MD;  Location: [place];  Service: ENT;  Laterality: Right;  . FINGER SURGERY     Right 4th finger  . [facility]  10/2009   Dr. Bary Castilla  . LUMBAR SPINE SURGERY  06/2005   stenosis repair  . PTOSIS REPAIR Bilateral 04/19/2019   Procedure: BLEPHAROPTOSIS REPAIR; RESECT SKIN;  Surgeon: Karle Starch, MD;  Location: [place];  Service: Ophthalmology;  Laterality: Bilateral;  . TONSILLECTOMY  childhood  . TOTAL KNEE ARTHROPLASTY  06/2008   bilat (Dr. Marry Guan)    Current Medications: No outpatient medications have been marked as taking for the 02/14/20 encounter (Office Visit) with Kate Sable, MD.     Allergies:   Atorvastatin, Pravastatin sodium, and Statins   Social History   Socioeconomic History  . Marital status: Widowed    Spouse name: Not on file  . Number of children: 2  . Years of education: Not on file  . Highest education level: Not on file  Occupational History  . Occupation: Retired  Tobacco Use  .  Smoking status: Former Smoker    Packs/day: 0.75    Years: 30.00    Pack years: 22.50    Quit date: 02/28/1970    Years since quitting: 49.9  . Smokeless tobacco: Never Used  Vaping Use  . Vaping Use: Never used  Substance and Sexual Activity  . Alcohol use: Yes    Alcohol/week: 0.0 standard drinks    Comment: 1-2 glasses wine/daily  . Drug use: No  . Sexual activity: Not on file  Other Topics Concern  . Not on file  Social History Narrative   Widowed 2010   2 adopted children   Has living will   Daughter is now health care power of attorney   Would accept resuscitation but no prolonged ventilation    No tube feeds if cognitively unaware   Social Determinants of Health   Financial Resource Strain: Not on file  Food Insecurity: Not on file  Transportation Needs: Not on file  Physical Activity: Not on file  Stress: Not on file  Social Connections: Not on file     Family History: The patient's family history includes Hypertension in his mother; Other in his father.  ROS:   Please see the history of present illness.     All other systems reviewed and are negative.  EKGs/Labs/Other Studies Reviewed:    The following studies were reviewed today:   EKG:  EKG is  ordered today.  The ekg ordered today demonstrates atrial fibrillation, heart rate 70  Recent Labs: 01/01/2020: ALT 21; B Natriuretic Peptide 634.0; TSH 3.231 02/02/2020: Magnesium 2.1 02/03/2020: BUN 61; Creatinine, Ser 2.24; Hemoglobin 11.8; Platelets 182; Potassium 3.2; Sodium 136  Recent Lipid Panel    Component Value Date/Time   CHOL 199 01/01/2020 2252   TRIG 70 01/01/2020 2252   HDL 37 (L) 01/01/2020 2252   CHOLHDL 5.4 01/01/2020 2252   VLDL 14 01/01/2020 2252   LDLCALC 148 (H) 01/01/2020 2252   LDLDIRECT 170.1 11/02/2012 0928     Risk Assessment/Calculations:      Physical Exam:    VS:  BP 122/76 (BP Location: Left Arm, Patient Position: Sitting, Cuff Size: Normal)   Pulse 70   Ht 5\' 6"  (1.676 m)   Wt 170 lb (77.1 kg)   BMI 27.44 kg/m     Wt Readings from Last 3 Encounters:  02/14/20 170 lb (77.1 kg)  02/02/20 175 lb (79.4 kg)  01/29/20 185 lb (83.9 kg)     GEN: Well nourished, well developed in no acute distress, elderly gentleman HEENT: Normal, hard of hearing NECK: No JVD; No carotid bruits CARDIAC: Irregular irregular, 2/6 systolic murmur RESPIRATORY: Poor inspiratory effort ABDOMEN: Soft, non-tender, non-distended MUSCULOSKELETAL: 1-2+ edema; No deformity  SKIN: Warm and dry NEUROLOGIC: Alert and oriented x 3 PSYCHIATRIC: Normal affect   ASSESSMENT:    1. Permanent atrial  fibrillation ([place])   2. Primary hypertension   3. Leg edema   4. Pure hypercholesterolemia   5. Aortic valve stenosis, etiology of cardiac valve disease unspecified    PLAN:    In order of problems listed above:  1. Permanent atrial fibrillation, CHA2DS2-VASc of 5 (age, TIA, htn).  Heart rate controlled.  Echo 01/2020, EF 60 to 65%, severely dilated LA, severely dilated RA.  Continue Eliquis 2.5 mg twice daily. 2. Hypertension, BP controlled on losartan. 3. Lower extremity edema, improved with stopping amlodipine.  Leg raising advised. 4. Hyperlipidemia, low-cholesterol diet advised.  10-year ASCVD risk not applicable. 5. Mild aortic valve stenosis,  likely cause for systolic murmur on exam.  Monitor serially with echo.  Follow-up in 6 months.    Shared Decision Making/Informed Consent      Medication Adjustments/Labs and Tests Ordered: Current medicines are reviewed at length with the patient today.  Concerns regarding medicines are outlined above.  Orders Placed This Encounter  Procedures  . EKG 12-Lead   No orders of the defined types were placed in this encounter.   Patient Instructions  Medication Instructions:  Your physician recommends that you continue on your current medications as directed. Please refer to the Current Medication list given to you today.  *If you need a refill on your cardiac medications before your next appointment, please call your pharmacy*   Lab Work: None Ordered If you have labs (blood work) drawn today and your tests are completely normal, you will receive your results only by: Marland Kitchen MyChart Message (if you have MyChart) OR . A paper copy in the mail If you have any lab test that is abnormal or we need to change your treatment, we will call you to review the results.   Testing/Procedures: None Ordered   Follow-Up: At [facility], you and your health needs are our priority.  As part of our continuing mission to provide you with  exceptional heart care, we have created designated Provider Care Teams.  These Care Teams include your primary Cardiologist (physician) and Advanced Practice Providers (APPs -  Physician Assistants and Nurse Practitioners) who all work together to provide you with the care you need, when you need it.  We recommend signing up for the patient portal called "MyChart".  Sign up information is provided on this After Visit Summary.  MyChart is used to connect with patients for Virtual Visits (Telemedicine).  Patients are able to view lab/test results, encounter notes, upcoming appointments, etc.  Non-urgent messages can be sent to your provider as well.   To learn more about what you can do with MyChart, go to [email or web address].    Your next appointment:   6 month(s)  The format for your next appointment:   In Person  Provider:   Kate Sable, MD   Other Instructions       Signed, Kate Sable, MD  02/14/2020 4:36 PM    [place]

## 2020-02-14 NOTE — Patient Instructions (Signed)

## 2020-02-19 NOTE — Progress Notes (Signed)
[place]  Physical Therapy Certification  Patient Details  Name: Stephen Garrett MRN: [id number] Date of Birth: [birth year] Medical Diagnosis: Active Problems:   BPH with obstruction/lower urinary tract symptoms   Restless legs syndrome   Hypokalemia   A-fib ([place])   AKI (acute kidney injury) ([place])   Diarrhea  Visit Diagnosis: PT Visit Diagnosis: Difficulty in walking, not elsewhere classified (R26.2),Muscle weakness (generalized) (M62.81) PT Problem: Decreased strength,Decreased range of motion,Decreased balance,Decreased activity tolerance,Decreased knowledge of use of DME,Decreased safety awareness,Decreased mobility  Goals: Pt will perform standing balance or pre-gait : 3- 5 min,with Supervision,with no UE support Pt will Ambulate: > 125 feet,with modified independence,with least restrictive assistive device  Duration: Services will be provided through the following date: 02/17/20  Frequency: Min 2X/week  Amount: one treatment session per day unless otherwise indicated.    Certification Start Date: 44/96/7591 Certification End Date: 02/17/20   PT Treatments/Interventions: DME instruction,Gait training,Stair training,Functional mobility training,Therapeutic activities,Therapeutic exercise,Balance training,Neuromuscular re-education,Patient/family education    Chelsi Warr H. Owens Shark, PT, DPT, NCS 02/19/20, 1:48 PM [phone]

## 2020-02-28 ENCOUNTER — Other Ambulatory Visit: Payer: Self-pay | Admitting: Internal Medicine

## 2020-03-02 NOTE — Telephone Encounter (Signed)
Last filled 10-22-19 #180  [facility]

## 2020-03-09 DIAGNOSIS — H34832 Tributary (branch) retinal vein occlusion, left eye, with macular edema: Secondary | ICD-10-CM | POA: Diagnosis not present

## 2020-03-10 DIAGNOSIS — M6281 Muscle weakness (generalized): Secondary | ICD-10-CM | POA: Diagnosis not present

## 2020-03-10 DIAGNOSIS — R262 Difficulty in walking, not elsewhere classified: Secondary | ICD-10-CM | POA: Diagnosis not present

## 2020-03-10 DIAGNOSIS — R278 Other lack of coordination: Secondary | ICD-10-CM | POA: Diagnosis not present

## 2020-03-10 DIAGNOSIS — Z741 Need for assistance with personal care: Secondary | ICD-10-CM | POA: Diagnosis not present

## 2020-03-10 DIAGNOSIS — G2581 Restless legs syndrome: Secondary | ICD-10-CM | POA: Diagnosis not present

## 2020-03-10 DIAGNOSIS — R2681 Unsteadiness on feet: Secondary | ICD-10-CM | POA: Diagnosis not present

## 2020-03-10 DIAGNOSIS — I130 Hypertensive heart and chronic kidney disease with heart failure and stage 1 through stage 4 chronic kidney disease, or unspecified chronic kidney disease: Secondary | ICD-10-CM | POA: Diagnosis not present

## 2020-03-10 DIAGNOSIS — Z789 Other specified health status: Secondary | ICD-10-CM | POA: Diagnosis not present

## 2020-03-10 DIAGNOSIS — I10 Essential (primary) hypertension: Secondary | ICD-10-CM | POA: Diagnosis not present

## 2020-03-10 DIAGNOSIS — E876 Hypokalemia: Secondary | ICD-10-CM | POA: Diagnosis not present

## 2020-03-11 ENCOUNTER — Encounter: Payer: Self-pay | Admitting: Internal Medicine

## 2020-03-11 ENCOUNTER — Other Ambulatory Visit: Payer: Self-pay

## 2020-03-11 ENCOUNTER — Telehealth: Payer: Self-pay | Admitting: Internal Medicine

## 2020-03-11 ENCOUNTER — Telehealth: Payer: Self-pay

## 2020-03-11 ENCOUNTER — Ambulatory Visit (INDEPENDENT_AMBULATORY_CARE_PROVIDER_SITE_OTHER): Payer: Medicare Other | Admitting: Internal Medicine

## 2020-03-11 VITALS — BP 138/88 | HR 56 | Temp 97.5°F | Ht 66.0 in | Wt 188.0 lb

## 2020-03-11 DIAGNOSIS — Z741 Need for assistance with personal care: Secondary | ICD-10-CM | POA: Diagnosis not present

## 2020-03-11 DIAGNOSIS — I130 Hypertensive heart and chronic kidney disease with heart failure and stage 1 through stage 4 chronic kidney disease, or unspecified chronic kidney disease: Secondary | ICD-10-CM | POA: Diagnosis not present

## 2020-03-11 DIAGNOSIS — N184 Chronic kidney disease, stage 4 (severe): Secondary | ICD-10-CM

## 2020-03-11 DIAGNOSIS — R278 Other lack of coordination: Secondary | ICD-10-CM | POA: Diagnosis not present

## 2020-03-11 DIAGNOSIS — I10 Essential (primary) hypertension: Secondary | ICD-10-CM | POA: Diagnosis not present

## 2020-03-11 DIAGNOSIS — I4819 Other persistent atrial fibrillation: Secondary | ICD-10-CM | POA: Diagnosis not present

## 2020-03-11 DIAGNOSIS — I5033 Acute on chronic diastolic (congestive) heart failure: Secondary | ICD-10-CM

## 2020-03-11 DIAGNOSIS — Z789 Other specified health status: Secondary | ICD-10-CM | POA: Diagnosis not present

## 2020-03-11 DIAGNOSIS — E876 Hypokalemia: Secondary | ICD-10-CM | POA: Diagnosis not present

## 2020-03-11 LAB — RENAL FUNCTION PANEL
Albumin: 4 g/dL (ref 3.5–5.2)
BUN: 43 mg/dL — ABNORMAL HIGH (ref 6–23)
CO2: 30 mEq/L (ref 19–32)
Calcium: 9.8 mg/dL (ref 8.4–10.5)
Chloride: 101 mEq/L (ref 96–112)
Creatinine, Ser: 2.25 mg/dL — ABNORMAL HIGH (ref 0.40–1.50)
GFR: 24.34 mL/min — ABNORMAL LOW (ref >60.00)
Glucose, Bld: 96 mg/dL (ref 70–99)
Phosphorus: 3.3 mg/dL (ref 2.3–4.6)
Potassium: 4.5 mEq/L (ref 3.5–5.1)
Sodium: 139 mEq/L (ref 135–145)

## 2020-03-11 MED ORDER — FUROSEMIDE 80 MG PO TABS
80.0000 mg | ORAL_TABLET | Freq: Every day | ORAL | 11 refills | Status: DC
Start: 2020-03-11 — End: 2020-09-28

## 2020-03-11 MED ORDER — FUROSEMIDE 80 MG PO TABS
80.0000 mg | ORAL_TABLET | Freq: Every day | ORAL | 11 refills | Status: DC
Start: 2020-03-11 — End: 2020-03-11

## 2020-03-11 NOTE — Assessment & Plan Note (Signed)
May need to accept worsened renal function to manage the CHF Will recheck next time Is on losartan

## 2020-03-11 NOTE — Telephone Encounter (Signed)
I have not seen it.

## 2020-03-11 NOTE — Assessment & Plan Note (Signed)
He has gained 15-20# from his weight at [facility] Much of this appears to be dietary salt----aide will help him cut this out Increase furosemide to 80mg  daily No metalozone since this caused severe hypokalemia in the past May need to increase the furosemide to bid

## 2020-03-11 NOTE — Patient Instructions (Signed)
Start weighing every morning after emptying your bladder. Increase the furosemide to 80mg  daily----and if your weight in the morning is 175# or more, take a second dose after lunch.

## 2020-03-11 NOTE — Telephone Encounter (Signed)
Patient said he dropped off a form for his insurance for a reimbursement last week.  Did you receive the form? It's not up front in the folders and Raquel Sarna said she doesn't have the form.

## 2020-03-11 NOTE — Telephone Encounter (Signed)
Rx sent electronically.  

## 2020-03-11 NOTE — Progress Notes (Signed)
Subjective:    Patient ID: Stephen Garrett, male    DOB: 09-06-1923, 87 y.o.   MRN: [id number]  HPI Here for follow up after rehab admission With Sheena This visit occurred during the SARS-CoV-2 public health emergency.  Safety protocols were in place, including screening questions prior to the visit, additional usage of staff PPE, and extensive cleaning of exam room while observing appropriate contact time as indicated for disinfecting solutions.   Had been admitted to hospital with multifactorial weakness Significant hypokalemia related to [facility] was stopped Had been decreased to 40mg  furosemide in hospital due to renal insufficiency (CKD 4)---but I increased it back at [facility] rehab Then decreased again due to creatinine up to ~2.5 Last creatinine 1.88---GFR 30  Has aides 4 hours per day --7 days a week Help with showers and dressing (he mostly does this) Aides doing all shopping, food prep, instrumental ADLs  He feels he is doing well Has gained 10# or more since going home Discussed cutting out salt  Some SOB---breathes heavy Noisy distressed breathing per Sheena at times DOE just walking around house Sleeps in bed mostly--flat. Awakens--but not PND  Current Outpatient Medications on File Prior to Visit  Medication Sig Dispense Refill  . apixaban (ELIQUIS) 2.5 MG TABS tablet Take 1 tablet (2.5 mg total) by mouth 2 (two) times daily. 60 tablet 1  . buPROPion (WELLBUTRIN XL) 150 MG 24 hr tablet TAKE 1 TABLET BY MOUTH DAILY 90 tablet 3  . clonazePAM (KLONOPIN) 0.5 MG tablet TAKE ONE TO TWO TABLETS AT BEDTIME 180 tablet 0  . finasteride (PROSCAR) 5 MG tablet TAKE ONE TABLET BY MOUTH EVERY DAY (Patient taking differently: Take 5 mg by mouth daily.) 90 tablet 3  . furosemide (LASIX) 80 MG tablet Take 0.5 tablets (40 mg total) by mouth daily. 30 tablet 11  . losartan (COZAAR) 50 MG tablet Take 1 tablet (50 mg total) by mouth daily. 90 tablet 3  . metolazone  (ZAROXOLYN) 2.5 MG tablet Take 1 tablet (2.5 mg total) by mouth daily as needed. (Patient taking differently: Take 2.5 mg by mouth daily as needed (excessive fluid retention).) 30 tablet 3  . Multiple Vitamin (MULTIVITAMIN) capsule Take 1 capsule by mouth daily.    Marland Kitchen omeprazole (PRILOSEC) 40 MG capsule TAKE 1 CAPSULE BY MOUTH ONCE DAILY (Patient taking differently: Take 40 mg by mouth daily.) 90 capsule 3  . polyethylene glycol (MIRALAX / GLYCOLAX) 17 g packet Take 17 g by mouth daily as needed. 14 each 0  . potassium chloride (KLOR-CON) 10 MEQ tablet TAKE ONE TABLET EVERY MORNING 90 tablet 3  . pramipexole (MIRAPEX) 1 MG tablet TAKE ONE TABLET AT DINNER AND ONE TABLETAT BEDTIME (Patient taking differently: Take 1 mg by mouth See admin instructions. Take 1 tablet (1mg ) by mouth at suppertime and take 1 tablet (1mg ) by mouth at bedtime) 180 tablet 3  . pyridoxine (B-6) 100 MG tablet Take 100 mg by mouth daily.    . tamsulosin (FLOMAX) 0.4 MG CAPS capsule TAKE 1 CAPSULE EVERY DAY (Patient taking differently: Take 0.4 mg by mouth daily.) 90 capsule 3  . [DISCONTINUED] citalopram (CELEXA) 10 MG tablet Take 1 tablet (10 mg total) by mouth daily. 30 tablet 5   No current facility-administered medications on file prior to visit.    Allergies  Allergen Reactions  . Atorvastatin     REACTION: raises blood pressure  . Pravastatin Sodium     REACTION: raises blood pressure  . Statins  REACTION: raises bp    Past Medical History:  Diagnosis Date  . Actinic keratosis   . BPH (benign prostatic hypertrophy)   . Diseases of lips   . Diverticulosis of colon 06/2003  . ED (erectile dysfunction)   . GERD (gastroesophageal reflux disease)   . Heart murmur    as child  . HLD (hyperlipidemia)   . HOH (hard of hearing)    Cochlear implant right, hearing aid left  . HTN (hypertension)   . Numbness and tingling in both hands    intermittent, trying braces   . Osteoarthritis of knee    severe, bilat   . Renal insufficiency   . Restless legs syndrome (RLS)   . Sleep disturbance, unspecified   . Spinal stenosis, lumbar region, without neurogenic claudication     Past Surgical History:  Procedure Laterality Date  . CATARACT EXTRACTION, BILATERAL  2011   Dr. Thomasene Ripple  . COCHLEAR IMPLANT Right 02/15/2017   Procedure: RIGHT COCHLEAR IMPLANT;  Surgeon: Vicie Mutters, MD;  Location: [place];  Service: ENT;  Laterality: Right;  . FINGER SURGERY     Right 4th finger  . [facility]  10/2009   Dr. Bary Castilla  . LUMBAR SPINE SURGERY  06/2005   stenosis repair  . PTOSIS REPAIR Bilateral 04/19/2019   Procedure: BLEPHAROPTOSIS REPAIR; RESECT SKIN;  Surgeon: Karle Starch, MD;  Location: [place];  Service: Ophthalmology;  Laterality: Bilateral;  . TONSILLECTOMY  childhood  . TOTAL KNEE ARTHROPLASTY  06/2008   bilat (Dr. Marry Guan)    Family History  Problem Relation Age of Onset  . Other Father        "clogged arteries"  . Hypertension Mother     Social History   Socioeconomic History  . Marital status: Widowed    Spouse name: Not on file  . Number of children: 2  . Years of education: Not on file  . Highest education level: Not on file  Occupational History  . Occupation: Retired  Tobacco Use  . Smoking status: Former Smoker    Packs/day: 0.75    Years: 30.00    Pack years: 22.50    Quit date: 02/28/1970    Years since quitting: 50.0  . Smokeless tobacco: Never Used  Vaping Use  . Vaping Use: Never used  Substance and Sexual Activity  . Alcohol use: Yes    Alcohol/week: 0.0 standard drinks    Comment: 1-2 glasses wine/daily  . Drug use: No  . Sexual activity: Not on file  Other Topics Concern  . Not on file  Social History Narrative   Widowed 2010   2 adopted children   Has living will   Daughter is now health care power of attorney   Would accept resuscitation but no prolonged ventilation   No tube feeds if cognitively unaware   Social Determinants  of Health   Financial Resource Strain: Not on file  Food Insecurity: Not on file  Transportation Needs: Not on file  Physical Activity: Not on file  Stress: Not on file  Social Connections: Not on file  Intimate Partner Violence: Not on file   Review of Systems Restless at night--so not sleeping great Appetite is good No chest pain    Objective:   Physical Exam Constitutional:      Appearance: Normal appearance.  Cardiovascular:     Rate and Rhythm: Normal rate. Rhythm irregular.     Heart sounds: No murmur heard. No gallop.   Pulmonary:  Effort: Pulmonary effort is normal.     Breath sounds: Normal breath sounds. No wheezing or rales.  Musculoskeletal:     Comments: Tense calves without pitting  Neurological:     Mental Status: He is alert.  Psychiatric:        Mood and Affect: Mood normal.            Assessment & Plan:

## 2020-03-11 NOTE — Telephone Encounter (Signed)
Pt said pharmacy didn't receive his prescription for  furosemide (LASIX) 80 MG tablet. He would like it sent to [facility] pharmacy today. It doesn't look like it went to pharmacy when sent over will you please resend his medication? Thanks.

## 2020-03-11 NOTE — Assessment & Plan Note (Signed)
Rate is fine Is on the eliquis

## 2020-03-13 DIAGNOSIS — R278 Other lack of coordination: Secondary | ICD-10-CM | POA: Diagnosis not present

## 2020-03-13 DIAGNOSIS — Z741 Need for assistance with personal care: Secondary | ICD-10-CM | POA: Diagnosis not present

## 2020-03-13 DIAGNOSIS — E876 Hypokalemia: Secondary | ICD-10-CM | POA: Diagnosis not present

## 2020-03-13 DIAGNOSIS — Z789 Other specified health status: Secondary | ICD-10-CM | POA: Diagnosis not present

## 2020-03-13 DIAGNOSIS — I10 Essential (primary) hypertension: Secondary | ICD-10-CM | POA: Diagnosis not present

## 2020-03-13 DIAGNOSIS — I130 Hypertensive heart and chronic kidney disease with heart failure and stage 1 through stage 4 chronic kidney disease, or unspecified chronic kidney disease: Secondary | ICD-10-CM | POA: Diagnosis not present

## 2020-03-17 DIAGNOSIS — Z741 Need for assistance with personal care: Secondary | ICD-10-CM | POA: Diagnosis not present

## 2020-03-17 DIAGNOSIS — R278 Other lack of coordination: Secondary | ICD-10-CM | POA: Diagnosis not present

## 2020-03-17 DIAGNOSIS — I10 Essential (primary) hypertension: Secondary | ICD-10-CM | POA: Diagnosis not present

## 2020-03-17 DIAGNOSIS — Z789 Other specified health status: Secondary | ICD-10-CM | POA: Diagnosis not present

## 2020-03-17 DIAGNOSIS — I130 Hypertensive heart and chronic kidney disease with heart failure and stage 1 through stage 4 chronic kidney disease, or unspecified chronic kidney disease: Secondary | ICD-10-CM | POA: Diagnosis not present

## 2020-03-17 DIAGNOSIS — E876 Hypokalemia: Secondary | ICD-10-CM | POA: Diagnosis not present

## 2020-03-19 DIAGNOSIS — I10 Essential (primary) hypertension: Secondary | ICD-10-CM | POA: Diagnosis not present

## 2020-03-19 DIAGNOSIS — Z789 Other specified health status: Secondary | ICD-10-CM | POA: Diagnosis not present

## 2020-03-19 DIAGNOSIS — I130 Hypertensive heart and chronic kidney disease with heart failure and stage 1 through stage 4 chronic kidney disease, or unspecified chronic kidney disease: Secondary | ICD-10-CM | POA: Diagnosis not present

## 2020-03-19 DIAGNOSIS — E876 Hypokalemia: Secondary | ICD-10-CM | POA: Diagnosis not present

## 2020-03-19 DIAGNOSIS — R278 Other lack of coordination: Secondary | ICD-10-CM | POA: Diagnosis not present

## 2020-03-19 DIAGNOSIS — Z741 Need for assistance with personal care: Secondary | ICD-10-CM | POA: Diagnosis not present

## 2020-03-20 ENCOUNTER — Emergency Department
Admission: EM | Admit: 2020-03-20 | Discharge: 2020-03-20 | Disposition: A | Discharge status: Home / Self Care | Payer: Medicare Other | Attending: Emergency Medicine | Admitting: Emergency Medicine

## 2020-03-20 ENCOUNTER — Other Ambulatory Visit: Payer: Self-pay

## 2020-03-20 ENCOUNTER — Ambulatory Visit: Payer: Medicare Other | Admitting: Internal Medicine

## 2020-03-20 DIAGNOSIS — R680 Hypothermia, not associated with low environmental temperature: Secondary | ICD-10-CM | POA: Insufficient documentation

## 2020-03-20 DIAGNOSIS — N184 Chronic kidney disease, stage 4 (severe): Secondary | ICD-10-CM | POA: Diagnosis not present

## 2020-03-20 DIAGNOSIS — T68XXXA Hypothermia, initial encounter: Secondary | ICD-10-CM

## 2020-03-20 DIAGNOSIS — I5033 Acute on chronic diastolic (congestive) heart failure: Secondary | ICD-10-CM | POA: Insufficient documentation

## 2020-03-20 DIAGNOSIS — I130 Hypertensive heart and chronic kidney disease with heart failure and stage 1 through stage 4 chronic kidney disease, or unspecified chronic kidney disease: Secondary | ICD-10-CM | POA: Diagnosis not present

## 2020-03-20 DIAGNOSIS — Z87891 Personal history of nicotine dependence: Secondary | ICD-10-CM | POA: Diagnosis not present

## 2020-03-20 DIAGNOSIS — Z79899 Other long term (current) drug therapy: Secondary | ICD-10-CM | POA: Insufficient documentation

## 2020-03-20 DIAGNOSIS — Z96653 Presence of artificial knee joint, bilateral: Secondary | ICD-10-CM | POA: Diagnosis not present

## 2020-03-20 DIAGNOSIS — I4891 Unspecified atrial fibrillation: Secondary | ICD-10-CM | POA: Diagnosis not present

## 2020-03-20 DIAGNOSIS — R001 Bradycardia, unspecified: Secondary | ICD-10-CM | POA: Diagnosis not present

## 2020-03-20 DIAGNOSIS — Z7901 Long term (current) use of anticoagulants: Secondary | ICD-10-CM | POA: Insufficient documentation

## 2020-03-20 DIAGNOSIS — R0902 Hypoxemia: Secondary | ICD-10-CM | POA: Diagnosis not present

## 2020-03-20 DIAGNOSIS — R000 Tachycardia, unspecified: Secondary | ICD-10-CM | POA: Diagnosis not present

## 2020-03-20 DIAGNOSIS — I10 Essential (primary) hypertension: Secondary | ICD-10-CM | POA: Diagnosis not present

## 2020-03-20 LAB — CBC WITH DIFFERENTIAL/PLATELET
Abs Immature Granulocytes: 0.03 10*3/uL (ref 0.00–0.07)
Basophils Absolute: 0 10*3/uL (ref 0.0–0.1)
Basophils Relative: 0 %
Eosinophils Absolute: 0 10*3/uL (ref 0.0–0.5)
Eosinophils Relative: 0 %
HCT: 39.3 % (ref 39.0–52.0)
Hemoglobin: 12.1 g/dL — ABNORMAL LOW (ref 13.0–17.0)
Immature Granulocytes: 0 %
Lymphocytes Relative: 7 %
Lymphs Abs: 0.7 10*3/uL (ref 0.7–4.0)
MCH: 29.2 pg (ref 26.0–34.0)
MCHC: 30.8 g/dL (ref 30.0–36.0)
MCV: 94.9 fL (ref 80.0–100.0)
Monocytes Absolute: 0.7 10*3/uL (ref 0.1–1.0)
Monocytes Relative: 7 %
Neutro Abs: 8.7 10*3/uL — ABNORMAL HIGH (ref 1.7–7.7)
Neutrophils Relative %: 86 %
Platelets: 159 10*3/uL (ref 150–400)
RBC: 4.14 MIL/uL — ABNORMAL LOW (ref 4.22–5.81)
RDW: 15.8 % — ABNORMAL HIGH (ref 11.5–15.5)
WBC: 10.1 10*3/uL (ref 4.0–10.5)
nRBC: 0 % (ref 0.0–0.2)

## 2020-03-20 LAB — COMPREHENSIVE METABOLIC PANEL
ALT: 49 U/L — ABNORMAL HIGH (ref 0–44)
AST: 104 U/L — ABNORMAL HIGH (ref 15–41)
Albumin: 3.8 g/dL (ref 3.5–5.0)
Alkaline Phosphatase: 86 U/L (ref 38–126)
Anion gap: 21 — ABNORMAL HIGH (ref 5–15)
BUN: 52 mg/dL — ABNORMAL HIGH (ref 8–23)
CO2: 21 mmol/L — ABNORMAL LOW (ref 22–32)
Calcium: 9.3 mg/dL (ref 8.9–10.3)
Chloride: 100 mmol/L (ref 98–111)
Creatinine, Ser: 2.41 mg/dL — ABNORMAL HIGH (ref 0.61–1.24)
GFR, Estimated: 24 mL/min — ABNORMAL LOW (ref >60)
Glucose, Bld: 88 mg/dL (ref 70–99)
Potassium: 3.9 mmol/L (ref 3.5–5.1)
Sodium: 142 mmol/L (ref 135–145)
Total Bilirubin: 2.6 mg/dL — ABNORMAL HIGH (ref 0.3–1.2)
Total Protein: 6.7 g/dL (ref 6.5–8.1)

## 2020-03-20 LAB — TROPONIN I (HIGH SENSITIVITY)
Troponin I (High Sensitivity): 318 ng/L (ref <18)
Troponin I (High Sensitivity): 396 ng/L (ref <18)

## 2020-03-20 LAB — CK: Total CK: 836 U/L — ABNORMAL HIGH (ref 49–397)

## 2020-03-20 NOTE — ED Triage Notes (Signed)
Pt presents to the [facility] via EMS from [facility] with c/o cold exposure. EMS states that pt was found in bath tub by maintenance staff. Pt states that he fell asleep while in the tub last night around midnight. Pt does not have any complaints at this time. Pt currently A&Ox4 and VSS

## 2020-03-20 NOTE — ED Provider Notes (Signed)
[facility] Emergency Department Provider Note   ____________________________________________   Event Date/Time   First MD Initiated Contact with Patient 03/20/20 1148     (approximate)  I have reviewed the triage vital signs and the nursing notes.   HISTORY  Chief Complaint Cold Exposure    HPI Stephen Garrett is a 87 y.o. male with a past medical history of hyperlipidemia, GERD, hypertension, and diverticulosis who presents via EMS from home after he was found in his bathtub by maintenance staff.  Patient states that he was in the tub last night when he fell asleep around midnight until the maintenance workers work him up today.  Patient denies any complaints at this time.  EMS found patient to be hypothermic at 53F and with an oxygen saturation in the 70s.  Patient currently denies any vision changes, tinnitus, difficulty speaking, facial droop, sore throat, chest pain, shortness of breath, abdominal pain, nausea/vomiting/diarrhea, dysuria, or weakness/numbness/paresthesias in any extremity         Past Medical History:  Diagnosis Date  . Actinic keratosis   . BPH (benign prostatic hypertrophy)   . Diseases of lips   . Diverticulosis of colon 06/2003  . ED (erectile dysfunction)   . GERD (gastroesophageal reflux disease)   . Heart murmur    as child  . HLD (hyperlipidemia)   . HOH (hard of hearing)    Cochlear implant right, hearing aid left  . HTN (hypertension)   . Numbness and tingling in both hands    intermittent, trying braces   . Osteoarthritis of knee    severe, bilat  . Renal insufficiency   . Restless legs syndrome (RLS)   . Sleep disturbance, unspecified   . Spinal stenosis, lumbar region, without neurogenic claudication     Patient Active Problem List   Diagnosis Date Noted  . Acute on chronic diastolic heart failure ([place]) 03/11/2020  . AKI (acute kidney injury) ([place]) 02/02/2020  . Diarrhea 02/02/2020  . Chronic diastolic  heart failure ([place]) 01/14/2020  . Acute renal failure superimposed on stage 3 chronic kidney disease ([place]) 01/01/2020  . Hypokalemia 01/01/2020  . NSTEMI (non-ST elevated myocardial infarction) ([place]) 01/01/2020  . A-fib ([place]) 01/01/2020  . Diplopia 09/25/2017  . TIA (transient ischemic attack) 09/14/2017  . Retinal vein occlusion, central 02/19/2014  . Chronic venous insufficiency 02/19/2014  . Episodic mood disorder ([place]) 12/20/2013  . Advanced directives, counseling/discussion 12/20/2013  . Routine general medical examination at a health care facility 11/02/2012  . Chronic kidney disease, stage IV (severe) ([place]) 01/26/2009  . Restless legs syndrome 10/03/2008  . OSTEOARTHRITIS 12/12/2006  . SPINAL STENOSIS, LUMBAR 06/24/2006  . Essential hypertension, benign 06/21/2006  . GERD 06/21/2006  . DIVERTICULOSIS, COLON 06/21/2006  . BPH with obstruction/lower urinary tract symptoms 06/21/2006    Past Surgical History:  Procedure Laterality Date  . CATARACT EXTRACTION, BILATERAL  2011   Dr. Thomasene Ripple  . COCHLEAR IMPLANT Right 02/15/2017   Procedure: RIGHT COCHLEAR IMPLANT;  Surgeon: Vicie Mutters, MD;  Location: [place];  Service: ENT;  Laterality: Right;  . FINGER SURGERY     Right 4th finger  . [facility]  10/2009   Dr. Bary Castilla  . LUMBAR SPINE SURGERY  06/2005   stenosis repair  . PTOSIS REPAIR Bilateral 04/19/2019   Procedure: BLEPHAROPTOSIS REPAIR; RESECT SKIN;  Surgeon: Karle Starch, MD;  Location: [place];  Service: Ophthalmology;  Laterality: Bilateral;  . TONSILLECTOMY  childhood  . TOTAL KNEE  ARTHROPLASTY  06/2008   bilat (Dr. Marry Guan)    Prior to Admission medications   Medication Sig Start Date End Date Taking? Authorizing Provider  apixaban (ELIQUIS) 2.5 MG TABS tablet Take 1 tablet (2.5 mg total) by mouth 2 (two) times daily. 01/05/20  Yes Dessa Phi, DO  buPROPion (WELLBUTRIN XL) 150 MG 24 hr tablet TAKE 1 TABLET BY MOUTH DAILY 03/02/20  Yes  [name], MD  clonazePAM (KLONOPIN) 0.5 MG tablet TAKE ONE TO TWO TABLETS AT BEDTIME Patient taking differently: Take 0.25 mg by mouth at bedtime. 03/02/20  Yes [name], MD  finasteride (PROSCAR) 5 MG tablet TAKE ONE TABLET BY MOUTH EVERY DAY Patient taking differently: Take 5 mg by mouth daily. 08/05/19  Yes [name], MD  furosemide (LASIX) 80 MG tablet Take 1 tablet (80 mg total) by mouth daily. If home weight is over 175#--take a second dose after lunch 03/11/20  Yes [name], MD  losartan (COZAAR) 50 MG tablet Take 1 tablet (50 mg total) by mouth daily. 01/06/20 04/05/20 Yes Kate Sable, MD  Multiple Vitamin (MULTIVITAMIN) capsule Take 1 capsule by mouth daily.   Yes [provider]  omeprazole (PRILOSEC) 40 MG capsule TAKE 1 CAPSULE BY MOUTH ONCE DAILY Patient taking differently: Take 40 mg by mouth daily. 04/25/19  Yes [name], MD  polyethylene glycol (MIRALAX / GLYCOLAX) 17 g packet Take 17 g by mouth daily as needed. 02/03/20  Yes Sharen Hones, MD  potassium chloride (KLOR-CON) 10 MEQ tablet TAKE ONE TABLET EVERY MORNING 03/02/20  Yes [name], MD  pramipexole (MIRAPEX) 1 MG tablet TAKE ONE TABLET AT DINNER AND ONE TABLETAT BEDTIME Patient taking differently: Take 1 mg by mouth See admin instructions. Take 1 tablet (1mg ) by mouth at suppertime and take 1 tablet (1mg ) by mouth at bedtime 09/16/19  Yes [name], MD  pyridoxine (B-6) 100 MG tablet Take 100 mg by mouth daily.   Yes [provider]  tamsulosin (FLOMAX) 0.4 MG CAPS capsule TAKE 1 CAPSULE EVERY DAY Patient taking differently: Take 0.4 mg by mouth daily. 08/05/19  Yes [name], MD  citalopram (CELEXA) 10 MG tablet Take 1 tablet (10 mg total) by mouth daily. 04/22/11 05/18/11  [name], MD    Allergies Atorvastatin, Pravastatin sodium, and Statins  Family History  Problem Relation Age of Onset  . Other Father        "clogged arteries"   . Hypertension Mother     Social History Social History   Tobacco Use  . Smoking status: Former Smoker    Packs/day: 0.75    Years: 30.00    Pack years: 22.50    Quit date: 02/28/1970    Years since quitting: 50.0  . Smokeless tobacco: Never Used  Vaping Use  . Vaping Use: Never used  Substance Use Topics  . Alcohol use: Yes    Alcohol/week: 0.0 standard drinks    Comment: 1-2 glasses wine/daily  . Drug use: No    Review of Systems Constitutional: No fever/chills Eyes: No visual changes. ENT: No sore throat. Cardiovascular: Denies chest pain. Respiratory: Denies shortness of breath. Gastrointestinal: No abdominal pain.  No nausea, no vomiting.  No diarrhea. Genitourinary: Negative for dysuria. Musculoskeletal: Negative for acute arthralgias Skin: Negative for rash. Neurological: Negative for headaches, weakness/numbness/paresthesias in any extremity Psychiatric: Negative for suicidal ideation/homicidal ideation   ____________________________________________   PHYSICAL EXAM:  VITAL SIGNS: ED Triage Vitals  Enc Vitals  Group     BP 03/20/20 1148 (!) 156/93     Pulse Rate 03/20/20 1148 63     Resp 03/20/20 1148 16     Temp --      Temp src --      SpO2 03/20/20 1148 100 %     Weight 03/20/20 1149 175 lb (79.4 kg)     Height 03/20/20 1149 5\' 7"  (1.702 m)     Head Circumference --      Peak Flow --      Pain Score 03/20/20 1149 0     Pain Loc --      Pain Edu? --      Excl. in [place]? --    Constitutional: Alert and oriented. Well appearing and in no acute distress. Eyes: Conjunctivae are normal. PERRL. Head: Atraumatic. Nose: No congestion/rhinnorhea. Mouth/Throat: Mucous membranes are moist. Neck: No stridor Cardiovascular: Grossly normal heart sounds.  Good peripheral circulation. Respiratory: Normal respiratory effort.  No retractions.  Clear to auscultation bilaterally.  2 L nasal cannula in place Gastrointestinal: Soft and nontender. No  distention. Musculoskeletal: No obvious deformities Neurologic:  Normal speech and language. No gross focal neurologic deficits are appreciated. Skin:  Skin is warm and dry. No rash noted. Psychiatric: Mood and affect are normal. Speech and behavior are normal.  ____________________________________________   LABS (all labs ordered are listed, but only abnormal results are displayed)  Labs Reviewed  COMPREHENSIVE METABOLIC PANEL - Abnormal; Notable for the following components:      Result Value   CO2 21 (*)    BUN 52 (*)    Creatinine, Ser 2.41 (*)    AST 104 (*)    ALT 49 (*)    Total Bilirubin 2.6 (*)    GFR, Estimated 24 (*)    Anion gap 21 (*)    All other components within normal limits  CBC WITH DIFFERENTIAL/PLATELET - Abnormal; Notable for the following components:   RBC 4.14 (*)    Hemoglobin 12.1 (*)    RDW 15.8 (*)    Neutro Abs 8.7 (*)    All other components within normal limits  CK - Abnormal; Notable for the following components:   Total CK 836 (*)    All other components within normal limits  TROPONIN I (HIGH SENSITIVITY) - Abnormal; Notable for the following components:   Troponin I (High Sensitivity) 318 (*)    All other components within normal limits  TROPONIN I (HIGH SENSITIVITY) - Abnormal; Notable for the following components:   Troponin I (High Sensitivity) 396 (*)    All other components within normal limits  URINALYSIS, COMPLETE (UACMP) WITH MICROSCOPIC   ____________________________________________  EKG  ED ECG REPORT I, Naaman Plummer, the attending physician, personally viewed and interpreted this ECG.  Date: 03/20/2020 EKG Time: 1206 Rate: 72 Rhythm: Atrial fibrillation QRS Axis: normal Intervals: normal ST/T Wave abnormalities: normal Narrative Interpretation: no evidence of acute ischemia   PROCEDURES  Procedure(s) performed (including Critical Care):  .1-3 Lead EKG Interpretation Performed by: Naaman Plummer,  MD Authorized by: Naaman Plummer, MD     Interpretation: normal     ECG rate:  80   ECG rate assessment: normal     Rhythm: atrial fibrillation     Ectopy: none     Conduction: normal       ____________________________________________   INITIAL IMPRESSION / ASSESSMENT AND PLAN / ED COURSE  As part of my medical decision making, I reviewed the  following data within the [place] notes reviewed and incorporated, Labs reviewed, EKG interpreted, Old chart reviewed, and Notes from prior ED visits reviewed and incorporated        Patient is a 87 year old male with a past medical history of of CHF and atrial fibrillation who presents via EMS after passing out in his bathtub last night around midnight.  Differential diagnosis for this patient includes but is not limited to syncope, malignant arrhythmia, ACS, CVA, sepsis  Laboratory evaluation only significant for an elevated troponin at upon chart review, patient's troponin has persistently stayed at this level and has been evaluated by cardiology as being demand ischemia from his persistent atrial fibrillation as well as heart failure.  Patient currently denies any chest pain or significant shortness of breath.  The patient has been reexamined and is ready to be discharged.  All diagnostic results have been reviewed and discussed with the patient/family.  Care plan has been outlined and the patient/family understands all current diagnoses, results, and treatment plans.  There are no new complaints, changes, or physical findings at this time.  All questions have been addressed and answered. Patient was instructed to, and agrees to follow-up with their primary care physician as well as return to the emergency department if any new or worsening symptoms develop.      ____________________________________________   FINAL CLINICAL IMPRESSION(S) / ED DIAGNOSES  Final diagnoses:  Hypothermia, initial encounter      ED Discharge Orders    None       Note:  This document was prepared using Dragon voice recognition software and may include unintentional dictation errors.   Naaman Plummer, MD 03/20/20 [phone]

## 2020-03-24 DIAGNOSIS — Z789 Other specified health status: Secondary | ICD-10-CM | POA: Diagnosis not present

## 2020-03-24 DIAGNOSIS — I130 Hypertensive heart and chronic kidney disease with heart failure and stage 1 through stage 4 chronic kidney disease, or unspecified chronic kidney disease: Secondary | ICD-10-CM | POA: Diagnosis not present

## 2020-03-24 DIAGNOSIS — E876 Hypokalemia: Secondary | ICD-10-CM | POA: Diagnosis not present

## 2020-03-24 DIAGNOSIS — R278 Other lack of coordination: Secondary | ICD-10-CM | POA: Diagnosis not present

## 2020-03-24 DIAGNOSIS — I10 Essential (primary) hypertension: Secondary | ICD-10-CM | POA: Diagnosis not present

## 2020-03-24 DIAGNOSIS — Z741 Need for assistance with personal care: Secondary | ICD-10-CM | POA: Diagnosis not present

## 2020-03-26 DIAGNOSIS — I4819 Other persistent atrial fibrillation: Secondary | ICD-10-CM | POA: Diagnosis not present

## 2020-03-26 DIAGNOSIS — N184 Chronic kidney disease, stage 4 (severe): Secondary | ICD-10-CM | POA: Diagnosis not present

## 2020-03-26 DIAGNOSIS — F0150 Vascular dementia without behavioral disturbance: Secondary | ICD-10-CM | POA: Diagnosis not present

## 2020-03-26 DIAGNOSIS — N400 Enlarged prostate without lower urinary tract symptoms: Secondary | ICD-10-CM

## 2020-03-26 DIAGNOSIS — F39 Unspecified mood [affective] disorder: Secondary | ICD-10-CM

## 2020-03-26 DIAGNOSIS — I5032 Chronic diastolic (congestive) heart failure: Secondary | ICD-10-CM | POA: Diagnosis not present

## 2020-03-27 ENCOUNTER — Ambulatory Visit: Payer: Medicare Other | Admitting: Internal Medicine

## 2020-03-27 DIAGNOSIS — I130 Hypertensive heart and chronic kidney disease with heart failure and stage 1 through stage 4 chronic kidney disease, or unspecified chronic kidney disease: Secondary | ICD-10-CM | POA: Diagnosis not present

## 2020-03-27 DIAGNOSIS — I10 Essential (primary) hypertension: Secondary | ICD-10-CM | POA: Diagnosis not present

## 2020-03-27 DIAGNOSIS — Z789 Other specified health status: Secondary | ICD-10-CM | POA: Diagnosis not present

## 2020-03-27 DIAGNOSIS — E876 Hypokalemia: Secondary | ICD-10-CM | POA: Diagnosis not present

## 2020-03-27 DIAGNOSIS — Z741 Need for assistance with personal care: Secondary | ICD-10-CM | POA: Diagnosis not present

## 2020-03-27 DIAGNOSIS — R278 Other lack of coordination: Secondary | ICD-10-CM | POA: Diagnosis not present

## 2020-03-30 ENCOUNTER — Other Ambulatory Visit: Payer: Self-pay | Admitting: Internal Medicine

## 2020-03-30 DIAGNOSIS — I10 Essential (primary) hypertension: Secondary | ICD-10-CM | POA: Diagnosis not present

## 2020-03-30 DIAGNOSIS — Z789 Other specified health status: Secondary | ICD-10-CM | POA: Diagnosis not present

## 2020-03-30 DIAGNOSIS — I130 Hypertensive heart and chronic kidney disease with heart failure and stage 1 through stage 4 chronic kidney disease, or unspecified chronic kidney disease: Secondary | ICD-10-CM | POA: Diagnosis not present

## 2020-03-30 DIAGNOSIS — I5032 Chronic diastolic (congestive) heart failure: Secondary | ICD-10-CM | POA: Diagnosis not present

## 2020-03-30 DIAGNOSIS — E876 Hypokalemia: Secondary | ICD-10-CM | POA: Diagnosis not present

## 2020-03-30 DIAGNOSIS — R278 Other lack of coordination: Secondary | ICD-10-CM | POA: Diagnosis not present

## 2020-03-30 DIAGNOSIS — Z741 Need for assistance with personal care: Secondary | ICD-10-CM | POA: Diagnosis not present

## 2020-03-30 NOTE — Telephone Encounter (Signed)
He is now in [place] and will be getting his prescriptions through [facility] there

## 2020-03-31 DIAGNOSIS — G2581 Restless legs syndrome: Secondary | ICD-10-CM | POA: Diagnosis not present

## 2020-03-31 DIAGNOSIS — R262 Difficulty in walking, not elsewhere classified: Secondary | ICD-10-CM | POA: Diagnosis not present

## 2020-03-31 DIAGNOSIS — I4821 Permanent atrial fibrillation: Secondary | ICD-10-CM | POA: Diagnosis not present

## 2020-03-31 DIAGNOSIS — N1832 Chronic kidney disease, stage 3b: Secondary | ICD-10-CM | POA: Diagnosis not present

## 2020-03-31 DIAGNOSIS — I130 Hypertensive heart and chronic kidney disease with heart failure and stage 1 through stage 4 chronic kidney disease, or unspecified chronic kidney disease: Secondary | ICD-10-CM | POA: Diagnosis not present

## 2020-03-31 DIAGNOSIS — Z8673 Personal history of transient ischemic attack (TIA), and cerebral infarction without residual deficits: Secondary | ICD-10-CM | POA: Diagnosis not present

## 2020-03-31 DIAGNOSIS — Z741 Need for assistance with personal care: Secondary | ICD-10-CM | POA: Diagnosis not present

## 2020-03-31 DIAGNOSIS — R2681 Unsteadiness on feet: Secondary | ICD-10-CM | POA: Diagnosis not present

## 2020-03-31 DIAGNOSIS — R4189 Other symptoms and signs involving cognitive functions and awareness: Secondary | ICD-10-CM | POA: Diagnosis not present

## 2020-03-31 DIAGNOSIS — R278 Other lack of coordination: Secondary | ICD-10-CM | POA: Diagnosis not present

## 2020-03-31 DIAGNOSIS — M6281 Muscle weakness (generalized): Secondary | ICD-10-CM | POA: Diagnosis not present

## 2020-04-02 DIAGNOSIS — Z741 Need for assistance with personal care: Secondary | ICD-10-CM | POA: Diagnosis not present

## 2020-04-02 DIAGNOSIS — I130 Hypertensive heart and chronic kidney disease with heart failure and stage 1 through stage 4 chronic kidney disease, or unspecified chronic kidney disease: Secondary | ICD-10-CM | POA: Diagnosis not present

## 2020-04-02 DIAGNOSIS — Z8673 Personal history of transient ischemic attack (TIA), and cerebral infarction without residual deficits: Secondary | ICD-10-CM | POA: Diagnosis not present

## 2020-04-02 DIAGNOSIS — R2681 Unsteadiness on feet: Secondary | ICD-10-CM | POA: Diagnosis not present

## 2020-04-02 DIAGNOSIS — I4821 Permanent atrial fibrillation: Secondary | ICD-10-CM | POA: Diagnosis not present

## 2020-04-02 DIAGNOSIS — N1832 Chronic kidney disease, stage 3b: Secondary | ICD-10-CM | POA: Diagnosis not present

## 2020-04-06 DIAGNOSIS — I4821 Permanent atrial fibrillation: Secondary | ICD-10-CM | POA: Diagnosis not present

## 2020-04-06 DIAGNOSIS — Z8673 Personal history of transient ischemic attack (TIA), and cerebral infarction without residual deficits: Secondary | ICD-10-CM | POA: Diagnosis not present

## 2020-04-06 DIAGNOSIS — R2681 Unsteadiness on feet: Secondary | ICD-10-CM | POA: Diagnosis not present

## 2020-04-06 DIAGNOSIS — N1832 Chronic kidney disease, stage 3b: Secondary | ICD-10-CM | POA: Diagnosis not present

## 2020-04-06 DIAGNOSIS — Z741 Need for assistance with personal care: Secondary | ICD-10-CM | POA: Diagnosis not present

## 2020-04-06 DIAGNOSIS — I130 Hypertensive heart and chronic kidney disease with heart failure and stage 1 through stage 4 chronic kidney disease, or unspecified chronic kidney disease: Secondary | ICD-10-CM | POA: Diagnosis not present

## 2020-04-07 DIAGNOSIS — I4821 Permanent atrial fibrillation: Secondary | ICD-10-CM | POA: Diagnosis not present

## 2020-04-07 DIAGNOSIS — I130 Hypertensive heart and chronic kidney disease with heart failure and stage 1 through stage 4 chronic kidney disease, or unspecified chronic kidney disease: Secondary | ICD-10-CM | POA: Diagnosis not present

## 2020-04-07 DIAGNOSIS — Z741 Need for assistance with personal care: Secondary | ICD-10-CM | POA: Diagnosis not present

## 2020-04-07 DIAGNOSIS — N1832 Chronic kidney disease, stage 3b: Secondary | ICD-10-CM | POA: Diagnosis not present

## 2020-04-07 DIAGNOSIS — R2681 Unsteadiness on feet: Secondary | ICD-10-CM | POA: Diagnosis not present

## 2020-04-07 DIAGNOSIS — Z8673 Personal history of transient ischemic attack (TIA), and cerebral infarction without residual deficits: Secondary | ICD-10-CM | POA: Diagnosis not present

## 2020-04-09 DIAGNOSIS — R2681 Unsteadiness on feet: Secondary | ICD-10-CM | POA: Diagnosis not present

## 2020-04-09 DIAGNOSIS — I4819 Other persistent atrial fibrillation: Secondary | ICD-10-CM | POA: Diagnosis not present

## 2020-04-09 DIAGNOSIS — I4821 Permanent atrial fibrillation: Secondary | ICD-10-CM | POA: Diagnosis not present

## 2020-04-09 DIAGNOSIS — F0150 Vascular dementia without behavioral disturbance: Secondary | ICD-10-CM | POA: Diagnosis not present

## 2020-04-09 DIAGNOSIS — Z741 Need for assistance with personal care: Secondary | ICD-10-CM | POA: Diagnosis not present

## 2020-04-09 DIAGNOSIS — N400 Enlarged prostate without lower urinary tract symptoms: Secondary | ICD-10-CM | POA: Diagnosis not present

## 2020-04-09 DIAGNOSIS — I130 Hypertensive heart and chronic kidney disease with heart failure and stage 1 through stage 4 chronic kidney disease, or unspecified chronic kidney disease: Secondary | ICD-10-CM | POA: Diagnosis not present

## 2020-04-09 DIAGNOSIS — Z8673 Personal history of transient ischemic attack (TIA), and cerebral infarction without residual deficits: Secondary | ICD-10-CM | POA: Diagnosis not present

## 2020-04-09 DIAGNOSIS — F39 Unspecified mood [affective] disorder: Secondary | ICD-10-CM | POA: Diagnosis not present

## 2020-04-09 DIAGNOSIS — I5032 Chronic diastolic (congestive) heart failure: Secondary | ICD-10-CM | POA: Diagnosis not present

## 2020-04-09 DIAGNOSIS — N1832 Chronic kidney disease, stage 3b: Secondary | ICD-10-CM | POA: Diagnosis not present

## 2020-04-10 DIAGNOSIS — Z8673 Personal history of transient ischemic attack (TIA), and cerebral infarction without residual deficits: Secondary | ICD-10-CM | POA: Diagnosis not present

## 2020-04-10 DIAGNOSIS — N1832 Chronic kidney disease, stage 3b: Secondary | ICD-10-CM | POA: Diagnosis not present

## 2020-04-10 DIAGNOSIS — I4821 Permanent atrial fibrillation: Secondary | ICD-10-CM | POA: Diagnosis not present

## 2020-04-10 DIAGNOSIS — R2681 Unsteadiness on feet: Secondary | ICD-10-CM | POA: Diagnosis not present

## 2020-04-10 DIAGNOSIS — I130 Hypertensive heart and chronic kidney disease with heart failure and stage 1 through stage 4 chronic kidney disease, or unspecified chronic kidney disease: Secondary | ICD-10-CM | POA: Diagnosis not present

## 2020-04-10 DIAGNOSIS — Z741 Need for assistance with personal care: Secondary | ICD-10-CM | POA: Diagnosis not present

## 2020-04-13 DIAGNOSIS — Z8673 Personal history of transient ischemic attack (TIA), and cerebral infarction without residual deficits: Secondary | ICD-10-CM | POA: Diagnosis not present

## 2020-04-13 DIAGNOSIS — Z741 Need for assistance with personal care: Secondary | ICD-10-CM | POA: Diagnosis not present

## 2020-04-13 DIAGNOSIS — N1832 Chronic kidney disease, stage 3b: Secondary | ICD-10-CM | POA: Diagnosis not present

## 2020-04-13 DIAGNOSIS — I130 Hypertensive heart and chronic kidney disease with heart failure and stage 1 through stage 4 chronic kidney disease, or unspecified chronic kidney disease: Secondary | ICD-10-CM | POA: Diagnosis not present

## 2020-04-13 DIAGNOSIS — R2681 Unsteadiness on feet: Secondary | ICD-10-CM | POA: Diagnosis not present

## 2020-04-13 DIAGNOSIS — I4821 Permanent atrial fibrillation: Secondary | ICD-10-CM | POA: Diagnosis not present

## 2020-04-14 DIAGNOSIS — R2681 Unsteadiness on feet: Secondary | ICD-10-CM | POA: Diagnosis not present

## 2020-04-14 DIAGNOSIS — Z741 Need for assistance with personal care: Secondary | ICD-10-CM | POA: Diagnosis not present

## 2020-04-14 DIAGNOSIS — N1832 Chronic kidney disease, stage 3b: Secondary | ICD-10-CM | POA: Diagnosis not present

## 2020-04-14 DIAGNOSIS — I4821 Permanent atrial fibrillation: Secondary | ICD-10-CM | POA: Diagnosis not present

## 2020-04-14 DIAGNOSIS — I130 Hypertensive heart and chronic kidney disease with heart failure and stage 1 through stage 4 chronic kidney disease, or unspecified chronic kidney disease: Secondary | ICD-10-CM | POA: Diagnosis not present

## 2020-04-14 DIAGNOSIS — Z8673 Personal history of transient ischemic attack (TIA), and cerebral infarction without residual deficits: Secondary | ICD-10-CM | POA: Diagnosis not present

## 2020-04-15 DIAGNOSIS — Z8673 Personal history of transient ischemic attack (TIA), and cerebral infarction without residual deficits: Secondary | ICD-10-CM | POA: Diagnosis not present

## 2020-04-15 DIAGNOSIS — I4821 Permanent atrial fibrillation: Secondary | ICD-10-CM | POA: Diagnosis not present

## 2020-04-15 DIAGNOSIS — R2681 Unsteadiness on feet: Secondary | ICD-10-CM | POA: Diagnosis not present

## 2020-04-15 DIAGNOSIS — Z741 Need for assistance with personal care: Secondary | ICD-10-CM | POA: Diagnosis not present

## 2020-04-15 DIAGNOSIS — I130 Hypertensive heart and chronic kidney disease with heart failure and stage 1 through stage 4 chronic kidney disease, or unspecified chronic kidney disease: Secondary | ICD-10-CM | POA: Diagnosis not present

## 2020-04-15 DIAGNOSIS — N1832 Chronic kidney disease, stage 3b: Secondary | ICD-10-CM | POA: Diagnosis not present

## 2020-04-16 DIAGNOSIS — I4821 Permanent atrial fibrillation: Secondary | ICD-10-CM | POA: Diagnosis not present

## 2020-04-16 DIAGNOSIS — Z741 Need for assistance with personal care: Secondary | ICD-10-CM | POA: Diagnosis not present

## 2020-04-16 DIAGNOSIS — R2681 Unsteadiness on feet: Secondary | ICD-10-CM | POA: Diagnosis not present

## 2020-04-16 DIAGNOSIS — I130 Hypertensive heart and chronic kidney disease with heart failure and stage 1 through stage 4 chronic kidney disease, or unspecified chronic kidney disease: Secondary | ICD-10-CM | POA: Diagnosis not present

## 2020-04-16 DIAGNOSIS — N1832 Chronic kidney disease, stage 3b: Secondary | ICD-10-CM | POA: Diagnosis not present

## 2020-04-16 DIAGNOSIS — Z8673 Personal history of transient ischemic attack (TIA), and cerebral infarction without residual deficits: Secondary | ICD-10-CM | POA: Diagnosis not present

## 2020-04-17 DIAGNOSIS — I130 Hypertensive heart and chronic kidney disease with heart failure and stage 1 through stage 4 chronic kidney disease, or unspecified chronic kidney disease: Secondary | ICD-10-CM | POA: Diagnosis not present

## 2020-04-17 DIAGNOSIS — N1832 Chronic kidney disease, stage 3b: Secondary | ICD-10-CM | POA: Diagnosis not present

## 2020-04-17 DIAGNOSIS — R2681 Unsteadiness on feet: Secondary | ICD-10-CM | POA: Diagnosis not present

## 2020-04-17 DIAGNOSIS — I4821 Permanent atrial fibrillation: Secondary | ICD-10-CM | POA: Diagnosis not present

## 2020-04-17 DIAGNOSIS — Z741 Need for assistance with personal care: Secondary | ICD-10-CM | POA: Diagnosis not present

## 2020-04-17 DIAGNOSIS — Z8673 Personal history of transient ischemic attack (TIA), and cerebral infarction without residual deficits: Secondary | ICD-10-CM | POA: Diagnosis not present

## 2020-04-20 DIAGNOSIS — R2681 Unsteadiness on feet: Secondary | ICD-10-CM | POA: Diagnosis not present

## 2020-04-20 DIAGNOSIS — N1832 Chronic kidney disease, stage 3b: Secondary | ICD-10-CM | POA: Diagnosis not present

## 2020-04-20 DIAGNOSIS — I130 Hypertensive heart and chronic kidney disease with heart failure and stage 1 through stage 4 chronic kidney disease, or unspecified chronic kidney disease: Secondary | ICD-10-CM | POA: Diagnosis not present

## 2020-04-20 DIAGNOSIS — Z8673 Personal history of transient ischemic attack (TIA), and cerebral infarction without residual deficits: Secondary | ICD-10-CM | POA: Diagnosis not present

## 2020-04-20 DIAGNOSIS — I4821 Permanent atrial fibrillation: Secondary | ICD-10-CM | POA: Diagnosis not present

## 2020-04-20 DIAGNOSIS — Z741 Need for assistance with personal care: Secondary | ICD-10-CM | POA: Diagnosis not present

## 2020-04-21 DIAGNOSIS — Z8673 Personal history of transient ischemic attack (TIA), and cerebral infarction without residual deficits: Secondary | ICD-10-CM | POA: Diagnosis not present

## 2020-04-21 DIAGNOSIS — N1832 Chronic kidney disease, stage 3b: Secondary | ICD-10-CM | POA: Diagnosis not present

## 2020-04-21 DIAGNOSIS — I4821 Permanent atrial fibrillation: Secondary | ICD-10-CM | POA: Diagnosis not present

## 2020-04-21 DIAGNOSIS — Z741 Need for assistance with personal care: Secondary | ICD-10-CM | POA: Diagnosis not present

## 2020-04-21 DIAGNOSIS — R2681 Unsteadiness on feet: Secondary | ICD-10-CM | POA: Diagnosis not present

## 2020-04-21 DIAGNOSIS — I130 Hypertensive heart and chronic kidney disease with heart failure and stage 1 through stage 4 chronic kidney disease, or unspecified chronic kidney disease: Secondary | ICD-10-CM | POA: Diagnosis not present

## 2020-04-23 DIAGNOSIS — I130 Hypertensive heart and chronic kidney disease with heart failure and stage 1 through stage 4 chronic kidney disease, or unspecified chronic kidney disease: Secondary | ICD-10-CM | POA: Diagnosis not present

## 2020-04-23 DIAGNOSIS — R2681 Unsteadiness on feet: Secondary | ICD-10-CM | POA: Diagnosis not present

## 2020-04-23 DIAGNOSIS — Z741 Need for assistance with personal care: Secondary | ICD-10-CM | POA: Diagnosis not present

## 2020-04-23 DIAGNOSIS — I4821 Permanent atrial fibrillation: Secondary | ICD-10-CM | POA: Diagnosis not present

## 2020-04-23 DIAGNOSIS — Z8673 Personal history of transient ischemic attack (TIA), and cerebral infarction without residual deficits: Secondary | ICD-10-CM | POA: Diagnosis not present

## 2020-04-23 DIAGNOSIS — N1832 Chronic kidney disease, stage 3b: Secondary | ICD-10-CM | POA: Diagnosis not present

## 2020-04-24 DIAGNOSIS — I130 Hypertensive heart and chronic kidney disease with heart failure and stage 1 through stage 4 chronic kidney disease, or unspecified chronic kidney disease: Secondary | ICD-10-CM | POA: Diagnosis not present

## 2020-04-24 DIAGNOSIS — R2681 Unsteadiness on feet: Secondary | ICD-10-CM | POA: Diagnosis not present

## 2020-04-24 DIAGNOSIS — Z741 Need for assistance with personal care: Secondary | ICD-10-CM | POA: Diagnosis not present

## 2020-04-24 DIAGNOSIS — I4821 Permanent atrial fibrillation: Secondary | ICD-10-CM | POA: Diagnosis not present

## 2020-04-24 DIAGNOSIS — N1832 Chronic kidney disease, stage 3b: Secondary | ICD-10-CM | POA: Diagnosis not present

## 2020-04-24 DIAGNOSIS — Z8673 Personal history of transient ischemic attack (TIA), and cerebral infarction without residual deficits: Secondary | ICD-10-CM | POA: Diagnosis not present

## 2020-04-28 DIAGNOSIS — I130 Hypertensive heart and chronic kidney disease with heart failure and stage 1 through stage 4 chronic kidney disease, or unspecified chronic kidney disease: Secondary | ICD-10-CM | POA: Diagnosis not present

## 2020-04-28 DIAGNOSIS — N1832 Chronic kidney disease, stage 3b: Secondary | ICD-10-CM | POA: Diagnosis not present

## 2020-04-28 DIAGNOSIS — G2581 Restless legs syndrome: Secondary | ICD-10-CM | POA: Diagnosis not present

## 2020-04-28 DIAGNOSIS — R2681 Unsteadiness on feet: Secondary | ICD-10-CM | POA: Diagnosis not present

## 2020-04-28 DIAGNOSIS — R278 Other lack of coordination: Secondary | ICD-10-CM | POA: Diagnosis not present

## 2020-04-28 DIAGNOSIS — I4821 Permanent atrial fibrillation: Secondary | ICD-10-CM | POA: Diagnosis not present

## 2020-04-28 DIAGNOSIS — Z8673 Personal history of transient ischemic attack (TIA), and cerebral infarction without residual deficits: Secondary | ICD-10-CM | POA: Diagnosis not present

## 2020-04-28 DIAGNOSIS — Z741 Need for assistance with personal care: Secondary | ICD-10-CM | POA: Diagnosis not present

## 2020-04-28 DIAGNOSIS — R262 Difficulty in walking, not elsewhere classified: Secondary | ICD-10-CM | POA: Diagnosis not present

## 2020-04-28 DIAGNOSIS — E876 Hypokalemia: Secondary | ICD-10-CM | POA: Diagnosis not present

## 2020-04-28 DIAGNOSIS — R4189 Other symptoms and signs involving cognitive functions and awareness: Secondary | ICD-10-CM | POA: Diagnosis not present

## 2020-04-29 DIAGNOSIS — N1832 Chronic kidney disease, stage 3b: Secondary | ICD-10-CM | POA: Diagnosis not present

## 2020-04-29 DIAGNOSIS — I4821 Permanent atrial fibrillation: Secondary | ICD-10-CM | POA: Diagnosis not present

## 2020-04-29 DIAGNOSIS — I130 Hypertensive heart and chronic kidney disease with heart failure and stage 1 through stage 4 chronic kidney disease, or unspecified chronic kidney disease: Secondary | ICD-10-CM | POA: Diagnosis not present

## 2020-04-29 DIAGNOSIS — E876 Hypokalemia: Secondary | ICD-10-CM | POA: Diagnosis not present

## 2020-04-29 DIAGNOSIS — Z8673 Personal history of transient ischemic attack (TIA), and cerebral infarction without residual deficits: Secondary | ICD-10-CM | POA: Diagnosis not present

## 2020-04-29 DIAGNOSIS — R2681 Unsteadiness on feet: Secondary | ICD-10-CM | POA: Diagnosis not present

## 2020-04-30 DIAGNOSIS — I4821 Permanent atrial fibrillation: Secondary | ICD-10-CM | POA: Diagnosis not present

## 2020-04-30 DIAGNOSIS — E876 Hypokalemia: Secondary | ICD-10-CM | POA: Diagnosis not present

## 2020-04-30 DIAGNOSIS — Z8673 Personal history of transient ischemic attack (TIA), and cerebral infarction without residual deficits: Secondary | ICD-10-CM | POA: Diagnosis not present

## 2020-04-30 DIAGNOSIS — N1832 Chronic kidney disease, stage 3b: Secondary | ICD-10-CM | POA: Diagnosis not present

## 2020-04-30 DIAGNOSIS — R2681 Unsteadiness on feet: Secondary | ICD-10-CM | POA: Diagnosis not present

## 2020-04-30 DIAGNOSIS — I130 Hypertensive heart and chronic kidney disease with heart failure and stage 1 through stage 4 chronic kidney disease, or unspecified chronic kidney disease: Secondary | ICD-10-CM | POA: Diagnosis not present

## 2020-05-04 DIAGNOSIS — Z8673 Personal history of transient ischemic attack (TIA), and cerebral infarction without residual deficits: Secondary | ICD-10-CM | POA: Diagnosis not present

## 2020-05-04 DIAGNOSIS — I4821 Permanent atrial fibrillation: Secondary | ICD-10-CM | POA: Diagnosis not present

## 2020-05-04 DIAGNOSIS — E876 Hypokalemia: Secondary | ICD-10-CM | POA: Diagnosis not present

## 2020-05-04 DIAGNOSIS — N1832 Chronic kidney disease, stage 3b: Secondary | ICD-10-CM | POA: Diagnosis not present

## 2020-05-04 DIAGNOSIS — I130 Hypertensive heart and chronic kidney disease with heart failure and stage 1 through stage 4 chronic kidney disease, or unspecified chronic kidney disease: Secondary | ICD-10-CM | POA: Diagnosis not present

## 2020-05-04 DIAGNOSIS — R2681 Unsteadiness on feet: Secondary | ICD-10-CM | POA: Diagnosis not present

## 2020-05-06 DIAGNOSIS — Z08 Encounter for follow-up examination after completed treatment for malignant neoplasm: Secondary | ICD-10-CM | POA: Diagnosis not present

## 2020-05-06 DIAGNOSIS — Z85820 Personal history of malignant melanoma of skin: Secondary | ICD-10-CM | POA: Diagnosis not present

## 2020-05-06 DIAGNOSIS — R600 Localized edema: Secondary | ICD-10-CM | POA: Diagnosis not present

## 2020-05-06 DIAGNOSIS — L97229 Non-pressure chronic ulcer of left calf with unspecified severity: Secondary | ICD-10-CM | POA: Diagnosis not present

## 2020-05-07 DIAGNOSIS — N1832 Chronic kidney disease, stage 3b: Secondary | ICD-10-CM | POA: Diagnosis not present

## 2020-05-07 DIAGNOSIS — E876 Hypokalemia: Secondary | ICD-10-CM | POA: Diagnosis not present

## 2020-05-07 DIAGNOSIS — R2681 Unsteadiness on feet: Secondary | ICD-10-CM | POA: Diagnosis not present

## 2020-05-07 DIAGNOSIS — I4821 Permanent atrial fibrillation: Secondary | ICD-10-CM | POA: Diagnosis not present

## 2020-05-07 DIAGNOSIS — I130 Hypertensive heart and chronic kidney disease with heart failure and stage 1 through stage 4 chronic kidney disease, or unspecified chronic kidney disease: Secondary | ICD-10-CM | POA: Diagnosis not present

## 2020-05-07 DIAGNOSIS — Z8673 Personal history of transient ischemic attack (TIA), and cerebral infarction without residual deficits: Secondary | ICD-10-CM | POA: Diagnosis not present

## 2020-05-08 DIAGNOSIS — N184 Chronic kidney disease, stage 4 (severe): Secondary | ICD-10-CM | POA: Diagnosis not present

## 2020-05-08 DIAGNOSIS — I5030 Unspecified diastolic (congestive) heart failure: Secondary | ICD-10-CM | POA: Diagnosis not present

## 2020-05-11 DIAGNOSIS — H34832 Tributary (branch) retinal vein occlusion, left eye, with macular edema: Secondary | ICD-10-CM | POA: Diagnosis not present

## 2020-05-11 DIAGNOSIS — E876 Hypokalemia: Secondary | ICD-10-CM | POA: Diagnosis not present

## 2020-05-11 DIAGNOSIS — I130 Hypertensive heart and chronic kidney disease with heart failure and stage 1 through stage 4 chronic kidney disease, or unspecified chronic kidney disease: Secondary | ICD-10-CM | POA: Diagnosis not present

## 2020-05-11 DIAGNOSIS — R059 Cough, unspecified: Secondary | ICD-10-CM | POA: Diagnosis not present

## 2020-05-11 DIAGNOSIS — N1832 Chronic kidney disease, stage 3b: Secondary | ICD-10-CM | POA: Diagnosis not present

## 2020-05-11 DIAGNOSIS — I4821 Permanent atrial fibrillation: Secondary | ICD-10-CM | POA: Diagnosis not present

## 2020-05-11 DIAGNOSIS — Z8673 Personal history of transient ischemic attack (TIA), and cerebral infarction without residual deficits: Secondary | ICD-10-CM | POA: Diagnosis not present

## 2020-05-11 DIAGNOSIS — R2681 Unsteadiness on feet: Secondary | ICD-10-CM | POA: Diagnosis not present

## 2020-05-12 DIAGNOSIS — R2681 Unsteadiness on feet: Secondary | ICD-10-CM | POA: Diagnosis not present

## 2020-05-12 DIAGNOSIS — I4821 Permanent atrial fibrillation: Secondary | ICD-10-CM | POA: Diagnosis not present

## 2020-05-12 DIAGNOSIS — N1832 Chronic kidney disease, stage 3b: Secondary | ICD-10-CM | POA: Diagnosis not present

## 2020-05-12 DIAGNOSIS — Z8673 Personal history of transient ischemic attack (TIA), and cerebral infarction without residual deficits: Secondary | ICD-10-CM | POA: Diagnosis not present

## 2020-05-12 DIAGNOSIS — I130 Hypertensive heart and chronic kidney disease with heart failure and stage 1 through stage 4 chronic kidney disease, or unspecified chronic kidney disease: Secondary | ICD-10-CM | POA: Diagnosis not present

## 2020-05-12 DIAGNOSIS — E876 Hypokalemia: Secondary | ICD-10-CM | POA: Diagnosis not present

## 2020-05-18 DIAGNOSIS — Z8673 Personal history of transient ischemic attack (TIA), and cerebral infarction without residual deficits: Secondary | ICD-10-CM | POA: Diagnosis not present

## 2020-05-18 DIAGNOSIS — R2681 Unsteadiness on feet: Secondary | ICD-10-CM | POA: Diagnosis not present

## 2020-05-18 DIAGNOSIS — I4821 Permanent atrial fibrillation: Secondary | ICD-10-CM | POA: Diagnosis not present

## 2020-05-18 DIAGNOSIS — I130 Hypertensive heart and chronic kidney disease with heart failure and stage 1 through stage 4 chronic kidney disease, or unspecified chronic kidney disease: Secondary | ICD-10-CM | POA: Diagnosis not present

## 2020-05-18 DIAGNOSIS — N1832 Chronic kidney disease, stage 3b: Secondary | ICD-10-CM | POA: Diagnosis not present

## 2020-05-18 DIAGNOSIS — E876 Hypokalemia: Secondary | ICD-10-CM | POA: Diagnosis not present

## 2020-05-20 ENCOUNTER — Encounter: Payer: Self-pay | Admitting: Internal Medicine

## 2020-05-22 DIAGNOSIS — E876 Hypokalemia: Secondary | ICD-10-CM | POA: Diagnosis not present

## 2020-05-22 DIAGNOSIS — Z8673 Personal history of transient ischemic attack (TIA), and cerebral infarction without residual deficits: Secondary | ICD-10-CM | POA: Diagnosis not present

## 2020-05-22 DIAGNOSIS — N1832 Chronic kidney disease, stage 3b: Secondary | ICD-10-CM | POA: Diagnosis not present

## 2020-05-22 DIAGNOSIS — I4821 Permanent atrial fibrillation: Secondary | ICD-10-CM | POA: Diagnosis not present

## 2020-05-22 DIAGNOSIS — R2681 Unsteadiness on feet: Secondary | ICD-10-CM | POA: Diagnosis not present

## 2020-05-22 DIAGNOSIS — I130 Hypertensive heart and chronic kidney disease with heart failure and stage 1 through stage 4 chronic kidney disease, or unspecified chronic kidney disease: Secondary | ICD-10-CM | POA: Diagnosis not present

## 2020-05-25 DIAGNOSIS — R2681 Unsteadiness on feet: Secondary | ICD-10-CM | POA: Diagnosis not present

## 2020-05-25 DIAGNOSIS — I130 Hypertensive heart and chronic kidney disease with heart failure and stage 1 through stage 4 chronic kidney disease, or unspecified chronic kidney disease: Secondary | ICD-10-CM | POA: Diagnosis not present

## 2020-05-25 DIAGNOSIS — Z8673 Personal history of transient ischemic attack (TIA), and cerebral infarction without residual deficits: Secondary | ICD-10-CM | POA: Diagnosis not present

## 2020-05-25 DIAGNOSIS — E876 Hypokalemia: Secondary | ICD-10-CM | POA: Diagnosis not present

## 2020-05-25 DIAGNOSIS — I4821 Permanent atrial fibrillation: Secondary | ICD-10-CM | POA: Diagnosis not present

## 2020-05-25 DIAGNOSIS — N1832 Chronic kidney disease, stage 3b: Secondary | ICD-10-CM | POA: Diagnosis not present

## 2020-05-28 DIAGNOSIS — I5030 Unspecified diastolic (congestive) heart failure: Secondary | ICD-10-CM | POA: Diagnosis not present

## 2020-05-29 DIAGNOSIS — G2581 Restless legs syndrome: Secondary | ICD-10-CM | POA: Diagnosis not present

## 2020-05-29 DIAGNOSIS — R296 Repeated falls: Secondary | ICD-10-CM | POA: Diagnosis not present

## 2020-05-29 DIAGNOSIS — Z741 Need for assistance with personal care: Secondary | ICD-10-CM | POA: Diagnosis not present

## 2020-05-29 DIAGNOSIS — R4189 Other symptoms and signs involving cognitive functions and awareness: Secondary | ICD-10-CM | POA: Diagnosis not present

## 2020-05-29 DIAGNOSIS — I10 Essential (primary) hypertension: Secondary | ICD-10-CM | POA: Diagnosis not present

## 2020-05-29 DIAGNOSIS — I4821 Permanent atrial fibrillation: Secondary | ICD-10-CM | POA: Diagnosis not present

## 2020-05-29 DIAGNOSIS — E876 Hypokalemia: Secondary | ICD-10-CM | POA: Diagnosis not present

## 2020-05-29 DIAGNOSIS — R278 Other lack of coordination: Secondary | ICD-10-CM | POA: Diagnosis not present

## 2020-06-01 DIAGNOSIS — E876 Hypokalemia: Secondary | ICD-10-CM | POA: Diagnosis not present

## 2020-06-01 DIAGNOSIS — R296 Repeated falls: Secondary | ICD-10-CM | POA: Diagnosis not present

## 2020-06-01 DIAGNOSIS — I4821 Permanent atrial fibrillation: Secondary | ICD-10-CM | POA: Diagnosis not present

## 2020-06-01 DIAGNOSIS — I10 Essential (primary) hypertension: Secondary | ICD-10-CM | POA: Diagnosis not present

## 2020-06-01 DIAGNOSIS — R278 Other lack of coordination: Secondary | ICD-10-CM | POA: Diagnosis not present

## 2020-06-01 DIAGNOSIS — G2581 Restless legs syndrome: Secondary | ICD-10-CM | POA: Diagnosis not present

## 2020-06-03 DIAGNOSIS — F39 Unspecified mood [affective] disorder: Secondary | ICD-10-CM | POA: Diagnosis not present

## 2020-06-03 DIAGNOSIS — I4891 Unspecified atrial fibrillation: Secondary | ICD-10-CM | POA: Diagnosis not present

## 2020-06-03 DIAGNOSIS — N184 Chronic kidney disease, stage 4 (severe): Secondary | ICD-10-CM | POA: Diagnosis not present

## 2020-06-03 DIAGNOSIS — K219 Gastro-esophageal reflux disease without esophagitis: Secondary | ICD-10-CM | POA: Diagnosis not present

## 2020-06-03 DIAGNOSIS — I5030 Unspecified diastolic (congestive) heart failure: Secondary | ICD-10-CM | POA: Diagnosis not present

## 2020-06-03 DIAGNOSIS — G2581 Restless legs syndrome: Secondary | ICD-10-CM

## 2020-06-03 DIAGNOSIS — N401 Enlarged prostate with lower urinary tract symptoms: Secondary | ICD-10-CM

## 2020-06-03 DIAGNOSIS — F0150 Vascular dementia without behavioral disturbance: Secondary | ICD-10-CM | POA: Diagnosis not present

## 2020-06-15 DIAGNOSIS — I10 Essential (primary) hypertension: Secondary | ICD-10-CM | POA: Diagnosis not present

## 2020-06-15 DIAGNOSIS — R278 Other lack of coordination: Secondary | ICD-10-CM | POA: Diagnosis not present

## 2020-06-15 DIAGNOSIS — E876 Hypokalemia: Secondary | ICD-10-CM | POA: Diagnosis not present

## 2020-06-15 DIAGNOSIS — I4821 Permanent atrial fibrillation: Secondary | ICD-10-CM | POA: Diagnosis not present

## 2020-06-15 DIAGNOSIS — G2581 Restless legs syndrome: Secondary | ICD-10-CM | POA: Diagnosis not present

## 2020-06-15 DIAGNOSIS — R296 Repeated falls: Secondary | ICD-10-CM | POA: Diagnosis not present

## 2020-06-24 DIAGNOSIS — R0989 Other specified symptoms and signs involving the circulatory and respiratory systems: Secondary | ICD-10-CM | POA: Diagnosis not present

## 2020-06-24 DIAGNOSIS — I700 Atherosclerosis of aorta: Secondary | ICD-10-CM | POA: Diagnosis not present

## 2020-06-24 DIAGNOSIS — I517 Cardiomegaly: Secondary | ICD-10-CM | POA: Diagnosis not present

## 2020-06-29 DIAGNOSIS — F39 Unspecified mood [affective] disorder: Secondary | ICD-10-CM | POA: Diagnosis not present

## 2020-06-29 DIAGNOSIS — I482 Chronic atrial fibrillation, unspecified: Secondary | ICD-10-CM | POA: Diagnosis not present

## 2020-06-29 DIAGNOSIS — I5030 Unspecified diastolic (congestive) heart failure: Secondary | ICD-10-CM | POA: Diagnosis not present

## 2020-07-16 DIAGNOSIS — Z23 Encounter for immunization: Secondary | ICD-10-CM | POA: Diagnosis not present

## 2020-07-17 DIAGNOSIS — H34832 Tributary (branch) retinal vein occlusion, left eye, with macular edema: Secondary | ICD-10-CM | POA: Diagnosis not present

## 2020-08-13 ENCOUNTER — Ambulatory Visit: Payer: Medicare Other | Admitting: Internal Medicine

## 2020-08-13 ENCOUNTER — Other Ambulatory Visit: Payer: Self-pay

## 2020-08-13 ENCOUNTER — Encounter: Payer: Self-pay | Admitting: Internal Medicine

## 2020-08-13 DIAGNOSIS — N184 Chronic kidney disease, stage 4 (severe): Secondary | ICD-10-CM

## 2020-08-13 DIAGNOSIS — L97201 Non-pressure chronic ulcer of unspecified calf limited to breakdown of skin: Secondary | ICD-10-CM | POA: Diagnosis not present

## 2020-08-13 DIAGNOSIS — I4819 Other persistent atrial fibrillation: Secondary | ICD-10-CM | POA: Diagnosis not present

## 2020-08-13 DIAGNOSIS — I83009 Varicose veins of unspecified lower extremity with ulcer of unspecified site: Secondary | ICD-10-CM | POA: Insufficient documentation

## 2020-08-13 DIAGNOSIS — G2581 Restless legs syndrome: Secondary | ICD-10-CM | POA: Diagnosis not present

## 2020-08-13 DIAGNOSIS — I5032 Chronic diastolic (congestive) heart failure: Secondary | ICD-10-CM

## 2020-08-13 DIAGNOSIS — F0150 Vascular dementia without behavioral disturbance: Secondary | ICD-10-CM

## 2020-08-13 DIAGNOSIS — I83002 Varicose veins of unspecified lower extremity with ulcer of calf: Secondary | ICD-10-CM

## 2020-08-13 DIAGNOSIS — F39 Unspecified mood [affective] disorder: Secondary | ICD-10-CM | POA: Diagnosis not present

## 2020-08-13 DIAGNOSIS — L97909 Non-pressure chronic ulcer of unspecified part of unspecified lower leg with unspecified severity: Secondary | ICD-10-CM | POA: Insufficient documentation

## 2020-08-13 NOTE — Assessment & Plan Note (Signed)
Bilateral and several Discussed trying to avoid picking them as they are granulating now (and not infected)

## 2020-08-13 NOTE — Assessment & Plan Note (Signed)
Will monitor labs every 3 months on his aggressive diuresis schedule On losartan as well

## 2020-08-13 NOTE — Assessment & Plan Note (Signed)
Reasonable volume status on furosemide and twice a week metolazone

## 2020-08-13 NOTE — Assessment & Plan Note (Signed)
Does okay with his meds

## 2020-08-13 NOTE — Progress Notes (Signed)
Subjective:    Patient ID: Stephen Garrett, male    DOB: Nov 13, 1923, 87 y.o.   MRN: [id number]  HPI Initial visit in assisted living apartment since moving here (from [place]) Reviewed status with Luellen Pucker RN  Much happier here than Memory care Has adjusted well Walks with walker Aide to assist with showering 3 times a week (though does it himself at times)  No chest pain or SOB Stays active---out of facility with friends (like for breakfast with friends) Edema persists but not bad Does pick at scabs on calves--but no infections No dizziness No palpitations  Some pain in left shoulder this morning Has pull in right groin also--not limiting  Current Outpatient Medications on File Prior to Visit  Medication Sig Dispense Refill   apixaban (ELIQUIS) 2.5 MG TABS tablet Take 1 tablet (2.5 mg total) by mouth 2 (two) times daily. 60 tablet 1   buPROPion (WELLBUTRIN XL) 150 MG 24 hr tablet TAKE 1 TABLET BY MOUTH DAILY 90 tablet 3   clonazePAM (KLONOPIN) 0.5 MG tablet TAKE ONE TO TWO TABLETS AT BEDTIME (Patient taking differently: Take 0.25 mg by mouth at bedtime.) 180 tablet 0   finasteride (PROSCAR) 5 MG tablet TAKE ONE TABLET BY MOUTH EVERY DAY (Patient taking differently: Take 5 mg by mouth daily.) 90 tablet 3   furosemide (LASIX) 80 MG tablet Take 1 tablet (80 mg total) by mouth daily. If home weight is over 175#--take a second dose after lunch 60 tablet 11   metolazone (ZAROXOLYN) 2.5 MG tablet Take 2.5 mg by mouth 2 (two) times a week.     Multiple Vitamin (MULTIVITAMIN) capsule Take 1 capsule by mouth daily.     omeprazole (PRILOSEC) 40 MG capsule TAKE 1 CAPSULE BY MOUTH ONCE DAILY 90 capsule 3   potassium chloride (KLOR-CON) 10 MEQ tablet TAKE ONE TABLET EVERY MORNING 90 tablet 3   pramipexole (MIRAPEX) 1 MG tablet TAKE ONE TABLET AT DINNER AND ONE TABLETAT BEDTIME (Patient taking differently: Take 1 mg by mouth See admin instructions. Take 1 tablet (1mg ) by mouth at suppertime  and take 1 tablet (1mg ) by mouth at bedtime) 180 tablet 3   pyridoxine (B-6) 100 MG tablet Take 100 mg by mouth daily.     tamsulosin (FLOMAX) 0.4 MG CAPS capsule TAKE 1 CAPSULE EVERY DAY (Patient taking differently: Take 0.4 mg by mouth daily.) 90 capsule 3   losartan (COZAAR) 50 MG tablet Take 1 tablet (50 mg total) by mouth daily. 90 tablet 3   [DISCONTINUED] citalopram (CELEXA) 10 MG tablet Take 1 tablet (10 mg total) by mouth daily. 30 tablet 5   No current facility-administered medications on file prior to visit.    Allergies  Allergen Reactions   Atorvastatin     REACTION: raises blood pressure   Pravastatin Sodium     REACTION: raises blood pressure   Statins     REACTION: raises bp    Past Medical History:  Diagnosis Date   Actinic keratosis    BPH (benign prostatic hypertrophy)    Diseases of lips    Diverticulosis of colon 06/2003   ED (erectile dysfunction)    GERD (gastroesophageal reflux disease)    Heart murmur    as child   HLD (hyperlipidemia)    HOH (hard of hearing)    Cochlear implant right, hearing aid left   HTN (hypertension)    Numbness and tingling in both hands    intermittent, trying braces    Osteoarthritis of  knee    severe, bilat   Renal insufficiency    Restless legs syndrome (RLS)    Sleep disturbance, unspecified    Spinal stenosis, lumbar region, without neurogenic claudication     Past Surgical History:  Procedure Laterality Date   CATARACT EXTRACTION, BILATERAL  2011   Dr. Thomasene Ripple   COCHLEAR IMPLANT Right 02/15/2017   Procedure: RIGHT COCHLEAR IMPLANT;  Surgeon: Vicie Mutters, MD;  Location: [facility] [place];  Service: ENT;  Laterality: Right;   FINGER SURGERY     Right 4th finger   [facility]  10/2009   Dr. Bary Castilla   LUMBAR SPINE SURGERY  06/2005   stenosis repair   PTOSIS REPAIR Bilateral 04/19/2019   Procedure: BLEPHAROPTOSIS REPAIR; RESECT SKIN;  Surgeon: Karle Starch, MD;  Location: [facility];  Service:  Ophthalmology;  Laterality: Bilateral;   TONSILLECTOMY  childhood   TOTAL KNEE ARTHROPLASTY  06/2008   bilat (Dr. Marry Guan)    Family History  Problem Relation Age of Onset   Other Father        "clogged arteries"   Hypertension Mother     Social History   Socioeconomic History   Marital status: Widowed    Spouse name: Not on file   Number of children: 2   Years of education: Not on file   Highest education level: Not on file  Occupational History   Occupation: Retired  Tobacco Use   Smoking status: Former    Packs/day: 0.75    Years: 30.00    Pack years: 22.50    Types: Cigarettes    Quit date: 02/28/1970    Years since quitting: 50.4   Smokeless tobacco: Never  Vaping Use   Vaping Use: Never used  Substance and Sexual Activity   Alcohol use: Yes    Alcohol/week: 0.0 standard drinks    Comment: 1-2 glasses wine/daily   Drug use: No   Sexual activity: Not on file  Other Topics Concern   Not on file  Social History Narrative   Widowed 2010   2 adopted children   Has living will   Daughter is now health care power of attorney   Would accept resuscitation but no prolonged ventilation   No tube feeds if cognitively unaware   Social Determinants of Health   Financial Resource Strain: Not on file  Food Insecurity: Not on file  Transportation Needs: Not on file  Physical Activity: Not on file  Stress: Not on file  Social Connections: Not on file  Intimate Partner Violence: Not on file   Review of Systems Sleeps okay usually Bowels move fine Voids okay---nocturia at least twice (no change)    Objective:   Physical Exam Constitutional:      Appearance: Normal appearance.  Cardiovascular:     Rate and Rhythm: Normal rate. Rhythm irregular.     Heart sounds:    No gallop.     Comments: Gr 3/6 aortic systolic murmur Pulmonary:     Effort: Pulmonary effort is normal.     Breath sounds: Normal breath sounds. No wheezing or rales.  Abdominal:     Palpations:  Abdomen is soft.     Tenderness: There is no abdominal tenderness.  Musculoskeletal:     Cervical back: Neck supple.     Comments: Calves are full and slightly tight--but not very thick Stasis changes and granulating ulcers on both legs  Lymphadenopathy:     Cervical: No cervical adenopathy.  Neurological:  Mental Status: He is alert.  Psychiatric:        Mood and Affect: Mood normal.        Behavior: Behavior normal.           Assessment & Plan:

## 2020-08-13 NOTE — Assessment & Plan Note (Signed)
Rate is fine Continues on the eliquis

## 2020-08-13 NOTE — Assessment & Plan Note (Signed)
Much happier here in assisted living Continues on the bupropion

## 2020-08-13 NOTE — Assessment & Plan Note (Signed)
Very mild He does note increased memory issues--but doing well with support of staff here in assisted living

## 2020-08-14 ENCOUNTER — Ambulatory Visit: Payer: Medicare Other | Admitting: Cardiology

## 2020-09-11 ENCOUNTER — Other Ambulatory Visit: Payer: Self-pay

## 2020-09-11 ENCOUNTER — Non-Acute Institutional Stay: Payer: Medicare Other | Admitting: Internal Medicine

## 2020-09-11 ENCOUNTER — Encounter: Payer: Self-pay | Admitting: Internal Medicine

## 2020-09-11 VITALS — BP 137/76 | HR 75 | Temp 97.8°F | Resp 20 | Wt 171.0 lb

## 2020-09-11 DIAGNOSIS — R1031 Right lower quadrant pain: Secondary | ICD-10-CM

## 2020-09-11 NOTE — Progress Notes (Signed)
Subjective:    Patient ID: Stephen Garrett, male    DOB: October 28, 1923, 87 y.o.   MRN: [id number]  HPI  Asked to see resident in [address] Resident reports right groin pain.  He reports this started 3 weeks ago.  He reports the pain is intermittent.  He reports he notices it more when he is sitting or laying down.  He describes the pain as sharp and stabbing.  The pain can radiate into his buttocks but not down his legs.  He denies numbness or tingling.  He reports associated weakness when the pain hits.  He denies low back pain, loss of bowel or bladder control.  He has been using Voltaren gel and Tylenol OTC with minimal relief of symptoms.  He denies any injury to the area.  Review of Systems     Past Medical History:  Diagnosis Date   Actinic keratosis    BPH (benign prostatic hypertrophy)    Diseases of lips    Diverticulosis of colon 06/2003   ED (erectile dysfunction)    GERD (gastroesophageal reflux disease)    Heart murmur    as child   HLD (hyperlipidemia)    HOH (hard of hearing)    Cochlear implant right, hearing aid left   HTN (hypertension)    Numbness and tingling in both hands    intermittent, trying braces    Osteoarthritis of knee    severe, bilat   Renal insufficiency    Restless legs syndrome (RLS)    Sleep disturbance, unspecified    Spinal stenosis, lumbar region, without neurogenic claudication     Current Outpatient Medications  Medication Sig Dispense Refill   apixaban (ELIQUIS) 2.5 MG TABS tablet Take 1 tablet (2.5 mg total) by mouth 2 (two) times daily. 60 tablet 1   buPROPion (WELLBUTRIN XL) 150 MG 24 hr tablet TAKE 1 TABLET BY MOUTH DAILY 90 tablet 3   clonazePAM (KLONOPIN) 0.5 MG tablet TAKE ONE TO TWO TABLETS AT BEDTIME (Patient taking differently: Take 0.25 mg by mouth at bedtime.) 180 tablet 0   finasteride (PROSCAR) 5 MG tablet TAKE ONE TABLET BY MOUTH EVERY DAY (Patient taking differently: Take 5 mg by mouth daily.) 90 tablet 3   furosemide  (LASIX) 80 MG tablet Take 1 tablet (80 mg total) by mouth daily. If home weight is over 175#--take a second dose after lunch 60 tablet 11   losartan (COZAAR) 50 MG tablet Take 1 tablet (50 mg total) by mouth daily. 90 tablet 3   metolazone (ZAROXOLYN) 2.5 MG tablet Take 2.5 mg by mouth 2 (two) times a week.     Multiple Vitamin (MULTIVITAMIN) capsule Take 1 capsule by mouth daily.     omeprazole (PRILOSEC) 40 MG capsule TAKE 1 CAPSULE BY MOUTH ONCE DAILY 90 capsule 3   potassium chloride (KLOR-CON) 10 MEQ tablet TAKE ONE TABLET EVERY MORNING 90 tablet 3   pramipexole (MIRAPEX) 1 MG tablet TAKE ONE TABLET AT DINNER AND ONE TABLETAT BEDTIME (Patient taking differently: Take 1 mg by mouth See admin instructions. Take 1 tablet (1mg ) by mouth at suppertime and take 1 tablet (1mg ) by mouth at bedtime) 180 tablet 3   pyridoxine (B-6) 100 MG tablet Take 100 mg by mouth daily.     tamsulosin (FLOMAX) 0.4 MG CAPS capsule TAKE 1 CAPSULE EVERY DAY (Patient taking differently: Take 0.4 mg by mouth daily.) 90 capsule 3   No current facility-administered medications for this visit.    Allergies  Allergen  Reactions   Atorvastatin     REACTION: raises blood pressure   Pravastatin Sodium     REACTION: raises blood pressure   Statins     REACTION: raises bp    Family History  Problem Relation Age of Onset   Other Father        "clogged arteries"   Hypertension Mother     Social History   Socioeconomic History   Marital status: Widowed    Spouse name: Not on file   Number of children: 2   Years of education: Not on file   Highest education level: Not on file  Occupational History   Occupation: Retired  Tobacco Use   Smoking status: Former    Packs/day: 0.75    Years: 30.00    Pack years: 22.50    Types: Cigarettes    Quit date: 02/28/1970    Years since quitting: 50.5   Smokeless tobacco: Never  Vaping Use   Vaping Use: Never used  Substance and Sexual Activity   Alcohol use: Yes     Alcohol/week: 0.0 standard drinks    Comment: 1-2 glasses wine/daily   Drug use: No   Sexual activity: Not on file  Other Topics Concern   Not on file  Social History Narrative   Widowed 2010   2 adopted children   Has living will   Daughter is now health care power of attorney   Would accept resuscitation but no prolonged ventilation   No tube feeds if cognitively unaware   Social Determinants of Health   Financial Resource Strain: Not on file  Food Insecurity: Not on file  Transportation Needs: Not on file  Physical Activity: Not on file  Stress: Not on file  Social Connections: Not on file  Intimate Partner Violence: Not on file     Constitutional: Denies fever, malaise, fatigue, headache or abrupt weight changes.  Respiratory: Denies difficulty breathing, shortness of breath, cough or sputum production.   Cardiovascular: Denies chest pain, chest tightness, palpitations or swelling in the hands or feet.  Gastrointestinal: Denies abdominal pain, bloating, constipation, diarrhea or blood in the stool.  GU: Denies urgency, frequency, pain with urination, burning sensation, blood in urine, odor or discharge. Musculoskeletal: Patient reports right groin pain.  Denies decrease in range of motion, difficulty with gait, or joint pain and swelling.  Skin: Denies redness, rashes, lesions or ulcercations.  Neurological: Denies numbness, tingling or problems with balance and coordination.    No other specific complaints in a complete review of systems (except as listed in HPI above).  Objective:   Physical Exam   BP 137/76   Pulse 75   Temp 97.8 F (36.6 C)   Resp 20   Wt 171 lb (77.6 kg)   SpO2 97%   BMI 26.78 kg/m  Wt Readings from Last 3 Encounters:  09/11/20 171 lb (77.6 kg)  08/13/20 170 lb 3.2 oz (77.2 kg)  03/20/20 175 lb (79.4 kg)    General: Appears his stated age, well developed, well nourished in NAD. Skin: Warm, dry and intact. No rashes  noted. Cardiovascular: Normal rate and rhythm.  Murmur noted.  Pulmonary/Chest: Normal effort and positive vesicular breath sounds. No respiratory distress.  Musculoskeletal: No bony tenderness noted over the lumbar spine or SI joints.  Normal flexion, extension and internal rotation of the right hip.  Pain with external rotation of the right hip.  Pain with palpation over the right groin.  Strength 4/5 RLE, 5/5 LLE. Neurological:  Alert and oriented.   BMET    Component Value Date/Time   NA 142 03/20/2020 1217   K 3.9 03/20/2020 1217   CL 100 03/20/2020 1217   CO2 21 (L) 03/20/2020 1217   GLUCOSE 88 03/20/2020 1217   BUN 52 (H) 03/20/2020 1217   CREATININE 2.41 (H) 03/20/2020 1217   CALCIUM 9.3 03/20/2020 1217   GFRNONAA 24 (L) 03/20/2020 1217   GFRAA 55 (L) 09/29/2017 0428    Lipid Panel     Component Value Date/Time   CHOL 199 01/01/2020 2252   TRIG 70 01/01/2020 2252   HDL 37 (L) 01/01/2020 2252   CHOLHDL 5.4 01/01/2020 2252   VLDL 14 01/01/2020 2252   LDLCALC 148 (H) 01/01/2020 2252    CBC    Component Value Date/Time   WBC 10.1 03/20/2020 1217   RBC 4.14 (L) 03/20/2020 1217   HGB 12.1 (L) 03/20/2020 1217   HCT 39.3 03/20/2020 1217   PLT 159 03/20/2020 1217   MCV 94.9 03/20/2020 1217   MCH 29.2 03/20/2020 1217   MCHC 30.8 03/20/2020 1217   RDW 15.8 (H) 03/20/2020 1217   LYMPHSABS 0.7 03/20/2020 1217   MONOABS 0.7 03/20/2020 1217   EOSABS 0.0 03/20/2020 1217   BASOSABS 0.0 03/20/2020 1217           Assessment & Plan:   Right Groin Pain:  Intermittent Will obtain PT eval for further evaluation and treatment I would want to avoid muscle relaxers at this time given his use of clonazepam at bedtime Encouraged heat and stretching  Will reassess as needed

## 2020-09-11 NOTE — Patient Instructions (Signed)
Adductor Muscle Strain An adductor muscle strain, also called a groin strain or pull, is an injury to the muscles or tendons on the upper, inner part of the thigh. These muscles are called the adductor muscles or groin muscles. They are responsible for moving the legs across the body or pulling the legs together. A muscle strain occurs when a muscle is overstretched and some muscle fibers are torn. An adductor muscle strain can range from mild to severe, depending on how many muscle fibers are affected and whether the muscle fibers are partially or completely torn. What are the causes? Adductor muscle strains usually occur during exercise or while participating in sports. The injury often happens when a sudden, violent force is placed on a muscle, stretching the muscle too far. A strain is more likely to happen when your muscles are not warmed up or if you are not properly conditioned. This injury may be caused by: Stretching the adductor muscles too far or too suddenly, often during side-to-side motion with a sudden change in direction. Putting repeated stress on the adductor muscles over a long period of time. Performing vigorous activity without properly stretching the adductor muscles beforehand. What are the signs or symptoms? Symptoms of this condition include: Pain and tenderness in the groin area. This begins as sharp pain and persists as a dull ache. A popping or snapping feeling when the injury occurs (for severe strains). Swelling or bruising. Muscle spasms. Weakness in the leg. Stiffness in the groin area with decreased ability to move the affected muscles. How is this diagnosed? This condition may be diagnosed based on: Your symptoms and a description of how the injury occurred. A physical exam. Imaging tests, such as: X-rays. These are sometimes needed to rule out a broken bone or cartilage problems. An ultrasound, CT scan, or MRI. These may be done if your health care provider  suspects a complete muscle tear or needs to check for other injuries. How is this treated? An adductor strain will often heal on its own. If needed, this condition may be treated with: PRICE therapy. PRICE stands for protection of the injured area, rest, ice, pressure (compression), and elevation. Medicines to help manage pain and swelling (anti-inflammatory medicines). Crutches. You may be directed to use these for the first few days to minimize your pain. Depending on the severity of the muscle strain, recovery time may vary from a few weeks to several months. Severe injuries often require 4-6 weeks for recovery. In those cases, complete healing can take 4-5 months. Follow these instructions at home: PRICE Therapy  Protect the muscle from being injured again. Rest. Do not use the strained muscle if it causes pain. If directed, put ice on the injured area: Put ice in a plastic bag. Place a towel between your skin and the bag. Leave the ice on for 20 minutes, 2-3 times a day. Do this for the first 2 days after the injury. Apply compression by wrapping the injured area with an elastic bandage as told by your health care provider. Raise (elevate) the injured area above the level of your heart while you are sitting or lying down. General instructions Take over-the-counter and prescription medicines only as told by your health care provider. Walk, stretch, and do exercises as told by your health care provider. Only do these activities if you can do so without any pain. Follow your treatment plan as told by your health care provider. This may include: Physical therapy. Massage. Local electrical stimulation (transcutaneous   electrical nerve stimulation, TENS). How is this prevented? Warm up and stretch before being active. Cool down and stretch after being active. Give your body time to rest between periods of activity. Make sure to use equipment that fits you. Be safe and responsible while  being active to avoid slips and falls. Maintain physical fitness, including: Proper conditioning in the adductor muscles. Overall strength, flexibility, and endurance. Contact a health care provider if: You have increased pain or swelling in the affected area. Your symptoms are not improving or they are getting worse. Summary An adductor muscle strain, also called a groin strain or pull, is an injury to the muscles or tendons on the upper, inner part of the thigh. A muscle strain occurs when a muscle is overstretched and some muscle fibers are torn. Depending on the severity of the muscle strain, recovery time may vary from a few weeks to several months. This information is not intended to replace advice given to you by your health care provider. Make sure you discuss any questions you have with your health care provider. Document Revised: 06/05/2018 Document Reviewed: 07/17/2017 [employer] Patient Education  2022 [employer]  

## 2020-09-21 DIAGNOSIS — H34832 Tributary (branch) retinal vein occlusion, left eye, with macular edema: Secondary | ICD-10-CM | POA: Diagnosis not present

## 2020-09-26 ENCOUNTER — Other Ambulatory Visit: Payer: Self-pay

## 2020-09-26 ENCOUNTER — Emergency Department: Payer: Medicare Other

## 2020-09-26 ENCOUNTER — Inpatient Hospital Stay
Admission: EM | Admit: 2020-09-26 | Discharge: 2020-09-28 | DRG: 683 | Disposition: A | Discharge status: Home Health | Payer: Medicare Other | Source: Skilled Nursing Facility | Attending: Internal Medicine | Admitting: Internal Medicine

## 2020-09-26 DIAGNOSIS — R52 Pain, unspecified: Secondary | ICD-10-CM | POA: Diagnosis not present

## 2020-09-26 DIAGNOSIS — Z7901 Long term (current) use of anticoagulants: Secondary | ICD-10-CM

## 2020-09-26 DIAGNOSIS — R600 Localized edema: Secondary | ICD-10-CM

## 2020-09-26 DIAGNOSIS — R5381 Other malaise: Secondary | ICD-10-CM | POA: Diagnosis not present

## 2020-09-26 DIAGNOSIS — Z20822 Contact with and (suspected) exposure to covid-19: Secondary | ICD-10-CM | POA: Diagnosis present

## 2020-09-26 DIAGNOSIS — Z96651 Presence of right artificial knee joint: Secondary | ICD-10-CM | POA: Diagnosis present

## 2020-09-26 DIAGNOSIS — I482 Chronic atrial fibrillation, unspecified: Secondary | ICD-10-CM | POA: Diagnosis present

## 2020-09-26 DIAGNOSIS — I517 Cardiomegaly: Secondary | ICD-10-CM | POA: Diagnosis not present

## 2020-09-26 DIAGNOSIS — I130 Hypertensive heart and chronic kidney disease with heart failure and stage 1 through stage 4 chronic kidney disease, or unspecified chronic kidney disease: Secondary | ICD-10-CM | POA: Diagnosis present

## 2020-09-26 DIAGNOSIS — R531 Weakness: Secondary | ICD-10-CM

## 2020-09-26 DIAGNOSIS — M25551 Pain in right hip: Secondary | ICD-10-CM | POA: Diagnosis not present

## 2020-09-26 DIAGNOSIS — D649 Anemia, unspecified: Secondary | ICD-10-CM

## 2020-09-26 DIAGNOSIS — I4891 Unspecified atrial fibrillation: Secondary | ICD-10-CM | POA: Diagnosis present

## 2020-09-26 DIAGNOSIS — E876 Hypokalemia: Secondary | ICD-10-CM

## 2020-09-26 DIAGNOSIS — Z66 Do not resuscitate: Secondary | ICD-10-CM | POA: Diagnosis present

## 2020-09-26 DIAGNOSIS — M79605 Pain in left leg: Secondary | ICD-10-CM | POA: Diagnosis not present

## 2020-09-26 DIAGNOSIS — I10 Essential (primary) hypertension: Secondary | ICD-10-CM | POA: Diagnosis present

## 2020-09-26 DIAGNOSIS — N189 Chronic kidney disease, unspecified: Secondary | ICD-10-CM

## 2020-09-26 DIAGNOSIS — N400 Enlarged prostate without lower urinary tract symptoms: Secondary | ICD-10-CM | POA: Diagnosis present

## 2020-09-26 DIAGNOSIS — N179 Acute kidney failure, unspecified: Secondary | ICD-10-CM | POA: Diagnosis not present

## 2020-09-26 DIAGNOSIS — N184 Chronic kidney disease, stage 4 (severe): Secondary | ICD-10-CM | POA: Diagnosis present

## 2020-09-26 DIAGNOSIS — M79604 Pain in right leg: Secondary | ICD-10-CM | POA: Diagnosis not present

## 2020-09-26 DIAGNOSIS — I5032 Chronic diastolic (congestive) heart failure: Secondary | ICD-10-CM | POA: Diagnosis present

## 2020-09-26 DIAGNOSIS — F419 Anxiety disorder, unspecified: Secondary | ICD-10-CM | POA: Diagnosis present

## 2020-09-26 DIAGNOSIS — F32A Depression, unspecified: Secondary | ICD-10-CM | POA: Diagnosis present

## 2020-09-26 DIAGNOSIS — F0150 Vascular dementia without behavioral disturbance: Secondary | ICD-10-CM | POA: Diagnosis present

## 2020-09-26 DIAGNOSIS — M79661 Pain in right lower leg: Secondary | ICD-10-CM | POA: Diagnosis not present

## 2020-09-26 DIAGNOSIS — M7989 Other specified soft tissue disorders: Secondary | ICD-10-CM | POA: Diagnosis not present

## 2020-09-26 LAB — CBC WITH DIFFERENTIAL/PLATELET
Abs Immature Granulocytes: 0.01 10*3/uL (ref 0.00–0.07)
Basophils Absolute: 0 10*3/uL (ref 0.0–0.1)
Basophils Relative: 0 %
Eosinophils Absolute: 0.1 10*3/uL (ref 0.0–0.5)
Eosinophils Relative: 1 %
HCT: 30.3 % — ABNORMAL LOW (ref 39.0–52.0)
Hemoglobin: 9.9 g/dL — ABNORMAL LOW (ref 13.0–17.0)
Immature Granulocytes: 0 %
Lymphocytes Relative: 20 %
Lymphs Abs: 1.1 10*3/uL (ref 0.7–4.0)
MCH: 30.9 pg (ref 26.0–34.0)
MCHC: 32.7 g/dL (ref 30.0–36.0)
MCV: 94.7 fL (ref 80.0–100.0)
Monocytes Absolute: 0.3 10*3/uL (ref 0.1–1.0)
Monocytes Relative: 6 %
Neutro Abs: 3.7 10*3/uL (ref 1.7–7.7)
Neutrophils Relative %: 73 %
Platelets: 128 10*3/uL — ABNORMAL LOW (ref 150–400)
RBC: 3.2 MIL/uL — ABNORMAL LOW (ref 4.22–5.81)
RDW: 15.6 % — ABNORMAL HIGH (ref 11.5–15.5)
WBC: 5.2 10*3/uL (ref 4.0–10.5)
nRBC: 0 % (ref 0.0–0.2)

## 2020-09-26 LAB — COMPREHENSIVE METABOLIC PANEL
ALT: 24 U/L (ref 0–44)
AST: 44 U/L — ABNORMAL HIGH (ref 15–41)
Albumin: 3.4 g/dL — ABNORMAL LOW (ref 3.5–5.0)
Alkaline Phosphatase: 83 U/L (ref 38–126)
Anion gap: 11 (ref 5–15)
BUN: 97 mg/dL — ABNORMAL HIGH (ref 8–23)
CO2: 27 mmol/L (ref 22–32)
Calcium: 8.8 mg/dL — ABNORMAL LOW (ref 8.9–10.3)
Chloride: 97 mmol/L — ABNORMAL LOW (ref 98–111)
Creatinine, Ser: 3.36 mg/dL — ABNORMAL HIGH (ref 0.61–1.24)
GFR, Estimated: 16 mL/min — ABNORMAL LOW (ref >60)
Glucose, Bld: 119 mg/dL — ABNORMAL HIGH (ref 70–99)
Potassium: 3.1 mmol/L — ABNORMAL LOW (ref 3.5–5.1)
Sodium: 135 mmol/L (ref 135–145)
Total Bilirubin: 1.3 mg/dL — ABNORMAL HIGH (ref 0.3–1.2)
Total Protein: 6.1 g/dL — ABNORMAL LOW (ref 6.5–8.1)

## 2020-09-26 LAB — URINALYSIS, COMPLETE (UACMP) WITH MICROSCOPIC
Bacteria, UA: NONE SEEN
Bilirubin Urine: NEGATIVE
Glucose, UA: NEGATIVE mg/dL
Ketones, ur: NEGATIVE mg/dL
Leukocytes,Ua: NEGATIVE
Nitrite: NEGATIVE
Protein, ur: NEGATIVE mg/dL
Specific Gravity, Urine: 1.009 (ref 1.005–1.030)
pH: 7 (ref 5.0–8.0)

## 2020-09-26 MED ORDER — LACTATED RINGERS IV BOLUS
1000.0000 mL | Freq: Once | INTRAVENOUS | Status: AC
  Administered 2020-09-26: 1000 mL via INTRAVENOUS

## 2020-09-26 MED ORDER — ACETAMINOPHEN 500 MG PO TABS
1000.0000 mg | ORAL_TABLET | Freq: Once | ORAL | Status: AC
  Administered 2020-09-26: 1000 mg via ORAL
  Filled 2020-09-26: qty 2

## 2020-09-26 NOTE — ED Notes (Signed)
Patient transported to X-ray 

## 2020-09-26 NOTE — ED Notes (Signed)
ED Provider at bedside. 

## 2020-09-26 NOTE — ED Notes (Signed)
US at bedside at this time 

## 2020-09-26 NOTE — ED Provider Notes (Signed)
[facility] Emergency Department Provider Note ____________________________________________   Event Date/Time   First MD Initiated Contact with Patient 09/26/20 2104     (approximate)  I have reviewed the triage vital signs and the nursing notes.  HISTORY  Chief Complaint Leg Pain   HPI Stephen Garrett is a 87 y.o. Stephen Garrett presents to the ED for evaluation of generalized weakness and right leg pain.  Chart review indicates history of vascular dementia, anticoagulated on Eliquis  Patient presents via EMS from his SNF for evaluation of right leg pain and generalized weakness.  I received multiple different stories from patient and EMS, it sounds like he fell a few weeks ago and did not get evaluated for right leg pain, has been more weak and complaining of right leg pain in a subacute timeframe.  Patient is reporting right leg pain and thirst, history somewhat limited due to his disorientation.  Further history later obtained from friend who arrives at the bedside.  Friend resides at patient's SNF with him, but in the independent living section and used to play tennis with him regularly and has known the patient for years.  Friend reports that patient has been complaining of right leg pain for a few weeks now, but is seemed more weak for the past couple days.  Friend reports that patient was unable to even get up today and ambulate at his normal functional level.  Reports patient fell while playing tennis a few weeks ago, but nothing more recent than that.  Past Medical History:  Diagnosis Date   Actinic keratosis    BPH (benign prostatic hypertrophy)    Diseases of lips    Diverticulosis of colon 06/2003   ED (erectile dysfunction)    GERD (gastroesophageal reflux disease)    Heart murmur    as child   HLD (hyperlipidemia)    HOH (hard of hearing)    Cochlear implant right, hearing aid left   HTN (hypertension)    Numbness and tingling in both hands     intermittent, trying braces    Osteoarthritis of knee    severe, bilat   Renal insufficiency    Restless legs syndrome (RLS)    Sleep disturbance, unspecified    Spinal stenosis, lumbar region, without neurogenic claudication     Patient Active Problem List   Diagnosis Date Noted   Vascular dementia without behavioral disturbance ([place]) 08/13/2020   Stasis ulcer ([place]) 08/13/2020   AKI (acute kidney injury) ([place]) 02/02/2020   Chronic diastolic heart failure ([place]) 01/14/2020   Acute renal failure superimposed on stage 3 chronic kidney disease ([place]) 01/01/2020   A-fib ([place]) 01/01/2020   Diplopia 09/25/2017   TIA (transient ischemic attack) 09/14/2017   Retinal vein occlusion, central 02/19/2014   Chronic venous insufficiency 02/19/2014   Episodic mood disorder ([place]) 12/20/2013   Advanced directives, counseling/discussion 12/20/2013   Routine general medical examination at a health care facility 11/02/2012   Chronic kidney disease, stage IV (severe) ([place]) 01/26/2009   Restless legs syndrome 10/03/2008   OSTEOARTHRITIS 12/12/2006   SPINAL STENOSIS, LUMBAR 06/24/2006   Essential hypertension, benign 06/21/2006   GERD 06/21/2006   DIVERTICULOSIS, COLON 06/21/2006   BPH with obstruction/lower urinary tract symptoms 06/21/2006    Past Surgical History:  Procedure Laterality Date   CATARACT EXTRACTION, BILATERAL  2011   Dr. Thomasene Ripple   COCHLEAR IMPLANT Right 02/15/2017   Procedure: RIGHT COCHLEAR IMPLANT;  Surgeon: Vicie Mutters, MD;  Location: [facility];  Service: ENT;  Laterality: Right;   FINGER SURGERY     Right 4th finger   LIH  10/2009   Dr. Bary Castilla   LUMBAR SPINE SURGERY  06/2005   stenosis repair   PTOSIS REPAIR Bilateral 04/19/2019   Procedure: BLEPHAROPTOSIS REPAIR; RESECT SKIN;  Surgeon: Karle Starch, MD;  Location: [place];  Service: Ophthalmology;  Laterality: Bilateral;   TONSILLECTOMY  childhood   TOTAL KNEE ARTHROPLASTY  06/2008   bilat  (Dr. Marry Guan)    Prior to Admission medications   Medication Sig Start Date End Date Taking? Authorizing Provider  apixaban (ELIQUIS) 2.5 MG TABS tablet Take 1 tablet (2.5 mg total) by mouth 2 (two) times daily. 01/05/20   Dessa Phi, DO  buPROPion (WELLBUTRIN XL) 150 MG 24 hr tablet TAKE 1 TABLET BY MOUTH DAILY 03/02/20   [name], MD  clonazePAM (KLONOPIN) 0.5 MG tablet TAKE ONE TO TWO TABLETS AT BEDTIME Patient taking differently: Take 0.25 mg by mouth at bedtime. 03/02/20   [name], MD  finasteride (PROSCAR) 5 MG tablet TAKE ONE TABLET BY MOUTH EVERY DAY Patient taking differently: Take 5 mg by mouth daily. 08/05/19   [name], MD  furosemide (LASIX) 80 MG tablet Take 1 tablet (80 mg total) by mouth daily. If home weight is over 175#--take a second dose after lunch 03/11/20   [name], MD  losartan (COZAAR) 50 MG tablet Take 1 tablet (50 mg total) by mouth daily. 01/06/20 04/05/20  Kate Sable, MD  metolazone (ZAROXOLYN) 2.5 MG tablet Take 2.5 mg by mouth 2 (two) times a week.    [provider]  Multiple Vitamin (MULTIVITAMIN) capsule Take 1 capsule by mouth daily.    [provider]  omeprazole (PRILOSEC) 40 MG capsule TAKE 1 CAPSULE BY MOUTH ONCE DAILY 03/30/20   [name], MD  potassium chloride (KLOR-CON) 10 MEQ tablet TAKE ONE TABLET EVERY MORNING 03/02/20   [name], MD  pramipexole (MIRAPEX) 1 MG tablet TAKE ONE TABLET AT DINNER AND ONE TABLETAT BEDTIME Patient taking differently: Take 1 mg by mouth See admin instructions. Take 1 tablet (1mg ) by mouth at suppertime and take 1 tablet (1mg ) by mouth at bedtime 09/16/19   [name], MD  pyridoxine (B-6) 100 MG tablet Take 100 mg by mouth daily.    [provider]  tamsulosin (FLOMAX) 0.4 MG CAPS capsule TAKE 1 CAPSULE EVERY DAY Patient taking differently: Take 0.4 mg by mouth daily. 08/05/19   [name], MD  citalopram (CELEXA) 10 MG tablet Take 1  tablet (10 mg total) by mouth daily. 04/22/11 05/18/11  Viviana Simpler I, MD    Allergies Atorvastatin, Pravastatin sodium, and Statins  Family History  Problem Relation Age of Onset   Other Father        "clogged arteries"   Hypertension Mother     Social History Social History   Tobacco Use   Smoking status: Former    Packs/day: 0.75    Years: 30.00    Pack years: 22.50    Types: Cigarettes    Quit date: 02/28/1970    Years since quitting: 50.6   Smokeless tobacco: Never  Vaping Use   Vaping Use: Never used  Substance Use Topics   Alcohol use: Yes    Alcohol/week: 0.0 standard drinks    Comment: 1-2 glasses wine/daily   Drug use: No    Review of Systems  Unable to be accurately assessed due  to patient's disorientation  ____________________________________________   PHYSICAL EXAM:  VITAL SIGNS: Vitals:   09/26/20 2153 09/26/20 2300  BP: (!) 159/94 (!) 151/75  Pulse: (!) 58 70  Resp: 18 16  Temp:    SpO2: 98% 100%     Constitutional: Alert and pleasantly disoriented. Well appearing and in no acute distress.  Hard of hearing Eyes: Conjunctivae are normal. PERRL. EOMI. Head: Atraumatic. Nose: No congestion/rhinnorhea. Mouth/Throat: Mucous membranes are dry.  Oropharynx non-erythematous. Neck: No stridor. No cervical spine tenderness to palpation. Cardiovascular: Normal rate, regular rhythm. Grossly normal heart sounds.  Good peripheral circulation. Respiratory: Normal respiratory effort.  No retractions. Lungs CTAB. Gastrointestinal: Soft , nondistended, nontender to palpation. No CVA tenderness. Musculoskeletal: .  No joint effusions. No signs of acute trauma. No pain with logrolling of either extremities. Does have some mild tenderness to the medial right thigh without overlying skin changes or palpable cord.  No signs of trauma. Bilateral legs are symmetric without focal swelling or signs of trauma to the right. Neurologic:  Normal speech and language.  No gross focal neurologic deficits are appreciated. No gait instability noted. Skin:  Skin is warm, dry and intact. No rash noted. Psychiatric: Mood and affect are normal. Speech and behavior are normal.  ____________________________________________   LABS (all labs ordered are listed, but only abnormal results are displayed)  Labs Reviewed  URINALYSIS, COMPLETE (UACMP) WITH MICROSCOPIC - Abnormal; Notable for the following components:      Result Value   Color, Urine YELLOW (*)    APPearance HAZY (*)    Hgb urine dipstick SMALL (*)    All other components within normal limits  COMPREHENSIVE METABOLIC PANEL - Abnormal; Notable for the following components:   Potassium 3.1 (*)    Chloride 97 (*)    Glucose, Bld 119 (*)    BUN 97 (*)    Creatinine, Ser 3.36 (*)    Calcium 8.8 (*)    Total Protein 6.1 (*)    Albumin 3.4 (*)    AST 44 (*)    Total Bilirubin 1.3 (*)    GFR, Estimated 16 (*)    All other components within normal limits  CBC WITH DIFFERENTIAL/PLATELET - Abnormal; Notable for the following components:   RBC 3.20 (*)    Hemoglobin 9.9 (*)    HCT 30.3 (*)    RDW 15.6 (*)    Platelets 128 (*)    All other components within normal limits   ____________________________________________  12 Lead EKG  Atrial fibrillation, rate of 60 bpm.  Normal axis and intervals.  No evidence of acute ischemia.  Low voltage. ____________________________________________  RADIOLOGY  ED MD interpretation: Plain films of the right knee, right hip and pelvis reviewed by me without evidence of acute fracture or dislocation.  Official radiology report(s): DG Chest Portable 1 View  Result Date: 09/26/2020 CLINICAL DATA:  Right hip and leg EXAM: PORTABLE CHEST 1 VIEW COMPARISON:  02/02/2020 FINDINGS: Stable cardiomegaly. Unchanged mediastinal contours with aortic atherosclerosis. No focal airspace disease. No pleural effusion or pneumothorax. No pulmonary edema. No acute osseous  abnormalities are seen. Chronic degenerative change of the first rib at the chondral junction. IMPRESSION: Stable cardiomegaly. No acute abnormality. Electronically Signed   By: Keith Rake M.D.   On: 09/26/2020 22:13   DG Knee Complete 4 Views Right  Result Date: 09/26/2020 CLINICAL DATA:  Right leg pain.  Assess for fracture. EXAM: RIGHT KNEE - COMPLETE 4+ VIEW COMPARISON:  None. FINDINGS: Right knee  arthroplasty in expected alignment. No periprosthetic fracture or lucency. There has been patellar resurfacing. Trace joint effusion. No erosion or focal bone lesion. Mild medial soft tissue edema. IMPRESSION: 1. No acute fracture. 2. Right knee arthroplasty without complication. 3. Mild medial soft tissue edema. Electronically Signed   By: Keith Rake M.D.   On: 09/26/2020 22:12   DG HIP UNILAT WITH PELVIS 2-3 VIEWS RIGHT  Result Date: 09/26/2020 CLINICAL DATA:  Right hip pain. EXAM: DG HIP (WITH OR WITHOUT PELVIS) 2-3V RIGHT COMPARISON:  Radiograph 01/13/2016 FINDINGS: No fracture. Similar right hip joint space narrowing with lateral and inferior acetabular spurring. Femoral head is well seated. No evidence of a vascular necrosis or focal bone lesion. No bone destruction. Pubic symphysis and sacroiliac joints are congruent with mild degenerative change. Mild enthesopathic changes noted about the greater trochanter. Suspected labral calcifications. IMPRESSION: Moderate right hip osteoarthritis without acute osseous abnormality. Electronically Signed   By: Keith Rake M.D.   On: 09/26/2020 22:11    ____________________________________________   PROCEDURES and INTERVENTIONS  Procedure(s) performed (including Critical Care):  .1-3 Lead EKG Interpretation  Date/Time: 09/26/2020 11:31 PM Performed by: Vladimir Crofts, MD Authorized by: Vladimir Crofts, MD     Interpretation: normal     ECG rate:  70   ECG rate assessment: normal     Rhythm: sinus rhythm     Ectopy: none     Conduction:  normal    Medications  acetaminophen (TYLENOL) tablet 1,000 mg (1,000 mg Oral Given 09/26/20 2144)  lactated ringers bolus 1,000 mL (0 mLs Intravenous Stopped 09/26/20 2311)    ____________________________________________   MDM / ED COURSE   87 year old male presents to the ED from a local SNF for evaluation of right leg pain, with evidence of AKI.  No signs of acute trauma on examination, no clinical evidence of hip fracture.  He has no neurologic vascular deficits.  No asymmetry or swelling to the right leg, but does have tenderness to the right medial thigh without overlying skin changes or palpable cord.  Blood work with AKI and hypokalemia, for which she received lactated Ringer's.  Proving pain with Tylenol.  We will assess for DVT with venous ultrasound.  Plan for medical admission thereafter due to his AKI.      ____________________________________________   FINAL CLINICAL IMPRESSION(S) / ED DIAGNOSES  Final diagnoses:  Right hip pain     ED Discharge Orders     None        Ved Martos   Note:  This document was prepared using Dragon voice recognition software and may include unintentional dictation errors.    Vladimir Crofts, MD 09/26/20 [phone]

## 2020-09-26 NOTE — ED Notes (Signed)
Assisted pt with urinal at this time.  

## 2020-09-26 NOTE — ED Triage Notes (Addendum)
Pt presents to the ER from [facility] with complaints of right leg pain. Per EMS, staff notes staff typically plays tennis with his buddies, however he was non ambulatory, pt received medications prior to arrival to the ER.Marland Kitchen Unsure of falls.

## 2020-09-26 NOTE — ED Provider Notes (Signed)
  Physical Exam  BP (!) 151/75   Pulse 70   Temp 98.1 F (36.7 C) (Oral)   Resp 16   Ht 5\' 7"  (1.702 m)   Wt 77.6 kg   SpO2 100%   BMI 26.79 kg/m   Physical Exam  ED Course/Procedures     Procedures  MDM  11:45 PM  Assumed care at shift change.  Patient is a 87 year old male who presents to the ED from a nursing facility for right lower extremity pain, generalized weakness.  Ultrasound currently pending.  Labs show that patient has worsening renal function.  Will need admission per previous provider.  12:22 AM Discussed patient's case with hospitalist, Dr. Damita Dunnings.  I have recommended admission and patient (and family if present) agree with this plan. Admitting physician will place admission orders.   I reviewed all nursing notes, vitals, pertinent previous records and reviewed/interpreted all EKGs, lab and urine results, imaging (as available).        Savyon Loken, Delice Bison, DO 09/27/20 0022

## 2020-09-27 ENCOUNTER — Other Ambulatory Visit: Payer: Self-pay

## 2020-09-27 DIAGNOSIS — Z66 Do not resuscitate: Secondary | ICD-10-CM | POA: Diagnosis present

## 2020-09-27 DIAGNOSIS — F32A Depression, unspecified: Secondary | ICD-10-CM | POA: Diagnosis present

## 2020-09-27 DIAGNOSIS — R5381 Other malaise: Secondary | ICD-10-CM | POA: Diagnosis present

## 2020-09-27 DIAGNOSIS — F0150 Vascular dementia without behavioral disturbance: Secondary | ICD-10-CM | POA: Diagnosis present

## 2020-09-27 DIAGNOSIS — I5032 Chronic diastolic (congestive) heart failure: Secondary | ICD-10-CM | POA: Diagnosis present

## 2020-09-27 DIAGNOSIS — E876 Hypokalemia: Secondary | ICD-10-CM | POA: Diagnosis present

## 2020-09-27 DIAGNOSIS — R531 Weakness: Secondary | ICD-10-CM

## 2020-09-27 DIAGNOSIS — Z96651 Presence of right artificial knee joint: Secondary | ICD-10-CM | POA: Diagnosis present

## 2020-09-27 DIAGNOSIS — N184 Chronic kidney disease, stage 4 (severe): Secondary | ICD-10-CM | POA: Diagnosis present

## 2020-09-27 DIAGNOSIS — Z7901 Long term (current) use of anticoagulants: Secondary | ICD-10-CM | POA: Diagnosis not present

## 2020-09-27 DIAGNOSIS — N179 Acute kidney failure, unspecified: Secondary | ICD-10-CM | POA: Diagnosis present

## 2020-09-27 DIAGNOSIS — R600 Localized edema: Secondary | ICD-10-CM

## 2020-09-27 DIAGNOSIS — F419 Anxiety disorder, unspecified: Secondary | ICD-10-CM | POA: Diagnosis present

## 2020-09-27 DIAGNOSIS — D649 Anemia, unspecified: Secondary | ICD-10-CM

## 2020-09-27 DIAGNOSIS — Z20822 Contact with and (suspected) exposure to covid-19: Secondary | ICD-10-CM | POA: Diagnosis present

## 2020-09-27 DIAGNOSIS — I482 Chronic atrial fibrillation, unspecified: Secondary | ICD-10-CM | POA: Diagnosis present

## 2020-09-27 DIAGNOSIS — I130 Hypertensive heart and chronic kidney disease with heart failure and stage 1 through stage 4 chronic kidney disease, or unspecified chronic kidney disease: Secondary | ICD-10-CM | POA: Diagnosis present

## 2020-09-27 DIAGNOSIS — N400 Enlarged prostate without lower urinary tract symptoms: Secondary | ICD-10-CM | POA: Diagnosis present

## 2020-09-27 DIAGNOSIS — M79604 Pain in right leg: Secondary | ICD-10-CM

## 2020-09-27 LAB — CBC
HCT: 31 % — ABNORMAL LOW (ref 39.0–52.0)
Hemoglobin: 10.1 g/dL — ABNORMAL LOW (ref 13.0–17.0)
MCH: 30.8 pg (ref 26.0–34.0)
MCHC: 32.6 g/dL (ref 30.0–36.0)
MCV: 94.5 fL (ref 80.0–100.0)
Platelets: 121 10*3/uL — ABNORMAL LOW (ref 150–400)
RBC: 3.28 MIL/uL — ABNORMAL LOW (ref 4.22–5.81)
RDW: 15.4 % (ref 11.5–15.5)
WBC: 4.9 10*3/uL (ref 4.0–10.5)
nRBC: 0 % (ref 0.0–0.2)

## 2020-09-27 LAB — IRON AND TIBC
Iron: 43 ug/dL — ABNORMAL LOW (ref 45–182)
Saturation Ratios: 15 % — ABNORMAL LOW (ref 17.9–39.5)
TIBC: 280 ug/dL (ref 250–450)
UIBC: 237 ug/dL

## 2020-09-27 LAB — RESP PANEL BY RT-PCR (FLU A&B, COVID) ARPGX2
Influenza A by PCR: NEGATIVE
Influenza B by PCR: NEGATIVE
SARS Coronavirus 2 by RT PCR: NEGATIVE

## 2020-09-27 LAB — BASIC METABOLIC PANEL
Anion gap: 9 (ref 5–15)
BUN: 94 mg/dL — ABNORMAL HIGH (ref 8–23)
CO2: 31 mmol/L (ref 22–32)
Calcium: 9.1 mg/dL (ref 8.9–10.3)
Chloride: 97 mmol/L — ABNORMAL LOW (ref 98–111)
Creatinine, Ser: 3.13 mg/dL — ABNORMAL HIGH (ref 0.61–1.24)
GFR, Estimated: 18 mL/min — ABNORMAL LOW (ref >60)
Glucose, Bld: 105 mg/dL — ABNORMAL HIGH (ref 70–99)
Potassium: 2.8 mmol/L — ABNORMAL LOW (ref 3.5–5.1)
Sodium: 137 mmol/L (ref 135–145)

## 2020-09-27 LAB — VITAMIN B12: Vitamin B-12: 861 pg/mL (ref 180–914)

## 2020-09-27 LAB — FOLATE: Folate: 54 ng/mL (ref >5.9)

## 2020-09-27 LAB — MAGNESIUM: Magnesium: 2 mg/dL (ref 1.7–2.4)

## 2020-09-27 LAB — FERRITIN: Ferritin: 179 ng/mL (ref 24–336)

## 2020-09-27 MED ORDER — PRAMIPEXOLE DIHYDROCHLORIDE 1 MG PO TABS
1.0000 mg | ORAL_TABLET | Freq: Two times a day (BID) | ORAL | Status: DC
  Administered 2020-09-27: 1 mg via ORAL
  Filled 2020-09-27 (×3): qty 1

## 2020-09-27 MED ORDER — ONDANSETRON HCL 4 MG PO TABS: 4.0000 mg | ORAL_TABLET | Freq: Four times a day (QID) | ORAL | Status: DC | PRN

## 2020-09-27 MED ORDER — APIXABAN 2.5 MG PO TABS
2.5000 mg | ORAL_TABLET | Freq: Two times a day (BID) | ORAL | Status: DC
  Administered 2020-09-27 – 2020-09-28 (×3): 2.5 mg via ORAL
  Filled 2020-09-27 (×4): qty 1

## 2020-09-27 MED ORDER — POTASSIUM CHLORIDE CRYS ER 20 MEQ PO TBCR
40.0000 meq | EXTENDED_RELEASE_TABLET | Freq: Three times a day (TID) | ORAL | Status: AC
  Administered 2020-09-27 – 2020-09-28 (×4): 40 meq via ORAL
  Filled 2020-09-27 (×4): qty 2

## 2020-09-27 MED ORDER — CLONAZEPAM 0.5 MG PO TABS
0.2500 mg | ORAL_TABLET | Freq: Every day | ORAL | Status: DC
  Administered 2020-09-27: 0.25 mg via ORAL
  Filled 2020-09-27: qty 1

## 2020-09-27 MED ORDER — ACETAMINOPHEN 650 MG RE SUPP: 650.0000 mg | Freq: Four times a day (QID) | RECTAL | Status: DC | PRN

## 2020-09-27 MED ORDER — PANTOPRAZOLE SODIUM 40 MG PO TBEC
40.0000 mg | DELAYED_RELEASE_TABLET | Freq: Every day | ORAL | Status: DC
Start: 2020-09-27 — End: 2020-09-28
  Administered 2020-09-27 – 2020-09-28 (×2): 40 mg via ORAL
  Filled 2020-09-27 (×2): qty 1

## 2020-09-27 MED ORDER — ADULT MULTIVITAMIN W/MINERALS CH
1.0000 | ORAL_TABLET | Freq: Every day | ORAL | Status: DC
  Administered 2020-09-27 – 2020-09-28 (×2): 1 via ORAL
  Filled 2020-09-27 (×2): qty 1

## 2020-09-27 MED ORDER — ZOLPIDEM TARTRATE 5 MG PO TABS
5.0000 mg | ORAL_TABLET | Freq: Every evening | ORAL | Status: DC | PRN
  Administered 2020-09-27: 5 mg via ORAL
  Filled 2020-09-27: qty 1

## 2020-09-27 MED ORDER — BUPROPION HCL ER (XL) 150 MG PO TB24
150.0000 mg | ORAL_TABLET | Freq: Every day | ORAL | Status: DC
  Administered 2020-09-27 – 2020-09-28 (×2): 150 mg via ORAL
  Filled 2020-09-27 (×2): qty 1

## 2020-09-27 MED ORDER — FINASTERIDE 5 MG PO TABS
5.0000 mg | ORAL_TABLET | Freq: Every day | ORAL | Status: DC
  Administered 2020-09-27 – 2020-09-28 (×2): 5 mg via ORAL
  Filled 2020-09-27 (×2): qty 1

## 2020-09-27 MED ORDER — VITAMIN B-6 50 MG PO TABS
100.0000 mg | ORAL_TABLET | Freq: Every day | ORAL | Status: DC
  Administered 2020-09-27 – 2020-09-28 (×2): 100 mg via ORAL
  Filled 2020-09-27 (×2): qty 2

## 2020-09-27 MED ORDER — SODIUM CHLORIDE 0.9 % IV SOLN: INTRAVENOUS | Status: AC

## 2020-09-27 MED ORDER — ACETAMINOPHEN 325 MG PO TABS: 650.0000 mg | ORAL_TABLET | Freq: Four times a day (QID) | ORAL | Status: DC | PRN

## 2020-09-27 MED ORDER — HYDROCODONE-ACETAMINOPHEN 5-325 MG PO TABS
1.0000 | ORAL_TABLET | ORAL | Status: DC | PRN
  Administered 2020-09-28: 1 via ORAL
  Filled 2020-09-27: qty 1

## 2020-09-27 MED ORDER — ONDANSETRON HCL 4 MG/2ML IJ SOLN
4.0000 mg | Freq: Four times a day (QID) | INTRAMUSCULAR | Status: DC | PRN
  Filled 2020-09-27: qty 2

## 2020-09-27 MED ORDER — TAMSULOSIN HCL 0.4 MG PO CAPS
0.4000 mg | ORAL_CAPSULE | Freq: Every day | ORAL | Status: DC
  Administered 2020-09-27 – 2020-09-28 (×2): 0.4 mg via ORAL
  Filled 2020-09-27 (×2): qty 1

## 2020-09-27 NOTE — Progress Notes (Signed)
Patient seen and examined.  Admitted by nighttime hospitalist early morning hours.  I reviewed.  Discussed with patient's daughter.  In brief, 87 year old gentleman with history of chronic A. fib on Eliquis, hypertension, CKD stage IV, chronic lower extremity venous stasis who was sent to emergency department with generalized weakness, difficulty get out of the bed, right groin pain from an assisted living facility.  Patient lives in [facility] assisted living facility, walks with a walker but recently having difficulties with walking.  On arrival to ER blood pressure stable.  98% on room air.  Creatinine 3.36 with known baseline creatinine of 2.4.  Hemoglobin 9.9.  Chest x-ray with no acute abnormality.  Skeletal survey including right hip and pelvis without any acute findings.  Lower extremity venous duplex is without DVT.  Treated with IV fluids and admitted for symptomatic treatment.  Potassium was very low.  Physical debility/multiple electrolyte abnormalities including hypokalemia, acute renal failure: Symptomatic treatment.  Low-dose IV fluids.  Aggressive replacement of potassium. Work with PT OT to evaluate and recommend next best level of care.  Needs to stay in the hospital today to undergo further evaluation and monitor response to treatment.

## 2020-09-27 NOTE — Plan of Care (Signed)

## 2020-09-27 NOTE — Evaluation (Signed)
Occupational Therapy Evaluation Patient Details Name: Stephen Garrett MRN: [id number] DOB: 10/16/1923 Today's Date: 09/27/2020    History of Present Illness Pt is a 87 y/o M admitted on 09/26/20 with c/c of generalized weakness & R leg pain. Pt being treated for generalized weakness suspect to be multifactoral & related to dehydration given AKI, anemia, and hypokalemia. PMH: a-fib on eliquis, HTN, hearing impairment, CKD4, chronic lower extremity venous stasis, heart murmur, HOH (L hearing aide, R cochlear implant), RLS, lumbar spinal stenosis   Clinical Impression   Pt seen for OT evaluation this date in setting of acute hospitalization d/t weakness 2/2 dehydration. Pt reports being MOD I for fxl mobility at baseline, having facility assistance for IADLs, and completing ADLs I'ly or with MOD I. Pt presents this date with some decreased fxl activity tolerance, decreased strength, and general deconditioning. On OT assessment of ADLs, pt requires: SETUP for seated UB ADLs, MIN/MOD A for seated LB ADLs. CGA for ADL transfers, SUPV/CGA for fxl mobility with RW. Pt will require continued OT services acutely and recommend f/u HHOT in ALF setting upon d/c. Pt left in chair with all needs met and in reach at end of session. Chair alarm activated.     Follow Up Recommendations  Home health OT    Equipment Recommendations  Tub/shower seat    Recommendations for Other Services       Precautions / Restrictions Precautions Precautions: Fall Restrictions Weight Bearing Restrictions: No      Mobility Bed Mobility Overal bed mobility: Needs Assistance Bed Mobility: Supine to Sit     Supine to sit: Min assist;HOB elevated Sit to supine: Supervision        Transfers Overall transfer level: Needs assistance Equipment used: Rolling walker (2 wheeled) Transfers: Sit to/from Stand Sit to Stand: Supervision;Min guard              Balance Overall balance assessment: Needs  assistance Sitting-balance support: No upper extremity supported;Feet supported Sitting balance-Leahy Scale: Fair     Standing balance support: Bilateral upper extremity supported Standing balance-Leahy Scale: Poor Standing balance comment: CGA with UE support on RW for sustained balance with fxl mobility                           ADL either performed or assessed with clinical judgement   ADL Overall ADL's : Needs assistance/impaired                                       General ADL Comments: SETUP for seated UB ADLs, MIN/MOD A for seated LB ADLs. CGA for ADL transfers, SUPV/CGA for fxl mobility with RW.     Vision Patient Visual Report: No change from baseline       Perception     Praxis      Pertinent Vitals/Pain Pain Assessment: No/denies pain     Hand Dominance Left   Extremity/Trunk Assessment Upper Extremity Assessment Upper Extremity Assessment: Overall WFL for tasks assessed   Lower Extremity Assessment Lower Extremity Assessment: Generalized weakness   Cervical / Trunk Assessment Cervical / Trunk Assessment: Kyphotic   Communication Communication Communication: HOH (L hearing aide, R cochlear implant, used written communication (large letters) to assist as needed during session)   Cognition Arousal/Alertness: Awake/alert Behavior During Therapy: WFL for tasks assessed/performed Overall Cognitive Status: Difficult to assess                                       General Comments       Exercises Other Exercises Other Exercises: OT ed re: role of OT, safety considerations, fall prevention. Pt wtih good understanding Other Exercises: OT engaged pt in chair push ups for 2 sets of 7 reps. Pt tolerates well.   Shoulder Instructions      Home Living Family/patient expects to be discharged to:: Assisted living [facility])                             Home Equipment: Kasandra Knudsen - single point   Additional  Comments: [facility] (pt reports for 20 years)      Prior Functioning/Environment Level of Independence: Independent        Comments: ILF, drives, runs errands, had been playing tennis a few months ago        OT Problem List: Decreased strength;Decreased activity tolerance;Impaired balance (sitting and/or standing)      OT Treatment/Interventions: Self-care/ADL training;DME and/or AE instruction;Therapeutic activities;Therapeutic exercise;Energy conservation;Patient/family education    OT Goals(Current goals can be found in the care plan section) Acute Rehab OT Goals Patient Stated Goal: to go back to ALF OT Goal Formulation: With patient Time For Goal Achievement: 10/11/20 Potential to Achieve Goals: Good ADL Goals Pt Will Perform Grooming: with supervision;standing (sink-side with LRAD to complete 2-3 g/h tasks) Pt Will Perform Lower Body Dressing: with supervision;sit to/from stand (with AE PRN) Pt Will Transfer to Toilet: with supervision;ambulating;grab bars (with LRAD to/from restroom) Pt Will Perform Toileting - Clothing Manipulation and hygiene: with supervision;sit to/from stand Pt/caregiver will Perform Home Exercise Program: Increased strength;Both right and left upper extremity;With Supervision (and core)  OT Frequency: Min 1X/week   Barriers to D/C:            Co-evaluation              AM-PAC OT "6 Clicks" Daily Activity     Outcome Measure Help from another person eating meals?: None Help from another person taking care of personal grooming?: A Little Help from another person toileting, which includes using toliet, bedpan, or urinal?: A Lot Help from another person bathing (including washing, rinsing, drying)?: A Little Help from another person to put on and taking off regular upper body clothing?: None Help from another person to put on and taking off regular lower body clothing?: A Lot 6 Click Score: 18   End of Session Equipment Utilized During  Treatment: Gait belt;Rolling walker Nurse Communication: Mobility status  Activity Tolerance: Patient tolerated treatment well Patient left: with call bell/phone within reach;in chair;with chair alarm set  OT Visit Diagnosis: Unsteadiness on feet (R26.81);Muscle weakness (generalized) (M62.81)                Time: 6213-0865 OT Time Calculation (min): 61 min Charges:  OT General Charges $OT Visit: 1 Visit OT Evaluation $OT Eval Moderate Complexity: 1 Mod OT Treatments $Self Care/Home Management : 23-37 mins $Therapeutic Activity: 8-22 mins $Therapeutic Exercise: 8-22 mins  Gerrianne Scale, MS, OTR/L ascom [phone] 09/27/20, 4:55 PM

## 2020-09-27 NOTE — H&P (Addendum)
History and Physical    Stephen Garrett MEQ:[id number] DOB: [birth year] DOA: 09/26/2020  PCP: [name], MD   Patient coming from: SNF  I have personally briefly reviewed patient's old medical records in [place]  Chief Complaint: Generalized weakness, right leg pain  HPI: Stephen Garrett is a 87 y.o. male with medical history significant for A. fib on Eliquis, HTN, hearing impairment, CKD 4, chronic lower extremity venous stasis, who was sent to the ED for evaluation of a 1 day history of generalized weakness, finding it difficult to get out of bed.  He also has  right leg pain.  Patient states that at baseline he is able to ambulate on his own with assistance of his walker.  He denied chest pain or shortness of breath, nausea vomiting or abdominal pain or diarrhea.  ED course: On arrival, afebrile, BP 159/94, pulse 58 with O2 sat 98% on room air Blood work significant for creatinine 3.36 up from baseline of 2.4, with a BUN of 97.  Hemoglobin 9.9, down from baseline of 12.1.  And platelets 128,000.  Urinalysis unremarkable  EKG, personally viewed and interpreted: A. fib with rate of 60 bpm  Imaging: Chest x-ray with no acute abnormality Right hip and pelvis without acute osseous abnormality Venous Doppler right leg without DVT  Patient given an IV fluid bolus.  Hospitalist consulted for admission.  Review of Systems: As per HPI otherwise all other systems on review of systems negative.    Past Medical History:  Diagnosis Date   Actinic keratosis    BPH (benign prostatic hypertrophy)    Diseases of lips    Diverticulosis of colon 06/2003   ED (erectile dysfunction)    GERD (gastroesophageal reflux disease)    Heart murmur    as child   HLD (hyperlipidemia)    HOH (hard of hearing)    Cochlear implant right, hearing aid left   HTN (hypertension)    Numbness and tingling in both hands    intermittent, trying braces    Osteoarthritis of knee    severe, bilat    Renal insufficiency    Restless legs syndrome (RLS)    Sleep disturbance, unspecified    Spinal stenosis, lumbar region, without neurogenic claudication     Past Surgical History:  Procedure Laterality Date   CATARACT EXTRACTION, BILATERAL  2011   Dr. Thomasene Ripple   COCHLEAR IMPLANT Right 02/15/2017   Procedure: RIGHT COCHLEAR IMPLANT;  Surgeon: Vicie Mutters, MD;  Location: [facility];  Service: ENT;  Laterality: Right;   FINGER SURGERY     Right 4th finger   LIH  10/2009   Dr. Bary Castilla   LUMBAR SPINE SURGERY  06/2005   stenosis repair   PTOSIS REPAIR Bilateral 04/19/2019   Procedure: BLEPHAROPTOSIS REPAIR; RESECT SKIN;  Surgeon: Karle Starch, MD;  Location: [facility];  Service: Ophthalmology;  Laterality: Bilateral;   TONSILLECTOMY  childhood   TOTAL KNEE ARTHROPLASTY  06/2008   bilat (Dr. Marry Guan)     reports that he quit smoking about 50 years ago. His smoking use included cigarettes. He has a 22.50 pack-year smoking history. He has never used smokeless tobacco. He reports current alcohol use. He reports that he does not use drugs.  Allergies  Allergen Reactions   Atorvastatin     REACTION: raises blood pressure   Pravastatin Sodium     REACTION: raises blood pressure   Statins     REACTION: raises bp  Family History  Problem Relation Age of Onset   Other Father        "clogged arteries"   Hypertension Mother       Prior to Admission medications   Medication Sig Start Date End Date Taking? Authorizing Provider  apixaban (ELIQUIS) 2.5 MG TABS tablet Take 1 tablet (2.5 mg total) by mouth 2 (two) times daily. 01/05/20   Dessa Phi, DO  buPROPion (WELLBUTRIN XL) 150 MG 24 hr tablet TAKE 1 TABLET BY MOUTH DAILY 03/02/20   [name], MD  clonazePAM (KLONOPIN) 0.5 MG tablet TAKE ONE TO TWO TABLETS AT BEDTIME Patient taking differently: Take 0.25 mg by mouth at bedtime. 03/02/20   [name], MD  finasteride (PROSCAR) 5 MG tablet TAKE ONE  TABLET BY MOUTH EVERY DAY Patient taking differently: Take 5 mg by mouth daily. 08/05/19   [name], MD  furosemide (LASIX) 80 MG tablet Take 1 tablet (80 mg total) by mouth daily. If home weight is over 175#--take a second dose after lunch 03/11/20   [name], MD  losartan (COZAAR) 50 MG tablet Take 1 tablet (50 mg total) by mouth daily. 01/06/20 04/05/20  Kate Sable, MD  metolazone (ZAROXOLYN) 2.5 MG tablet Take 2.5 mg by mouth 2 (two) times a week.    [provider]  Multiple Vitamin (MULTIVITAMIN) capsule Take 1 capsule by mouth daily.    [provider]  omeprazole (PRILOSEC) 40 MG capsule TAKE 1 CAPSULE BY MOUTH ONCE DAILY 03/30/20   [name], MD  potassium chloride (KLOR-CON) 10 MEQ tablet TAKE ONE TABLET EVERY MORNING 03/02/20   [name], MD  pramipexole (MIRAPEX) 1 MG tablet TAKE ONE TABLET AT DINNER AND ONE TABLETAT BEDTIME Patient taking differently: Take 1 mg by mouth See admin instructions. Take 1 tablet (1mg ) by mouth at suppertime and take 1 tablet (1mg ) by mouth at bedtime 09/16/19   [name], MD  pyridoxine (B-6) 100 MG tablet Take 100 mg by mouth daily.    [provider]  tamsulosin (FLOMAX) 0.4 MG CAPS capsule TAKE 1 CAPSULE EVERY DAY Patient taking differently: Take 0.4 mg by mouth daily. 08/05/19   [name], MD  citalopram (CELEXA) 10 MG tablet Take 1 tablet (10 mg total) by mouth daily. 04/22/11 05/18/11  [name], MD    Physical Exam: Vitals:   09/26/20 2104 09/26/20 2153 09/26/20 2300  BP: (!) 143/73 (!) 159/94 (!) 151/75  Pulse: (!) 54 (!) 58 70  Resp: 20 18 16   Temp: 98.1 F (36.7 C)    TempSrc: Oral    SpO2: 99% 98% 100%  Weight: 77.6 kg    Height: 5\' 7"  (1.702 m)       Vitals:   09/26/20 2104 09/26/20 2153 09/26/20 2300  BP: (!) 143/73 (!) 159/94 (!) 151/75  Pulse: (!) 54 (!) 58 70  Resp: 20 18 16   Temp: 98.1 F (36.7 C)    TempSrc: Oral    SpO2: 99% 98% 100%   Weight: 77.6 kg    Height: 5\' 7"  (1.702 m)        Constitutional: Alert and oriented x 3 . Not in any apparent distress HEENT:      Head: Normocephalic and atraumatic.         Eyes: PERLA, EOMI, Conjunctivae are normal. Sclera is non-icteric.       Mouth/Throat: Mucous membranes are moist.       Neck: Supple with no  signs of meningismus. Cardiovascular: Regular rate and rhythm. No murmurs, gallops, or rubs. 2+ symmetrical distal pulses are present . No JVD. No LE edema Respiratory: Respiratory effort normal .Lungs sounds clear bilaterally. No wheezes, crackles, or rhonchi.  Gastrointestinal: Soft, non tender, and non distended with positive bowel sounds.  Genitourinary: No CVA tenderness. Musculoskeletal: Nontender with normal range of motion in all extremities. No cyanosis, or erythema of extremities. Neurologic:  Face is symmetric. Moving all extremities. No gross focal neurologic deficits . Skin: Skin is warm, dry.  No rash or ulcers Psychiatric: Mood and affect are normal    Labs on Admission: I have personally reviewed following labs and imaging studies  CBC: Recent Labs  Lab 09/26/20 2109  WBC 5.2  NEUTROABS 3.7  HGB 9.9*  HCT 30.3*  MCV 94.7  PLT 829*   Basic Metabolic Panel: Recent Labs  Lab 09/26/20 2109  NA 135  K 3.1*  CL 97*  CO2 27  GLUCOSE 119*  BUN 97*  CREATININE 3.36*  CALCIUM 8.8*   GFR: Estimated Creatinine Clearance: 12.3 mL/min (A) (by C-G formula based on SCr of 3.36 mg/dL (H)). Liver Function Tests: Recent Labs  Lab 09/26/20 2109  AST 44*  ALT 24  ALKPHOS 83  BILITOT 1.3*  PROT 6.1*  ALBUMIN 3.4*   No results for input(s): LIPASE, AMYLASE in the last 168 hours. No results for input(s): AMMONIA in the last 168 hours. Coagulation Profile: No results for input(s): INR, PROTIME in the last 168 hours. Cardiac Enzymes: No results for input(s): CKTOTAL, CKMB, CKMBINDEX, TROPONINI in the last 168 hours. BNP (last 3 results) No  results for input(s): PROBNP in the last 8760 hours. HbA1C: No results for input(s): HGBA1C in the last 72 hours. CBG: No results for input(s): GLUCAP in the last 168 hours. Lipid Profile: No results for input(s): CHOL, HDL, LDLCALC, TRIG, CHOLHDL, LDLDIRECT in the last 72 hours. Thyroid Function Tests: No results for input(s): TSH, T4TOTAL, FREET4, T3FREE, THYROIDAB in the last 72 hours. Anemia Panel: No results for input(s): VITAMINB12, FOLATE, FERRITIN, TIBC, IRON, RETICCTPCT in the last 72 hours. Urine analysis:    Component Value Date/Time   COLORURINE YELLOW (A) 09/26/2020 2153   APPEARANCEUR HAZY (A) 09/26/2020 2153   LABSPEC 1.009 09/26/2020 2153   PHURINE 7.0 09/26/2020 2153   GLUCOSEU NEGATIVE 09/26/2020 2153   HGBUR SMALL (A) 09/26/2020 2153   HGBUR large 09/03/2008 0933   BILIRUBINUR NEGATIVE 09/26/2020 2153   BILIRUBINUR Negative 08/04/2017 1446   KETONESUR NEGATIVE 09/26/2020 2153   PROTEINUR NEGATIVE 09/26/2020 2153   UROBILINOGEN 0.2 08/04/2017 1446   UROBILINOGEN 0.2 09/03/2008 0933   NITRITE NEGATIVE 09/26/2020 2153   LEUKOCYTESUR NEGATIVE 09/26/2020 2153    Radiological Exams on Admission: US Venous Img Lower Unilateral Right  Result Date: 09/26/2020 CLINICAL DATA:  Right lower extremity pain and swelling EXAM: RIGHT LOWER EXTREMITY VENOUS DOPPLER ULTRASOUND TECHNIQUE: Gray-scale sonography with compression, as well as color and duplex ultrasound, were performed to evaluate the deep venous system(s) from the level of the common femoral vein through the popliteal and proximal calf veins. COMPARISON:  None. FINDINGS: VENOUS Normal compressibility of the common femoral, superficial femoral, and popliteal veins, as well as the visualized calf veins. Visualized portions of profunda femoral vein and great saphenous vein unremarkable. No filling defects to suggest DVT on grayscale or color Doppler imaging. Doppler waveforms show normal direction of venous flow, normal  respiratory plasticity and response to augmentation. Arterialized waveform likely represents artifact from the  adjacent arterial vasculature, best appreciated within the a distal femoral vein. Limited views of the contralateral common femoral vein are unremarkable. OTHER None. Limitations: none IMPRESSION: Negative. Electronically Signed   By: Fidela Salisbury MD   On: 09/26/2020 23:56   DG Chest Portable 1 View  Result Date: 09/26/2020 CLINICAL DATA:  Right hip and leg EXAM: PORTABLE CHEST 1 VIEW COMPARISON:  02/02/2020 FINDINGS: Stable cardiomegaly. Unchanged mediastinal contours with aortic atherosclerosis. No focal airspace disease. No pleural effusion or pneumothorax. No pulmonary edema. No acute osseous abnormalities are seen. Chronic degenerative change of the first rib at the chondral junction. IMPRESSION: Stable cardiomegaly. No acute abnormality. Electronically Signed   By: Keith Rake M.D.   On: 09/26/2020 22:13   DG Knee Complete 4 Views Right  Result Date: 09/26/2020 CLINICAL DATA:  Right leg pain.  Assess for fracture. EXAM: RIGHT KNEE - COMPLETE 4+ VIEW COMPARISON:  None. FINDINGS: Right knee arthroplasty in expected alignment. No periprosthetic fracture or lucency. There has been patellar resurfacing. Trace joint effusion. No erosion or focal bone lesion. Mild medial soft tissue edema. IMPRESSION: 1. No acute fracture. 2. Right knee arthroplasty without complication. 3. Mild medial soft tissue edema. Electronically Signed   By: Keith Rake M.D.   On: 09/26/2020 22:12   DG HIP UNILAT WITH PELVIS 2-3 VIEWS RIGHT  Result Date: 09/26/2020 CLINICAL DATA:  Right hip pain. EXAM: DG HIP (WITH OR WITHOUT PELVIS) 2-3V RIGHT COMPARISON:  Radiograph 01/13/2016 FINDINGS: No fracture. Similar right hip joint space narrowing with lateral and inferior acetabular spurring. Femoral head is well seated. No evidence of a vascular necrosis or focal bone lesion. No bone destruction. Pubic symphysis and  sacroiliac joints are congruent with mild degenerative change. Mild enthesopathic changes noted about the greater trochanter. Suspected labral calcifications. IMPRESSION: Moderate right hip osteoarthritis without acute osseous abnormality. Electronically Signed   By: Keith Rake M.D.   On: 09/26/2020 22:11     Assessment/Plan 87 year old male with history of A. fib on Eliquis, HTN, hearing impairment, CKD 4, chronic lower extremity venous stasis, presenting with generalized weakness, as well as right leg pain.      Generalized weakness -Suspect multifactorial and related to dehydration given AKI, anemia, hypokalemia - Treat potentially etiologies - PT consult  AKI superimposed on CKD IV - Possibly related to high-dose diuretic that patient takes for his bilateral lower extremity edema (no CHF history per cardiology note )and may be decreased oral intake - IV hydration and monitor - Creatinine 3.36 up from baseline of 2.41 - Consider nephrology consult if not improving  Anemia, acuity uncertain - Hemoglobin 9.9, down from 12.1 about 4 months prior - Continue to monitor - Follow anemia panel - Stool for occult blood    Essential hypertension, benign - We will hold losartan and furosemide due to renal function - Hydralazine as needed for BP control    Hypokalemia - Potassium 3.1.  Oral supplementation for now and monitor    A-fib on Eliquis - Not currently on rate control agents pending med rec - Continue Eliquis but with monitoring of hemoglobin in view of anemia  Depression/Anxiety - On bupropion and clonazepam    Right leg pain - Doppler negative for DVT  Bilateral lower extremity edema - non -CHF.Cardiology outpatient note reviewed.  - Hold Lasix for now     DVT prophylaxis: Eliquis  code Status: Discussed CODE STATUS with patient, he states he is 89 and he has lived a long good life and would  not want to be resuscitated Family Communication:  none  Disposition  Plan: Back to previous home environment Consults called: none  Status:observation    Athena Masse MD [facility]     09/27/2020, 12:35 AM

## 2020-09-27 NOTE — Evaluation (Addendum)
Physical Therapy Evaluation Patient Details Name: Stephen Garrett MRN: [id number] DOB: 1923/09/25 Today's Date: 09/27/2020   History of Present Illness  Pt is a 87 y/o M admitted on 09/26/20 with c/c of generalized weakness & R leg pain. Pt being treated for generalized weakness suspect to be multifactoral & related to dehydration given AKI, anemia, and hypokalemia. PMH: a-fib on eliquis, HTN, hearing impairment, CKD4, chronic lower extremity venous stasis, heart murmur, HOH (L hearing aide, R cochlear implant), RLS, lumbar spinal stenosis  Clinical Impression  Pt seen for PT evaluation with pt received in chair, asleep, with phone to his ear. Pt awakened but pt HOH which impedes communication. Attempted to use written communication which helped some. Pt is able to complete sit<>stand transfers with supervision & ambulate 1 lap around nurses station with RW & min assist fade to close supervision at times with decreased gait speed overall & pt noting significant fatigue by end of lap. Pt with very cold hands and SpO2 reading as low as 51% but pt in no apparent distress; once pt returned to room SpO2 intermittently reading & when it did, was 84-97%. Notified nurse Stephen Bamberg) who came in to assess pt who was already drifting off to sleep. Anticipate pt can return to ALF with HHPT f/u.   Addendum: Pt noted to have low K+ (2.8) but pt scheduled to receive medication & MD cleared pt for participation via secure chat. Pt noted to have weeping wound from lateral aspect of distal LLE & nurse notified.    Follow Up Recommendations Home health PT;Supervision/Assistance - 24 hour (return to ALF with HHPT)    Equipment Recommendations  Rolling walker with 5" wheels    Recommendations for Other Services       Precautions / Restrictions Precautions Precautions: Fall Restrictions Weight Bearing Restrictions: No      Mobility  Bed Mobility Overal bed mobility: Needs Assistance Bed Mobility: Sit to  Supine       Sit to supine: Supervision        Transfers Overall transfer level: Needs assistance Equipment used: Rolling walker (2 wheeled) Transfers: Sit to/from Stand Sit to Stand: Supervision            Ambulation/Gait Ambulation/Gait assistance: Min assist;Supervision (min assist fade to close supervision) Gait Distance (Feet): 175 Feet Assistive device: Right platform walker Gait Pattern/deviations: Decreased step length - right;Decreased step length - left;Decreased stride length Gait velocity: decreased      Stairs            Wheelchair Mobility    Modified Rankin (Stroke Patients Only)       Balance Overall balance assessment: Needs assistance Sitting-balance support: No upper extremity supported;Feet supported Sitting balance-Leahy Scale: Fair       Standing balance-Leahy Scale: Poor                               Pertinent Vitals/Pain Pain Assessment: No/denies pain    Home Living Family/patient expects to be discharged to:: Assisted living (OT reports pt is from [facility]F)                      Prior Function                 Hand Dominance        Extremity/Trunk Assessment   Upper Extremity Assessment Upper Extremity Assessment: Overall WFL for tasks assessed  Lower Extremity Assessment Lower Extremity Assessment: Generalized weakness    Cervical / Trunk Assessment Cervical / Trunk Assessment: Kyphotic  Communication   Communication: HOH (L hearing aide, R cochlear implant, used written communication (large letters) to assist as needed during session)  Cognition Arousal/Alertness: Awake/alert Behavior During Therapy: WFL for tasks assessed/performed Overall Cognitive Status: Difficult to assess                                        General Comments      Exercises     Assessment/Plan    PT Assessment Patient needs continued PT services  PT Problem List Decreased  safety awareness;Decreased strength;Decreased mobility;Decreased activity tolerance;Decreased balance       PT Treatment Interventions Therapeutic activities;DME instruction;Modalities;Gait training;Therapeutic exercise;Patient/family education;Balance training;Functional mobility training;Neuromuscular re-education;Manual techniques    PT Goals (Current goals can be found in the Care Plan section)  Acute Rehab PT Goals Patient Stated Goal: none stated PT Goal Formulation: With patient Time For Goal Achievement: 10/11/20 Potential to Achieve Goals: Good    Frequency Min 2X/week   Barriers to discharge        Co-evaluation               AM-PAC PT "6 Clicks" Mobility  Outcome Measure Help needed turning from your back to your side while in a flat bed without using bedrails?: None Help needed moving from lying on your back to sitting on the side of a flat bed without using bedrails?: A Little Help needed moving to and from a bed to a chair (including a wheelchair)?: A Little Help needed standing up from a chair using your arms (e.g., wheelchair or bedside chair)?: A Little Help needed to walk in hospital room?: A Little Help needed climbing 3-5 steps with a railing? : A Lot 6 Click Score: 18    End of Session   Activity Tolerance: Patient tolerated treatment well Patient left: in bed;with bed alarm set;with nursing/sitter in room Nurse Communication:  (O2) PT Visit Diagnosis: Muscle weakness (generalized) (M62.81)    Time: 2831-5176 PT Time Calculation (min) (ACUTE ONLY): 29 min   Charges:   PT Evaluation $PT Eval Low Complexity: 1 Low PT Treatments $Therapeutic Activity: 8-22 mins        Lavone Nian, PT, DPT 09/27/20, 2:02 PM   Waunita Schooner 09/27/2020, 2:00 PM

## 2020-09-28 DIAGNOSIS — R531 Weakness: Secondary | ICD-10-CM | POA: Diagnosis not present

## 2020-09-28 LAB — BASIC METABOLIC PANEL
Anion gap: 8 (ref 5–15)
BUN: 77 mg/dL — ABNORMAL HIGH (ref 8–23)
CO2: 30 mmol/L (ref 22–32)
Calcium: 9.6 mg/dL (ref 8.9–10.3)
Chloride: 102 mmol/L (ref 98–111)
Creatinine, Ser: 2.61 mg/dL — ABNORMAL HIGH (ref 0.61–1.24)
GFR, Estimated: 22 mL/min — ABNORMAL LOW (ref >60)
Glucose, Bld: 120 mg/dL — ABNORMAL HIGH (ref 70–99)
Potassium: 4 mmol/L (ref 3.5–5.1)
Sodium: 140 mmol/L (ref 135–145)

## 2020-09-28 LAB — CBC WITH DIFFERENTIAL/PLATELET
Abs Immature Granulocytes: 0.03 10*3/uL (ref 0.00–0.07)
Basophils Absolute: 0 10*3/uL (ref 0.0–0.1)
Basophils Relative: 0 %
Eosinophils Absolute: 0 10*3/uL (ref 0.0–0.5)
Eosinophils Relative: 0 %
HCT: 33.4 % — ABNORMAL LOW (ref 39.0–52.0)
Hemoglobin: 10.6 g/dL — ABNORMAL LOW (ref 13.0–17.0)
Immature Granulocytes: 0 %
Lymphocytes Relative: 19 %
Lymphs Abs: 1.3 10*3/uL (ref 0.7–4.0)
MCH: 30.5 pg (ref 26.0–34.0)
MCHC: 31.7 g/dL (ref 30.0–36.0)
MCV: 96 fL (ref 80.0–100.0)
Monocytes Absolute: 0.4 10*3/uL (ref 0.1–1.0)
Monocytes Relative: 6 %
Neutro Abs: 5 10*3/uL (ref 1.7–7.7)
Neutrophils Relative %: 75 %
Platelets: 150 10*3/uL (ref 150–400)
RBC: 3.48 MIL/uL — ABNORMAL LOW (ref 4.22–5.81)
RDW: 15.7 % — ABNORMAL HIGH (ref 11.5–15.5)
WBC: 6.8 10*3/uL (ref 4.0–10.5)
nRBC: 0 % (ref 0.0–0.2)

## 2020-09-28 LAB — MAGNESIUM: Magnesium: 2 mg/dL (ref 1.7–2.4)

## 2020-09-28 LAB — PHOSPHORUS: Phosphorus: 2.5 mg/dL (ref 2.5–4.6)

## 2020-09-28 MED ORDER — POTASSIUM CHLORIDE CRYS ER 10 MEQ PO TBCR: 10.0000 meq | EXTENDED_RELEASE_TABLET | Freq: Two times a day (BID) | ORAL | 0 refills | Status: DC

## 2020-09-28 MED ORDER — AMLODIPINE BESYLATE 5 MG PO TABS: 5.0000 mg | ORAL_TABLET | Freq: Every day | ORAL | 11 refills | Status: DC

## 2020-09-28 MED ORDER — FUROSEMIDE 40 MG PO TABS: 40.0000 mg | ORAL_TABLET | Freq: Every day | ORAL | 0 refills | Status: DC

## 2020-09-28 MED ORDER — ACETAMINOPHEN 325 MG PO TABS: 650.0000 mg | ORAL_TABLET | Freq: Four times a day (QID) | ORAL | Status: AC | PRN

## 2020-09-28 NOTE — Discharge Summary (Signed)
Physician Discharge Summary  HAYWOOD MEINDERS OVZ:[id number] DOB: [birth year] DOA: 09/26/2020  PCP: [name], MD  Admit date: 09/26/2020 Discharge date: 09/28/2020  Admitted From: Assisted living facility Disposition: Assisted living facility  Recommendations for Outpatient Follow-up:  Follow up with PCP in 1-2 weeks Please obtain BMP/CBC/magnesium in one week   Home Health: PT/OT Equipment/Devices: Already present  Discharge Condition: Fair CODE STATUS: DNR Diet recommendation: Low-salt diet  Discharge summary: 87 year old gentleman with history of chronic A. fib on Eliquis, hypertension, CKD stage IV, chronic lower extremity venous stasis who was sent to emergency department with generalized weakness, difficulty get out of the bed, right groin pain from an assisted living facility.  Patient lives in [facility] [facility] assisted living facility, walks with a walker but recently having difficulties with walking.  On arrival to ER blood pressure stable.  98% on room air.  Creatinine 3.36 with known baseline creatinine of 2.4.  Hemoglobin 9.9.  Chest x-ray with no acute abnormality.  Skeletal survey including right hip and pelvis without any acute findings.  Lower extremity venous duplex is without DVT.  Treated with IV fluids and admitted for symptomatic treatment.  Potassium was very low.  Generalized weakness/debility: Patient with profound physical debility.  Clinically improving today.  Seen by PT OT.  Recommended to continue work with PT OT at assisted living facility.  Acute kidney injury superimposed on chronic kidney disease stage IV: Baseline creatinine about 2.4.  Presented with creatinine of 3.36.  Treated with gentle IV fluids and levels are improving.  Gradually trending down. Will resume Lasix on reduced dose 40 mg daily.  Will avoid losartan.  Started on amlodipine for blood pressure. Please recheck levels in 1 week to ensure stabilization.  Essential hypertension: Blood  pressures fairly stable.  Apparently losartan was on hold.  Looks like with fluctuating renal functions, he will better benefit with amlodipine.  Will start on amlodipine 5 mg daily.  Resume Lasix with reduced dose.  Hypokalemia: Persistent.  aggressively replaced with normalization.  Will prescribe scheduled potassium replacement along with Lasix and metolazone.  Recheck in 1 week.  Chronic A. fib on Eliquis: Rate controlled.  Not on any rate control medication.  Therapeutic on Eliquis.  Bilateral lower extremity edema: Chronic.  With chronic venous stasis changes.  Negative for DVT.  Also complained of right groin and hip area pain probably due to osteoarthritis.  Will work with PT OT.  Chronic diastolic heart failure: Compensated.  Euvolemic.   Patient is medically stabilized to transfer back to assisted living facility.  He will need help and assistance with activities.   Discharge Diagnoses:  Principal Problem:   Generalized weakness Active Problems:   Essential hypertension, benign   Acute kidney injury superimposed on CKD IV (HCC)   Hypokalemia   A-fib (HCC)   Chronic diastolic heart failure (HCC)   Vascular dementia without behavioral disturbance (HCC)   Right leg pain   Bilateral lower extremity edema   Anemia    Discharge Instructions  Discharge Instructions     Call MD for:  difficulty breathing, headache or visual disturbances   Complete by: As directed    Call MD for:  severe uncontrolled pain   Complete by: As directed    Diet - low sodium heart healthy   Complete by: As directed    Increase activity slowly   Complete by: As directed       Allergies as of 09/28/2020       Reactions  Atorvastatin    REACTION: raises blood pressure   Pravastatin Sodium    REACTION: raises blood pressure   Statins    REACTION: raises bp        Medication List     STOP taking these medications    losartan 50 MG tablet Commonly known as: COZAAR       TAKE  these medications    acetaminophen 325 MG tablet Commonly known as: TYLENOL Take 2 tablets (650 mg total) by mouth every 6 (six) hours as needed for mild pain (or Fever >/= 101).   amLODipine 5 MG tablet Commonly known as: NORVASC Take 1 tablet (5 mg total) by mouth daily.   apixaban 2.5 MG Tabs tablet Commonly known as: ELIQUIS Take 1 tablet (2.5 mg total) by mouth 2 (two) times daily.   buPROPion 150 MG 24 hr tablet Commonly known as: WELLBUTRIN XL TAKE 1 TABLET BY MOUTH DAILY   clonazePAM 0.5 MG tablet Commonly known as: KLONOPIN TAKE ONE TO TWO TABLETS AT BEDTIME What changed: See the new instructions.   finasteride 5 MG tablet Commonly known as: PROSCAR TAKE ONE TABLET BY MOUTH EVERY DAY   furosemide 40 MG tablet Commonly known as: LASIX Take 1 tablet (40 mg total) by mouth daily. If home weight is over 175#--take a second dose after lunch What changed:  medication strength how much to take   metolazone 2.5 MG tablet Commonly known as: ZAROXOLYN Take 2.5 mg by mouth 2 (two) times a week.   multivitamin capsule Take 1 capsule by mouth daily.   omeprazole 40 MG capsule Commonly known as: PRILOSEC TAKE 1 CAPSULE BY MOUTH ONCE DAILY   potassium chloride 10 MEQ tablet Commonly known as: KLOR-CON Take 1 tablet (10 mEq total) by mouth 2 (two) times daily for 14 days.   pramipexole 1 MG tablet Commonly known as: MIRAPEX TAKE ONE TABLET AT DINNER AND ONE TABLETAT BEDTIME What changed: See the new instructions.   pyridoxine 100 MG tablet Commonly known as: B-6 Take 100 mg by mouth daily.   tamsulosin 0.4 MG Caps capsule Commonly known as: FLOMAX TAKE 1 CAPSULE EVERY DAY        Follow-up Information     Viviana Simpler I, MD Follow up in 2 week(s).   Specialties: Internal Medicine, Pediatrics Contact information: [place] [address] [phone]                Allergies  Allergen Reactions   Atorvastatin      REACTION: raises blood pressure   Pravastatin Sodium     REACTION: raises blood pressure   Statins     REACTION: raises bp    Consultations: None   Procedures/Studies: US Venous Img Lower Unilateral Right  Result Date: 09/26/2020 CLINICAL DATA:  Right lower extremity pain and swelling EXAM: RIGHT LOWER EXTREMITY VENOUS DOPPLER ULTRASOUND TECHNIQUE: Gray-scale sonography with compression, as well as color and duplex ultrasound, were performed to evaluate the deep venous system(s) from the level of the common femoral vein through the popliteal and proximal calf veins. COMPARISON:  None. FINDINGS: VENOUS Normal compressibility of the common femoral, superficial femoral, and popliteal veins, as well as the visualized calf veins. Visualized portions of profunda femoral vein and great saphenous vein unremarkable. No filling defects to suggest DVT on grayscale or color Doppler imaging. Doppler waveforms show normal direction of venous flow, normal respiratory plasticity and response to augmentation. Arterialized waveform likely represents artifact from the adjacent arterial vasculature,  best appreciated within the a distal femoral vein. Limited views of the contralateral common femoral vein are unremarkable. OTHER None. Limitations: none IMPRESSION: Negative. Electronically Signed   By: Fidela Salisbury MD   On: 09/26/2020 23:56   DG Chest Portable 1 View  Result Date: 09/26/2020 CLINICAL DATA:  Right hip and leg EXAM: PORTABLE CHEST 1 VIEW COMPARISON:  02/02/2020 FINDINGS: Stable cardiomegaly. Unchanged mediastinal contours with aortic atherosclerosis. No focal airspace disease. No pleural effusion or pneumothorax. No pulmonary edema. No acute osseous abnormalities are seen. Chronic degenerative change of the first rib at the chondral junction. IMPRESSION: Stable cardiomegaly. No acute abnormality. Electronically Signed   By: Keith Rake M.D.   On: 09/26/2020 22:13   DG Knee Complete 4 Views  Right  Result Date: 09/26/2020 CLINICAL DATA:  Right leg pain.  Assess for fracture. EXAM: RIGHT KNEE - COMPLETE 4+ VIEW COMPARISON:  None. FINDINGS: Right knee arthroplasty in expected alignment. No periprosthetic fracture or lucency. There has been patellar resurfacing. Trace joint effusion. No erosion or focal bone lesion. Mild medial soft tissue edema. IMPRESSION: 1. No acute fracture. 2. Right knee arthroplasty without complication. 3. Mild medial soft tissue edema. Electronically Signed   By: Keith Rake M.D.   On: 09/26/2020 22:12   DG HIP UNILAT WITH PELVIS 2-3 VIEWS RIGHT  Result Date: 09/26/2020 CLINICAL DATA:  Right hip pain. EXAM: DG HIP (WITH OR WITHOUT PELVIS) 2-3V RIGHT COMPARISON:  Radiograph 01/13/2016 FINDINGS: No fracture. Similar right hip joint space narrowing with lateral and inferior acetabular spurring. Femoral head is well seated. No evidence of a vascular necrosis or focal bone lesion. No bone destruction. Pubic symphysis and sacroiliac joints are congruent with mild degenerative change. Mild enthesopathic changes noted about the greater trochanter. Suspected labral calcifications. IMPRESSION: Moderate right hip osteoarthritis without acute osseous abnormality. Electronically Signed   By: Keith Rake M.D.   On: 09/26/2020 22:11   (Echo, Carotid, EGD, Colonoscopy, ERCP)    Subjective: Patient seen and examined.  Denies any complaints.  First thing he tells me in the morning "let me go back to [facility] as soon as possible" No overnight events.   Discharge Exam: Vitals:   09/28/20 0414 09/28/20 0803  BP: (!) 157/87 (!) 176/102  Pulse: 83 81  Resp: 19 16  Temp: 98.5 F (36.9 C) 98.2 F (36.8 C)  SpO2: 100% 94%   Vitals:   09/28/20 0015 09/28/20 0020 09/28/20 0414 09/28/20 0803  BP:   (!) 157/87 (!) 176/102  Pulse:   83 81  Resp:   19 16  Temp:   98.5 F (36.9 C) 98.2 F (36.8 C)  TempSrc:      SpO2: (!) 77% 98% 100% 94%  Weight:      Height:         General: Pt is alert, awake, not in acute distress On room air.  Sitting in chair.  Looks fairly comfortable.  Appropriate for his age. Cardiovascular: RRR, S1/S2 +, no rubs, no gallops Respiratory: CTA bilaterally, no wheezing, no rhonchi Abdominal: Soft, NT, ND, bowel sounds + Extremities: Patient has chronic none pedal edema, pigmentation and venous stasis changes.    The results of significant diagnostics from this hospitalization (including imaging, microbiology, ancillary and laboratory) are listed below for reference.     Microbiology: Recent Results (from the past 240 hour(s))  Resp Panel by RT-PCR (Flu A&B, Covid) Nasopharyngeal Swab     Status: None   Collection Time: 09/27/20 12:57 AM  Specimen: Nasopharyngeal Swab; Nasopharyngeal(NP) swabs in vial transport medium  Result Value Ref Range Status   SARS Coronavirus 2 by RT PCR NEGATIVE NEGATIVE Final    Comment: (NOTE) SARS-CoV-2 target nucleic acids are NOT DETECTED.  The SARS-CoV-2 RNA is generally detectable in upper respiratory specimens during the acute phase of infection. The lowest concentration of SARS-CoV-2 viral copies this assay can detect is 138 copies/mL. A negative result does not preclude SARS-Cov-2 infection and should not be used as the sole basis for treatment or other patient management decisions. A negative result may occur with  improper specimen collection/handling, submission of specimen other than nasopharyngeal swab, presence of viral mutation(s) within the areas targeted by this assay, and inadequate number of viral copies(<138 copies/mL). A negative result must be combined with clinical observations, patient history, and epidemiological information. The expected result is Negative.  Fact Sheet for Patients:  EntrepreneurPulse.com.au  Fact Sheet for Healthcare Providers:  IncredibleEmployment.be  This test is no t yet approved or cleared by the  [place] FDA and  has been authorized for detection and/or diagnosis of SARS-CoV-2 by FDA under an Emergency Use Authorization (EUA). This EUA will remain  in effect (meaning this test can be used) for the duration of the COVID-19 declaration under Section 564(b)(1) of the Act, 21 U.S.C.section 360bbb-3(b)(1), unless the authorization is terminated  or revoked sooner.       Influenza A by PCR NEGATIVE NEGATIVE Final   Influenza B by PCR NEGATIVE NEGATIVE Final    Comment: (NOTE) The Xpert Xpress SARS-CoV-2/FLU/RSV plus assay is intended as an aid in the diagnosis of influenza from Nasopharyngeal swab specimens and should not be used as a sole basis for treatment. Nasal washings and aspirates are unacceptable for Xpert Xpress SARS-CoV-2/FLU/RSV testing.  Fact Sheet for Patients: EntrepreneurPulse.com.au  Fact Sheet for Healthcare Providers: IncredibleEmployment.be  This test is not yet approved or cleared by the [place] FDA and has been authorized for detection and/or diagnosis of SARS-CoV-2 by FDA under an Emergency Use Authorization (EUA). This EUA will remain in effect (meaning this test can be used) for the duration of the COVID-19 declaration under Section 564(b)(1) of the Act, 21 U.S.C. section 360bbb-3(b)(1), unless the authorization is terminated or revoked.  Performed at [facility], [address] [place], [place] [address]      Labs: BNP (last 3 results) Recent Labs    01/01/20 1813  BNP 829.9*   Basic Metabolic Panel: Recent Labs  Lab 09/26/20 2109 09/27/20 0618 09/27/20 0822 09/28/20 0419  NA 135 137  --  140  K 3.1* 2.8*  --  4.0  CL 97* 97*  --  102  CO2 27 31  --  30  GLUCOSE 119* 105*  --  120*  BUN 97* 94*  --  77*  CREATININE 3.36* 3.13*  --  2.61*  CALCIUM 8.8* 9.1  --  9.6  MG  --   --  2.0 2.0  PHOS  --   --   --  2.5   Liver Function Tests: Recent Labs  Lab 09/26/20 2109   AST 44*  ALT 24  ALKPHOS 83  BILITOT 1.3*  PROT 6.1*  ALBUMIN 3.4*   No results for input(s): LIPASE, AMYLASE in the last 168 hours. No results for input(s): AMMONIA in the last 168 hours. CBC: Recent Labs  Lab 09/26/20 2109 09/27/20 0618 09/28/20 0419  WBC 5.2 4.9 6.8  NEUTROABS 3.7  --  5.0  HGB 9.9*  10.1* 10.6*  HCT 30.3* 31.0* 33.4*  MCV 94.7 94.5 96.0  PLT 128* 121* 150   Cardiac Enzymes: No results for input(s): CKTOTAL, CKMB, CKMBINDEX, TROPONINI in the last 168 hours. BNP: Invalid input(s): POCBNP CBG: No results for input(s): GLUCAP in the last 168 hours. D-Dimer No results for input(s): DDIMER in the last 72 hours. Hgb A1c No results for input(s): HGBA1C in the last 72 hours. Lipid Profile No results for input(s): CHOL, HDL, LDLCALC, TRIG, CHOLHDL, LDLDIRECT in the last 72 hours. Thyroid function studies No results for input(s): TSH, T4TOTAL, T3FREE, THYROIDAB in the last 72 hours.  Invalid input(s): FREET3 Anemia work up Recent Labs    09/27/20 0618  VITAMINB12 861  FOLATE 54.0  FERRITIN 179  TIBC 280  IRON 43*   Urinalysis    Component Value Date/Time   COLORURINE YELLOW (A) 09/26/2020 2153   APPEARANCEUR HAZY (A) 09/26/2020 2153   LABSPEC 1.009 09/26/2020 2153   PHURINE 7.0 09/26/2020 2153   GLUCOSEU NEGATIVE 09/26/2020 2153   HGBUR SMALL (A) 09/26/2020 2153   HGBUR large 09/03/2008 0933   BILIRUBINUR NEGATIVE 09/26/2020 2153   BILIRUBINUR Negative 08/04/2017 1446   KETONESUR NEGATIVE 09/26/2020 2153   PROTEINUR NEGATIVE 09/26/2020 2153   UROBILINOGEN 0.2 08/04/2017 1446   UROBILINOGEN 0.2 09/03/2008 0933   NITRITE NEGATIVE 09/26/2020 2153   LEUKOCYTESUR NEGATIVE 09/26/2020 2153   Sepsis Labs Invalid input(s): PROCALCITONIN,  WBC,  LACTICIDVEN Microbiology Recent Results (from the past 240 hour(s))  Resp Panel by RT-PCR (Flu A&B, Covid) Nasopharyngeal Swab     Status: None   Collection Time: 09/27/20 12:57 AM   Specimen:  Nasopharyngeal Swab; Nasopharyngeal(NP) swabs in vial transport medium  Result Value Ref Range Status   SARS Coronavirus 2 by RT PCR NEGATIVE NEGATIVE Final    Comment: (NOTE) SARS-CoV-2 target nucleic acids are NOT DETECTED.  The SARS-CoV-2 RNA is generally detectable in upper respiratory specimens during the acute phase of infection. The lowest concentration of SARS-CoV-2 viral copies this assay can detect is 138 copies/mL. A negative result does not preclude SARS-Cov-2 infection and should not be used as the sole basis for treatment or other patient management decisions. A negative result may occur with  improper specimen collection/handling, submission of specimen other than nasopharyngeal swab, presence of viral mutation(s) within the areas targeted by this assay, and inadequate number of viral copies(<138 copies/mL). A negative result must be combined with clinical observations, patient history, and epidemiological information. The expected result is Negative.  Fact Sheet for Patients:  EntrepreneurPulse.com.au  Fact Sheet for Healthcare Providers:  IncredibleEmployment.be  This test is no t yet approved or cleared by the [place] FDA and  has been authorized for detection and/or diagnosis of SARS-CoV-2 by FDA under an Emergency Use Authorization (EUA). This EUA will remain  in effect (meaning this test can be used) for the duration of the COVID-19 declaration under Section 564(b)(1) of the Act, 21 U.S.C.section 360bbb-3(b)(1), unless the authorization is terminated  or revoked sooner.       Influenza A by PCR NEGATIVE NEGATIVE Final   Influenza B by PCR NEGATIVE NEGATIVE Final    Comment: (NOTE) The Xpert Xpress SARS-CoV-2/FLU/RSV plus assay is intended as an aid in the diagnosis of influenza from Nasopharyngeal swab specimens and should not be used as a sole basis for treatment. Nasal washings and aspirates are unacceptable for  Xpert Xpress SARS-CoV-2/FLU/RSV testing.  Fact Sheet for Patients: EntrepreneurPulse.com.au  Fact Sheet for Healthcare Providers: IncredibleEmployment.be  This  test is not yet approved or cleared by the [place] and has been authorized for detection and/or diagnosis of SARS-CoV-2 by FDA under an Emergency Use Authorization (EUA). This EUA will remain in effect (meaning this test can be used) for the duration of the COVID-19 declaration under Section 564(b)(1) of the Act, 21 U.S.C. section 360bbb-3(b)(1), unless the authorization is terminated or revoked.  Performed at [facility], [address], [place], [place] [address]      Time coordinating discharge: 35 minutes  SIGNED:   Barb Merino, MD  [facility] 09/28/2020, 10:41 AM

## 2020-09-28 NOTE — TOC Progression Note (Signed)
Transition of Care [facility]) - Progression Note    Patient Details  Name: Stephen Garrett MRN: [id number] Date of Birth: 1924-02-06  Transition [facility] Care [facility]) CM/SW [place], RN Phone Number: 09/28/2020, 1:43 PM  Clinical Narrative:     The patient is up and ready and dressed , he has called [facility] transportation to pick him up, they will be here at 2 PM to pick him up and take back to [facility]       Expected Discharge Plan and Services           Expected Discharge Date: 09/28/20                                     Social Determinants of Health (SDOH) Interventions    Readmission Risk Interventions No flowsheet data found.

## 2020-09-28 NOTE — Plan of Care (Signed)
Plan of Care Ongoing Problem: Education: Goal: Knowledge of General Education information will improve Description: Including pain rating scale, medication(s)/side effects and non-pharmacologic comfort measures 09/28/2020 0428 by Sutton Plake, Crist Fat, RN Outcome: Progressing  Problem: Clinical Measurements: Goal: Will remain free from infection 09/28/2020 0428 by Edla Para, Crist Fat, RN Outcome: Progressing  Problem: Clinical Measurements: Goal: Respiratory complications will improve 09/28/2020 0428 by Neva Ramaswamy, Crist Fat, RN Outcome: Progressing  Problem: Activity: Goal: Risk for activity intolerance will decrease 09/28/2020 0428 by Peniel Biel, Crist Fat, RN Outcome: Progressing  Problem: Skin Integrity: Goal: Risk for impaired skin integrity will decrease 09/28/2020 0428 by Ezriel Boffa, Crist Fat, RN Outcome: Progressing  Problem: Pain Managment: Goal: General experience of comfort will improve 09/28/2020 0428 by Christi Wirick, Crist Fat, RN Outcome: Progressing   Patient sleeping between care. Oriented x3 with intermittent confusion. Denies pain. Expiratory wheezes noted. Bed alarm on.  Overnight night pt was ambulating in room. SpO2 77% on RA. No acute distress noted. Patient safely returned to bed, SpO2 98% on RA.

## 2020-09-28 NOTE — TOC Progression Note (Signed)
Transition of Care [facility]) - Progression Note    Patient Details  Name: Stephen Garrett MRN: [id number] Date of Birth: 08-Apr-1923  Transition of Care [facility]) CM/SW [place], RN Phone Number: 09/28/2020, 10:54 AM  Clinical Narrative:     Damaris Schooner with Estill Bamberg at [facility], Faxed Clinical notes and DC Summary to her at [phone], she stated that the patient's daughter is out of town, He will need EMS to transport       Expected Discharge Plan and Services           Expected Discharge Date: 09/28/20                                     Social Determinants of Health (SDOH) Interventions    Readmission Risk Interventions No flowsheet data found.

## 2020-09-28 NOTE — NC FL2 (Signed)
[place] LEVEL OF CARE SCREENING TOOL     IDENTIFICATION  Patient Name: Stephen Garrett Birthdate: 1923-08-31 Sex: male Admission Date (Current Location): 09/26/2020  [place] and [place] Number:  Engineering geologist and Address:  [facility], [address] [place], [place] [address]      Provider Number: [id number]  Attending Physician Name and Address:  No att. providers found  Relative Name and Phone Number:       Current Level of Care: Other (Comment) (ALF) Recommended Level of Care: [place] Prior Approval Number:    Date Approved/Denied:   PASRR Number:    Discharge Plan: SNF    Current Diagnoses: Patient Active Problem List   Diagnosis Date Noted   Right leg pain 09/27/2020   Bilateral lower extremity edema 09/27/2020   Anemia 09/27/2020   Vascular dementia without behavioral disturbance ([place]) 08/13/2020   Stasis ulcer ([place]) 08/13/2020   AKI (acute kidney injury) ([place]) 02/02/2020   Chronic diastolic heart failure ([place]) 01/14/2020   Acute kidney injury superimposed on CKD IV ([place]) 01/01/2020   Hypokalemia 01/01/2020   A-fib ([place]) 01/01/2020   Diplopia 09/25/2017   TIA (transient ischemic attack) 09/14/2017   Generalized weakness 08/04/2017   Retinal vein occlusion, central 02/19/2014   Chronic venous insufficiency 02/19/2014   Episodic mood disorder ([place]) 12/20/2013   Advanced directives, counseling/discussion 12/20/2013   Routine general medical examination at a health care facility 11/02/2012   Chronic kidney disease, stage IV (severe) ([place]) 01/26/2009   Restless legs syndrome 10/03/2008   OSTEOARTHRITIS 12/12/2006   SPINAL STENOSIS, LUMBAR 06/24/2006   Essential hypertension, benign 06/21/2006   GERD 06/21/2006   DIVERTICULOSIS, COLON 06/21/2006   BPH with obstruction/lower urinary tract symptoms 06/21/2006    Orientation RESPIRATION BLADDER Height & Weight     Self, Time, Situation,  Place  Normal Continent Weight: 77.6 kg Height:  5\' 7"  (170.2 cm)  BEHAVIORAL SYMPTOMS/MOOD NEUROLOGICAL BOWEL NUTRITION STATUS      Continent Diet (regular)  AMBULATORY STATUS COMMUNICATION OF NEEDS Skin   Extensive Assist Verbally Normal                       Personal Care Assistance Level of Assistance  Bathing, Dressing Bathing Assistance: Limited assistance   Dressing Assistance: Limited assistance     Functional Limitations Info  Hearing   Hearing Info: Impaired      SPECIAL CARE FACTORS FREQUENCY  PT (By licensed PT)     PT Frequency: 5 times per week              Contractures      Additional Factors Info  Allergies, Code Status Code Status Info: DNR Allergies Info: Atorvastatin, Pravastatin Sodium, Statins           Current Medications (09/28/2020):  This is the current hospital active medication list Current Facility-Administered Medications  Medication Dose Route Frequency Provider Last Rate Last Admin   acetaminophen (TYLENOL) tablet 650 mg  650 mg Oral Q6H PRN Athena Masse, MD       Or   acetaminophen (TYLENOL) suppository 650 mg  650 mg Rectal Q6H PRN Athena Masse, MD       apixaban Arne Cleveland) tablet 2.5 mg  2.5 mg Oral BID Judd Gaudier V, MD   2.5 mg at 09/28/20 0800   buPROPion (WELLBUTRIN XL) 24 hr tablet 150 mg  150 mg Oral Daily Barb Merino, MD   150 mg  at 09/28/20 0800   clonazePAM (KLONOPIN) tablet 0.25 mg  0.25 mg Oral QHS Barb Merino, MD   0.25 mg at 09/27/20 2115   finasteride (PROSCAR) tablet 5 mg  5 mg Oral Daily Barb Merino, MD   5 mg at 09/28/20 0800   HYDROcodone-acetaminophen (NORCO/VICODIN) 5-325 MG per tablet 1-2 tablet  1-2 tablet Oral Q4H PRN Athena Masse, MD   1 tablet at 09/28/20 0800   multivitamin with minerals tablet 1 tablet  1 tablet Oral Daily Barb Merino, MD   1 tablet at 09/28/20 0800   ondansetron (ZOFRAN) tablet 4 mg  4 mg Oral Q6H PRN Athena Masse, MD       Or   ondansetron [facility])  injection 4 mg  4 mg Intravenous Q6H PRN Athena Masse, MD       pantoprazole (PROTONIX) EC tablet 40 mg  40 mg Oral Daily Barb Merino, MD   40 mg at 09/28/20 0800   pramipexole (MIRAPEX) tablet 1 mg  1 mg Oral BID Barb Merino, MD   1 mg at 09/27/20 2116   pyridOXINE (VITAMIN B-6) tablet 100 mg  100 mg Oral Daily Barb Merino, MD   100 mg at 09/28/20 0809   tamsulosin (FLOMAX) capsule 0.4 mg  0.4 mg Oral Daily Barb Merino, MD   0.4 mg at 09/28/20 0800   zolpidem (AMBIEN) tablet 5 mg  5 mg Oral QHS PRN Athena Masse, MD   5 mg at 09/27/20 2297   Current Outpatient Medications  Medication Sig Dispense Refill   amLODipine (NORVASC) 5 MG tablet Take 1 tablet (5 mg total) by mouth daily. 30 tablet 11   apixaban (ELIQUIS) 2.5 MG TABS tablet Take 1 tablet (2.5 mg total) by mouth 2 (two) times daily. 60 tablet 1   buPROPion (WELLBUTRIN XL) 150 MG 24 hr tablet TAKE 1 TABLET BY MOUTH DAILY 90 tablet 3   finasteride (PROSCAR) 5 MG tablet TAKE ONE TABLET BY MOUTH EVERY DAY 90 tablet 3   metolazone (ZAROXOLYN) 2.5 MG tablet Take 2.5 mg by mouth 2 (two) times a week.     Multiple Vitamin (MULTIVITAMIN) capsule Take 1 capsule by mouth daily.     omeprazole (PRILOSEC) 40 MG capsule TAKE 1 CAPSULE BY MOUTH ONCE DAILY 90 capsule 3   potassium chloride (KLOR-CON) 10 MEQ tablet Take 1 tablet (10 mEq total) by mouth 2 (two) times daily for 14 days. 28 tablet 0   pramipexole (MIRAPEX) 1 MG tablet TAKE ONE TABLET AT DINNER AND ONE TABLETAT BEDTIME 180 tablet 3   pyridoxine (B-6) 100 MG tablet Take 100 mg by mouth daily.     tamsulosin (FLOMAX) 0.4 MG CAPS capsule TAKE 1 CAPSULE EVERY DAY 90 capsule 3   acetaminophen (TYLENOL) 325 MG tablet Take 2 tablets (650 mg total) by mouth every 6 (six) hours as needed for mild pain (or Fever >/= 101).     clonazePAM (KLONOPIN) 0.5 MG tablet TAKE ONE TO TWO TABLETS AT BEDTIME 180 tablet 0   furosemide (LASIX) 40 MG tablet Take 1 tablet (40 mg total) by mouth  daily. If home weight is over 175#--take a second dose after lunch 30 tablet 0     Discharge Medications: Please see discharge summary for a list of discharge medications.  Relevant Imaging Results:  Relevant Lab Results:   Additional Information SS#: [id number]  Su Hilt, RN

## 2020-10-01 ENCOUNTER — Ambulatory Visit: Payer: Medicare Other | Admitting: Internal Medicine

## 2020-10-01 ENCOUNTER — Encounter: Payer: Self-pay | Admitting: Internal Medicine

## 2020-10-01 DIAGNOSIS — N179 Acute kidney failure, unspecified: Secondary | ICD-10-CM | POA: Diagnosis not present

## 2020-10-01 DIAGNOSIS — I10 Essential (primary) hypertension: Secondary | ICD-10-CM | POA: Diagnosis not present

## 2020-10-01 DIAGNOSIS — I5032 Chronic diastolic (congestive) heart failure: Secondary | ICD-10-CM

## 2020-10-01 DIAGNOSIS — I480 Paroxysmal atrial fibrillation: Secondary | ICD-10-CM

## 2020-10-01 DIAGNOSIS — N189 Chronic kidney disease, unspecified: Secondary | ICD-10-CM | POA: Diagnosis not present

## 2020-10-01 DIAGNOSIS — N401 Enlarged prostate with lower urinary tract symptoms: Secondary | ICD-10-CM

## 2020-10-01 DIAGNOSIS — N138 Other obstructive and reflux uropathy: Secondary | ICD-10-CM | POA: Diagnosis not present

## 2020-10-01 NOTE — Assessment & Plan Note (Signed)
Compensated better in monitored setting (though he still likes salty snacks) Will continue the furosemide Stop zaroxlyn and losartan and monitor

## 2020-10-01 NOTE — Assessment & Plan Note (Signed)
This seems to be resolved GFR back to about 20---will recheck next week (off zaroxlyn) Will stay off the losartan

## 2020-10-01 NOTE — Assessment & Plan Note (Signed)
BP Readings from Last 3 Encounters:  10/01/20 129/77  09/28/20 (!) 176/102  09/11/20 137/76   Amlodipine started in hospital not given here Will not start---since BP okay--and likely would worsen his edema (the big issue)

## 2020-10-01 NOTE — Progress Notes (Signed)
Subjective:    Patient ID: Stephen Garrett, male    DOB: 05/05/1923, 87 y.o.   MRN: [id number]  HPI Visit in [facility] assisted living apartment for hospital follow up  Reviewed hospital records Reviewed status with Luellen Pucker RN  Was standing in bedroom and had suddenly felt very weak (7/30) No fall Staff helped and called rescue Seen in ER and admitted  Groin pain improved---no DVT AKI noted----losartan held and got some IV fluid Strength did improve and able to make it back to his apartment  No trouble breathing Swelling in legs is not bad No palpitations No SOB  Current Outpatient Medications on File Prior to Visit  Medication Sig Dispense Refill   acetaminophen (TYLENOL) 325 MG tablet Take 2 tablets (650 mg total) by mouth every 6 (six) hours as needed for mild pain (or Fever >/= 101).     apixaban (ELIQUIS) 2.5 MG TABS tablet Take 1 tablet (2.5 mg total) by mouth 2 (two) times daily. 60 tablet 1   buPROPion (WELLBUTRIN XL) 150 MG 24 hr tablet TAKE 1 TABLET BY MOUTH DAILY 90 tablet 3   clonazePAM (KLONOPIN) 0.5 MG tablet TAKE ONE TO TWO TABLETS AT BEDTIME 180 tablet 0   finasteride (PROSCAR) 5 MG tablet TAKE ONE TABLET BY MOUTH EVERY DAY 90 tablet 3   furosemide (LASIX) 40 MG tablet Take 1 tablet (40 mg total) by mouth daily. If home weight is over 175#--take a second dose after lunch 30 tablet 0   metolazone (ZAROXOLYN) 2.5 MG tablet Take 2.5 mg by mouth 2 (two) times a week.     Multiple Vitamin (MULTIVITAMIN) capsule Take 1 capsule by mouth daily.     omeprazole (PRILOSEC) 40 MG capsule TAKE 1 CAPSULE BY MOUTH ONCE DAILY 90 capsule 3   potassium chloride (KLOR-CON) 10 MEQ tablet Take 1 tablet (10 mEq total) by mouth 2 (two) times daily for 14 days. 28 tablet 0   pramipexole (MIRAPEX) 1 MG tablet TAKE ONE TABLET AT DINNER AND ONE TABLETAT BEDTIME 180 tablet 3   pyridoxine (B-6) 100 MG tablet Take 100 mg by mouth daily.     tamsulosin (FLOMAX) 0.4 MG CAPS capsule TAKE 1  CAPSULE EVERY DAY 90 capsule 3   [DISCONTINUED] citalopram (CELEXA) 10 MG tablet Take 1 tablet (10 mg total) by mouth daily. 30 tablet 5   No current facility-administered medications on file prior to visit.    Allergies  Allergen Reactions   Atorvastatin     REACTION: raises blood pressure   Pravastatin Sodium     REACTION: raises blood pressure   Statins     REACTION: raises bp    Past Medical History:  Diagnosis Date   Actinic keratosis    BPH (benign prostatic hypertrophy)    Diseases of lips    Diverticulosis of colon 06/2003   ED (erectile dysfunction)    GERD (gastroesophageal reflux disease)    Heart murmur    as child   HLD (hyperlipidemia)    HOH (hard of hearing)    Cochlear implant right, hearing aid left   HTN (hypertension)    Numbness and tingling in both hands    intermittent, trying braces    Osteoarthritis of knee    severe, bilat   Renal insufficiency    Restless legs syndrome (RLS)    Sleep disturbance, unspecified    Spinal stenosis, lumbar region, without neurogenic claudication     Past Surgical History:  Procedure Laterality Date  CATARACT EXTRACTION, BILATERAL  2011   Dr. Thomasene Ripple   COCHLEAR IMPLANT Right 02/15/2017   Procedure: RIGHT COCHLEAR IMPLANT;  Surgeon: Vicie Mutters, MD;  Location: [place];  Service: ENT;  Laterality: Right;   FINGER SURGERY     Right 4th finger   LIH  10/2009   Dr. Bary Castilla   LUMBAR SPINE SURGERY  06/2005   stenosis repair   PTOSIS REPAIR Bilateral 04/19/2019   Procedure: BLEPHAROPTOSIS REPAIR; RESECT SKIN;  Surgeon: Karle Starch, MD;  Location: [place];  Service: Ophthalmology;  Laterality: Bilateral;   TONSILLECTOMY  childhood   TOTAL KNEE ARTHROPLASTY  06/2008   bilat (Dr. Marry Guan)    Family History  Problem Relation Age of Onset   Other Father        "clogged arteries"   Hypertension Mother     Social History   Socioeconomic History   Marital status: Widowed     Spouse name: Not on file   Number of children: 2   Years of education: Not on file   Highest education level: Not on file  Occupational History   Occupation: Retired  Tobacco Use   Smoking status: Former    Packs/day: 0.75    Years: 30.00    Pack years: 22.50    Types: Cigarettes    Quit date: 02/28/1970    Years since quitting: 50.6   Smokeless tobacco: Never  Vaping Use   Vaping Use: Never used  Substance and Sexual Activity   Alcohol use: Yes    Alcohol/week: 0.0 standard drinks    Comment: 1-2 glasses wine/daily   Drug use: No   Sexual activity: Not on file  Other Topics Concern   Not on file  Social History Narrative   Widowed 2010   2 adopted children   Has living will   Daughter is now health care power of attorney   Would accept resuscitation but no prolonged ventilation   No tube feeds if cognitively unaware   Social Determinants of Health   Financial Resource Strain: Not on file  Food Insecurity: Not on file  Transportation Needs: Not on file  Physical Activity: Not on file  Stress: Not on file  Social Connections: Not on file  Intimate Partner Violence: Not on file   Review of Systems Appetite is good Not sleeping great--but usually okay     Objective:   Physical Exam Constitutional:      Appearance: Normal appearance.  Cardiovascular:     Rate and Rhythm: Normal rate and regular rhythm.     Heart sounds: No murmur heard.   No gallop.  Pulmonary:     Effort: Pulmonary effort is normal.     Breath sounds: Normal breath sounds. No wheezing or rales.  Musculoskeletal:     Comments: Calves tense but without significant swelling (slight on left and none on right) No pitting  Skin:    Comments: Small superficial granulating ulcer on left calf  Neurological:     Mental Status: He is alert.           Assessment & Plan:

## 2020-10-01 NOTE — Assessment & Plan Note (Signed)
Still regular now

## 2020-10-01 NOTE — Assessment & Plan Note (Signed)
Voids okay on the tamsulosin

## 2020-10-15 DIAGNOSIS — N189 Chronic kidney disease, unspecified: Secondary | ICD-10-CM | POA: Diagnosis not present

## 2020-11-06 ENCOUNTER — Encounter: Payer: Self-pay | Admitting: Internal Medicine

## 2020-11-06 ENCOUNTER — Other Ambulatory Visit: Payer: Self-pay

## 2020-11-06 ENCOUNTER — Non-Acute Institutional Stay: Payer: Medicare Other | Admitting: Internal Medicine

## 2020-11-06 VITALS — BP 143/79 | HR 70 | Resp 18 | Wt 168.4 lb

## 2020-11-06 DIAGNOSIS — Z6826 Body mass index (BMI) 26.0-26.9, adult: Secondary | ICD-10-CM | POA: Diagnosis not present

## 2020-11-06 DIAGNOSIS — I5032 Chronic diastolic (congestive) heart failure: Secondary | ICD-10-CM | POA: Diagnosis not present

## 2020-11-06 DIAGNOSIS — I872 Venous insufficiency (chronic) (peripheral): Secondary | ICD-10-CM

## 2020-11-06 DIAGNOSIS — M25572 Pain in left ankle and joints of left foot: Secondary | ICD-10-CM | POA: Diagnosis not present

## 2020-11-06 DIAGNOSIS — N184 Chronic kidney disease, stage 4 (severe): Secondary | ICD-10-CM | POA: Diagnosis not present

## 2020-11-06 DIAGNOSIS — M25571 Pain in right ankle and joints of right foot: Secondary | ICD-10-CM | POA: Diagnosis not present

## 2020-11-06 DIAGNOSIS — E663 Overweight: Secondary | ICD-10-CM | POA: Diagnosis not present

## 2020-11-06 DIAGNOSIS — G8929 Other chronic pain: Secondary | ICD-10-CM

## 2020-11-06 NOTE — Progress Notes (Signed)
Subjective:    Patient ID: Stephen Garrett, male    DOB: [birth year], 87 y.o.   MRN: [id number]  HPI  Asked to see resident in [address] C/o bilateral ankle pain. He describes the pain as sore and achy. He reports the pain is not interfering with his ability to walk. RN reports he is very active on a daily basis. He does have some ankle swelling but also has BLE due to CHF. RN also reports redness, scaliness and open areas to BLE. He is currently taking Tylenol 3 x day for joint pain and Furosemide 80 mg daily for CHF. He does have a history of CKD last creatinine 2.74, GFR 21, 09/2020. RN reports he constantly picks at his lower extremities. This has improved with applying Jergens lotion daily.   Review of Systems  Past Medical History:  Diagnosis Date   Actinic keratosis    BPH (benign prostatic hypertrophy)    Diseases of lips    Diverticulosis of colon 06/2003   ED (erectile dysfunction)    GERD (gastroesophageal reflux disease)    Heart murmur    as child   HLD (hyperlipidemia)    HOH (hard of hearing)    Cochlear implant right, hearing aid left   HTN (hypertension)    Numbness and tingling in both hands    intermittent, trying braces    Osteoarthritis of knee    severe, bilat   Renal insufficiency    Restless legs syndrome (RLS)    Sleep disturbance, unspecified    Spinal stenosis, lumbar region, without neurogenic claudication     Current Outpatient Medications  Medication Sig Dispense Refill   acetaminophen (TYLENOL) 325 MG tablet Take 2 tablets (650 mg total) by mouth every 6 (six) hours as needed for mild pain (or Fever >/= 101).     apixaban (ELIQUIS) 2.5 MG TABS tablet Take 1 tablet (2.5 mg total) by mouth 2 (two) times daily. 60 tablet 1   buPROPion (WELLBUTRIN XL) 150 MG 24 hr tablet TAKE 1 TABLET BY MOUTH DAILY 90 tablet 3   clonazePAM (KLONOPIN) 0.5 MG tablet TAKE ONE TO TWO TABLETS AT BEDTIME 180 tablet 0   finasteride (PROSCAR) 5 MG tablet TAKE ONE TABLET BY  MOUTH EVERY DAY 90 tablet 3   furosemide (LASIX) 40 MG tablet Take 1 tablet (40 mg total) by mouth daily. If home weight is over 175#--take a second dose after lunch 30 tablet 0   Multiple Vitamin (MULTIVITAMIN) capsule Take 1 capsule by mouth daily.     omeprazole (PRILOSEC) 40 MG capsule TAKE 1 CAPSULE BY MOUTH ONCE DAILY 90 capsule 3   potassium chloride (KLOR-CON) 10 MEQ tablet Take 1 tablet (10 mEq total) by mouth 2 (two) times daily for 14 days. 28 tablet 0   pramipexole (MIRAPEX) 1 MG tablet TAKE ONE TABLET AT DINNER AND ONE TABLETAT BEDTIME 180 tablet 3   pyridoxine (B-6) 100 MG tablet Take 100 mg by mouth daily.     tamsulosin (FLOMAX) 0.4 MG CAPS capsule TAKE 1 CAPSULE EVERY DAY 90 capsule 3   No current facility-administered medications for this visit.    Allergies  Allergen Reactions   Atorvastatin     REACTION: raises blood pressure   Pravastatin Sodium     REACTION: raises blood pressure   Statins     REACTION: raises bp    Family History  Problem Relation Age of Onset   Other Father        "clogged  arteries"   Hypertension Mother     Social History   Socioeconomic History   Marital status: Widowed    Spouse name: Not on file   Number of children: 2   Years of education: Not on file   Highest education level: Not on file  Occupational History   Occupation: Retired  Tobacco Use   Smoking status: Former    Packs/day: 0.75    Years: 30.00    Pack years: 22.50    Types: Cigarettes    Quit date: 02/28/1970    Years since quitting: 50.7   Smokeless tobacco: Never  Vaping Use   Vaping Use: Never used  Substance and Sexual Activity   Alcohol use: Yes    Alcohol/week: 0.0 standard drinks    Comment: 1-2 glasses wine/daily   Drug use: No   Sexual activity: Not on file  Other Topics Concern   Not on file  Social History Narrative   Widowed 2010   2 adopted children   Has living will   Daughter is now health care power of attorney   Would accept  resuscitation but no prolonged ventilation   No tube feeds if cognitively unaware   Social Determinants of Health   Financial Resource Strain: Not on file  Food Insecurity: Not on file  Transportation Needs: Not on file  Physical Activity: Not on file  Stress: Not on file  Social Connections: Not on file  Intimate Partner Violence: Not on file     Constitutional: Denies fever, malaise, fatigue, headache or abrupt weight changes.  Respiratory: Denies difficulty breathing, shortness of breath, cough or sputum production.   Cardiovascular: Pt reports swelling of his BLE.  Denies chest pain, chest tightness, palpitations or swelling in the hands.  Musculoskeletal: Pt reports bilateral ankle pain. Denies decrease in range of motion, difficulty with gait, muscle pain or joint swelling.  Skin: RN reports open areas to BLE from excoriation. Denies redness, rashes, lesions.   No other specific complaints in a complete review of systems (except as listed in HPI above).     Objective:   Physical Exam  BP (!) 143/79   Pulse 70   Resp 18   Wt 168 lb 6.4 oz (76.4 kg)   BMI 26.38 kg/m  Wt Readings from Last 3 Encounters:  11/06/20 168 lb 6.4 oz (76.4 kg)  10/01/20 166 lb 8 oz (75.5 kg)  09/26/20 171 lb 1.2 oz (77.6 kg)    General: Appears his stated age, overweight, in NAD. Skin: Venous stasis changes noted to BLE. Open area covered with Bandaid to left anterior shin. Neck:  No JVD. Cardiovascular: Normal rate and rhythm. Murmur noted. 1+ edema to BLE, L> R. Pulmonary/Chest: Normal effort and positive vesicular breath sounds. No respiratory distress. No wheezes, rales or ronchi noted.  Musculoskeletal: Normal flexion, extension and rotation of bilateral ankles. Pain with palpation over bilateral medial and lateral malleoli. No joint swelling noted. Gait slow and steady with use of walker. Neurological: Alert and oriented.    BMET    Component Value Date/Time   NA 140 09/28/2020  0419   K 4.0 09/28/2020 0419   CL 102 09/28/2020 0419   CO2 30 09/28/2020 0419   GLUCOSE 120 (H) 09/28/2020 0419   BUN 77 (H) 09/28/2020 0419   CREATININE 2.61 (H) 09/28/2020 0419   CALCIUM 9.6 09/28/2020 0419   GFRNONAA 22 (L) 09/28/2020 0419   GFRAA 55 (L) 09/29/2017 0428    Lipid Panel  Component Value Date/Time   CHOL 199 01/01/2020 2252   TRIG 70 01/01/2020 2252   HDL 37 (L) 01/01/2020 2252   CHOLHDL 5.4 01/01/2020 2252   VLDL 14 01/01/2020 2252   LDLCALC 148 (H) 01/01/2020 2252    CBC    Component Value Date/Time   WBC 6.8 09/28/2020 0419   RBC 3.48 (L) 09/28/2020 0419   HGB 10.6 (L) 09/28/2020 0419   HCT 33.4 (L) 09/28/2020 0419   PLT 150 09/28/2020 0419   MCV 96.0 09/28/2020 0419   MCH 30.5 09/28/2020 0419   MCHC 31.7 09/28/2020 0419   RDW 15.7 (H) 09/28/2020 0419   LYMPHSABS 1.3 09/28/2020 0419   MONOABS 0.4 09/28/2020 0419   EOSABS 0.0 09/28/2020 0419   BASOSABS 0.0 09/28/2020 0419    Hgb A1C No results found for: HGBA1C          Assessment & Plan:   CVI with Venous Stasis Changes to BLE, CHF, CKD:  Would not want to increase Furosemide given his kidney function No indication to change Furosemide to Torsemide at this time Stop Jergens and start Sarna lotion BID Encouraged elevation, compression hose Could consider Unna Boots if symptoms persist/worsen  Bilateral Ankle Pain:  Advised him this is likely just arthritis and the fact that he is so active on a daily basis He would like further evaluation with xrays today if possible Xray bilateral ankles ordered Continue Tylenol 650 mg 3 x day   Will follow up after xrays, return precautions discussed

## 2020-11-06 NOTE — Patient Instructions (Signed)
Ankle Exercises Ask your health care provider which exercises are safe for you. Do exercises exactly as told by your health care provider and adjust them as directed. It is normal to feel mild stretching, pulling, tightness, or mild discomfort as you do these exercises. Stop right away if you feel sudden pain or your pain gets worse. Do not begin these exercises until told by your health care provider. Stretching and range-of-motion exercises These exercises warm up your muscles and joints and improve the movement and flexibility of your ankle. These exercises may also help to relieve pain. Dorsiflexion/plantar flexion  Sit with your __________ knee straight or bent. Do not rest your foot on anything. Flex your __________ ankle to tilt the top of your foot toward your shin. This is called dorsiflexion. Hold this position for __________ seconds. Point your toes downward to tilt the top of your foot away from your shin. This is called plantar flexion. Hold this position for __________ seconds. Repeat __________ times. Complete this exercise __________ times a day. Ankle alphabet  Sit with your __________ foot supported at your lower leg. Do not rest your foot on anything. Make sure your foot has room to move freely. Think of your __________ foot as a paintbrush: Move your foot to trace each letter of the alphabet in the air. Keep your hip and knee still while you trace the letters. Make the letters as large as you can without causing or increasing any discomfort. Repeat __________ times. Complete this exercise __________ times a day. Passive ankle dorsiflexion This is an exercise in which something or someone moves your ankle for you. You do not move it yourself. Sit on a chair that is placed on a non-carpeted surface. Place your __________ foot on the floor, directly under your __________ knee. Extend your __________ leg for support. Keeping your heel down, slide your __________ foot back  toward the chair until you feel a stretch at your ankle or calf. If you do not feel a stretch, slide your buttocks forward to the edge of the chair while keeping your heel down. Hold this stretch for __________ seconds. Repeat __________ times. Complete this exercise __________ times a day. Strengthening exercises These exercises build strength and endurance in your ankle. Endurance is the ability to use your muscles for a long time, even after they get tired. Dorsiflexors These are muscles that lift your foot up. Secure a rubber exercise band or tube to an object, such as a table leg, that will stay still when the band is pulled. Secure the other end around your __________ foot. Sit on the floor, facing the object with your __________ leg extended. The band or tube should be slightly tense when your foot is relaxed. Slowly flex your __________ ankle and toes to bring your foot toward your shin. Hold this position for __________ seconds. Slowly return your foot to the starting position, controlling the band as you do that. Repeat __________ times. Complete this exercise __________ times a day. Plantar flexors These are muscles that push your foot down. Sit on the floor with your __________ leg extended. Loop a rubber exercise band or tube around the ball of your __________ foot. The ball of your foot is on the walking surface, right under your toes. The band or tube should be slightly tense when your foot is relaxed. Slowly point your toes downward, pushing them away from you. Hold this position for __________ seconds. Slowly release the tension in the band or tube, controlling  smoothly until your foot is back in the starting position. Repeat __________ times. Complete this exercise __________ times a day. Towel curls  Sit in a chair on a non-carpeted surface, and put your feet on the floor. Place a towel in front of your feet. If told by your health care provider, add a __________ lb / kg  weight to the end of the towel. Keeping your heel on the floor, put your __________ foot on the towel. Pull the towel toward you by grabbing the towel with your toes and curling them under. Keep your heel on the floor. Let your toes relax. Grab the towel again. Keep pulling the towel until it is completely underneath your foot. Repeat __________ times. Complete this exercise __________ times a day. Standing plantar flexion This is an exercise in which you use your toes to lift your body's weight while standing. Stand with your feet shoulder-width apart. Keep your weight spread evenly over the width of your feet while you rise up on your toes. Use a wall or table to steady yourself if needed, but try not to use it for support. If this exercise is too easy, try these options: Shift your weight toward your __________ leg until you feel challenged. If told by your health care provider, lift your uninjured leg off the floor. Hold this position for __________ seconds. Repeat __________ times. Complete this exercise __________ times a day. Tandem walking Stand with one foot directly in front of the other. Slowly raise your back foot up, lifting your heel before your toes, and place it directly in front of your other foot. Continue to walk in this heel-to-toe way for __________ or for as long as told by your health care provider. Have a countertop or wall nearby to use if needed to keep your balance, but try not to hold onto anything for support. Repeat __________ times. Complete this exercise __________ times a day. This information is not intended to replace advice given to you by your health care provider. Make sure you discuss any questions you have with your health care provider. Document Revised: 04/07/2020 Document Reviewed: 04/07/2020 Elsevier Patient Education  [place].

## 2020-11-13 DIAGNOSIS — R600 Localized edema: Secondary | ICD-10-CM | POA: Diagnosis not present

## 2020-11-13 DIAGNOSIS — I872 Venous insufficiency (chronic) (peripheral): Secondary | ICD-10-CM | POA: Diagnosis not present

## 2020-11-13 DIAGNOSIS — X32XXXA Exposure to sunlight, initial encounter: Secondary | ICD-10-CM | POA: Diagnosis not present

## 2020-11-13 DIAGNOSIS — L570 Actinic keratosis: Secondary | ICD-10-CM | POA: Diagnosis not present

## 2020-11-20 DIAGNOSIS — Z23 Encounter for immunization: Secondary | ICD-10-CM | POA: Diagnosis not present

## 2020-11-25 ENCOUNTER — Non-Acute Institutional Stay: Payer: Medicare Other | Admitting: Internal Medicine

## 2020-11-25 DIAGNOSIS — I10 Essential (primary) hypertension: Secondary | ICD-10-CM

## 2020-11-25 DIAGNOSIS — I872 Venous insufficiency (chronic) (peripheral): Secondary | ICD-10-CM

## 2020-11-25 DIAGNOSIS — I480 Paroxysmal atrial fibrillation: Secondary | ICD-10-CM | POA: Diagnosis not present

## 2020-11-25 DIAGNOSIS — N184 Chronic kidney disease, stage 4 (severe): Secondary | ICD-10-CM

## 2020-11-25 DIAGNOSIS — G2581 Restless legs syndrome: Secondary | ICD-10-CM

## 2020-11-25 DIAGNOSIS — D631 Anemia in chronic kidney disease: Secondary | ICD-10-CM

## 2020-11-25 DIAGNOSIS — M19071 Primary osteoarthritis, right ankle and foot: Secondary | ICD-10-CM | POA: Diagnosis not present

## 2020-11-25 DIAGNOSIS — N401 Enlarged prostate with lower urinary tract symptoms: Secondary | ICD-10-CM | POA: Diagnosis not present

## 2020-11-25 DIAGNOSIS — K219 Gastro-esophageal reflux disease without esophagitis: Secondary | ICD-10-CM | POA: Diagnosis not present

## 2020-11-25 DIAGNOSIS — I5032 Chronic diastolic (congestive) heart failure: Secondary | ICD-10-CM | POA: Diagnosis not present

## 2020-11-25 DIAGNOSIS — F39 Unspecified mood [affective] disorder: Secondary | ICD-10-CM

## 2020-11-25 DIAGNOSIS — M19072 Primary osteoarthritis, left ankle and foot: Secondary | ICD-10-CM

## 2020-11-25 DIAGNOSIS — F0150 Vascular dementia without behavioral disturbance: Secondary | ICD-10-CM | POA: Diagnosis not present

## 2020-11-25 DIAGNOSIS — N138 Other obstructive and reflux uropathy: Secondary | ICD-10-CM

## 2020-11-25 DIAGNOSIS — G459 Transient cerebral ischemic attack, unspecified: Secondary | ICD-10-CM

## 2020-11-25 DIAGNOSIS — F5102 Adjustment insomnia: Secondary | ICD-10-CM

## 2020-11-26 ENCOUNTER — Encounter: Payer: Self-pay | Admitting: Internal Medicine

## 2020-11-26 DIAGNOSIS — G4700 Insomnia, unspecified: Secondary | ICD-10-CM | POA: Insufficient documentation

## 2020-11-26 MED ORDER — TRAZODONE HCL 50 MG PO TABS: 25.0000 mg | ORAL_TABLET | Freq: Every evening | ORAL | 3 refills | Status: AC | PRN

## 2020-11-26 NOTE — Assessment & Plan Note (Signed)
Compensated on Furosemide Monitor daily weights

## 2020-11-26 NOTE — Assessment & Plan Note (Signed)
Continue Omeprazole ?

## 2020-11-26 NOTE — Assessment & Plan Note (Signed)
Continue Mirapex.

## 2020-11-26 NOTE — Assessment & Plan Note (Signed)
Continue Tylenol as scheduled Encourage regular physical activity

## 2020-11-26 NOTE — Assessment & Plan Note (Signed)
No beta-blocker secondary to bradycardia Continue Eliquis

## 2020-11-26 NOTE — Patient Instructions (Signed)
Insomnia Insomnia is a sleep disorder that makes it difficult to fall asleep or stay asleep. Insomnia can cause fatigue, low energy, difficulty concentrating, moodswings, and poor performance at work or school. There are three different ways to classify insomnia: Difficulty falling asleep. Difficulty staying asleep. Waking up too early in the morning. Any type of insomnia can be long-term (chronic) or short-term (acute). Both are common. Short-term insomnia usually lasts for three months or less. Chronic insomnia occurs at least three times a week for longer than threemonths. What are the causes? Insomnia may be caused by another condition, situation, or substance, such as: Anxiety. Certain medicines. Gastroesophageal reflux disease (GERD) or other gastrointestinal conditions. Asthma or other breathing conditions. Restless legs syndrome, sleep apnea, or other sleep disorders. Chronic pain. Menopause. Stroke. Abuse of alcohol, tobacco, or illegal drugs. Mental health conditions, such as depression. Caffeine. Neurological disorders, such as Alzheimer's disease. An overactive thyroid (hyperthyroidism). Sometimes, the cause of insomnia may not be known. What increases the risk? Risk factors for insomnia include: Gender. Women are affected more often than men. Age. Insomnia is more common as you get older. Stress. Lack of exercise. Irregular work schedule or working night shifts. Traveling between different time zones. Certain medical and mental health conditions. What are the signs or symptoms? If you have insomnia, the main symptom is having trouble falling asleep or having trouble staying asleep. This may lead to other symptoms, such as: Feeling fatigued or having low energy. Feeling nervous about going to sleep. Not feeling rested in the morning. Having trouble concentrating. Feeling irritable, anxious, or depressed. How is this diagnosed? This condition may be diagnosed based  on: Your symptoms and medical history. Your health care provider may ask about: Your sleep habits. Any medical conditions you have. Your mental health. A physical exam. How is this treated? Treatment for insomnia depends on the cause. Treatment may focus on treating an underlying condition that is causing insomnia. Treatment may also include: Medicines to help you sleep. Counseling or therapy. Lifestyle adjustments to help you sleep better. Follow these instructions at home: Eating and drinking  Limit or avoid alcohol, caffeinated beverages, and cigarettes, especially close to bedtime. These can disrupt your sleep. Do not eat a large meal or eat spicy foods right before bedtime. This can lead to digestive discomfort that can make it hard for you to sleep.  Sleep habits  Keep a sleep diary to help you and your health care provider figure out what could be causing your insomnia. Write down: When you sleep. When you wake up during the night. How well you sleep. How rested you feel the next day. Any side effects of medicines you are taking. What you eat and drink. Make your bedroom a dark, comfortable place where it is easy to fall asleep. Put up shades or blackout curtains to block light from outside. Use a white noise machine to block noise. Keep the temperature cool. Limit screen use before bedtime. This includes: Watching TV. Using your smartphone, tablet, or computer. Stick to a routine that includes going to bed and waking up at the same times every day and night. This can help you fall asleep faster. Consider making a quiet activity, such as reading, part of your nighttime routine. Try to avoid taking naps during the day so that you sleep better at night. Get out of bed if you are still awake after 15 minutes of trying to sleep. Keep the lights down, but try reading or doing a quiet   activity. When you feel sleepy, go back to bed.  General instructions Take over-the-counter  and prescription medicines only as told by your health care provider. Exercise regularly, as told by your health care provider. Avoid exercise starting several hours before bedtime. Use relaxation techniques to manage stress. Ask your health care provider to suggest some techniques that may work well for you. These may include: Breathing exercises. Routines to release muscle tension. Visualizing peaceful scenes. Make sure that you drive carefully. Avoid driving if you feel very sleepy. Keep all follow-up visits as told by your health care provider. This is important. Contact a health care provider if: You are tired throughout the day. You have trouble in your daily routine due to sleepiness. You continue to have sleep problems, or your sleep problems get worse. Get help right away if: You have serious thoughts about hurting yourself or someone else. If you ever feel like you may hurt yourself or others, or have thoughts about taking your own life, get help right away. You can go to your nearest emergency department or call: Your local emergency services (911 in the [place] A suicide crisis helpline, such as the National Suicide Prevention Lifeline at 1-800-273-8255. This is open 24 hours a day. Summary Insomnia is a sleep disorder that makes it difficult to fall asleep or stay asleep. Insomnia can be long-term (chronic) or short-term (acute). Treatment for insomnia depends on the cause. Treatment may focus on treating an underlying condition that is causing insomnia. Keep a sleep diary to help you and your health care provider figure out what could be causing your insomnia. This information is not intended to replace advice given to you by your health care provider. Make sure you discuss any questions you have with your healthcare provider. Document Revised: 12/26/2019 Document Reviewed: 12/26/2019 Elsevier Patient Education  2022 Elsevier Inc.  

## 2020-11-26 NOTE — Assessment & Plan Note (Signed)
Continue Eliquis Appreciate ALF care

## 2020-11-26 NOTE — Progress Notes (Signed)
Subjective:    Patient ID: Stephen Garrett, male    DOB: 10-12-1923, 87 y.o.   MRN: [id number]  HPI  Resident seen in [address] Reviewed with RN, no new concerns Resident reports he is not sleeping well.  He is having difficulty falling asleep and staying asleep.  He is requesting a medication to help him sleep at this time. He walks with a walker.  He is independent with ADLs.  His appetite is good, weight is up 3 pounds.  He denies issues with his bowel or bladder.  He does report some pain in his ankles, controlled with medication.  He reports his mood is pretty good.  Afib: Managed on Eliquis.  He is not on a beta-blocker secondary to bradycardia.  Anemia: His last H/H was 10.6/33.4, 09/2020.  He is not taking oral iron at this time.  BPH: He denies issues on Finasteride and Flomax.  CHF: Some swelling to the RLE but denies chronic cough or shortness of breath.  He is taking Furosemide as prescribed.  Echocardiogram from 01/2020 reviewed.  CKD 4: His last creatinine was 2.61, GFR 22.  He is not currently on an ACEI/ARB.  Mod Disorder: Chronic, managed on Wellbutrin and Clonazepam.  HTN: His BP today is 140/89.  He is not taking any antihypertensive medication at this time.  ECG from 08/2020 reviewed.  GERD: He denies breakthrough on omeprazole.  OA: Mainly in his ankles.  He is taking Tylenol as scheduled with some relief of symptoms.  RLS: Managed with Mirapex.  HLD with Hx of TIA: His last LDL was 148, triglycerides 70, 12/2019.  He is not taking any cholesterol-lowering medication at this time.  Vascular Dementia: Mild cognitive issues.  He is taking Eliquis as prescribed.  CVI with Venous Stasis RLE: Managed with furosemide and Eliquis.  Review of Systems     Past Medical History:  Diagnosis Date   Actinic keratosis    BPH (benign prostatic hypertrophy)    Diseases of lips    Diverticulosis of colon 06/2003   ED (erectile dysfunction)    GERD (gastroesophageal  reflux disease)    Heart murmur    as child   HLD (hyperlipidemia)    HOH (hard of hearing)    Cochlear implant right, hearing aid left   HTN (hypertension)    Numbness and tingling in both hands    intermittent, trying braces    Osteoarthritis of knee    severe, bilat   Renal insufficiency    Restless legs syndrome (RLS)    Sleep disturbance, unspecified    Spinal stenosis, lumbar region, without neurogenic claudication     Current Outpatient Medications  Medication Sig Dispense Refill   acetaminophen (TYLENOL) 325 MG tablet Take 2 tablets (650 mg total) by mouth every 6 (six) hours as needed for mild pain (or Fever >/= 101).     apixaban (ELIQUIS) 2.5 MG TABS tablet Take 1 tablet (2.5 mg total) by mouth 2 (two) times daily. 60 tablet 1   buPROPion (WELLBUTRIN XL) 150 MG 24 hr tablet TAKE 1 TABLET BY MOUTH DAILY 90 tablet 3   clonazePAM (KLONOPIN) 0.5 MG tablet TAKE ONE TO TWO TABLETS AT BEDTIME 180 tablet 0   finasteride (PROSCAR) 5 MG tablet TAKE ONE TABLET BY MOUTH EVERY DAY 90 tablet 3   furosemide (LASIX) 40 MG tablet Take 1 tablet (40 mg total) by mouth daily. If home weight is over 175#--take a second dose after lunch 30 tablet  0   furosemide (LASIX) 80 MG tablet Take 80 mg by mouth daily.     Multiple Vitamin (MULTIVITAMIN) capsule Take 1 capsule by mouth daily.     omeprazole (PRILOSEC) 40 MG capsule TAKE 1 CAPSULE BY MOUTH ONCE DAILY 90 capsule 3   potassium chloride (KLOR-CON) 10 MEQ tablet Take 1 tablet (10 mEq total) by mouth 2 (two) times daily for 14 days. 28 tablet 0   pramipexole (MIRAPEX) 1 MG tablet TAKE ONE TABLET AT DINNER AND ONE TABLETAT BEDTIME 180 tablet 3   pyridoxine (B-6) 100 MG tablet Take 100 mg by mouth daily.     tamsulosin (FLOMAX) 0.4 MG CAPS capsule TAKE 1 CAPSULE EVERY DAY 90 capsule 3   No current facility-administered medications for this visit.    Allergies  Allergen Reactions   Atorvastatin     REACTION: raises blood pressure    Pravastatin Sodium     REACTION: raises blood pressure   Statins     REACTION: raises bp    Family History  Problem Relation Age of Onset   Other Father        "clogged arteries"   Hypertension Mother     Social History   Socioeconomic History   Marital status: Widowed    Spouse name: Not on file   Number of children: 2   Years of education: Not on file   Highest education level: Not on file  Occupational History   Occupation: Retired  Tobacco Use   Smoking status: Former    Packs/day: 0.75    Years: 30.00    Pack years: 22.50    Types: Cigarettes    Quit date: 02/28/1970    Years since quitting: 50.7   Smokeless tobacco: Never  Vaping Use   Vaping Use: Never used  Substance and Sexual Activity   Alcohol use: Yes    Alcohol/week: 0.0 standard drinks    Comment: 1-2 glasses wine/daily   Drug use: No   Sexual activity: Not on file  Other Topics Concern   Not on file  Social History Narrative   Widowed 2010   2 adopted children   Has living will   Daughter is now health care power of attorney   Would accept resuscitation but no prolonged ventilation   No tube feeds if cognitively unaware   Social Determinants of Health   Financial Resource Strain: Not on file  Food Insecurity: Not on file  Transportation Needs: Not on file  Physical Activity: Not on file  Stress: Not on file  Social Connections: Not on file  Intimate Partner Violence: Not on file     Constitutional: Denies fever, malaise, fatigue, headache or abrupt weight changes.  HEENT: Denies eye pain, eye redness, ear pain, ringing in the ears, wax buildup, runny nose, nasal congestion, bloody nose, or sore throat. Respiratory: Denies difficulty breathing, shortness of breath, cough or sputum production.   Cardiovascular: Patient reports swelling to his right leg.  Denies chest pain, chest tightness, palpitations or swelling in the hands.  Gastrointestinal: Denies abdominal pain, bloating,  constipation, diarrhea or blood in the stool.  GU: Denies urgency, frequency, pain with urination, burning sensation, blood in urine, odor or discharge. Musculoskeletal: Patient reports bilateral ankle pain.  Denies decrease in range of motion, muscle pain or joint swelling.  Skin: Patient reports redness to RLE.  Denies redness, rashes, lesions or ulcercations.  Neurological: Patient reports difficulty with memory.  Denies dizziness, difficulty with speech or problems with balance and  coordination.  Psych: Patient has a history of anxiety and depression.  Denies SI/HI.  No other specific complaints in a complete review of systems (except as listed in HPI above).  Objective:   Physical Exam   BP 140/89   Pulse (!) 46   Temp (!) 97.5 F (36.4 C)   Resp 18   Wt 171 lb 3.2 oz (77.7 kg)   BMI 26.81 kg/m  Wt Readings from Last 3 Encounters:  11/26/20 171 lb 3.2 oz (77.7 kg)  11/06/20 168 lb 6.4 oz (76.4 kg)  10/01/20 166 lb 8 oz (75.5 kg)    General: Appears his stated age, chronically ill-appearing, in NAD. Skin: Warm, dry and intact.  Venous stasis noted of RLE. HEENT: Head: normal shape and size;  Ears: HOH. Cardiovascular: Bradycardic with irregular rhythm.  Trace RLE edema noted Pulmonary/Chest: Normal effort and positive vesicular breath sounds. No respiratory distress. No wheezes, rales or ronchi noted.  Abdomen: Soft and nontender. Normal bowel sounds.  Musculoskeletal: Gait slow and steady with use of walker. Neurological: Alert and oriented.  Psychiatric: Mood and affect mildly flat. Behavior is normal. Judgment and thought content normal.     BMET    Component Value Date/Time   NA 140 09/28/2020 0419   K 4.0 09/28/2020 0419   CL 102 09/28/2020 0419   CO2 30 09/28/2020 0419   GLUCOSE 120 (H) 09/28/2020 0419   BUN 77 (H) 09/28/2020 0419   CREATININE 2.61 (H) 09/28/2020 0419   CALCIUM 9.6 09/28/2020 0419   GFRNONAA 22 (L) 09/28/2020 0419   GFRAA 55 (L) 09/29/2017  0428    Lipid Panel     Component Value Date/Time   CHOL 199 01/01/2020 2252   TRIG 70 01/01/2020 2252   HDL 37 (L) 01/01/2020 2252   CHOLHDL 5.4 01/01/2020 2252   VLDL 14 01/01/2020 2252   LDLCALC 148 (H) 01/01/2020 2252    CBC    Component Value Date/Time   WBC 6.8 09/28/2020 0419   RBC 3.48 (L) 09/28/2020 0419   HGB 10.6 (L) 09/28/2020 0419   HCT 33.4 (L) 09/28/2020 0419   PLT 150 09/28/2020 0419   MCV 96.0 09/28/2020 0419   MCH 30.5 09/28/2020 0419   MCHC 31.7 09/28/2020 0419   RDW 15.7 (H) 09/28/2020 0419   LYMPHSABS 1.3 09/28/2020 0419   MONOABS 0.4 09/28/2020 0419   EOSABS 0.0 09/28/2020 0419   BASOSABS 0.0 09/28/2020 0419    Hgb A1C No results found for: HGBA1C         Assessment & Plan:   Webb Silversmith, NP This visit occurred during the SARS-CoV-2 public health emergency.  Safety protocols were in place, including screening questions prior to the visit, additional usage of staff PPE, and extensive cleaning of exam room while observing appropriate contact time as indicated for disinfecting solutions.

## 2020-11-26 NOTE — Assessment & Plan Note (Signed)
Currently not on statin Continue Eliquis

## 2020-11-26 NOTE — Assessment & Plan Note (Signed)
We will check BMET in November

## 2020-11-26 NOTE — Assessment & Plan Note (Signed)
Continue Furosemide Encourage low-salt diet Can wear compression hose as needed

## 2020-11-26 NOTE — Assessment & Plan Note (Signed)
We will trial low-dose Trazodone

## 2020-11-26 NOTE — Assessment & Plan Note (Signed)
Continue Finasteride and Flomax

## 2020-11-26 NOTE — Assessment & Plan Note (Signed)
Continue Wellbutrin and Clonazepam Support offered

## 2020-11-26 NOTE — Assessment & Plan Note (Signed)
Currently not taking any antihypertensive medications Will monitor

## 2020-11-26 NOTE — Assessment & Plan Note (Signed)
We will monitor CBC yearly ?

## 2020-12-02 ENCOUNTER — Encounter: Payer: Self-pay | Admitting: Emergency Medicine

## 2020-12-02 ENCOUNTER — Other Ambulatory Visit: Payer: Self-pay

## 2020-12-02 ENCOUNTER — Observation Stay
Admission: EM | Admit: 2020-12-02 | Discharge: 2020-12-04 | Disposition: A | Discharge status: Home / Self Care | Payer: Medicare Other | Attending: Internal Medicine | Admitting: Internal Medicine

## 2020-12-02 DIAGNOSIS — W1839XA Other fall on same level, initial encounter: Secondary | ICD-10-CM | POA: Insufficient documentation

## 2020-12-02 DIAGNOSIS — I10 Essential (primary) hypertension: Secondary | ICD-10-CM | POA: Diagnosis present

## 2020-12-02 DIAGNOSIS — E876 Hypokalemia: Secondary | ICD-10-CM | POA: Diagnosis present

## 2020-12-02 DIAGNOSIS — R0902 Hypoxemia: Secondary | ICD-10-CM | POA: Diagnosis not present

## 2020-12-02 DIAGNOSIS — G459 Transient cerebral ischemic attack, unspecified: Secondary | ICD-10-CM | POA: Diagnosis present

## 2020-12-02 DIAGNOSIS — Z8673 Personal history of transient ischemic attack (TIA), and cerebral infarction without residual deficits: Secondary | ICD-10-CM | POA: Diagnosis not present

## 2020-12-02 DIAGNOSIS — I517 Cardiomegaly: Secondary | ICD-10-CM | POA: Diagnosis not present

## 2020-12-02 DIAGNOSIS — R0600 Dyspnea, unspecified: Secondary | ICD-10-CM | POA: Diagnosis not present

## 2020-12-02 DIAGNOSIS — Z7901 Long term (current) use of anticoagulants: Secondary | ICD-10-CM | POA: Insufficient documentation

## 2020-12-02 DIAGNOSIS — I5030 Unspecified diastolic (congestive) heart failure: Secondary | ICD-10-CM | POA: Diagnosis not present

## 2020-12-02 DIAGNOSIS — G2581 Restless legs syndrome: Secondary | ICD-10-CM | POA: Diagnosis present

## 2020-12-02 DIAGNOSIS — F0390 Unspecified dementia without behavioral disturbance: Secondary | ICD-10-CM | POA: Diagnosis not present

## 2020-12-02 DIAGNOSIS — E785 Hyperlipidemia, unspecified: Secondary | ICD-10-CM | POA: Diagnosis present

## 2020-12-02 DIAGNOSIS — N184 Chronic kidney disease, stage 4 (severe): Secondary | ICD-10-CM | POA: Diagnosis present

## 2020-12-02 DIAGNOSIS — Z96653 Presence of artificial knee joint, bilateral: Secondary | ICD-10-CM | POA: Diagnosis not present

## 2020-12-02 DIAGNOSIS — W19XXXA Unspecified fall, initial encounter: Secondary | ICD-10-CM

## 2020-12-02 DIAGNOSIS — R609 Edema, unspecified: Secondary | ICD-10-CM | POA: Diagnosis not present

## 2020-12-02 DIAGNOSIS — M47812 Spondylosis without myelopathy or radiculopathy, cervical region: Secondary | ICD-10-CM | POA: Diagnosis not present

## 2020-12-02 DIAGNOSIS — R531 Weakness: Secondary | ICD-10-CM | POA: Diagnosis not present

## 2020-12-02 DIAGNOSIS — R000 Tachycardia, unspecified: Secondary | ICD-10-CM | POA: Diagnosis not present

## 2020-12-02 DIAGNOSIS — Z20822 Contact with and (suspected) exposure to covid-19: Secondary | ICD-10-CM | POA: Diagnosis not present

## 2020-12-02 DIAGNOSIS — M2578 Osteophyte, vertebrae: Secondary | ICD-10-CM | POA: Diagnosis not present

## 2020-12-02 DIAGNOSIS — J90 Pleural effusion, not elsewhere classified: Secondary | ICD-10-CM | POA: Diagnosis not present

## 2020-12-02 DIAGNOSIS — S12110A Anterior displaced Type II dens fracture, initial encounter for closed fracture: Principal | ICD-10-CM | POA: Insufficient documentation

## 2020-12-02 DIAGNOSIS — I130 Hypertensive heart and chronic kidney disease with heart failure and stage 1 through stage 4 chronic kidney disease, or unspecified chronic kidney disease: Secondary | ICD-10-CM | POA: Diagnosis not present

## 2020-12-02 DIAGNOSIS — I4891 Unspecified atrial fibrillation: Secondary | ICD-10-CM | POA: Insufficient documentation

## 2020-12-02 DIAGNOSIS — I5032 Chronic diastolic (congestive) heart failure: Secondary | ICD-10-CM | POA: Diagnosis present

## 2020-12-02 DIAGNOSIS — Z79899 Other long term (current) drug therapy: Secondary | ICD-10-CM | POA: Insufficient documentation

## 2020-12-02 DIAGNOSIS — R778 Other specified abnormalities of plasma proteins: Secondary | ICD-10-CM | POA: Diagnosis present

## 2020-12-02 DIAGNOSIS — I700 Atherosclerosis of aorta: Secondary | ICD-10-CM | POA: Diagnosis not present

## 2020-12-02 DIAGNOSIS — S12112A Nondisplaced Type II dens fracture, initial encounter for closed fracture: Secondary | ICD-10-CM | POA: Diagnosis not present

## 2020-12-02 DIAGNOSIS — R296 Repeated falls: Secondary | ICD-10-CM | POA: Diagnosis not present

## 2020-12-02 DIAGNOSIS — Z87891 Personal history of nicotine dependence: Secondary | ICD-10-CM | POA: Diagnosis not present

## 2020-12-02 DIAGNOSIS — R0602 Shortness of breath: Secondary | ICD-10-CM | POA: Diagnosis present

## 2020-12-02 DIAGNOSIS — N401 Enlarged prostate with lower urinary tract symptoms: Secondary | ICD-10-CM | POA: Diagnosis present

## 2020-12-02 DIAGNOSIS — F418 Other specified anxiety disorders: Secondary | ICD-10-CM | POA: Diagnosis present

## 2020-12-02 DIAGNOSIS — I472 Ventricular tachycardia, unspecified: Secondary | ICD-10-CM

## 2020-12-02 DIAGNOSIS — N138 Other obstructive and reflux uropathy: Secondary | ICD-10-CM | POA: Diagnosis present

## 2020-12-02 HISTORY — DX: Repeated falls: R29.6

## 2020-12-02 HISTORY — DX: Permanent atrial fibrillation: I48.21

## 2020-12-02 HISTORY — DX: Lymphedema, not elsewhere classified: I89.0

## 2020-12-02 HISTORY — DX: Cardiomegaly: I51.7

## 2020-12-02 HISTORY — DX: Chronic kidney disease, stage 4 (severe): N18.4

## 2020-12-02 NOTE — ED Triage Notes (Signed)
[facility] - pt from [facility]. EMS was called out for falls x 2 and some difficulty breathing. Pt is on eliquis.

## 2020-12-02 NOTE — ED Provider Notes (Signed)
[facility] Emergency Department Provider Note ____________________________________________   Event Date/Time   First MD Initiated Contact with Patient 12/02/20 2354     (approximate)  I have reviewed the triage vital signs and the nursing notes.   HISTORY  Chief Complaint Shortness of Breath    HPI Stephen Garrett is a 87 y.o. male with history of vascular dementia, atrial fibrillation on Eliquis, hypertension, hyperlipidemia, chronic kidney disease, CHF on furosemide 80 mg daily who presents to the emergency department from [facility] assisted living facility after they report that he had an unwitnessed fall yesterday and again today.  They state that they heard him banging on his door with his walker.  Unclear why he fell.  He also report the patient was complaining of shortness of breath.  No hypoxia with EMS.  Patient denies any shortness of breath, chest pain.  He has chronic bilateral lower extremity swelling, weeping.       Past Medical History:  Diagnosis Date   Actinic keratosis    BPH (benign prostatic hypertrophy)    Diseases of lips    Diverticulosis of colon 06/2003   ED (erectile dysfunction)    GERD (gastroesophageal reflux disease)    Heart murmur    as child   HLD (hyperlipidemia)    HOH (hard of hearing)    Cochlear implant right, hearing aid left   HTN (hypertension)    Numbness and tingling in both hands    intermittent, trying braces    Osteoarthritis of knee    severe, bilat   Renal insufficiency    Restless legs syndrome (RLS)    Sleep disturbance, unspecified    Spinal stenosis, lumbar region, without neurogenic claudication     Patient Active Problem List   Diagnosis Date Noted   Insomnia 11/26/2020   Right leg pain 09/27/2020   Bilateral lower extremity edema 09/27/2020   Anemia 09/27/2020   Vascular dementia without behavioral disturbance ([place]) 08/13/2020   Stasis ulcer ([place]) 08/13/2020   Chronic diastolic  heart failure ([place]) 01/14/2020   Hypokalemia 01/01/2020   A-fib ([place]) 01/01/2020   Diplopia 09/25/2017   TIA (transient ischemic attack) 09/14/2017   Generalized weakness 08/04/2017   Retinal vein occlusion, central 02/19/2014   Chronic venous insufficiency 02/19/2014   Episodic mood disorder ([place]) 12/20/2013   Advanced directives, counseling/discussion 12/20/2013   Chronic kidney disease, stage IV (severe) ([place]) 01/26/2009   Restless legs syndrome 10/03/2008   Osteoarthritis 12/12/2006   SPINAL STENOSIS, LUMBAR 06/24/2006   Essential hypertension, benign 06/21/2006   GERD 06/21/2006   BPH with obstruction/lower urinary tract symptoms 06/21/2006    Past Surgical History:  Procedure Laterality Date   CATARACT EXTRACTION, BILATERAL  2011   Dr. Thomasene Ripple   COCHLEAR IMPLANT Right 02/15/2017   Procedure: RIGHT COCHLEAR IMPLANT;  Surgeon: Vicie Mutters, MD;  Location: [place];  Service: ENT;  Laterality: Right;   FINGER SURGERY     Right 4th finger   LIH  10/2009   Dr. Bary Castilla   LUMBAR SPINE SURGERY  06/2005   stenosis repair   PTOSIS REPAIR Bilateral 04/19/2019   Procedure: BLEPHAROPTOSIS REPAIR; RESECT SKIN;  Surgeon: Karle Starch, MD;  Location: [place];  Service: Ophthalmology;  Laterality: Bilateral;   TONSILLECTOMY  childhood   TOTAL KNEE ARTHROPLASTY  06/2008   bilat (Dr. Marry Guan)    Prior to Admission medications   Medication Sig Start Date End Date Taking? Authorizing Provider  acetaminophen (TYLENOL) 325  MG tablet Take 2 tablets (650 mg total) by mouth every 6 (six) hours as needed for mild pain (or Fever >/= 101). 09/28/20   Barb Merino, MD  apixaban (ELIQUIS) 2.5 MG TABS tablet Take 1 tablet (2.5 mg total) by mouth 2 (two) times daily. 01/05/20   Dessa Phi, DO  buPROPion (WELLBUTRIN XL) 150 MG 24 hr tablet TAKE 1 TABLET BY MOUTH DAILY 03/02/20   [name], MD  clonazePAM (KLONOPIN) 0.5 MG tablet TAKE ONE TO TWO TABLETS AT BEDTIME  03/02/20   [name], MD  finasteride (PROSCAR) 5 MG tablet TAKE ONE TABLET BY MOUTH EVERY DAY 08/05/19   [name], MD  furosemide (LASIX) 40 MG tablet Take 1 tablet (40 mg total) by mouth daily. If home weight is over 175#--take a second dose after lunch 09/28/20 10/28/20  Barb Merino, MD  furosemide (LASIX) 80 MG tablet Take 80 mg by mouth daily.    [provider]  Multiple Vitamin (MULTIVITAMIN) capsule Take 1 capsule by mouth daily.    [provider]  omeprazole (PRILOSEC) 40 MG capsule TAKE 1 CAPSULE BY MOUTH ONCE DAILY 03/30/20   [name], MD  potassium chloride (KLOR-CON) 10 MEQ tablet Take 1 tablet (10 mEq total) by mouth 2 (two) times daily for 14 days. 09/28/20 10/12/20  Barb Merino, MD  pramipexole (MIRAPEX) 1 MG tablet TAKE ONE TABLET AT [facility] AND ONE TABLETAT BEDTIME 09/16/19   [name], MD  pyridoxine (B-6) 100 MG tablet Take 100 mg by mouth daily.    [provider]  tamsulosin (FLOMAX) 0.4 MG CAPS capsule TAKE 1 CAPSULE EVERY DAY 08/05/19   [name], MD  traZODone (DESYREL) 50 MG tablet Take 0.5 tablets (25 mg total) by mouth at bedtime as needed for sleep. 11/26/20   Jearld Fenton, NP  citalopram (CELEXA) 10 MG tablet Take 1 tablet (10 mg total) by mouth daily. 04/22/11 05/18/11  Viviana Simpler I, MD    Allergies Atorvastatin, Pravastatin sodium, and Statins  Family History  Problem Relation Age of Onset   Other Father        "clogged arteries"   Hypertension Mother     Social History Social History   Tobacco Use   Smoking status: Former    Packs/day: 0.75    Years: 30.00    Pack years: 22.50    Types: Cigarettes    Quit date: 02/28/1970    Years since quitting: 50.7   Smokeless tobacco: Never  Vaping Use   Vaping Use: Never used  Substance Use Topics   Alcohol use: Yes    Alcohol/week: 0.0 standard drinks    Comment: 1-2 glasses wine/daily   Drug use: No    Review of Systems Level 5 caveat  secondary to dementia   ____________________________________________   PHYSICAL EXAM:  VITAL SIGNS: Vitals:   12/03/20 0430 12/03/20 0500  BP: (!) 173/83 (!) 164/76  Pulse: (!) 50 (!) 56  Resp: 17 13  Temp:    SpO2: 94% 99%    CONSTITUTIONAL: Alert and oriented person only.  Elderly.  Hard of hearing.  Pleasantly demented.  Chronically ill-appearing. HEAD: Normocephalic; atraumatic EYES: Conjunctivae clear, PERRL, EOMI ENT: normal nose; no rhinorrhea; moist mucous membranes; pharynx without lesions noted; no dental injury; no septal hematoma NECK: Supple, no meningismus, no LAD; no midline spinal tenderness, step-off or deformity; trachea midline CARD: Irregularly irregular and rate controlled; S1 and S2 appreciated; no murmurs, no clicks, no rubs,  no gallops RESP: Normal chest excursion without splinting or tachypnea; breath sounds clear and equal bilaterally; no wheezes, no rhonchi, no rales; no hypoxia or respiratory distress CHEST:  chest wall stable, no crepitus or ecchymosis or deformity, nontender to palpation; no flail chest ABD/GI: Normal bowel sounds; non-distended; soft, non-tender, no rebound, no guarding; no ecchymosis or other lesions noted PELVIS:  stable, nontender to palpation BACK:  The back appears normal and is non-tender to palpation, there is no CVA tenderness; no midline spinal tenderness, step-off or deformity EXT: Normal ROM in all joints; non-tender to palpation; 2+ pitting edema in bilateral lower extremities with areas of weeping and some small open wounds with no surrounding redness, warmth, bleeding or purulent drainage.  Extremities warm and well-perfused.  Compartments soft. SKIN: Normal color for age and race; warm NEURO: Moves all extremities equally, no facial asymmetry, normal speech PSYCH: The patient's mood and manner are appropriate. Grooming and personal hygiene are appropriate.  ____________________________________________   LABS (all labs  ordered are listed, but only abnormal results are displayed)  Labs Reviewed  CBC WITH DIFFERENTIAL/PLATELET - Abnormal; Notable for the following components:      Result Value   RBC 4.15 (*)    Hemoglobin 12.8 (*)    RDW 16.3 (*)    Platelets 137 (*)    All other components within normal limits  BRAIN NATRIURETIC PEPTIDE - Abnormal; Notable for the following components:   B Natriuretic Peptide 532.1 (*)    All other components within normal limits  URINALYSIS, ROUTINE W REFLEX MICROSCOPIC - Abnormal; Notable for the following components:   Color, Urine YELLOW (*)    APPearance CLEAR (*)    Protein, ur 30 (*)    All other components within normal limits  BASIC METABOLIC PANEL - Abnormal; Notable for the following components:   Potassium 3.2 (*)    BUN 47 (*)    Creatinine, Ser 2.31 (*)    GFR, Estimated 25 (*)    All other components within normal limits  TROPONIN I (HIGH SENSITIVITY) - Abnormal; Notable for the following components:   Troponin I (High Sensitivity) 216 (*)    All other components within normal limits  TROPONIN I (HIGH SENSITIVITY) - Abnormal; Notable for the following components:   Troponin I (High Sensitivity) 223 (*)    All other components within normal limits   ____________________________________________  EKG   EKG Interpretation  Date/Time:  Wednesday December 02 2020 23:56:50 EDT Ventricular Rate:  63 PR Interval:    QRS Duration: 93 QT Interval:  419 QTC Calculation: 429 R Axis:   140 Text Interpretation: Atrial fibrillation Ventricular premature complex Anterior infarct, old Nonspecific T abnormalities, lateral leads Confirmed by Pryor Curia [phone]) on 12/03/2020 12:02:03 AM        ____________________________________________  RADIOLOGY Jessie Foot Violeta Lecount, personally viewed and evaluated these images (plain radiographs) as part of my medical decision making, as well as reviewing the written report by the radiologist.  ED MD interpretation:  Chest x-ray shows no acute abnormality.  No pulmonary edema.  CT head and cervical spine shows C2 fracture but no intracranial hemorrhage.  Official radiology report(s): DG Chest 2 View  Result Date: 12/03/2020 CLINICAL DATA:  Multiple falls and difficulty breathing. EXAM: CHEST - 2 VIEW COMPARISON:  September 26, 2020 FINDINGS: Mild, diffuse, chronic appearing increased interstitial lung markings are seen. There is no evidence of acute infiltrate or pneumothorax. A small left pleural effusion is seen. The cardiac silhouette is markedly enlarged  and unchanged in size. There is moderate severity calcification of the aortic arch. Degenerative changes seen throughout the thoracic spine. IMPRESSION: Stable cardiomegaly with a small left pleural effusion. Electronically Signed   By: Virgina Norfolk M.D.   On: 12/03/2020 00:36   CT HEAD WO CONTRAST (5MM)  Addendum Date: 12/03/2020   ADDENDUM REPORT: 12/03/2020 00:59 ADDENDUM: Critical Value/emergent results were called by telephone at the time of interpretation on 12/03/2020 at 12:55 am to Dr. Pryor Curia , who verbally acknowledged these results. Electronically Signed   By: Inez Catalina M.D.   On: 12/03/2020 00:59   Result Date: 12/03/2020 CLINICAL DATA:  Recent falls with altered mental status, known blood thinners EXAM: CT HEAD WITHOUT CONTRAST TECHNIQUE: Contiguous axial images were obtained from the base of the skull through the vertex without intravenous contrast. COMPARISON:  02/02/2019 FINDINGS: Brain: No evidence of acute infarction, hemorrhage, hydrocephalus, extra-axial collection or mass lesion/mass effect. Chronic atrophic and ischemic changes are noted. Vascular: No hyperdense vessel or unexpected calcification. Skull: Normal. Negative for fracture or focal lesion. Sinuses/Orbits: No acute finding. 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 IMPRESSION: Chronic atrophic and ischemic changes without acute intracranial abnormality. Electronically  Signed: By: Inez Catalina M.D. On: 12/03/2020 00:51   CT Cervical Spine Wo Contrast  Result Date: 12/03/2020 CLINICAL DATA:  Recent falls, initial encounter EXAM: CT CERVICAL SPINE WITHOUT CONTRAST TECHNIQUE: Multidetector CT imaging of the cervical spine was performed without intravenous contrast. Multiplanar CT image reconstructions were also generated. COMPARISON:  02/02/2020 FINDINGS: Alignment: Loss of normal cervical lordosis is noted. Skull base and vertebrae: 7 cervical segments are well visualized. There is a type 2 fracture through the base of the odontoid identified. Minimal displacement of the superior fracture fragment is noted by approximately 3 mm. No significant posterior displacement is seen. Multilevel facet hypertrophic changes and osteophytic changes are seen throughout the cervical spine. No other fracture is noted. Multilevel neural foraminal narrowing is noted bilaterally. Soft tissues and spinal canal: Surrounding soft tissue structures appear within normal limits. Vascular calcifications are seen Upper chest: Visualized lung apices are within normal limits. Other: None IMPRESSION: Fracture through the base of the odontoid consistent with a type 2 odontoid fracture. Multilevel degenerative changes are noted. No other fractures are seen. Critical Value/emergent results were called by telephone at the time of interpretation on 12/03/2020 at 12:55 am to Dr. Pryor Curia , who verbally acknowledged these results. Electronically Signed   By: Inez Catalina M.D.   On: 12/03/2020 00:57    ____________________________________________   PROCEDURES  Procedure(s) performed (including Critical Care):  Procedures   ____________________________________________   INITIAL IMPRESSION / ASSESSMENT AND PLAN / ED COURSE  As part of my medical decision making, I reviewed the following data within the [place] notes reviewed and incorporated, Labs reviewed , EKG  interpreted , Old EKG reviewed, Old chart reviewed, Radiograph reviewed , CTs reviewed, neurosurgery consulted, and Notes from prior ED visits         Patient here from his assisted living facility with 2 unwitnessed falls.  He is on Eliquis.  Will obtain CT of his head and cervical spine.  Appears to have a small skin tear to the right elbow but no other sign of traumatic injury.  Nursing home staff also reports he was complaining of shortness of breath.  He denies any complaints at this time.  No hypoxia.  No increased work of breathing.  Does have signs of peripheral  edema on exam the EMS reports staff told them is chronic.  Will obtain cardiac labs, chest x-ray.  Differential includes ACS, PE, CHF, pneumonia, pneumothorax.  EKG shows no new ischemic change.  He is in atrial fibrillation which is chronic and rate controlled.  ED PROGRESS  1:00 AM Rcvd call from radiologist that patient has a type II odontoid fracture.  He has approximately 3 mm of displacement of the superior fracture fragment but no posterior displacement noted.  He denies having any neck pain.  Will place in a [place] J collar.  He reports normal sensation diffusely and is able to move all extremities equally.  He states he felt like his right leg was weak today and and gave out on him that is what caused him to fall.  I do not appreciate any focal neurologic deficits.  Will discuss with neurosurgery for further recommendations.  1:25 AM  Spoke with Dr. Izora Ribas on call for neurosurgery.  Appreciate his help.  He agrees with keeping patient in cervical collar and he will schedule outpatient follow-up.  5:29 AM  Rcvd call from lab that patient has an elevated troponin.  On review of his records it appears that this has been his baseline since 2020 and his troponins normally run between 200s to 400s.  He is not having any chest pain or shortness of breath and EKG shows no new ischemic change.  BNP mildly elevated which also appears  to be his baseline.  He does have peripheral edema on exam that nursing home staff reports is stable.  Second troponin pending.  If no significant rise, anticipate discharge back to his nursing home.  6:10 AM  Pt continues to be stable.  Second troponin is flat.  Troponin leak again appears chronic and is stable.  I feel he is safe to be discharged back to his nursing facility.  Has been given outpatient follow-up with neurosurgery who will also contact him to schedule an appointment.  He is in a [place] J collar and have recommended he wear this at all times.  He denies any pain currently.  I do not feel he needs to be discharged with narcotics at this time as this would make him a even higher fall risk.  6:14 AM  Updated patient's daughter Cornell Barman.  She is comfortable with plan.  At this time, I do not feel there is any life-threatening condition present. I have reviewed, interpreted and discussed all results (EKG, imaging, lab, urine as appropriate) and exam findings with patient/family. I have reviewed nursing notes and appropriate previous records.  I feel the patient is safe to be discharged home without further emergent workup and can continue workup as an outpatient as needed. Discussed usual and customary return precautions. Patient/family verbalize understanding and are comfortable with this plan.  Outpatient follow-up has been provided as needed. All questions have been answered.  ____________________________________________   FINAL CLINICAL IMPRESSION(S) / ED DIAGNOSES  Final diagnoses:  Fall, initial encounter  Closed odontoid fracture with type II morphology and anterior displacement, initial encounter [facility])     ED Discharge Orders     None       *Please note:  Stephen Garrett was evaluated in Emergency Department on 12/03/2020 for the symptoms described in the history of present illness. He was evaluated in the context of the global COVID-19 pandemic, which  necessitated consideration that the patient might be at risk for infection with the SARS-CoV-2 virus that causes COVID-19. Institutional protocols  and algorithms that pertain to the evaluation of patients at risk for COVID-19 are in a state of rapid change based on information released by regulatory bodies including the CDC and federal and state organizations. These policies and algorithms were followed during the patient's care in the ED.  Some ED evaluations and interventions may be delayed as a result of limited staffing during and the pandemic.*   Note:  This document was prepared using Dragon voice recognition software and may include unintentional dictation errors.    Rawan Riendeau, Delice Bison, DO 12/03/20 [phone]

## 2020-12-03 ENCOUNTER — Encounter: Payer: Self-pay | Admitting: Internal Medicine

## 2020-12-03 ENCOUNTER — Emergency Department: Payer: Medicare Other

## 2020-12-03 DIAGNOSIS — N401 Enlarged prostate with lower urinary tract symptoms: Secondary | ICD-10-CM

## 2020-12-03 DIAGNOSIS — I472 Ventricular tachycardia, unspecified: Secondary | ICD-10-CM

## 2020-12-03 DIAGNOSIS — I4811 Longstanding persistent atrial fibrillation: Secondary | ICD-10-CM | POA: Diagnosis not present

## 2020-12-03 DIAGNOSIS — F418 Other specified anxiety disorders: Secondary | ICD-10-CM | POA: Diagnosis not present

## 2020-12-03 DIAGNOSIS — E785 Hyperlipidemia, unspecified: Secondary | ICD-10-CM | POA: Diagnosis not present

## 2020-12-03 DIAGNOSIS — I5032 Chronic diastolic (congestive) heart failure: Secondary | ICD-10-CM

## 2020-12-03 DIAGNOSIS — E876 Hypokalemia: Secondary | ICD-10-CM | POA: Diagnosis not present

## 2020-12-03 DIAGNOSIS — R296 Repeated falls: Secondary | ICD-10-CM

## 2020-12-03 DIAGNOSIS — S12110A Anterior displaced Type II dens fracture, initial encounter for closed fracture: Secondary | ICD-10-CM

## 2020-12-03 DIAGNOSIS — G2581 Restless legs syndrome: Secondary | ICD-10-CM

## 2020-12-03 DIAGNOSIS — W19XXXA Unspecified fall, initial encounter: Secondary | ICD-10-CM | POA: Diagnosis not present

## 2020-12-03 DIAGNOSIS — I517 Cardiomegaly: Secondary | ICD-10-CM | POA: Diagnosis not present

## 2020-12-03 DIAGNOSIS — I4891 Unspecified atrial fibrillation: Secondary | ICD-10-CM | POA: Diagnosis not present

## 2020-12-03 DIAGNOSIS — S12100A Unspecified displaced fracture of second cervical vertebra, initial encounter for closed fracture: Secondary | ICD-10-CM

## 2020-12-03 DIAGNOSIS — M2578 Osteophyte, vertebrae: Secondary | ICD-10-CM | POA: Diagnosis not present

## 2020-12-03 DIAGNOSIS — R778 Other specified abnormalities of plasma proteins: Secondary | ICD-10-CM | POA: Diagnosis present

## 2020-12-03 DIAGNOSIS — I700 Atherosclerosis of aorta: Secondary | ICD-10-CM | POA: Diagnosis not present

## 2020-12-03 DIAGNOSIS — R0600 Dyspnea, unspecified: Secondary | ICD-10-CM | POA: Diagnosis not present

## 2020-12-03 DIAGNOSIS — J90 Pleural effusion, not elsewhere classified: Secondary | ICD-10-CM | POA: Diagnosis not present

## 2020-12-03 DIAGNOSIS — I10 Essential (primary) hypertension: Secondary | ICD-10-CM | POA: Diagnosis not present

## 2020-12-03 DIAGNOSIS — N184 Chronic kidney disease, stage 4 (severe): Secondary | ICD-10-CM | POA: Diagnosis not present

## 2020-12-03 DIAGNOSIS — M47812 Spondylosis without myelopathy or radiculopathy, cervical region: Secondary | ICD-10-CM | POA: Diagnosis not present

## 2020-12-03 DIAGNOSIS — G459 Transient cerebral ischemic attack, unspecified: Secondary | ICD-10-CM

## 2020-12-03 DIAGNOSIS — N138 Other obstructive and reflux uropathy: Secondary | ICD-10-CM

## 2020-12-03 DIAGNOSIS — S12112A Nondisplaced Type II dens fracture, initial encounter for closed fracture: Secondary | ICD-10-CM | POA: Diagnosis not present

## 2020-12-03 LAB — CBC WITH DIFFERENTIAL/PLATELET
Abs Immature Granulocytes: 0.04 10*3/uL (ref 0.00–0.07)
Basophils Absolute: 0 10*3/uL (ref 0.0–0.1)
Basophils Relative: 1 %
Eosinophils Absolute: 0.1 10*3/uL (ref 0.0–0.5)
Eosinophils Relative: 1 %
HCT: 40.2 % (ref 39.0–52.0)
Hemoglobin: 12.8 g/dL — ABNORMAL LOW (ref 13.0–17.0)
Immature Granulocytes: 1 %
Lymphocytes Relative: 18 %
Lymphs Abs: 1.2 10*3/uL (ref 0.7–4.0)
MCH: 30.8 pg (ref 26.0–34.0)
MCHC: 31.8 g/dL (ref 30.0–36.0)
MCV: 96.9 fL (ref 80.0–100.0)
Monocytes Absolute: 0.4 10*3/uL (ref 0.1–1.0)
Monocytes Relative: 7 %
Neutro Abs: 4.7 10*3/uL (ref 1.7–7.7)
Neutrophils Relative %: 72 %
Platelets: 137 10*3/uL — ABNORMAL LOW (ref 150–400)
RBC: 4.15 MIL/uL — ABNORMAL LOW (ref 4.22–5.81)
RDW: 16.3 % — ABNORMAL HIGH (ref 11.5–15.5)
WBC: 6.5 10*3/uL (ref 4.0–10.5)
nRBC: 0 % (ref 0.0–0.2)

## 2020-12-03 LAB — URINALYSIS, ROUTINE W REFLEX MICROSCOPIC
Bacteria, UA: NONE SEEN
Bilirubin Urine: NEGATIVE
Glucose, UA: NEGATIVE mg/dL
Hgb urine dipstick: NEGATIVE
Ketones, ur: NEGATIVE mg/dL
Leukocytes,Ua: NEGATIVE
Nitrite: NEGATIVE
Protein, ur: 30 mg/dL — AB
Specific Gravity, Urine: 1.012 (ref 1.005–1.030)
Squamous Epithelial / HPF: NONE SEEN (ref 0–5)
pH: 5 (ref 5.0–8.0)

## 2020-12-03 LAB — BASIC METABOLIC PANEL
Anion gap: 12 (ref 5–15)
BUN: 47 mg/dL — ABNORMAL HIGH (ref 8–23)
CO2: 25 mmol/L (ref 22–32)
Calcium: 9 mg/dL (ref 8.9–10.3)
Chloride: 104 mmol/L (ref 98–111)
Creatinine, Ser: 2.31 mg/dL — ABNORMAL HIGH (ref 0.61–1.24)
GFR, Estimated: 25 mL/min — ABNORMAL LOW (ref >60)
Glucose, Bld: 94 mg/dL (ref 70–99)
Potassium: 3.2 mmol/L — ABNORMAL LOW (ref 3.5–5.1)
Sodium: 141 mmol/L (ref 135–145)

## 2020-12-03 LAB — MAGNESIUM: Magnesium: 1.6 mg/dL — ABNORMAL LOW (ref 1.7–2.4)

## 2020-12-03 LAB — TROPONIN I (HIGH SENSITIVITY)
Troponin I (High Sensitivity): 216 ng/L (ref <18)
Troponin I (High Sensitivity): 223 ng/L (ref <18)
Troponin I (High Sensitivity): 185 ng/L (ref <18)
Troponin I (High Sensitivity): 204 ng/L (ref <18)

## 2020-12-03 LAB — RESP PANEL BY RT-PCR (FLU A&B, COVID) ARPGX2
Influenza A by PCR: NEGATIVE
Influenza B by PCR: NEGATIVE
SARS Coronavirus 2 by RT PCR: NEGATIVE

## 2020-12-03 LAB — BRAIN NATRIURETIC PEPTIDE: B Natriuretic Peptide: 532.1 pg/mL — ABNORMAL HIGH (ref 0.0–100.0)

## 2020-12-03 MED ORDER — BUPROPION HCL ER (XL) 150 MG PO TB24: 150.0000 mg | ORAL_TABLET | ORAL | Status: DC

## 2020-12-03 MED ORDER — HYDRALAZINE HCL 25 MG PO TABS
25.0000 mg | ORAL_TABLET | Freq: Four times a day (QID) | ORAL | Status: DC
  Administered 2020-12-03 – 2020-12-04 (×5): 25 mg via ORAL
  Filled 2020-12-03 (×4): qty 1

## 2020-12-03 MED ORDER — TAMSULOSIN HCL 0.4 MG PO CAPS
0.4000 mg | ORAL_CAPSULE | Freq: Two times a day (BID) | ORAL | Status: DC
  Administered 2020-12-03 – 2020-12-04 (×2): 0.4 mg via ORAL
  Filled 2020-12-03 (×2): qty 1

## 2020-12-03 MED ORDER — FINASTERIDE 5 MG PO TABS
5.0000 mg | ORAL_TABLET | Freq: Every day | ORAL | Status: DC
  Administered 2020-12-03 – 2020-12-04 (×2): 5 mg via ORAL
  Filled 2020-12-03 (×3): qty 1

## 2020-12-03 MED ORDER — HYDRALAZINE HCL 20 MG/ML IJ SOLN: 5.0000 mg | INTRAMUSCULAR | Status: DC | PRN

## 2020-12-03 MED ORDER — NITROGLYCERIN 0.4 MG SL SUBL: 0.4000 mg | SUBLINGUAL_TABLET | SUBLINGUAL | Status: DC | PRN

## 2020-12-03 MED ORDER — CLONAZEPAM 0.5 MG PO TABS
0.5000 mg | ORAL_TABLET | Freq: Every day | ORAL | Status: DC
  Administered 2020-12-03: 0.5 mg via ORAL
  Filled 2020-12-03: qty 1

## 2020-12-03 MED ORDER — ADULT MULTIVITAMIN W/MINERALS CH
1.0000 | ORAL_TABLET | Freq: Every day | ORAL | Status: DC
  Administered 2020-12-04: 1 via ORAL
  Filled 2020-12-03: qty 1

## 2020-12-03 MED ORDER — POTASSIUM CHLORIDE CRYS ER 20 MEQ PO TBCR
40.0000 meq | EXTENDED_RELEASE_TABLET | Freq: Once | ORAL | Status: AC
  Administered 2020-12-03: 40 meq via ORAL
  Filled 2020-12-03: qty 2

## 2020-12-03 MED ORDER — MAGNESIUM SULFATE 2 GM/50ML IV SOLN
2.0000 g | Freq: Once | INTRAVENOUS | Status: AC
  Administered 2020-12-03: 2 g via INTRAVENOUS
  Filled 2020-12-03: qty 50

## 2020-12-03 MED ORDER — OXYCODONE-ACETAMINOPHEN 5-325 MG PO TABS
1.0000 | ORAL_TABLET | ORAL | Status: DC | PRN
  Administered 2020-12-04 (×2): 1 via ORAL
  Filled 2020-12-03 (×2): qty 1

## 2020-12-03 MED ORDER — ONDANSETRON HCL 4 MG/2ML IJ SOLN
4.0000 mg | Freq: Three times a day (TID) | INTRAMUSCULAR | Status: DC | PRN
Start: 2020-12-03 — End: 2020-12-05

## 2020-12-03 MED ORDER — ASPIRIN 81 MG PO CHEW: 324.0000 mg | CHEWABLE_TABLET | Freq: Once | ORAL | Status: DC

## 2020-12-03 MED ORDER — VITAMIN B-6 50 MG PO TABS
100.0000 mg | ORAL_TABLET | Freq: Every day | ORAL | Status: DC
  Administered 2020-12-04: 100 mg via ORAL
  Filled 2020-12-03: qty 2

## 2020-12-03 MED ORDER — MULTIVITAMINS PO CAPS: 1.0000 | ORAL_CAPSULE | Freq: Every day | ORAL | Status: DC

## 2020-12-03 MED ORDER — ACETAMINOPHEN 325 MG PO TABS: 650.0000 mg | ORAL_TABLET | Freq: Four times a day (QID) | ORAL | Status: DC | PRN

## 2020-12-03 MED ORDER — FUROSEMIDE 40 MG PO TABS
80.0000 mg | ORAL_TABLET | Freq: Every day | ORAL | Status: DC
  Administered 2020-12-03 – 2020-12-04 (×2): 80 mg via ORAL
  Filled 2020-12-03 (×2): qty 2

## 2020-12-03 MED ORDER — PRAMIPEXOLE DIHYDROCHLORIDE 0.25 MG PO TABS
0.5000 mg | ORAL_TABLET | Freq: Every day | ORAL | Status: DC
  Administered 2020-12-03 – 2020-12-04 (×2): 0.5 mg via ORAL
  Filled 2020-12-03 (×2): qty 2

## 2020-12-03 MED ORDER — TRAZODONE HCL 50 MG PO TABS
25.0000 mg | ORAL_TABLET | Freq: Every evening | ORAL | Status: DC | PRN
  Administered 2020-12-04: 25 mg via ORAL
  Filled 2020-12-03: qty 1

## 2020-12-03 MED ORDER — APIXABAN 2.5 MG PO TABS
2.5000 mg | ORAL_TABLET | Freq: Two times a day (BID) | ORAL | Status: DC
  Administered 2020-12-03 – 2020-12-04 (×2): 2.5 mg via ORAL
  Filled 2020-12-03 (×4): qty 1

## 2020-12-03 MED ORDER — PANTOPRAZOLE SODIUM 40 MG PO TBEC
40.0000 mg | DELAYED_RELEASE_TABLET | Freq: Every day | ORAL | Status: DC
  Administered 2020-12-03 – 2020-12-04 (×2): 40 mg via ORAL
  Filled 2020-12-03: qty 1

## 2020-12-03 MED ORDER — PRAMIPEXOLE DIHYDROCHLORIDE 1 MG PO TABS
1.0000 mg | ORAL_TABLET | Freq: Every day | ORAL | Status: DC
  Administered 2020-12-03: 1 mg via ORAL
  Filled 2020-12-03 (×2): qty 1

## 2020-12-03 NOTE — ED Notes (Signed)
Dr Blaine Hamper at bedside. Pt denies chest pain, SOB. Gave pt sips of water, tongue looks dry. Gave several bites of applesauce.

## 2020-12-03 NOTE — Consult Note (Signed)
[facility] Nurse Consult Note: Reason for Consult:Patient sent to ED from assisted living after unwitnessed fall x 2 days and some shortness of breath.  Was discharging back to facility this AM after negative workup and had a brief period of V tach.  Will now be admitted for cardiac/telemetry monitoring.  Sustained full thickness tissue loss to bilateral lower legs with falls.   Wound type:trauma Pressure Injury POA:NA Measurement: Left anterior lower leg:  1 cm x 0.2 cm x 0.1 cm skin tear Right lower leg nest dorsal foot and medial malleolus:  2 cm x 2 cm skin tear with defect noted, skin flap has been pulled off.  Wound bed: red and moist Drainage (amount, consistency, odor) minimal serosanguinous  Periwound: ecchymosis on bilateral lower legs.  Dressing procedure/placement/frequency: Cleanse skin tears to bilateral lower legs and pat dry. Apply Xeroform gauze to open wounds.  Cover with gauze and wrap with kerlix/tape.  Change daily.  Will not follow at this time.  Please re-consult if needed.  Domenic Moras MSN, RN, FNP-BC CWON Wound, Ostomy, Continence Nurse Pager [phone]

## 2020-12-03 NOTE — Consult Note (Signed)
Neurosurgery-New Consultation Evaluation 12/03/2020 Stephen Garrett [id number]  Identifying Statement: Stephen Garrett is a 87 y.o. male from [place] [id number] presenting after a fall  Physician Requesting Consultation: No ref. provider found  History of Present Illness: Stephen Garrett is a 14 y.o with a medical history of hypertension, hyperlipidemia, GERD, CKD stage IV, hearing loss, dementia, CHF, A. fib on Eliquis presenting after a fall at his nursing facility.  According to chart review patient had an unwitnessed fall about 2 days ago at his facility for an unknown reason.  He reported mild neck pain which she states is largely baseline for him without any radiating symptoms.  He was brought to the ED for further evaluation and complained of shortness of breath.  A CT scan of C-spine was done which did show a type II odontoid fracture for which neurosurgery was consulted. At present he continues to have some mild stiffness in his neck which he states is largely chronic for him without any radiating symptoms.   Past Medical History:  Past Medical History:  Diagnosis Date   Actinic keratosis    BPH (benign prostatic hypertrophy)    CKD (chronic kidney disease), stage IV (HCC)    Diseases of lips    Diverticulosis of colon 06/2003   ED (erectile dysfunction)    Frequent falls    GERD (gastroesophageal reflux disease)    Heart murmur    as child   HLD (hyperlipidemia)    HOH (hard of hearing)    Cochlear implant right, hearing aid left   HTN (hypertension)    LVH (left ventricular hypertrophy)    a. 01/2020 Echo: EF 60-65%, no rwma, sev LVH. Nl RV size/fxn. Sev BAE. Mild MR.   Lymphedema    Numbness and tingling in both hands    intermittent, trying braces    Osteoarthritis of knee    severe, bilat   Permanent atrial fibrillation (HCC)    Restless legs syndrome (RLS)    Sleep disturbance, unspecified    Spinal stenosis, lumbar region, without neurogenic claudication      Social History: Social History   Socioeconomic History   Marital status: Widowed    Spouse name: Not on file   Number of children: 2   Years of education: Not on file   Highest education level: Not on file  Occupational History   Occupation: Retired  Tobacco Use   Smoking status: Former    Packs/day: 0.75    Years: 30.00    Pack years: 22.50    Types: Cigarettes    Quit date: 02/28/1970    Years since quitting: 50.7   Smokeless tobacco: Never  Vaping Use   Vaping Use: Never used  Substance and Sexual Activity   Alcohol use: Yes    Alcohol/week: 0.0 standard drinks    Comment: 1-2 glasses wine/daily   Drug use: No   Sexual activity: Not on file  Other Topics Concern   Not on file  Social History Narrative   Widowed 2010   2 adopted children   Has living will   Daughter is now health care power of attorney   Would accept resuscitation but no prolonged ventilation   No tube feeds if cognitively unaware   Social Determinants of Health   Financial Resource Strain: Not on file  Food Insecurity: Not on file  Transportation Needs: Not on file  Physical Activity: Not on file  Stress: Not on file  Social Connections: Not on file  Intimate  Partner Violence: Not on file    Family History: Family History  Problem Relation Age of Onset   Other Father        "clogged arteries"   Hypertension Mother     Review of Systems:  Review of Systems - General ROS: Negative Psychological ROS: Negative Ophthalmic ROS: Negative ENT ROS: Negative Hematological and Lymphatic ROS: Negative  Endocrine ROS: Negative Respiratory ROS: Negative Cardiovascular ROS: Negative Gastrointestinal ROS: Negative Genito-Urinary ROS: Negative Musculoskeletal ROS: Negative Neurological ROS: Negative Dermatological ROS: Negative  Physical Exam: BP (!) 158/77   Pulse 62   Temp 98 F (36.7 C) (Oral)   Resp 20   Ht 5\' 7"  (1.702 m)   Wt 77.7 kg   SpO2 99%   BMI 26.83 kg/m  Body mass  index is 26.83 kg/m. Body surface area is 1.92 meters squared. General appearance: Alert, cooperative, in no acute distress Head: Normocephalic, atraumatic Eyes: Normal, EOM intact Oropharynx: Moist without lesions Neck: Supple, no tenderness Heart: Normal, regular rate and rhythm, without murmur Lungs: Clear to auscultation, good air exchange Abdomen: Soft, nondistended Ext: No edema in LE bilaterally, good distal pulses  Neurologic exam:  Mental status: alertness: alert, orientation: person, place, time, affect: normal Speech: fluent and clear Cranial nerves:  II: Visual fields are full by confrontation, no ptosis III/IV/VI: extra-ocular motions intact bilaterally V/VII:no evidence of facial droop or weakness and facial sensation intact VIII: hearing normal XI: trapezius strength symmetric,  sternocleidomastoid strength symmetric XII: tongue strength symmetric  Motor:strength symmetric 5/5, normal muscle mass and tone in all extremities and no pronator drift Sensory: intact to light touch in all extremities Reflexes: 2+ and symmetric bilaterally for arms and legs Coordination: intact finger to nose Gait: normal   Laboratory: Results for orders placed or performed during the hospital encounter of 12/02/20  Resp Panel by RT-PCR (Flu A&B, Covid) Nasopharyngeal Swab   Specimen: Nasopharyngeal Swab; Nasopharyngeal(NP) swabs in vial transport medium  Result Value Ref Range   SARS Coronavirus 2 by RT PCR NEGATIVE NEGATIVE   Influenza A by PCR NEGATIVE NEGATIVE   Influenza B by PCR NEGATIVE NEGATIVE  CBC with Differential/Platelet  Result Value Ref Range   WBC 6.5 4.0 - 10.5 K/uL   RBC 4.15 (L) 4.22 - 5.81 MIL/uL   Hemoglobin 12.8 (L) 13.0 - 17.0 g/dL   HCT 40.2 39.0 - 52.0 %   MCV 96.9 80.0 - 100.0 fL   MCH 30.8 26.0 - 34.0 pg   MCHC 31.8 30.0 - 36.0 g/dL   RDW 16.3 (H) 11.5 - 15.5 %   Platelets 137 (L) 150 - 400 K/uL   nRBC 0.0 0.0 - 0.2 %   Neutrophils Relative % 72 %    Neutro Abs 4.7 1.7 - 7.7 K/uL   Lymphocytes Relative 18 %   Lymphs Abs 1.2 0.7 - 4.0 K/uL   Monocytes Relative 7 %   Monocytes Absolute 0.4 0.1 - 1.0 K/uL   Eosinophils Relative 1 %   Eosinophils Absolute 0.1 0.0 - 0.5 K/uL   Basophils Relative 1 %   Basophils Absolute 0.0 0.0 - 0.1 K/uL   Immature Granulocytes 1 %   Abs Immature Granulocytes 0.04 0.00 - 0.07 K/uL  Brain natriuretic peptide  Result Value Ref Range   B Natriuretic Peptide 532.1 (H) 0.0 - 100.0 pg/mL  Urinalysis, Routine w reflex microscopic  Result Value Ref Range   Color, Urine YELLOW (A) YELLOW   APPearance CLEAR (A) CLEAR   Specific Gravity, Urine  1.012 1.005 - 1.030   pH 5.0 5.0 - 8.0   Glucose, UA NEGATIVE NEGATIVE mg/dL   Hgb urine dipstick NEGATIVE NEGATIVE   Bilirubin Urine NEGATIVE NEGATIVE   Ketones, ur NEGATIVE NEGATIVE mg/dL   Protein, ur 30 (A) NEGATIVE mg/dL   Nitrite NEGATIVE NEGATIVE   Leukocytes,Ua NEGATIVE NEGATIVE   RBC / HPF 0-5 0 - 5 RBC/hpf   WBC, UA 0-5 0 - 5 WBC/hpf   Bacteria, UA NONE SEEN NONE SEEN   Squamous Epithelial / LPF NONE SEEN 0 - 5   Mucus PRESENT    Hyaline Casts, UA PRESENT   Basic metabolic panel  Result Value Ref Range   Sodium 141 135 - 145 mmol/L   Potassium 3.2 (L) 3.5 - 5.1 mmol/L   Chloride 104 98 - 111 mmol/L   CO2 25 22 - 32 mmol/L   Glucose, Bld 94 70 - 99 mg/dL   BUN 47 (H) 8 - 23 mg/dL   Creatinine, Ser 2.31 (H) 0.61 - 1.24 mg/dL   Calcium 9.0 8.9 - 10.3 mg/dL   GFR, Estimated 25 (L) >60 mL/min   Anion gap 12 5 - 15  Magnesium  Result Value Ref Range   Magnesium 1.6 (L) 1.7 - 2.4 mg/dL  Troponin I (High Sensitivity)  Result Value Ref Range   Troponin I (High Sensitivity) 216 (HH) <18 ng/L  Troponin I (High Sensitivity)  Result Value Ref Range   Troponin I (High Sensitivity) 223 (HH) <18 ng/L  Troponin I (High Sensitivity)  Result Value Ref Range   Troponin I (High Sensitivity) 185 (HH) <18 ng/L   I personally reviewed labs  Imaging: CT  C-spine IMPRESSION: Fracture through the base of the odontoid consistent with a type 2 odontoid fracture.   Multilevel degenerative changes are noted. No other fractures are seen.   Critical Value/emergent results were called by telephone at the time of interpretation on 12/03/2020 at 12:55 am to Dr. Pryor Curia , who verbally acknowledged these results.     Electronically Signed   By: Inez Catalina M.D.   On: 12/03/2020 00:57  CT head IMPRESSION: Chronic atrophic and ischemic changes without acute intracranial abnormality.   Electronically Signed: By: Inez Catalina M.D. On: 12/03/2020 00:51    Impression/Plan:   1.  Diagnosis: Type II odontoid fracture after an unwitnessed fall.  2.  Plan - conservative treatment in cervical collar; please remove for meals to avoid aspiration - f/u in 2 weeks with neurosurgery outpatient with repeat cervical xrays. Pt was given appointment date and time on print out in room. - please call with any additional questions or concerns.    Cooper Render PA-C Neurosurgery

## 2020-12-03 NOTE — ED Notes (Signed)
Troponin 234, lab called. Basically unchanged from last 2 values.

## 2020-12-03 NOTE — ED Notes (Signed)
This RN did not give lasix yet because pt has been sleeping.

## 2020-12-03 NOTE — Discharge Instructions (Addendum)
You were found to have a fracture of your second cervical vertebra.  You need to wear your cervical collar at all times.  Please follow-up with neurosurgery as an outpatient.

## 2020-12-03 NOTE — ED Notes (Signed)
Other RN went into room to help pt onto bedpan. Pt displayed sustained v tach on monitor with rate of 170. Strip was printed and shown to ED doctor. Pt is going to be admitted and discharge will be canceled. This RN will call facility and inform pt being admitted here.

## 2020-12-03 NOTE — Consult Note (Signed)
Cardiology Consult    Patient ID: TINA TEMME MRN: [id number], DOB/AGE: 04-Jan-1924   Admit date: 12/02/2020 Date of Consult: 12/03/2020  Primary Physician: [name], MD Primary Cardiologist: Kate Sable, MD Requesting Provider: Mora Bellman, MD  Patient Profile    Stephen Garrett is a 87 y.o. male with a history of permanent Afib, frequent falls, HTN, HL, CKD IV, LVH, lymphedema/chronic lower ext edema, BPH, GERD, restless leg syndrome, and hearing difficulty, who is being seen today for the evaluation of elevated HsTroponin and lower ext edema at the request of Dr. Blaine Hamper.  Past Medical History   Past Medical History:  Diagnosis Date   Actinic keratosis    BPH (benign prostatic hypertrophy)    CKD (chronic kidney disease), stage IV (HCC)    Diseases of lips    Diverticulosis of colon 06/2003   ED (erectile dysfunction)    Frequent falls    GERD (gastroesophageal reflux disease)    Heart murmur    as child   HLD (hyperlipidemia)    HOH (hard of hearing)    Cochlear implant right, hearing aid left   HTN (hypertension)    LVH (left ventricular hypertrophy)    a. 01/2020 Echo: EF 60-65%, no rwma, sev LVH. Nl RV size/fxn. Sev BAE. Mild MR.   Lymphedema    Numbness and tingling in both hands    intermittent, trying braces    Osteoarthritis of knee    severe, bilat   Permanent atrial fibrillation (HCC)    Restless legs syndrome (RLS)    Sleep disturbance, unspecified    Spinal stenosis, lumbar region, without neurogenic claudication     Past Surgical History:  Procedure Laterality Date   CATARACT EXTRACTION, BILATERAL  2011   Dr. Thomasene Ripple   COCHLEAR IMPLANT Right 02/15/2017   Procedure: RIGHT COCHLEAR IMPLANT;  Surgeon: Vicie Mutters, MD;  Location: [facility];  Service: ENT;  Laterality: Right;   FINGER SURGERY     Right 4th finger   LIH  10/2009   Dr. Bary Castilla   LUMBAR SPINE SURGERY  06/2005   stenosis repair   PTOSIS REPAIR Bilateral  04/19/2019   Procedure: BLEPHAROPTOSIS REPAIR; RESECT SKIN;  Surgeon: Karle Starch, MD;  Location: [facility];  Service: Ophthalmology;  Laterality: Bilateral;   TONSILLECTOMY  childhood   TOTAL KNEE ARTHROPLASTY  06/2008   bilat (Dr. Marry Guan)     Allergies  Allergies  Allergen Reactions   Atorvastatin     REACTION: raises blood pressure   Pravastatin Sodium     REACTION: raises blood pressure   Statins     REACTION: raises bp    History of Present Illness    87 y.o. male with a history of permanent Afib, frequent falls, HTN, HL, CKD IV, LVH, lymphedema/chronic lower ext edema, restless leg syndrome, BPH, GERD, and hearing difficulty.  In late 2021, he was eval in the ED 2/2 fatigue and presyncope and was noted to be in Afib.  HsTrop was elevated in the 300 range.  Echo showed EF 55-60%.  Severe biatrial enalargement was noted and decision was made to pursue a rate-control strategy.  He has not required an AVN blocking agent.  He is anticoagulated w/ eliquis 2.5mg  BID in the setting of advanced age and creat trend of ~ 2.3-3 since 2021.  Amlodipine was d/c'd due to lower ext edema.  He was readmitted in 01/2020 w/ weakness and found to be hypokalemic w/ K+ of 2.6.  HsTrop was again elevated in the 400 range.  He was noted to have 6 beats of NSVT in the setting of hypokalemia. Repeat echo again showed nl LV fxn, and he was conservatively managed.  He required readmission in July 2022 2/2 weakness and hypokalemia.  Pt lives @ Assisted living facility.  He reports 3 falls in the past week.  On one occasion, he slipped on a wet bathroom floor.  On the other two, including the one which prompted ED eval 10/5, he says that he was standing against a wall and that his right leg 'gave out' on him, causing him to fall to the floor.  On 10/5, staff heard him banging his walker on a door, prompting them to find him on the floor.  EMS was called and he was transported to [facility] ED.  Here, he was  hypertensive @ 186/128.  ECG w/ rate-controlled Afib and w/o  acute ST/T canges.  HsTrop elevated @ 216  223.  K 3.2.  XR w/ cardiomegaly and sm L pl effusion.  CT of C spine w/ type II odontoid fx  C collar w/ rec for conservative mgmt by [facility].  On tele, he was noted to have a prolonged run of NSVT - apparently asymptomatic, along w/ periodic bradycardia and pauses up to 2.47 secs (? During periods of sleep).  Pt is currently lying in bed.  He was sleeping upon arrival but aroused quickly.  He denies c/p, dyspnea, head/neck pain.  He is clear that none of prior falls were assoc w/ syncope.  Home Medications     furosemide  80 mg Oral Daily   Prior to Admission medications   Medication Sig Start Date End Date Taking? Authorizing Provider  acetaminophen (TYLENOL) 325 MG tablet Take 2 tablets (650 mg total) by mouth every 6 (six) hours as needed for mild pain (or Fever >/= 101). 09/28/20  Yes Barb Merino, MD  apixaban (ELIQUIS) 2.5 MG TABS tablet Take 1 tablet (2.5 mg total) by mouth 2 (two) times daily. 01/05/20  Yes Dessa Phi, DO  buPROPion (WELLBUTRIN XL) 150 MG 24 hr tablet TAKE 1 TABLET BY MOUTH DAILY Patient taking differently: Take 150 mg by mouth every other day. Bedtime 03/02/20  Yes [name], MD  clonazePAM (KLONOPIN) 0.5 MG tablet TAKE ONE TO TWO TABLETS AT BEDTIME 03/02/20  Yes [name], MD  finasteride (PROSCAR) 5 MG tablet TAKE ONE TABLET BY MOUTH EVERY DAY 08/05/19  Yes [name], MD  furosemide (LASIX) 40 MG tablet Take 1 tablet (40 mg total) by mouth daily. If home weight is over 175#--take a second dose after lunch 09/28/20 12/03/20 Yes Ghimire, Dante Gang, MD  furosemide (LASIX) 80 MG tablet Take 80 mg by mouth daily.   Yes [provider]  Multiple Vitamin (MULTIVITAMIN) capsule Take 1 capsule by mouth daily.   Yes [provider]  omeprazole (PRILOSEC) 40 MG capsule TAKE 1 CAPSULE BY MOUTH ONCE DAILY 03/30/20  Yes [name], MD  potassium  chloride (KLOR-CON) 10 MEQ tablet Take 1 tablet (10 mEq total) by mouth 2 (two) times daily for 14 days. 09/28/20 12/03/20 Yes Barb Merino, MD  pramipexole (MIRAPEX) 0.5 MG tablet Take 0.5 mg by mouth daily with supper. 10/21/20  Yes [provider]  pramipexole (MIRAPEX) 1 MG tablet TAKE ONE TABLET AT DINNER AND ONE TABLETAT BEDTIME Patient taking differently: Take 1 mg by mouth at bedtime. 09/16/19  Yes [name], MD  pyridoxine (B-6)  100 MG tablet Take 100 mg by mouth daily.   Yes [provider]  tamsulosin (FLOMAX) 0.4 MG CAPS capsule TAKE 1 CAPSULE EVERY DAY Patient taking differently: Take 0.4 mg by mouth 2 (two) times daily. 08/05/19  Yes [name], MD  traZODone (DESYREL) 50 MG tablet Take 0.5 tablets (25 mg total) by mouth at bedtime as needed for sleep. 11/26/20  Yes Jearld Fenton, NP  citalopram (CELEXA) 10 MG tablet Take 1 tablet (10 mg total) by mouth daily. 04/22/11 05/18/11  [name], MD     Family History    Family History  Problem Relation Age of Onset   Other Father        "clogged arteries"   Hypertension Mother    He indicated that his mother is deceased. He indicated that his father is deceased. He indicated that his brother is deceased. He indicated that his daughter is alive. He indicated that his son is deceased.   Social History    Social History   Socioeconomic History   Marital status: Widowed    Spouse name: Not on file   Number of children: 2   Years of education: Not on file   Highest education level: Not on file  Occupational History   Occupation: Retired  Tobacco Use   Smoking status: Former    Packs/day: 0.75    Years: 30.00    Pack years: 22.50    Types: Cigarettes    Quit date: 02/28/1970    Years since quitting: 50.7   Smokeless tobacco: Never  Vaping Use   Vaping Use: Never used  Substance and Sexual Activity   Alcohol use: Yes    Alcohol/week: 0.0 standard drinks    Comment: 1-2 glasses  wine/daily   Drug use: No   Sexual activity: Not on file  Other Topics Concern   Not on file  Social History Narrative   Widowed 2010   2 adopted children   Has living will   Daughter is now health care power of attorney   Would accept resuscitation but no prolonged ventilation   No tube feeds if cognitively unaware   Social Determinants of Health   Financial Resource Strain: Not on file  Food Insecurity: Not on file  Transportation Needs: Not on file  Physical Activity: Not on file  Stress: Not on file  Social Connections: Not on file  Intimate Partner Violence: Not on file     Review of Systems    General:  Generalized fatigue and leg wkns assoc w/ falls.  No chills, fever, night sweats or weight changes.  Very HOH. Cardiovascular:  No chest pain, dyspnea on exertion, edema, orthopnea, palpitations, paroxysmal nocturnal dyspnea. Dermatological: No rash, lesions/masses Respiratory: No cough, dyspnea Urologic: No hematuria, dysuria Abdominal:   No nausea, vomiting, diarrhea, bright red blood per rectum, melena, or hematemesis Neurologic:  No visual changes, wkns, changes in mental status. All other systems reviewed and are otherwise negative except as noted above.  Physical Exam    Blood pressure (!) 181/133, pulse 62, temperature 98.3 F (36.8 C), temperature source Oral, resp. rate 15, height 5\' 7"  (1.702 m), weight 77.7 kg, SpO2 97 %.  General: Pleasant, NAD Psych: Normal affect. Neuro: Alert and oriented X 3. Moves all extremities spontaneously. HEENT: hard of hearing. Neck: Supple without bruits or JVD. Lungs:  Resp regular and unlabored, CTA. Heart: IR, IR, no s3, s4, or murmurs. Abdomen: Soft, non-tender, non-distended, BS + x 4.  Extremities:  No clubbing, cyanosis.  1-2+ bilat predominantly woody edema extending up to the posterior thighs.  Large blister on the R anterior lower leg.  Gauze wraps to bilat LE w/ significant drainage noted to wrap on the L lower  leg. DP/PT1+, Radials 2+ and equal bilaterally.  Labs    Cardiac Enzymes Recent Labs  Lab 12/03/20 0000 12/03/20 0143  TROPONINIHS 216* 223*      Lab Results  Component Value Date   WBC 6.5 12/03/2020   HGB 12.8 (L) 12/03/2020   HCT 40.2 12/03/2020   MCV 96.9 12/03/2020   PLT 137 (L) 12/03/2020    Recent Labs  Lab 12/03/20 0000  NA 141  K 3.2*  CL 104  CO2 25  BUN 47*  CREATININE 2.31*  CALCIUM 9.0  GLUCOSE 94   Lab Results  Component Value Date   CHOL 199 01/01/2020   HDL 37 (L) 01/01/2020   LDLCALC 148 (H) 01/01/2020   TRIG 70 01/01/2020    Radiology Studies    DG Chest 2 View  Result Date: 12/03/2020 CLINICAL DATA:  Multiple falls and difficulty breathing. EXAM: CHEST - 2 VIEW COMPARISON:  September 26, 2020 FINDINGS: Mild, diffuse, chronic appearing increased interstitial lung markings are seen. There is no evidence of acute infiltrate or pneumothorax. A small left pleural effusion is seen. The cardiac silhouette is markedly enlarged and unchanged in size. There is moderate severity calcification of the aortic arch. Degenerative changes seen throughout the thoracic spine. IMPRESSION: Stable cardiomegaly with a small left pleural effusion. Electronically Signed   By: Virgina Norfolk M.D.   On: 12/03/2020 00:36   CT HEAD WO CONTRAST (5MM)  Addendum Date: 12/03/2020   ADDENDUM REPORT: 12/03/2020 00:59 ADDENDUM: Critical Value/emergent results were called by telephone at the time of interpretation on 12/03/2020 at 12:55 am to Dr. Pryor Curia , who verbally acknowledged these results. Electronically Signed   By: Inez Catalina M.D.   On: 12/03/2020 00:59   Result Date: 12/03/2020 CLINICAL DATA:  Recent falls with altered mental status, known blood thinners EXAM: CT HEAD WITHOUT CONTRAST TECHNIQUE: Contiguous axial images were obtained from the base of the skull through the vertex without intravenous contrast. COMPARISON:  02/02/2019 FINDINGS: Brain: No evidence of acute  infarction, hemorrhage, hydrocephalus, extra-axial collection or mass lesion/mass effect. Chronic atrophic and ischemic changes are noted. Vascular: No hyperdense vessel or unexpected calcification. Skull: Normal. Negative for fracture or focal lesion. Sinuses/Orbits: No acute finding. 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 IMPRESSION: Chronic atrophic and ischemic changes without acute intracranial abnormality. Electronically Signed: By: Inez Catalina M.D. On: 12/03/2020 00:51   CT Cervical Spine Wo Contrast  Result Date: 12/03/2020 CLINICAL DATA:  Recent falls, initial encounter EXAM: CT CERVICAL SPINE WITHOUT CONTRAST TECHNIQUE: Multidetector CT imaging of the cervical spine was performed without intravenous contrast. Multiplanar CT image reconstructions were also generated. COMPARISON:  02/02/2020 FINDINGS: Alignment: Loss of normal cervical lordosis is noted. Skull base and vertebrae: 7 cervical segments are well visualized. There is a type 2 fracture through the base of the odontoid identified. Minimal displacement of the superior fracture fragment is noted by approximately 3 mm. No significant posterior displacement is seen. Multilevel facet hypertrophic changes and osteophytic changes are seen throughout the cervical spine. No other fracture is noted. Multilevel neural foraminal narrowing is noted bilaterally. Soft tissues and spinal canal: Surrounding soft tissue structures appear within normal limits. Vascular calcifications are seen Upper chest: Visualized lung apices are within normal limits. Other:  None IMPRESSION: Fracture through the base of the odontoid consistent with a type 2 odontoid fracture. Multilevel degenerative changes are noted. No other fractures are seen. Critical Value/emergent results were called by telephone at the time of interpretation on 12/03/2020 at 12:55 am to Dr. Pryor Curia , who verbally acknowledged these results. Electronically Signed   By: Inez Catalina M.D.   On:  12/03/2020 00:57    ECG & Cardiac Imaging    Afib, 63, rightward axis, Anterior infarct, nonspecific T changes - personally reviewed.  Assessment & Plan    1.  Elevated HsTroponin:  Pt has had elevated HsTrops on each of his [facility] visits dating back to 12/2019.  Ranges typically between 200 and 400.  Prior echos w/ nl EF and no rwma.  He denies chest pain/dyspnea currently, or any h/o such @ home.  ECG w/ prior ant infarct and nonspecific T changes, but no acute changes.  Chronic elevations likely represent demand ischemia and a poor prognosis.  He is a poor candidate for ischemic evaluation given advanced age, dementia, and CKD IV.  Rec continued conservative mgmt w/ a low threshold to involve palliative care.  No ASA in the setting of chronic eliquis.  No ? blocker in the setting of bradycardia and pauses.  No statin in the setting of prior intolerance.  2.  NSVT:  prolonged run of NSVT while in ED @ 0853 this AM.  No reported symptoms.  K 3.2.  Mg 1.6.  Potassium supplementation ordered.  I will supplement Mg.  Follow after supplementation and plan on outpt oral supplementation.  He is a poor candidate for ? blocker/AAD given frequent bradycardia and pauses up to 2.47 secs.  Follow tele after supplementation.    3.  Hypokalemia/hypomagnesemia:  K 3.2 on arrival.  Multiple admissions for wkns and hypokalemia over the past year.  He was prev on 60meq BID, but that appears to be an old Rx.  Will need more aggressive supplementation as outpt w/ freq monitoring.  4.  Permanent Atrial fibrillation:  Rate controlled in the absence of AVN blocking agents.  Anticoagulated w/ Eliquis in the setting of a CHA2DS2VASc of at least 5.  With frequent falls, we may wish to reconsider [place] going forward.  5.  Chronic lower ext lymphedema:  Prev nl EF by echos in 12/2019 and 01/2020.  He is managed as an outpt w/ oral lasix.  Currently w/ 1-2+ bilat predominantly woody edema extending up to his lower posterior thighs.   CKD IV limits diuretic dosing as outpt.  Sene by wound care r/t skin tears - legs partially wrapped.  He would likely benefit from some form of compression, perhaps ideally w/ ace bandages as the act of pulling on compression socks would likely worsen the issue of skin tearing.  6.  Essential HTN:  BP high in the ED.  Review of last years worth of BPs recorded in our system indicate that he freq trends between the 140's and 160's.  He is only on oral lasix @ home.  Options limited in setting of frequent bradycardia and pauses, lower ext edema (prev worsened by amlodipine), and CKD IV.  Can consider adding hydralazine, but would have to watch for orthostasis.  7.  Frequent Falls:  Pt reports frequent unsteadiness.  Multiple admissions over the past year for unsteadiness/wkns/falls - always in the setting of hypokalemia - likely speaking to nutrition and diuretic dosing.  Prev seen by PT/OT w/ rec for outpt PT/OT.  8.  Odontoid fracture:  C collar in place.  Conservative mgmt per [facility].  Signed, Murray Hodgkins, NP 12/03/2020, 10:46 AM  For questions or updates, please contact   Please consult [email or web address] for contact info under Cardiology/STEMI.

## 2020-12-03 NOTE — ED Notes (Signed)
Pt received dinner tray from dietary - minimal assistance required from this RN once meal tray was placed within reach and protein cut into bite size pieces.

## 2020-12-03 NOTE — ED Notes (Signed)
Patient remains sleeping. C-Collar is in place.

## 2020-12-03 NOTE — H&P (Signed)
History and Physical    DENNIE MOLTZ MPN:[id number] DOB: [birth year] DOA: 12/02/2020  Referring MD/NP/PA:   PCP: [name], MD   Patient coming from:  The patient is coming from [facility]   Chief Complaint: fall  HPI: Stephen Garrett is a 87 y.o. male with medical history significant of hypertension, hyperlipidemia, TIA, GERD, depression with anxiety, RLS, CKD stage IV, hard of hearing, BPH, dementia, dCHF, atrial fibrillation on Eliquis, who presents with fall.  Per report, pt had unwitnessed fall 2 days ago and again yesterday in facility. They state that they heard him banging on his door with his walker.  Unclear why he fell. Pt has mild pain his neck. No headache.  Patient reported shortness of breath to ED physician, but denies shortness of breath to me.  Patient also denies chest pain, cough, abdominal pain, nausea, vomiting, diarrhea.  No fever or chills.  No symptoms of UTI. He has chronic bilateral lower extremity swelling and weeping.  Per EDP, patient had 1 episode of V. tach with heart rate up to 170 in ED. currently his heart rate is 40-50s  ED Course: pt was found to have WBC 6.5, pending COVID-19 PCR, troponin level 216, 223, BNP 532, negative urinalysis, stable renal function, potassium 3.2, temperature normal, blood pressure 186/128, RR 21, oxygen saturation 90-99% on room air.  Chest x-ray with cardiomegaly and small left pleural effusion.  CT of head is negative for acute intracranial abnormalities.  CT of C-spine showed type II odontoid fracture.  C-collar was placed.  Pt is placed on progressive bed for observation.  Dr. Rockey Situ of cardiology is consulted.  Dr. Izora Ribas of neurosurgery is consulted by EDP.  CT of C spin: Fracture through the base of the odontoid consistent with a type 2 odontoid fracture.  Multilevel degenerative changes are noted. No other fractures are seen.  Review of Systems:   General: no fevers, chills, no body weight gain,  fatigue HEENT: no blurry vision, hearing changes or sore throat Respiratory: no dyspnea, coughing, wheezing CV: no chest pain, no palpitations GI: no nausea, vomiting, abdominal pain, diarrhea, constipation GU: no dysuria, burning on urination, increased urinary frequency, hematuria  Ext: has leg edema Neuro: no unilateral weakness, numbness, or tingling, no vision change or hearing loss. Has fall. Skin: no rash, has skin tear in right lower leg MSK: No muscle spasm, no deformity, no limitation of range of movement in spin. Has neck pain Heme: No easy bruising.  Travel history: No recent long distant travel.  Allergy:  Allergies  Allergen Reactions   Atorvastatin     REACTION: raises blood pressure   Pravastatin Sodium     REACTION: raises blood pressure   Statins     REACTION: raises bp    Past Medical History:  Diagnosis Date   Actinic keratosis    BPH (benign prostatic hypertrophy)    Diseases of lips    Diverticulosis of colon 06/2003   ED (erectile dysfunction)    GERD (gastroesophageal reflux disease)    Heart murmur    as child   HLD (hyperlipidemia)    HOH (hard of hearing)    Cochlear implant right, hearing aid left   HTN (hypertension)    Numbness and tingling in both hands    intermittent, trying braces    Osteoarthritis of knee    severe, bilat   Renal insufficiency    Restless legs syndrome (RLS)    Sleep disturbance, unspecified  Spinal stenosis, lumbar region, without neurogenic claudication     Past Surgical History:  Procedure Laterality Date   CATARACT EXTRACTION, BILATERAL  2011   Dr. Thomasene Ripple   COCHLEAR IMPLANT Right 02/15/2017   Procedure: RIGHT COCHLEAR IMPLANT;  Surgeon: Vicie Mutters, MD;  Location: [facility];  Service: ENT;  Laterality: Right;   FINGER SURGERY     Right 4th finger   LIH  10/2009   Dr. Bary Castilla   LUMBAR SPINE SURGERY  06/2005   stenosis repair   PTOSIS REPAIR Bilateral 04/19/2019   Procedure:  BLEPHAROPTOSIS REPAIR; RESECT SKIN;  Surgeon: Karle Starch, MD;  Location: [facility];  Service: Ophthalmology;  Laterality: Bilateral;   TONSILLECTOMY  childhood   TOTAL KNEE ARTHROPLASTY  06/2008   bilat (Dr. Marry Guan)    Social History:  reports that he quit smoking about 50 years ago. His smoking use included cigarettes. He has a 22.50 pack-year smoking history. He has never used smokeless tobacco. He reports current alcohol use. He reports that he does not use drugs.  Family History:  Family History  Problem Relation Age of Onset   Other Father        "clogged arteries"   Hypertension Mother      Prior to Admission medications   Medication Sig Start Date End Date Taking? Authorizing Provider  acetaminophen (TYLENOL) 325 MG tablet Take 2 tablets (650 mg total) by mouth every 6 (six) hours as needed for mild pain (or Fever >/= 101). 09/28/20   Barb Merino, MD  apixaban (ELIQUIS) 2.5 MG TABS tablet Take 1 tablet (2.5 mg total) by mouth 2 (two) times daily. 01/05/20   Dessa Phi, DO  buPROPion (WELLBUTRIN XL) 150 MG 24 hr tablet TAKE 1 TABLET BY MOUTH DAILY 03/02/20   [name], MD  clonazePAM (KLONOPIN) 0.5 MG tablet TAKE ONE TO TWO TABLETS AT BEDTIME 03/02/20   [name], MD  finasteride (PROSCAR) 5 MG tablet TAKE ONE TABLET BY MOUTH EVERY DAY 08/05/19   [name], MD  furosemide (LASIX) 40 MG tablet Take 1 tablet (40 mg total) by mouth daily. If home weight is over 175#--take a second dose after lunch 09/28/20 10/28/20  Barb Merino, MD  furosemide (LASIX) 80 MG tablet Take 80 mg by mouth daily.    [provider]  Multiple Vitamin (MULTIVITAMIN) capsule Take 1 capsule by mouth daily.    [provider]  omeprazole (PRILOSEC) 40 MG capsule TAKE 1 CAPSULE BY MOUTH ONCE DAILY 03/30/20   [name], MD  potassium chloride (KLOR-CON) 10 MEQ tablet Take 1 tablet (10 mEq total) by mouth 2 (two) times daily for 14 days. 09/28/20 10/12/20   Barb Merino, MD  pramipexole (MIRAPEX) 1 MG tablet TAKE ONE TABLET AT [facility] AND ONE TABLETAT BEDTIME 09/16/19   [name], MD  pyridoxine (B-6) 100 MG tablet Take 100 mg by mouth daily.    [provider]  tamsulosin (FLOMAX) 0.4 MG CAPS capsule TAKE 1 CAPSULE EVERY DAY 08/05/19   [name], MD  traZODone (DESYREL) 50 MG tablet Take 0.5 tablets (25 mg total) by mouth at bedtime as needed for sleep. 11/26/20   Jearld Fenton, NP  citalopram (CELEXA) 10 MG tablet Take 1 tablet (10 mg total) by mouth daily. 04/22/11 05/18/11  [name], MD    Physical Exam: Vitals:   12/03/20 0630 12/03/20 0730 12/03/20 0800 12/03/20 0900  BP: (!) 177/95 (!) 165/97 (!) 169/103 Marland Kitchen)  186/128  Pulse: (!) 56 (!) 46 (!) 55 (!) 59  Resp: 18 16 (!) 9 19  Temp:      TempSrc:      SpO2: 93% 96% 100% 99%  Weight:      Height:       General: Not in acute distress HEENT:       Eyes: PERRL, EOMI, no scleral icterus.       ENT: No discharge from the ears and nose, no pharynx injection, no tonsillar enlargement.        Neck: No JVD, no bruit, no mass felt. Heme: No neck lymph node enlargement. Cardiac: S1/S2, RRR, No gallops or rubs. Respiratory: No rales, wheezing, rhonchi or rubs. GI: Soft, nondistended, nontender, no rebound pain, no organomegaly, BS present. GU: No hematuria Ext:  1+DP/PT pulse bilaterally. He has chronic bilateral lower extremity swelling and weeping, with small area of skin tear in right lower leg    Musculoskeletal: has neck pain with C-collar in place Skin: No rashes.  Neuro: Alert, oriented X3, cranial nerves II-XII grossly intact, moves all extremities normally. Psych: Patient is not psychotic, no suicidal or hemocidal ideation.  Labs on Admission: I have personally reviewed following labs and imaging studies  CBC: Recent Labs  Lab 12/03/20 0000  WBC 6.5  NEUTROABS 4.7  HGB 12.8*  HCT 40.2  MCV 96.9  PLT 650*   Basic Metabolic Panel: Recent  Labs  Lab 12/03/20 0000  NA 141  K 3.2*  CL 104  CO2 25  GLUCOSE 94  BUN 47*  CREATININE 2.31*  CALCIUM 9.0   GFR: Estimated Creatinine Clearance: 17.9 mL/min (A) (by C-G formula based on SCr of 2.31 mg/dL (H)). Liver Function Tests: No results for input(s): AST, ALT, ALKPHOS, BILITOT, PROT, ALBUMIN in the last 168 hours. No results for input(s): LIPASE, AMYLASE in the last 168 hours. No results for input(s): AMMONIA in the last 168 hours. Coagulation Profile: No results for input(s): INR, PROTIME in the last 168 hours. Cardiac Enzymes: No results for input(s): CKTOTAL, CKMB, CKMBINDEX, TROPONINI in the last 168 hours. BNP (last 3 results) No results for input(s): PROBNP in the last 8760 hours. HbA1C: No results for input(s): HGBA1C in the last 72 hours. CBG: No results for input(s): GLUCAP in the last 168 hours. Lipid Profile: No results for input(s): CHOL, HDL, LDLCALC, TRIG, CHOLHDL, LDLDIRECT in the last 72 hours. Thyroid Function Tests: No results for input(s): TSH, T4TOTAL, FREET4, T3FREE, THYROIDAB in the last 72 hours. Anemia Panel: No results for input(s): VITAMINB12, FOLATE, FERRITIN, TIBC, IRON, RETICCTPCT in the last 72 hours. Urine analysis:    Component Value Date/Time   COLORURINE YELLOW (A) 12/03/2020 0143   APPEARANCEUR CLEAR (A) 12/03/2020 0143   LABSPEC 1.012 12/03/2020 0143   PHURINE 5.0 12/03/2020 0143   GLUCOSEU NEGATIVE 12/03/2020 0143   HGBUR NEGATIVE 12/03/2020 0143   HGBUR large 09/03/2008 0933   BILIRUBINUR NEGATIVE 12/03/2020 0143   BILIRUBINUR Negative 08/04/2017 1446   KETONESUR NEGATIVE 12/03/2020 0143   PROTEINUR 30 (A) 12/03/2020 0143   UROBILINOGEN 0.2 08/04/2017 1446   UROBILINOGEN 0.2 09/03/2008 0933   NITRITE NEGATIVE 12/03/2020 0143   LEUKOCYTESUR NEGATIVE 12/03/2020 0143   Sepsis Labs: @LABRCNTIP (procalcitonin:4,lacticidven:4) )No results found for this or any previous visit (from the past 240 hour(s)).   Radiological  Exams on Admission: DG Chest 2 View  Result Date: 12/03/2020 CLINICAL DATA:  Multiple falls and difficulty breathing. EXAM: CHEST - 2 VIEW COMPARISON:  September 26, 2020  FINDINGS: Mild, diffuse, chronic appearing increased interstitial lung markings are seen. There is no evidence of acute infiltrate or pneumothorax. A small left pleural effusion is seen. The cardiac silhouette is markedly enlarged and unchanged in size. There is moderate severity calcification of the aortic arch. Degenerative changes seen throughout the thoracic spine. IMPRESSION: Stable cardiomegaly with a small left pleural effusion. Electronically Signed   By: Virgina Norfolk M.D.   On: 12/03/2020 00:36   CT HEAD WO CONTRAST (5MM)  Addendum Date: 12/03/2020   ADDENDUM REPORT: 12/03/2020 00:59 ADDENDUM: Critical Value/emergent results were called by telephone at the time of interpretation on 12/03/2020 at 12:55 am to Dr. Pryor Curia , who verbally acknowledged these results. Electronically Signed   By: Inez Catalina M.D.   On: 12/03/2020 00:59   Result Date: 12/03/2020 CLINICAL DATA:  Recent falls with altered mental status, known blood thinners EXAM: CT HEAD WITHOUT CONTRAST TECHNIQUE: Contiguous axial images were obtained from the base of the skull through the vertex without intravenous contrast. COMPARISON:  02/02/2019 FINDINGS: Brain: No evidence of acute infarction, hemorrhage, hydrocephalus, extra-axial collection or mass lesion/mass effect. Chronic atrophic and ischemic changes are noted. Vascular: No hyperdense vessel or unexpected calcification. Skull: Normal. Negative for fracture or focal lesion. Sinuses/Orbits: No acute finding. 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 IMPRESSION: Chronic atrophic and ischemic changes without acute intracranial abnormality. Electronically Signed: By: Inez Catalina M.D. On: 12/03/2020 00:51   CT Cervical Spine Wo Contrast  Result Date: 12/03/2020 CLINICAL DATA:  Recent falls, initial  encounter EXAM: CT CERVICAL SPINE WITHOUT CONTRAST TECHNIQUE: Multidetector CT imaging of the cervical spine was performed without intravenous contrast. Multiplanar CT image reconstructions were also generated. COMPARISON:  02/02/2020 FINDINGS: Alignment: Loss of normal cervical lordosis is noted. Skull base and vertebrae: 7 cervical segments are well visualized. There is a type 2 fracture through the base of the odontoid identified. Minimal displacement of the superior fracture fragment is noted by approximately 3 mm. No significant posterior displacement is seen. Multilevel facet hypertrophic changes and osteophytic changes are seen throughout the cervical spine. No other fracture is noted. Multilevel neural foraminal narrowing is noted bilaterally. Soft tissues and spinal canal: Surrounding soft tissue structures appear within normal limits. Vascular calcifications are seen Upper chest: Visualized lung apices are within normal limits. Other: None IMPRESSION: Fracture through the base of the odontoid consistent with a type 2 odontoid fracture. Multilevel degenerative changes are noted. No other fractures are seen. Critical Value/emergent results were called by telephone at the time of interpretation on 12/03/2020 at 12:55 am to Dr. Pryor Curia , who verbally acknowledged these results. Electronically Signed   By: Inez Catalina M.D.   On: 12/03/2020 00:57     EKG: I have personally reviewed.  Atrial fibrillation, QTC 429, low voltage, RAD, poor R wave progression, PVC  Assessment/Plan Principal Problem:   Elevated troponin Active Problems:   Chronic kidney disease, stage IV (severe) (HCC)   BPH with obstruction/lower urinary tract symptoms   Restless legs syndrome   TIA (transient ischemic attack)   Hypokalemia   A-fib (HCC)   Chronic diastolic heart failure (HCC)   HTN (hypertension)   HLD (hyperlipidemia)   Fall   Odontoid fracture (HCC)   Ventricular tachycardia   Depression with  anxiety   Elevated troponin: Troponin level 216 --> 223.  Denies chest pain.  Patient had elevation of troponin in the past, 318-396 in January.  Possibly due to demand ischemia and decreased clearance in  the setting of CKD-IV.  Dr. Rockey Situ of cardiology is consulted.  - place to progressive unit for observation - Trend Trop - Repeat EKG in the am  - prn Nitroglycerin - pt is on Eliquis, will not start ASA - pt is allergic to statin - Risk factor stratification: will check FLP and A1C   Fall and odontoid fracture: CT showed fracture through the base of the odontoid consistent with a type 2 odontoid fracture.  ED physician consulted Dr. Izora Ribas of neurosurgery, he recommended keeping patient in cervical collar and he will schedule outpatient follow-up -C-collar is placed -prn tylenol and percocet for pain  Chronic kidney disease, stage IV (severe) ([place]): Stable.  Baseline creatinine 2.2-2.4.  His creatinine is 2.31, BUN 47 today -f/u with BMP  BPH with obstruction/lower urinary tract symptoms -Flomax and Proscar  Restless legs syndrome -Pramipexole  TIA (transient ischemic attack):  -On Eliquis for A. Fib  Hypokalemia: Potassium 3.2 -Repleted potassium -Check magnesium level  A-fib [facility]): Currently patient has bradycardia, heart rate 40-50s -Continue Eliquis  Chronic diastolic heart failure ([place]): 2D echo on 02/03/2020 showed EF 60 to 65%.  Patient denies shortness of breath to me.  Patient has bilateral leg edema with wheezing, but no pulm edema on chest x-ray.  Does not seem to have CHF exacerbation -Continue high-dose Lasix 80 mg daily -Consult wound care due to skin tear in right lower leg  HTN (hypertension): Blood pressure 186/128 -IV hydralazine as needed -Patient is on oral Lasix  HLD (hyperlipidemia): Patient is allergic to statin -Follow-up FLP  Ventricular tachycardia: Patient had 1 episode of V. tach which lasted for about 10 seconds per ED physician.   Currently heart rate 40-50s. -Follow-up with cardiologist recommendations -Cardiac monitoring  Depression with anxiety -Continue home medications      DVT ppx: on Eliquis Code Status: DNR (pt has DNR form from facility) Family Communication:    Yes, patient's daughter isposition Plan:  Anticipate discharge back to previous environment Consults called:  Dr. Rockey Situ of cardiology is consulted.  Dr. Izora Ribas of neurosurgery is consulted by EDP. Admission status and Level of care: Progressive Cardiac:     for obs     Status is: Observation  The patient remains OBS appropriate and will d/c before 2 midnights.  Dispo: The patient is from: [facility]              Anticipated d/c is to: [facility]              Patient currently is not medically stable to d/c.   Difficult to place patient No            Date of Service 12/03/2020    Ivor Costa [facility]   If 7PM-7AM, please contact night-coverage [email or web address] 12/03/2020, 9:40 AM

## 2020-12-03 NOTE — ED Notes (Addendum)
cervical collar applied. NADN

## 2020-12-03 NOTE — ED Notes (Signed)
Adora Fridge, Mudlogger of nursing @ [facility] [phone] and informed that pt went into Vtwith HR of 170 and that pt will be admitted rather than discharged.

## 2020-12-03 NOTE — ED Provider Notes (Signed)
-----------------------------------------   9:04 AM on 12/03/2020 ----------------------------------------- While waiting on transport back to his facility patient has [place] is a gone into ventricular tachycardia lasting approximately 10 seconds before reverting back to a normal sinus rhythm.  Given the patient's elevated troponin, fall now run of ventricular tachycardia we will admit the patient for continued monitoring on telemetry.   Harvest Dark, MD 12/03/20 [phone]

## 2020-12-03 NOTE — ED Notes (Signed)
Assisted pt to walk with walker in room. Pt at baseline with walking. Pt has c collar in place. Spoke with Mudlogger of nursing at [facility] and had phone in hand while walking pt. Pt is alert, oriented with cognition at baseline. Pt back in bed, waiting for transport  to arrive.

## 2020-12-04 DIAGNOSIS — I4821 Permanent atrial fibrillation: Secondary | ICD-10-CM

## 2020-12-04 DIAGNOSIS — R778 Other specified abnormalities of plasma proteins: Secondary | ICD-10-CM

## 2020-12-04 DIAGNOSIS — S12110A Anterior displaced Type II dens fracture, initial encounter for closed fracture: Secondary | ICD-10-CM | POA: Diagnosis not present

## 2020-12-04 LAB — LIPID PANEL
Cholesterol: 164 mg/dL (ref 0–200)
HDL: 33 mg/dL — ABNORMAL LOW (ref >40)
LDL Cholesterol: 112 mg/dL — ABNORMAL HIGH (ref 0–99)
Total CHOL/HDL Ratio: 5 RATIO
Triglycerides: 96 mg/dL (ref <150)
VLDL: 19 mg/dL (ref 0–40)

## 2020-12-04 LAB — BASIC METABOLIC PANEL
Anion gap: 10 (ref 5–15)
BUN: 43 mg/dL — ABNORMAL HIGH (ref 8–23)
CO2: 29 mmol/L (ref 22–32)
Calcium: 8.7 mg/dL — ABNORMAL LOW (ref 8.9–10.3)
Chloride: 99 mmol/L (ref 98–111)
Creatinine, Ser: 2 mg/dL — ABNORMAL HIGH (ref 0.61–1.24)
GFR, Estimated: 30 mL/min — ABNORMAL LOW (ref >60)
Glucose, Bld: 105 mg/dL — ABNORMAL HIGH (ref 70–99)
Potassium: 3 mmol/L — ABNORMAL LOW (ref 3.5–5.1)
Sodium: 138 mmol/L (ref 135–145)

## 2020-12-04 LAB — CBC
HCT: 39.2 % (ref 39.0–52.0)
Hemoglobin: 12.6 g/dL — ABNORMAL LOW (ref 13.0–17.0)
MCH: 30.7 pg (ref 26.0–34.0)
MCHC: 32.1 g/dL (ref 30.0–36.0)
MCV: 95.6 fL (ref 80.0–100.0)
Platelets: 137 10*3/uL — ABNORMAL LOW (ref 150–400)
RBC: 4.1 MIL/uL — ABNORMAL LOW (ref 4.22–5.81)
RDW: 16.3 % — ABNORMAL HIGH (ref 11.5–15.5)
WBC: 6.7 10*3/uL (ref 4.0–10.5)
nRBC: 0 % (ref 0.0–0.2)

## 2020-12-04 LAB — MAGNESIUM: Magnesium: 1.9 mg/dL (ref 1.7–2.4)

## 2020-12-04 LAB — HEMOGLOBIN A1C
Hgb A1c MFr Bld: 6 % — ABNORMAL HIGH (ref 4.8–5.6)
Mean Plasma Glucose: 126 mg/dL

## 2020-12-04 MED ORDER — MAGNESIUM SULFATE 2 GM/50ML IV SOLN: 2.0000 g | Freq: Once | INTRAVENOUS | Status: DC

## 2020-12-04 MED ORDER — POTASSIUM CHLORIDE CRYS ER 20 MEQ PO TBCR
40.0000 meq | EXTENDED_RELEASE_TABLET | ORAL | Status: AC
  Administered 2020-12-04 (×2): 40 meq via ORAL
  Filled 2020-12-04: qty 2

## 2020-12-04 NOTE — Evaluation (Addendum)
Physical Therapy Evaluation Patient Details Name: Stephen Garrett MRN: [id number] DOB: Jul 07, 1923 Today's Date: 12/04/2020  History of Present Illness  Pt is a 87 y.o. arriving to ED after a fall leading to odontoid type 2 fx. PMH includes HTN, TIA, CKD, HOH, dementia, dCHF, a-fib  Clinical Impression  Pt alert, oriented x 3 in bed without cervical collar. Donned collar and provided pt education on continual usage. Pt is HOH and required written communication for history. Pt indicates having several falls, although unable to formally state reasoning or descriptions of sx prior to fall. Primary limitations include weakness requiring MIN-A for bed mobility and STS from average surface height. Pt demonstrates significant decrease in gait speed which pt attributes to "being careful" following most recent fall. No LOB or instability noted w/ amb but pt fatigues easily and requires cues to keep RW in BOS. HR monitored throughout session:  RHR 70s, mid 80s w/ amb, SpO2 94-96%. Discharge recommendations are SNF w/ supervision for OOB mobility. Skilled PT intervention is indicated to address deficits in function, mobility, and to return to PLOF as able.       Recommendations for follow up therapy are one component of a multi-disciplinary discharge planning process, led by the attending physician.  Recommendations may be updated based on patient status, additional functional criteria and insurance authorization.  Follow Up Recommendations Supervision for mobility/OOB;SNF    Equipment Recommendations  Rolling walker with 5" wheels    Recommendations for Other Services       Precautions / Restrictions Precautions Precautions: Cervical;Fall Required Braces or Orthoses: Cervical Brace Cervical Brace: At all times;Hard collar Restrictions Weight Bearing Restrictions: No      Mobility  Bed Mobility Overal bed mobility: Needs Assistance Bed Mobility: Sit to Supine;Supine to Sit     Supine to sit:  Min assist Sit to supine: Min assist   General bed mobility comments: requires trunk support and unable to bridge and reposition to [facility]    Transfers Overall transfer level: Needs assistance Equipment used: Rolling walker (2 wheeled) Transfers: Sit to/from Stand Sit to Stand: Min guard;From elevated surface;Min assist         General transfer comment: MIN A from lowered surface height, MIN-G from elevated height; cues for handplacement on RW  Ambulation/Gait Ambulation/Gait assistance: Min guard Gait Distance (Feet): 45 Feet Assistive device: Rolling walker (2 wheeled) Gait Pattern/deviations: Trunk flexed;Decreased step length - right;Decreased step length - left Gait velocity: Significant decreased   General Gait Details: pushes RW outside BOS when fatigued, cues for correction, MIN G x1 for assistance to RW for turning;  Stairs            Wheelchair Mobility    Modified Rankin (Stroke Patients Only)       Balance Overall balance assessment: Needs assistance Sitting-balance support: Bilateral upper extremity supported;Feet supported Sitting balance-Leahy Scale: Good Sitting balance - Comments: weight shifts from one hip to another without LOB     Standing balance-Leahy Scale: Poor Standing balance comment: requires BUE for support ;no LOB with static and dynamic tasks w/ support                             Pertinent Vitals/Pain Pain Assessment: Faces Faces Pain Scale: Hurts even more Pain Location: R ankle Pain Descriptors / Indicators: Aching;Discomfort;Grimacing Pain Intervention(s): Limited activity within patient's tolerance;Monitored during session;Repositioned    Home Living Family/patient expects to be discharged to:: Assisted living  Home Equipment: Kasandra Knudsen - single point;Walker - 2 wheels Additional Comments: [facility]    Prior Function Level of Independence: Needs assistance         Comments: Pt is HOH,  missing one hearing aid and largely unable to communicate PLOF. Pt does state usage of RW recently due to hx of falls. [place] regarding PLOF, no repsonse received.     Hand Dominance   Dominant Hand: Left    Extremity/Trunk Assessment   Upper Extremity Assessment Upper Extremity Assessment: Generalized weakness    Lower Extremity Assessment Lower Extremity Assessment: Generalized weakness       Communication   Communication: HOH  Cognition Arousal/Alertness: Awake/alert Behavior During Therapy: WFL for tasks assessed/performed Overall Cognitive Status: History of cognitive impairments - at baseline                                 General Comments: Oriented to name, DOB, situation; disoriented to location; follows 1 step commands consistently`      General Comments General comments (skin integrity, edema, etc.): HR 70s at rest, mid 80s w/ amb, SpO2 94-96%    Exercises Other Exercises Other Exercises: Standing marching x 10 w/ RW, supervision HR 70s, no LOB   Assessment/Plan    PT Assessment Patient needs continued PT services  PT Problem List Decreased strength;Decreased range of motion;Decreased activity tolerance;Decreased balance;Decreased mobility;Decreased coordination       PT Treatment Interventions Balance training;Gait training;Stair training;Functional mobility training;Therapeutic activities;Therapeutic exercise;Neuromuscular re-education    PT Goals (Current goals can be found in the Care Plan section)  Acute Rehab PT Goals Patient Stated Goal: To go home PT Goal Formulation: With patient Time For Goal Achievement: 12/18/20 Potential to Achieve Goals: Good    Frequency Min 2X/week   Barriers to discharge        Co-evaluation               AM-PAC PT "6 Clicks" Mobility  Outcome Measure Help needed turning from your back to your side while in a flat bed without using bedrails?: A Little Help needed moving from  lying on your back to sitting on the side of a flat bed without using bedrails?: A Little Help needed moving to and from a bed to a chair (including a wheelchair)?: A Little Help needed standing up from a chair using your arms (e.g., wheelchair or bedside chair)?: A Little Help needed to walk in hospital room?: A Little Help needed climbing 3-5 steps with a railing? : A Lot 6 Click Score: 17    End of Session Equipment Utilized During Treatment: Gait belt;Cervical collar Activity Tolerance: Patient tolerated treatment well Patient left: in bed;with call bell/phone within reach;with bed alarm set Nurse Communication: Mobility status PT Visit Diagnosis: History of falling (Z91.81);Muscle weakness (generalized) (M62.81);Repeated falls (R29.6)    Time: 1051-1130 PT Time Calculation (min) (ACUTE ONLY): 39 min   Charges:             [employer], SPT

## 2020-12-04 NOTE — Plan of Care (Signed)
  Problem: Clinical Measurements: Goal: Respiratory complications will improve Outcome: Progressing   Problem: Clinical Measurements: Goal: Cardiovascular complication will be avoided Outcome: Progressing   Problem: Safety: Goal: Ability to remain free from injury will improve Outcome: Progressing   

## 2020-12-04 NOTE — TOC Transition Note (Addendum)
Transition of Care [facility]) - CM/SW Discharge Note   Patient Details  Name: Stephen Garrett MRN: [id number] Date of Birth: May 24, 1923  Transition of Care [facility]) CM/SW Contact:  Alberteen Sam, LCSW Phone Number: 12/04/2020, 2:43 PM   Clinical Narrative:     Patient will DC to: [facility] (memory/respite care) Anticipated DC date: 12/04/20 Family notified: daughter [name]   Per MD patient ready for DC to [facility] . RN, patient, patient's family, and facility notified of DC. RN given number for report  [phone] Room 210.  [facility] has been called, discharge summary and fl2 sent to facility.   Pricilla Riffle, LCSW    Final next level of care: Skilled Nursing Facility Barriers to Discharge: No Barriers Identified   Patient Goals and CMS Choice Patient states their goals for this hospitalization and ongoing recovery are:: to go home CMS Medicare.gov Compare Post Acute Care list provided to:: Patient Represenative (must comment) (daughter [name]) Choice offered to / list presented to : Adult Children  Discharge Placement              Patient chooses bed at: [facility] Patient to be transferred to facility by: family Name of family member notified: Elizabetth Patient and family notified of of transfer: 12/04/20  Discharge Plan and Services                                     Social Determinants of Health (SDOH) Interventions     Readmission Risk Interventions No flowsheet data found.

## 2020-12-04 NOTE — Evaluation (Signed)
Occupational Therapy Evaluation Patient Details Name: Stephen Garrett MRN: [id number] DOB: 04/27/1923 Today's Date: 12/04/2020   History of Present Illness Pt is a 87 y.o. arriving to ED after a fall leading to odontoid type 2 fx. PMH includes HTN, TIA, CKD, HOH, dementia, dCHF, a-fib   Clinical Impression   Pt seen for OT evaluation this date. Prior to hospital admission, pt was modified independent with mobility using a RW, indep with dressing and toileting, and requiring assist for bathing from ALF Staff (although pt endorses sometimes taking showers without staff, even though he knows he should get assist.) Dtr present and endorses pt has had 3 falls just within the past 1 1/2 weeks. Pt received in bed, semi reclined and having just finished his lunch with C collar off. OT put C collar back on and educated pt/dtr in wear schedule. Pt did not recall previous instruction from PT. Daughter shares with OT that in order to return to ALF, pt will be required to be modified independent with a walker to get from his room to the dining hall (using an elevator) for 2 meals/day and complete dressing and toileting independently.   Currently pt requires Min A for bed mobility, CGA-Min A for ADL transfers from elevated EOB and from [facility] over toilet + VC for hand/foot placement, MAX A for donning/doffing socks, and MOD A for LB dressing and bathing. Pt/dtr educated in cervical precautions and C collar wear schedule. Pt/dtr verbalized understanding but pt would benefit from additional education/training to maximize recall and carryover. Given the impairments and ADL/mobility implications, pt is currently not at baseline independence and per ALF's guidelines, unsafe to return to ALF setting. Recommend short term rehab to maximize pt's return to PLOF and minimize caregiver burden or need for long term higher level of care. Care team notified.      Recommendations for follow up therapy are one component of a  multi-disciplinary discharge planning process, led by the attending physician.  Recommendations may be updated based on patient status, additional functional criteria and insurance authorization.   Follow Up Recommendations  SNF;Other (comment) (sup/assist for ADL/mobility)    Equipment Recommendations  3 in 1 bedside commode    Recommendations for Other Services Speech consult     Precautions / Restrictions Precautions Precautions: Cervical;Fall Required Braces or Orthoses: Cervical Brace Cervical Brace: At all times;Hard collar (per PA note, can take off for meals to minimize risk of aspiration.) Restrictions Weight Bearing Restrictions: No Other Position/Activity Restrictions: [place]      Mobility Bed Mobility Overal bed mobility: Needs Assistance Bed Mobility: Sit to Supine;Supine to Sit     Supine to sit: Min assist;HOB elevated Sit to supine: Min assist   General bed mobility comments: assist for trunk support, BLE mgt    Transfers Overall transfer level: Needs assistance Equipment used: Rolling walker (2 wheeled) Transfers: Sit to/from Stand Sit to Stand: Min guard;From elevated surface;Min assist         General transfer comment: cues for hand/foot placement    Balance Overall balance assessment: Needs assistance Sitting-balance support: Bilateral upper extremity supported;Feet supported Sitting balance-Leahy Scale: Good Sitting balance - Comments: weight shifts from one hip to another without LOB   Standing balance support: Bilateral upper extremity supported;During functional activity Standing balance-Leahy Scale: Poor Standing balance comment: requires BUE for support ;no LOB with static and dynamic tasks w/ support  ADL either performed or assessed with clinical judgement   ADL Overall ADL's : Needs assistance/impaired                                       General ADL Comments: Pt requires MAX  A for socks mgt, Mod A for LB dressing otherwise; Min A for UB dressing, CGA-MIN A for ADL transfers + RW + VC for hand/foot placement from elevated surface and BSC over toilet, MAX A for C collar mgt     Vision         Perception     Praxis      Pertinent Vitals/Pain Pain Assessment: Faces Faces Pain Scale: Hurts little more Pain Location: R ankle Pain Descriptors / Indicators: Aching;Discomfort;Grimacing Pain Intervention(s): Limited activity within patient's tolerance;Monitored during session;Repositioned     Hand Dominance Left   Extremity/Trunk Assessment Upper Extremity Assessment Upper Extremity Assessment: Generalized weakness   Lower Extremity Assessment Lower Extremity Assessment: Generalized weakness (bangages noted on pt's R arm near elbow, on BLE)   Cervical / Trunk Assessment Cervical / Trunk Assessment: Kyphotic   Communication Communication Communication: HOH   Cognition Arousal/Alertness: Awake/alert Behavior During Therapy: WFL for tasks assessed/performed Overall Cognitive Status: History of cognitive impairments - at baseline                                 General Comments: Pt alert and oriented x3, follows simple commands well, decreased STM/recall of precautions regarding C collar   General Comments      Exercises  Other Exercises: Pt/dtr instructed in C collar mgt and per PA note able to remove for meals to minimize risk of aspiration. Educated in falls prevention, ADL transfers, RW mgt   Shoulder Instructions      Home Living Family/patient expects to be discharged to:: Assisted living                             Home Equipment: Kasandra Knudsen - single point;Walker - 2 wheels   Additional Comments: [facility] - per dtr, in order to return to ALF, pt must be modified independent with mobility using RW, be able to go from his room to the dining hall 2x/day ([employer] officer, political party), and be independent with toileting.       Prior Functioning/Environment Level of Independence: Needs assistance  Gait / Transfers Assistance Needed: Pt does state usage of RW recently due to hx of falls. ADL's / Homemaking Assistance Needed: Per pt/dtr, he was indep with dressing and toileting, requiring assist for showering (but pt endorses sometimes showering without assist); ALF provides 2 meals/day in the dining room, meds, and house keeping.   Comments: Pt is HOH, missing one hearing aid and largely unable to communicate PLOF.        OT Problem List: Decreased strength;Pain;Decreased range of motion;Decreased cognition;Decreased safety awareness;Impaired balance (sitting and/or standing);Decreased activity tolerance;Decreased knowledge of use of DME or AE;Decreased knowledge of precautions      OT Treatment/Interventions: Self-care/ADL training;Therapeutic exercise;Therapeutic activities;Cognitive remediation/compensation;DME and/or AE instruction;Patient/family education    OT Goals(Current goals can be found in the care plan section) Acute Rehab OT Goals Patient Stated Goal: To go home OT Goal Formulation: With patient/family Time For Goal Achievement: 12/18/20 Potential to Achieve Goals: Good ADL Goals Pt Will Perform  Upper Body Dressing: sitting;with supervision;with set-up (maintaining cervical precautions/collar) Pt Will Perform Lower Body Dressing: sit to/from stand;with min assist Pt Will Transfer to Toilet: with supervision;ambulating (elevated commode, LRAD for amb, C collar on) Additional ADL Goal #1: Pt will maintain cervical collar during seated/standing dressing/grooming tasks 5/5 opportunities.  OT Frequency: Min 2X/week   Barriers to D/C:            Co-evaluation              AM-PAC OT "6 Clicks" Daily Activity     Outcome Measure Help from another person eating meals?: A Little Help from another person taking care of personal grooming?: A Little Help from another person toileting, which  includes using toliet, bedpan, or urinal?: A Little Help from another person bathing (including washing, rinsing, drying)?: A Lot Help from another person to put on and taking off regular upper body clothing?: A Lot Help from another person to put on and taking off regular lower body clothing?: A Lot 6 Click Score: 15   End of Session Equipment Utilized During Treatment: Gait belt;Rolling walker;Cervical collar Nurse Communication: Mobility status;Other (comment) (C collar fit)  Activity Tolerance: Patient tolerated treatment well Patient left: in bed;with call bell/phone within reach;with bed alarm set;with family/visitor present;Other (comment) (C collar in place)  OT Visit Diagnosis: Other abnormalities of gait and mobility (R26.89);Repeated falls (R29.6);Muscle weakness (generalized) (M62.81);Pain Pain - Right/Left: Right Pain - part of body: Ankle and joints of foot (and neck)                Time: 6387-5643 OT Time Calculation (min): 35 min Charges:  OT General Charges $OT Visit: 1 Visit OT Evaluation $OT Eval Moderate Complexity: 1 Mod OT Treatments $Self Care/Home Management : 23-37 mins  Ardeth Perfect., MPH, MS, OTR/L ascom [phone] 12/04/20, 3:35 PM

## 2020-12-04 NOTE — NC FL2 (Signed)
[place] LEVEL OF CARE SCREENING TOOL     IDENTIFICATION  Patient Name: Stephen Garrett Birthdate: 10-17-1923 Sex: male Admission Date (Current Location): 12/02/2020  [facility] and [place] Number:  Engineering geologist and Address:  [facility], [address], [place], [place] [address]      Provider Number: [id number]  Attending Physician Name and Address:  Sidney Ace, MD  Relative Name and Phone Number:  [name] (daughter) [phone]    Current Level of Care: Hospital Recommended Level of Care: [place], Memory Care Prior Approval Number:    Date Approved/Denied:   PASRR Number: [id number]  Discharge Plan: Other (Comment) [facility] Memory [facility]/ ALF)    Current Diagnoses: Patient Active Problem List   Diagnosis Date Noted   Elevated troponin 12/03/2020   HTN (hypertension)    HLD (hyperlipidemia)    Fall    Odontoid fracture ([place])    Ventricular tachycardia    Depression with anxiety    Insomnia 11/26/2020   Right leg pain 09/27/2020   Bilateral lower extremity edema 09/27/2020   Anemia 09/27/2020   Vascular dementia without behavioral disturbance ([place]) 08/13/2020   Stasis ulcer ([place]) 08/13/2020   Chronic diastolic heart failure ([place]) 01/14/2020   Hypokalemia 01/01/2020   A-fib ([place]) 01/01/2020   Diplopia 09/25/2017   TIA (transient ischemic attack) 09/14/2017   Generalized weakness 08/04/2017   Retinal vein occlusion, central 02/19/2014   Chronic venous insufficiency 02/19/2014   Episodic mood disorder ([place]) 12/20/2013   Advanced directives, counseling/discussion 12/20/2013   Chronic kidney disease, stage IV (severe) ([place]) 01/26/2009   Restless legs syndrome 10/03/2008   Osteoarthritis 12/12/2006   SPINAL STENOSIS, LUMBAR 06/24/2006   Essential hypertension, benign 06/21/2006   GERD 06/21/2006   BPH with obstruction/lower urinary tract symptoms 06/21/2006    Orientation RESPIRATION  BLADDER Height & Weight     Self, Place, Situation  Normal Continent Weight: 171 lb 4.8 oz (77.7 kg) Height:  5\' 7"  (170.2 cm)  BEHAVIORAL SYMPTOMS/MOOD NEUROLOGICAL BOWEL NUTRITION STATUS      Continent Diet (see discharge summary)  AMBULATORY STATUS COMMUNICATION OF NEEDS Skin   Supervision Verbally Other (Comment) (blisters on right and left heels and elbows)                       Personal Care Assistance Level of Assistance  Bathing, Feeding, Dressing, Total care Bathing Assistance: Independent Feeding assistance: Independent Dressing Assistance: Independent Total Care Assistance: Limited assistance   Functional Limitations Info  Sight, Hearing, Speech Sight Info: Impaired Hearing Info: Impaired Speech Info: Adequate    SPECIAL CARE FACTORS FREQUENCY  PT (By licensed PT), OT (By licensed OT)     PT Frequency: min 4x weekly OT Frequency: min 4x weekly            Contractures Contractures Info: Not present    Additional Factors Info  Code Status, Allergies Code Status Info: DNR Allergies Info: Atorvastatin, pravastatin sodium, statins           Current Medications (12/04/2020):  This is the current hospital active medication list Current Facility-Administered Medications  Medication Dose Route Frequency Provider Last Rate Last Admin   acetaminophen (TYLENOL) tablet 650 mg  650 mg Oral Q6H PRN Ivor Costa, MD       apixaban Arne [place]) tablet 2.5 mg  2.5 mg Oral BID Ivor Costa, MD   2.5 mg at 12/04/20 1043   buPROPion (WELLBUTRIN XL) 24 hr tablet  150 mg  150 mg Oral Henreitta Cea, MD       clonazePAM Bobbye [place]) tablet 0.5 mg  0.5 mg Oral QHS Ivor Costa, MD   0.5 mg at 12/03/20 2300   finasteride (PROSCAR) tablet 5 mg  5 mg Oral Daily Ivor Costa, MD   5 mg at 12/04/20 1044   furosemide (LASIX) tablet 80 mg  80 mg Oral Daily Ivor Costa, MD   80 mg at 12/04/20 1043   hydrALAZINE (APRESOLINE) injection 5 mg  5 mg Intravenous Q2H PRN Ivor Costa, MD        hydrALAZINE (APRESOLINE) tablet 25 mg  25 mg Oral Q6H Gollan, Kathlene November, MD   25 mg at 12/04/20 6967   magnesium sulfate IVPB 2 g 50 mL  2 g Intravenous Once Ralene Muskrat B, MD       multivitamin with minerals tablet 1 tablet  1 tablet Oral Daily Noralee Space, RPH   1 tablet at 12/04/20 1043   nitroGLYCERIN (NITROSTAT) SL tablet 0.4 mg  0.4 mg Sublingual Q5 min PRN Ivor Costa, MD       ondansetron [facility]) injection 4 mg  4 mg Intravenous Q8H PRN Ivor Costa, MD       oxyCODONE-acetaminophen (PERCOCET/ROXICET) 5-325 MG per tablet 1 tablet  1 tablet Oral Q4H PRN Ivor Costa, MD   1 tablet at 12/04/20 0538   pantoprazole (PROTONIX) EC tablet 40 mg  40 mg Oral Daily Ivor Costa, MD   40 mg at 12/04/20 1044   pramipexole (MIRAPEX) tablet 0.5 mg  0.5 mg Oral Q supper Ivor Costa, MD   0.5 mg at 12/03/20 1743   pramipexole (MIRAPEX) tablet 1 mg  1 mg Oral QHS Ivor Costa, MD   1 mg at 12/03/20 2300   pyridOXINE (VITAMIN B-6) tablet 100 mg  100 mg Oral Daily Ivor Costa, MD   100 mg at 12/04/20 1047   tamsulosin (FLOMAX) capsule 0.4 mg  0.4 mg Oral BID Ivor Costa, MD   0.4 mg at 12/04/20 1044   traZODone (DESYREL) tablet 25 mg  25 mg Oral QHS PRN Ivor Costa, MD   25 mg at 12/04/20 0012     Discharge Medications: Please see discharge summary for a list of discharge medications.  Relevant Imaging Results:  Relevant Lab Results:   Additional Information SSN: [id number]  Alberteen Sam, LCSW

## 2020-12-04 NOTE — Progress Notes (Signed)
Called report to [facility].

## 2020-12-04 NOTE — Discharge Summary (Signed)
Physician Discharge Summary  HADDEN STEIG XIP:[id number] DOB: 10-05-1923 DOA: 12/02/2020  PCP: [name], MD  Admit date: 12/02/2020 Discharge date: 12/04/2020  Admitted From: ALF Disposition: Skilled nursing facility  Recommendations for Outpatient Follow-up:  Follow up with PCP in 1-2 weeks Follow-up with cardiology as directed  Home Health: No Equipment/Devices: Cervical collar  Discharge Condition: Stable CODE STATUS: DNR Diet recommendation: Regular  Brief/Interim Summary:  87 y.o. male with medical history significant of hypertension, hyperlipidemia, TIA, GERD, depression with anxiety, RLS, CKD stage IV, hard of hearing, BPH, dementia, dCHF, atrial fibrillation on Eliquis, who presents with fall.   Per report, pt had unwitnessed fall 2 days ago and again yesterday in facility. They state that they heard him banging on his door with his walker.  Unclear why he fell. Pt has mild pain his neck. No headache.  Patient reported shortness of breath to ED physician, but denies shortness of breath to me.  Patient also denies chest pain, cough, abdominal pain, nausea, vomiting, diarrhea.  No fever or chills.  No symptoms of UTI. He has chronic bilateral lower extremity swelling and weeping.  Seen in consultation by neurosurgery.  Recommend c-collar.  No surgical management recommended.  Seen in consultation by cardiology.  Chronically elevated troponins.  Not indicative of ACS.  No intervention indicated from their standpoint.  Patient essentially medically stable for discharge at this point.  On the day of discharge I had a lengthy conversation with the patient's daughter at bedside.  Patient is on Eliquis and per the daughter has had 3 falls in the last 1.5 weeks.  In my opinion the risks of a potentially life-threatening bleed while on Eliquis outweigh the benefits of CVA risk reduction in the setting of atrial fibrillation.  At time of discharge I will not make any changes in  medication regimen however I strongly recommend further discussion with primary care physician and cardiology on an outpatient basis to discuss possible discontinuation of anticoagulation in this elderly male with history of falls and dementia.   Discharge Diagnoses:  Principal Problem:   Elevated troponin Active Problems:   Chronic kidney disease, stage IV (severe) (HCC)   BPH with obstruction/lower urinary tract symptoms   Restless legs syndrome   TIA (transient ischemic attack)   Hypokalemia   A-fib (HCC)   Chronic diastolic heart failure (HCC)   HTN (hypertension)   HLD (hyperlipidemia)   Fall   Odontoid fracture (HCC)   Ventricular tachycardia   Depression with anxiety  Fall with odontoid fracture Neurosurgery consulted.  No surgical intervention recommended.  C-collar in place..  Pain control.  Follow-up outpatient neurosurgery.  Dr. Nelly Laurence office to arrange surgical follow-up.  Elevated troponin Chronically elevated troponins.  No intervention recommended by cardiology.  No changes made in medication regimen  Chronic kidney disease stage IV Stable  Discharge Instructions  Discharge Instructions     Diet - low sodium heart healthy   Complete by: As directed    Increase activity slowly   Complete by: As directed    Increase activity slowly   Complete by: As directed    No wound care   Complete by: As directed    No wound care   Complete by: As directed       Allergies as of 12/04/2020       Reactions   Atorvastatin    REACTION: raises blood pressure   Pravastatin Sodium    REACTION: raises blood pressure   Statins  REACTION: raises bp        Medication List     TAKE these medications    acetaminophen 325 MG tablet Commonly known as: TYLENOL Take 2 tablets (650 mg total) by mouth every 6 (six) hours as needed for mild pain (or Fever >/= 101).   apixaban 2.5 MG Tabs tablet Commonly known as: ELIQUIS Take 1 tablet (2.5 mg total) by mouth  2 (two) times daily.   buPROPion 150 MG 24 hr tablet Commonly known as: WELLBUTRIN XL TAKE 1 TABLET BY MOUTH DAILY What changed:  when to take this additional instructions   clonazePAM 0.5 MG tablet Commonly known as: KLONOPIN TAKE ONE TO TWO TABLETS AT BEDTIME   finasteride 5 MG tablet Commonly known as: PROSCAR TAKE ONE TABLET BY MOUTH EVERY DAY   furosemide 80 MG tablet Commonly known as: LASIX Take 80 mg by mouth daily. What changed: Another medication with the same name was removed. Continue taking this medication, and follow the directions you see here.   multivitamin capsule Take 1 capsule by mouth daily.   omeprazole 40 MG capsule Commonly known as: PRILOSEC TAKE 1 CAPSULE BY MOUTH ONCE DAILY   potassium chloride 10 MEQ tablet Commonly known as: KLOR-CON Take 1 tablet (10 mEq total) by mouth 2 (two) times daily for 14 days.   pramipexole 1 MG tablet Commonly known as: MIRAPEX TAKE ONE TABLET AT DINNER AND ONE TABLETAT BEDTIME What changed:  See the new instructions. Another medication with the same name was removed. Continue taking this medication, and follow the directions you see here.   pyridoxine 100 MG tablet Commonly known as: B-6 Take 100 mg by mouth daily.   tamsulosin 0.4 MG Caps capsule Commonly known as: FLOMAX TAKE 1 CAPSULE EVERY DAY What changed: when to take this   traZODone 50 MG tablet Commonly known as: DESYREL Take 0.5 tablets (25 mg total) by mouth at bedtime as needed for sleep.        Follow-up Information     Schedule an appointment as soon as possible for a visit  with Meade Maw, MD.   Specialty: Neurosurgery Contact information: [place] [address] [phone]                Allergies  Allergen Reactions   Atorvastatin     REACTION: raises blood pressure   Pravastatin Sodium     REACTION: raises blood pressure   Statins     REACTION: raises bp     Consultations: Neurosurgery Cardiology   Procedures/Studies: DG Chest 2 View  Result Date: 12/03/2020 CLINICAL DATA:  Multiple falls and difficulty breathing. EXAM: CHEST - 2 VIEW COMPARISON:  September 26, 2020 FINDINGS: Mild, diffuse, chronic appearing increased interstitial lung markings are seen. There is no evidence of acute infiltrate or pneumothorax. A small left pleural effusion is seen. The cardiac silhouette is markedly enlarged and unchanged in size. There is moderate severity calcification of the aortic arch. Degenerative changes seen throughout the thoracic spine. IMPRESSION: Stable cardiomegaly with a small left pleural effusion. Electronically Signed   By: Virgina Norfolk M.D.   On: 12/03/2020 00:36   CT HEAD WO CONTRAST (5MM)  Addendum Date: 12/03/2020   ADDENDUM REPORT: 12/03/2020 00:59 ADDENDUM: Critical Value/emergent results were called by telephone at the time of interpretation on 12/03/2020 at 12:55 am to Dr. Pryor Curia , who verbally acknowledged these results. Electronically Signed   By: Inez Catalina M.D.   On: 12/03/2020 00:59  Result Date: 12/03/2020 CLINICAL DATA:  Recent falls with altered mental status, known blood thinners EXAM: CT HEAD WITHOUT CONTRAST TECHNIQUE: Contiguous axial images were obtained from the base of the skull through the vertex without intravenous contrast. COMPARISON:  02/02/2019 FINDINGS: Brain: No evidence of acute infarction, hemorrhage, hydrocephalus, extra-axial collection or mass lesion/mass effect. Chronic atrophic and ischemic changes are noted. Vascular: No hyperdense vessel or unexpected calcification. Skull: Normal. Negative for fracture or focal lesion. Sinuses/Orbits: No acute finding. 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 IMPRESSION: Chronic atrophic and ischemic changes without acute intracranial abnormality. Electronically Signed: By: Inez Catalina M.D. On: 12/03/2020 00:51   CT Cervical Spine Wo Contrast  Result Date:  12/03/2020 CLINICAL DATA:  Recent falls, initial encounter EXAM: CT CERVICAL SPINE WITHOUT CONTRAST TECHNIQUE: Multidetector CT imaging of the cervical spine was performed without intravenous contrast. Multiplanar CT image reconstructions were also generated. COMPARISON:  02/02/2020 FINDINGS: Alignment: Loss of normal cervical lordosis is noted. Skull base and vertebrae: 7 cervical segments are well visualized. There is a type 2 fracture through the base of the odontoid identified. Minimal displacement of the superior fracture fragment is noted by approximately 3 mm. No significant posterior displacement is seen. Multilevel facet hypertrophic changes and osteophytic changes are seen throughout the cervical spine. No other fracture is noted. Multilevel neural foraminal narrowing is noted bilaterally. Soft tissues and spinal canal: Surrounding soft tissue structures appear within normal limits. Vascular calcifications are seen Upper chest: Visualized lung apices are within normal limits. Other: None IMPRESSION: Fracture through the base of the odontoid consistent with a type 2 odontoid fracture. Multilevel degenerative changes are noted. No other fractures are seen. Critical Value/emergent results were called by telephone at the time of interpretation on 12/03/2020 at 12:55 am to Dr. Pryor Curia , who verbally acknowledged these results. Electronically Signed   By: Inez Catalina M.D.   On: 12/03/2020 00:57      Subjective: Patient seen and examined on the day of discharge.  Mental status at baseline.  C-collar in place.  Discharge Exam: Vitals:   12/04/20 0817 12/04/20 1155  BP: 116/84 (!) 165/65  Pulse: (!) 59 64  Resp: 17 18  Temp: 97.8 F (36.6 C) 97.9 F (36.6 C)  SpO2: 97%    Vitals:   12/04/20 0432 12/04/20 0450 12/04/20 0817 12/04/20 1155  BP: (!) 157/84  116/84 (!) 165/65  Pulse: 68  (!) 59 64  Resp: 18  17 18   Temp: (!) 96.7 F (35.9 C) 97.7 F (36.5 C) 97.8 F (36.6 C) 97.9 F (36.6  C)  TempSrc: Axillary Axillary    SpO2: 96%  97%   Weight:      Height:        General: Pt is alert, awake, not in acute distress Cardiovascular: RRR, S1/S2 +, no rubs, no gallops Respiratory: CTA bilaterally, no wheezing, no rhonchi Abdominal: Soft, NT, ND, bowel sounds + Extremities: no edema, no cyanosis    The results of significant diagnostics from this hospitalization (including imaging, microbiology, ancillary and laboratory) are listed below for reference.     Microbiology: Recent Results (from the past 240 hour(s))  Resp Panel by RT-PCR (Flu A&B, Covid) Nasopharyngeal Swab     Status: None   Collection Time: 12/03/20  9:55 AM   Specimen: Nasopharyngeal Swab; Nasopharyngeal(NP) swabs in vial transport medium  Result Value Ref Range Status   SARS Coronavirus 2 by RT PCR NEGATIVE NEGATIVE Final    Comment: (NOTE) SARS-CoV-2 target  nucleic acids are NOT DETECTED.  The SARS-CoV-2 RNA is generally detectable in upper respiratory specimens during the acute phase of infection. The lowest concentration of SARS-CoV-2 viral copies this assay can detect is 138 copies/mL. A negative result does not preclude SARS-Cov-2 infection and should not be used as the sole basis for treatment or other patient management decisions. A negative result may occur with  improper specimen collection/handling, submission of specimen other than nasopharyngeal swab, presence of viral mutation(s) within the areas targeted by this assay, and inadequate number of viral copies(<138 copies/mL). A negative result must be combined with clinical observations, patient history, and epidemiological information. The expected result is Negative.  Fact Sheet for Patients:  EntrepreneurPulse.com.au  Fact Sheet for Healthcare Providers:  IncredibleEmployment.be  This test is no t yet approved or cleared by the [place] FDA and  has been authorized for detection and/or  diagnosis of SARS-CoV-2 by FDA under an Emergency Use Authorization (EUA). This EUA will remain  in effect (meaning this test can be used) for the duration of the COVID-19 declaration under Section 564(b)(1) of the Act, 21 U.S.C.section 360bbb-3(b)(1), unless the authorization is terminated  or revoked sooner.       Influenza A by PCR NEGATIVE NEGATIVE Final   Influenza B by PCR NEGATIVE NEGATIVE Final    Comment: (NOTE) The Xpert Xpress SARS-CoV-2/FLU/RSV plus assay is intended as an aid in the diagnosis of influenza from Nasopharyngeal swab specimens and should not be used as a sole basis for treatment. Nasal washings and aspirates are unacceptable for Xpert Xpress SARS-CoV-2/FLU/RSV testing.  Fact Sheet for Patients: EntrepreneurPulse.com.au  Fact Sheet for Healthcare Providers: IncredibleEmployment.be  This test is not yet approved or cleared by the [place] FDA and has been authorized for detection and/or diagnosis of SARS-CoV-2 by FDA under an Emergency Use Authorization (EUA). This EUA will remain in effect (meaning this test can be used) for the duration of the COVID-19 declaration under Section 564(b)(1) of the Act, 21 U.S.C. section 360bbb-3(b)(1), unless the authorization is terminated or revoked.  Performed at [facility], [address] [place], [place] [address]      Labs: BNP (last 3 results) Recent Labs    01/01/20 1813 12/03/20 0000  BNP 634.0* 937.9*   Basic Metabolic Panel: Recent Labs  Lab 12/03/20 0000 12/03/20 0143 12/04/20 0658  NA 141  --  138  K 3.2*  --  3.0*  CL 104  --  99  CO2 25  --  29  GLUCOSE 94  --  105*  BUN 47*  --  43*  CREATININE 2.31*  --  2.00*  CALCIUM 9.0  --  8.7*  MG  --  1.6* 1.9   Liver Function Tests: No results for input(s): AST, ALT, ALKPHOS, BILITOT, PROT, ALBUMIN in the last 168 hours. No results for input(s): LIPASE, AMYLASE in the last 168 hours. No  results for input(s): AMMONIA in the last 168 hours. CBC: Recent Labs  Lab 12/03/20 0000 12/04/20 0658  WBC 6.5 6.7  NEUTROABS 4.7  --   HGB 12.8* 12.6*  HCT 40.2 39.2  MCV 96.9 95.6  PLT 137* 137*   Cardiac Enzymes: No results for input(s): CKTOTAL, CKMB, CKMBINDEX, TROPONINI in the last 168 hours. BNP: Invalid input(s): POCBNP CBG: No results for input(s): GLUCAP in the last 168 hours. D-Dimer No results for input(s): DDIMER in the last 72 hours. Hgb A1c Recent Labs    12/03/20 1437  HGBA1C 6.0*  Lipid Profile Recent Labs    12/04/20 0658  CHOL 164  HDL 33*  LDLCALC 112*  TRIG 96  CHOLHDL 5.0   Thyroid function studies No results for input(s): TSH, T4TOTAL, T3FREE, THYROIDAB in the last 72 hours.  Invalid input(s): FREET3 Anemia work up No results for input(s): VITAMINB12, FOLATE, FERRITIN, TIBC, IRON, RETICCTPCT in the last 72 hours. Urinalysis    Component Value Date/Time   COLORURINE YELLOW (A) 12/03/2020 0143   APPEARANCEUR CLEAR (A) 12/03/2020 0143   LABSPEC 1.012 12/03/2020 0143   PHURINE 5.0 12/03/2020 0143   GLUCOSEU NEGATIVE 12/03/2020 0143   HGBUR NEGATIVE 12/03/2020 0143   HGBUR large 09/03/2008 0933   BILIRUBINUR NEGATIVE 12/03/2020 0143   BILIRUBINUR Negative 08/04/2017 1446   KETONESUR NEGATIVE 12/03/2020 0143   PROTEINUR 30 (A) 12/03/2020 0143   UROBILINOGEN 0.2 08/04/2017 1446   UROBILINOGEN 0.2 09/03/2008 0933   NITRITE NEGATIVE 12/03/2020 0143   LEUKOCYTESUR NEGATIVE 12/03/2020 0143   Sepsis Labs Invalid input(s): PROCALCITONIN,  WBC,  LACTICIDVEN Microbiology Recent Results (from the past 240 hour(s))  Resp Panel by RT-PCR (Flu A&B, Covid) Nasopharyngeal Swab     Status: None   Collection Time: 12/03/20  9:55 AM   Specimen: Nasopharyngeal Swab; Nasopharyngeal(NP) swabs in vial transport medium  Result Value Ref Range Status   SARS Coronavirus 2 by RT PCR NEGATIVE NEGATIVE Final    Comment: (NOTE) SARS-CoV-2 target nucleic  acids are NOT DETECTED.  The SARS-CoV-2 RNA is generally detectable in upper respiratory specimens during the acute phase of infection. The lowest concentration of SARS-CoV-2 viral copies this assay can detect is 138 copies/mL. A negative result does not preclude SARS-Cov-2 infection and should not be used as the sole basis for treatment or other patient management decisions. A negative result may occur with  improper specimen collection/handling, submission of specimen other than nasopharyngeal swab, presence of viral mutation(s) within the areas targeted by this assay, and inadequate number of viral copies(<138 copies/mL). A negative result must be combined with clinical observations, patient history, and epidemiological information. The expected result is Negative.  Fact Sheet for Patients:  EntrepreneurPulse.com.au  Fact Sheet for Healthcare Providers:  IncredibleEmployment.be  This test is no t yet approved or cleared by the [place] FDA and  has been authorized for detection and/or diagnosis of SARS-CoV-2 by FDA under an Emergency Use Authorization (EUA). This EUA will remain  in effect (meaning this test can be used) for the duration of the COVID-19 declaration under Section 564(b)(1) of the Act, 21 U.S.C.section 360bbb-3(b)(1), unless the authorization is terminated  or revoked sooner.       Influenza A by PCR NEGATIVE NEGATIVE Final   Influenza B by PCR NEGATIVE NEGATIVE Final    Comment: (NOTE) The Xpert Xpress SARS-CoV-2/FLU/RSV plus assay is intended as an aid in the diagnosis of influenza from Nasopharyngeal swab specimens and should not be used as a sole basis for treatment. Nasal washings and aspirates are unacceptable for Xpert Xpress SARS-CoV-2/FLU/RSV testing.  Fact Sheet for Patients: EntrepreneurPulse.com.au  Fact Sheet for Healthcare Providers: IncredibleEmployment.be  This  test is not yet approved or cleared by the [place] FDA and has been authorized for detection and/or diagnosis of SARS-CoV-2 by FDA under an Emergency Use Authorization (EUA). This EUA will remain in effect (meaning this test can be used) for the duration of the COVID-19 declaration under Section 564(b)(1) of the Act, 21 U.S.C. section 360bbb-3(b)(1), unless the authorization is terminated or revoked.  Performed at [facility], [address] [place], [place] [address]      Time coordinating discharge: Over 30 minutes  SIGNED:   Sidney Ace, MD  [facility] 12/04/2020, 2:55 PM Pager   If 7PM-7AM, please contact night-coverage

## 2020-12-04 NOTE — Progress Notes (Signed)
Pt was admitted on the floor with no sign of distress. Pt alert to self, place, situation but disoriented to time. VSS. Pt was educated about safety and ascom within pt reach. Unable to complete screening assessment due to confusion at this time. Will notify incoming shift, Will continue to monitor.

## 2020-12-04 NOTE — Progress Notes (Signed)
Progress Note  Patient Name: Stephen Garrett Date of Encounter: 12/04/2020  Primary Cardiologist: Agbor-Etang  Subjective   No chest pain or dyspnea.   Inpatient Medications    Scheduled Meds:  apixaban  2.5 mg Oral BID   buPROPion  150 mg Oral QODAY   clonazePAM  0.5 mg Oral QHS   finasteride  5 mg Oral Daily   furosemide  80 mg Oral Daily   hydrALAZINE  25 mg Oral Q6H   multivitamin with minerals  1 tablet Oral Daily   pantoprazole  40 mg Oral Daily   pramipexole  0.5 mg Oral Q supper   pramipexole  1 mg Oral QHS   pyridoxine  100 mg Oral Daily   tamsulosin  0.4 mg Oral BID   Continuous Infusions:  PRN Meds: acetaminophen, hydrALAZINE, nitroGLYCERIN, ondansetron (ZOFRAN) IV, oxyCODONE-acetaminophen, traZODone   Vital Signs    Vitals:   12/03/20 2030 12/04/20 0009 12/04/20 0432 12/04/20 0450  BP:  (!) 180/99 (!) 157/84   Pulse:   68   Resp:  18 18   Temp: 97.6 F (36.4 C) 98.7 F (37.1 C) (!) 96.7 F (35.9 C) 97.7 F (36.5 C)  TempSrc: Oral Axillary Axillary Axillary  SpO2:  97% 96%   Weight:      Height:        Intake/Output Summary (Last 24 hours) at 12/04/2020 0716 Last data filed at 12/04/2020 0500 Gross per 24 hour  Intake --  Output 600 ml  Net -600 ml   Filed Weights   12/02/20 2352  Weight: 77.7 kg    Telemetry    Afib with slow ventricular response, 40s to 50s bpm - Personally Reviewed  ECG    No new tracings - Personally Reviewed  Physical Exam   GEN: No acute distress.   Neck: Unable to assess for JVD given cervical collar in place. Cardiac: IRIR, no murmurs, rubs, or gallops.  Respiratory: Clear to auscultation bilaterally.  GI: Soft, nontender, non-distended.   MS: 1+ lower extremity woody edema; No deformity. Neuro:  Somnolent; Nonfocal.  Psych: Somnolent.  Labs    Chemistry Recent Labs  Lab 12/03/20 0000  NA 141  K 3.2*  CL 104  CO2 25  GLUCOSE 94  BUN 47*  CREATININE 2.31*  CALCIUM 9.0  GFRNONAA 25*   ANIONGAP 12     Hematology Recent Labs  Lab 12/03/20 0000  WBC 6.5  RBC 4.15*  HGB 12.8*  HCT 40.2  MCV 96.9  MCH 30.8  MCHC 31.8  RDW 16.3*  PLT 137*    Cardiac EnzymesNo results for input(s): TROPONINI in the last 168 hours. No results for input(s): TROPIPOC in the last 168 hours.   BNP Recent Labs  Lab 12/03/20 0000  BNP 532.1*     DDimer No results for input(s): DDIMER in the last 168 hours.   Radiology    DG Chest 2 View  Result Date: 12/03/2020 IMPRESSION: Stable cardiomegaly with a small left pleural effusion. Electronically Signed   By: Virgina Norfolk M.D.   On: 12/03/2020 00:36   CT HEAD WO CONTRAST (5MM)  Addendum Date: 12/03/2020   ADDENDUM REPORT: 12/03/2020 00:59 ADDENDUM: Critical Value/emergent results were called by telephone at the time of interpretation on 12/03/2020 at 12:55 am to Dr. Pryor Curia , who verbally acknowledged these results. Electronically Signed   By: Inez Catalina M.D.   On: 12/03/2020 00:59   Result Date: 12/03/2020 IMPRESSION: Chronic atrophic and ischemic changes  without acute intracranial abnormality. Electronically Signed: By: Inez Catalina M.D. On: 12/03/2020 00:51   CT Cervical Spine Wo Contrast  Result Date: 12/03/2020 IMPRESSION: Fracture through the base of the odontoid consistent with a type 2 odontoid fracture. Multilevel degenerative changes are noted. No other fractures are seen. Critical Value/emergent results were called by telephone at the time of interpretation on 12/03/2020 at 12:55 am to Dr. Pryor Curia , who verbally acknowledged these results. Electronically Signed   By: Inez Catalina M.D.   On: 12/03/2020 00:57    Cardiac Studies   2D echo 02/03/2020: 1. Left ventricular ejection fraction, by estimation, is 60 to 65%. The  left ventricle has normal function. The left ventricle has no regional  wall motion abnormalities. There is severe left ventricular hypertrophy.  Left ventricular diastolic function   could not be evaluated.   2. Right ventricular systolic function is normal. The right ventricular  size is normal. Tricuspid regurgitation signal is inadequate for assessing  PA pressure.   3. Left atrial size was severely dilated.   4. Right atrial size was severely dilated.   5. The mitral valve is abnormal. Mild mitral valve regurgitation. No  evidence of mitral stenosis. Moderate mitral annular calcification.   6. The aortic valve is normal in structure. Aortic valve regurgitation is  not visualized. Mild aortic valve stenosis.   Patient Profile     87 y.o. male with history of permanent Afib on Eliquis, frequent falls, HTN, HLD, CKD stage IV, LVH, lymphedema/chronic lower extremity edema, BPH, GERD, restless leg syndrome, and hearing difficulty, who is being seen today for the evaluation of elevated high sensitivity troponin and lower extremity edema at the request of Dr. Blaine Hamper.  Assessment & Plan    1. Elevated high sensitivity troponin: -No chest pain -Minimally elevated and flat trending, not consistent with ACS -No indication for heparin gtt -Chronic issue of mildly elevated troponin levels during [facility] admissions with values typically between 200-400 -Likely supply demand ischemia with overall poor prognosis -Prior echo with preserved LV systolic function and normal wall motion -Not a candidate for invasive ischemic evaluation given advanced age, dementia, CKD and stage IV -Continue conservative management -Consider palliative care consult -No ASA in the context of Eliquis -No beta blocker given underlying bradycardia -Not on a statin secondary to intolerance   2. NSVT/PVCs: -Asymptomatic  -No further episodes -Recommend repletion of potassium and magnesium to goal 4.0 and 2.0, respectively -Not a good candidate for beta blocker given frequent bradycardia and prior R-R interval noted of up to 2.47 seconds  3. Permanent Afib: -Ventricular rates stable with known slow  ventricular response -Not on beta blcoker as above -CHADS2VASc at least 5 -Eliquis 2.5 mg bid (age/SCr) -If falls persist, may need to consider stopping [place] going forward, this can be discussed with his primary cardiologist in the office  4. Chronic lower extremity lymphedema: -Unlikely CHF -Recommend ACE wraps, not likely to be a good candidate for compression socks as this may be traumatic for him  5. Hypokalemia/hypomagnesemia: -Replete as above  6. HTN: -BP remains mildly elevated -Not many options, cannot use ACEi/ARN given CKD, no beta blocker given bradycardia, calcium channel blockers not a good option give lower extremity edema -Hydralazine  -Could add Imdur  For questions or updates, please contact [facility] Please consult [email or web address] for contact info under Cardiology/STEMI.    Signed, Christell Faith, PA-C [place] Pager: [phone] 12/04/2020, 7:16 AM

## 2020-12-07 DIAGNOSIS — R748 Abnormal levels of other serum enzymes: Secondary | ICD-10-CM | POA: Diagnosis not present

## 2020-12-07 DIAGNOSIS — I4891 Unspecified atrial fibrillation: Secondary | ICD-10-CM | POA: Diagnosis not present

## 2020-12-07 DIAGNOSIS — R296 Repeated falls: Secondary | ICD-10-CM | POA: Diagnosis not present

## 2020-12-09 ENCOUNTER — Encounter: Payer: Medicare Other | Admitting: Internal Medicine

## 2020-12-11 DIAGNOSIS — I5032 Chronic diastolic (congestive) heart failure: Secondary | ICD-10-CM | POA: Diagnosis not present

## 2020-12-11 DIAGNOSIS — R278 Other lack of coordination: Secondary | ICD-10-CM | POA: Diagnosis not present

## 2020-12-11 DIAGNOSIS — Z8673 Personal history of transient ischemic attack (TIA), and cerebral infarction without residual deficits: Secondary | ICD-10-CM | POA: Diagnosis not present

## 2020-12-11 DIAGNOSIS — I10 Essential (primary) hypertension: Secondary | ICD-10-CM | POA: Diagnosis not present

## 2020-12-11 DIAGNOSIS — R4189 Other symptoms and signs involving cognitive functions and awareness: Secondary | ICD-10-CM | POA: Diagnosis not present

## 2020-12-11 DIAGNOSIS — G2581 Restless legs syndrome: Secondary | ICD-10-CM | POA: Diagnosis not present

## 2020-12-11 DIAGNOSIS — Z741 Need for assistance with personal care: Secondary | ICD-10-CM | POA: Diagnosis not present

## 2020-12-11 DIAGNOSIS — E860 Dehydration: Secondary | ICD-10-CM | POA: Diagnosis not present

## 2020-12-12 DIAGNOSIS — E860 Dehydration: Secondary | ICD-10-CM | POA: Diagnosis not present

## 2020-12-12 DIAGNOSIS — G2581 Restless legs syndrome: Secondary | ICD-10-CM | POA: Diagnosis not present

## 2020-12-12 DIAGNOSIS — R278 Other lack of coordination: Secondary | ICD-10-CM | POA: Diagnosis not present

## 2020-12-12 DIAGNOSIS — I10 Essential (primary) hypertension: Secondary | ICD-10-CM | POA: Diagnosis not present

## 2020-12-12 DIAGNOSIS — Z8673 Personal history of transient ischemic attack (TIA), and cerebral infarction without residual deficits: Secondary | ICD-10-CM | POA: Diagnosis not present

## 2020-12-12 DIAGNOSIS — I5032 Chronic diastolic (congestive) heart failure: Secondary | ICD-10-CM | POA: Diagnosis not present

## 2020-12-14 DIAGNOSIS — R278 Other lack of coordination: Secondary | ICD-10-CM | POA: Diagnosis not present

## 2020-12-14 DIAGNOSIS — G2581 Restless legs syndrome: Secondary | ICD-10-CM | POA: Diagnosis not present

## 2020-12-14 DIAGNOSIS — I10 Essential (primary) hypertension: Secondary | ICD-10-CM | POA: Diagnosis not present

## 2020-12-14 DIAGNOSIS — R0989 Other specified symptoms and signs involving the circulatory and respiratory systems: Secondary | ICD-10-CM | POA: Diagnosis not present

## 2020-12-14 DIAGNOSIS — I5032 Chronic diastolic (congestive) heart failure: Secondary | ICD-10-CM | POA: Diagnosis not present

## 2020-12-14 DIAGNOSIS — R059 Cough, unspecified: Secondary | ICD-10-CM | POA: Diagnosis not present

## 2020-12-14 DIAGNOSIS — Z8673 Personal history of transient ischemic attack (TIA), and cerebral infarction without residual deficits: Secondary | ICD-10-CM | POA: Diagnosis not present

## 2020-12-14 DIAGNOSIS — E860 Dehydration: Secondary | ICD-10-CM | POA: Diagnosis not present

## 2020-12-15 ENCOUNTER — Encounter: Payer: Medicare Other | Admitting: Internal Medicine

## 2020-12-15 DIAGNOSIS — I10 Essential (primary) hypertension: Secondary | ICD-10-CM | POA: Diagnosis not present

## 2020-12-15 DIAGNOSIS — I5032 Chronic diastolic (congestive) heart failure: Secondary | ICD-10-CM | POA: Diagnosis not present

## 2020-12-15 DIAGNOSIS — G2581 Restless legs syndrome: Secondary | ICD-10-CM | POA: Diagnosis not present

## 2020-12-15 DIAGNOSIS — R278 Other lack of coordination: Secondary | ICD-10-CM | POA: Diagnosis not present

## 2020-12-15 DIAGNOSIS — E860 Dehydration: Secondary | ICD-10-CM | POA: Diagnosis not present

## 2020-12-15 DIAGNOSIS — Z8673 Personal history of transient ischemic attack (TIA), and cerebral infarction without residual deficits: Secondary | ICD-10-CM | POA: Diagnosis not present

## 2020-12-16 DIAGNOSIS — Z8673 Personal history of transient ischemic attack (TIA), and cerebral infarction without residual deficits: Secondary | ICD-10-CM | POA: Diagnosis not present

## 2020-12-16 DIAGNOSIS — I10 Essential (primary) hypertension: Secondary | ICD-10-CM | POA: Diagnosis not present

## 2020-12-16 DIAGNOSIS — R278 Other lack of coordination: Secondary | ICD-10-CM | POA: Diagnosis not present

## 2020-12-16 DIAGNOSIS — G2581 Restless legs syndrome: Secondary | ICD-10-CM | POA: Diagnosis not present

## 2020-12-16 DIAGNOSIS — I5032 Chronic diastolic (congestive) heart failure: Secondary | ICD-10-CM | POA: Diagnosis not present

## 2020-12-16 DIAGNOSIS — E860 Dehydration: Secondary | ICD-10-CM | POA: Diagnosis not present

## 2020-12-17 ENCOUNTER — Emergency Department
Admission: EM | Admit: 2020-12-17 | Discharge: 2020-12-18 | Disposition: A | Discharge status: Other Acute Inpatient Hospital | Payer: Medicare Other | Attending: Emergency Medicine | Admitting: Emergency Medicine

## 2020-12-17 ENCOUNTER — Other Ambulatory Visit: Payer: Self-pay

## 2020-12-17 ENCOUNTER — Encounter: Payer: Self-pay | Admitting: *Deleted

## 2020-12-17 DIAGNOSIS — I5032 Chronic diastolic (congestive) heart failure: Secondary | ICD-10-CM | POA: Diagnosis present

## 2020-12-17 DIAGNOSIS — E860 Dehydration: Secondary | ICD-10-CM | POA: Diagnosis not present

## 2020-12-17 DIAGNOSIS — F418 Other specified anxiety disorders: Secondary | ICD-10-CM | POA: Diagnosis present

## 2020-12-17 DIAGNOSIS — N184 Chronic kidney disease, stage 4 (severe): Secondary | ICD-10-CM | POA: Diagnosis not present

## 2020-12-17 DIAGNOSIS — F32A Depression, unspecified: Secondary | ICD-10-CM | POA: Diagnosis not present

## 2020-12-17 DIAGNOSIS — R600 Localized edema: Secondary | ICD-10-CM | POA: Diagnosis present

## 2020-12-17 DIAGNOSIS — N138 Other obstructive and reflux uropathy: Secondary | ICD-10-CM | POA: Diagnosis present

## 2020-12-17 DIAGNOSIS — G459 Transient cerebral ischemic attack, unspecified: Secondary | ICD-10-CM | POA: Diagnosis present

## 2020-12-17 DIAGNOSIS — Z87891 Personal history of nicotine dependence: Secondary | ICD-10-CM | POA: Diagnosis not present

## 2020-12-17 DIAGNOSIS — Z7901 Long term (current) use of anticoagulants: Secondary | ICD-10-CM | POA: Insufficient documentation

## 2020-12-17 DIAGNOSIS — Z8673 Personal history of transient ischemic attack (TIA), and cerebral infarction without residual deficits: Secondary | ICD-10-CM | POA: Diagnosis not present

## 2020-12-17 DIAGNOSIS — I10 Essential (primary) hypertension: Secondary | ICD-10-CM | POA: Diagnosis not present

## 2020-12-17 DIAGNOSIS — Z046 Encounter for general psychiatric examination, requested by authority: Secondary | ICD-10-CM | POA: Diagnosis present

## 2020-12-17 DIAGNOSIS — I130 Hypertensive heart and chronic kidney disease with heart failure and stage 1 through stage 4 chronic kidney disease, or unspecified chronic kidney disease: Secondary | ICD-10-CM | POA: Insufficient documentation

## 2020-12-17 DIAGNOSIS — W19XXXA Unspecified fall, initial encounter: Secondary | ICD-10-CM | POA: Diagnosis present

## 2020-12-17 DIAGNOSIS — R531 Weakness: Secondary | ICD-10-CM

## 2020-12-17 DIAGNOSIS — F331 Major depressive disorder, recurrent, moderate: Secondary | ICD-10-CM | POA: Diagnosis not present

## 2020-12-17 DIAGNOSIS — G2581 Restless legs syndrome: Secondary | ICD-10-CM | POA: Diagnosis not present

## 2020-12-17 DIAGNOSIS — G4700 Insomnia, unspecified: Secondary | ICD-10-CM | POA: Diagnosis present

## 2020-12-17 DIAGNOSIS — M48061 Spinal stenosis, lumbar region without neurogenic claudication: Secondary | ICD-10-CM | POA: Diagnosis present

## 2020-12-17 DIAGNOSIS — R45851 Suicidal ideations: Secondary | ICD-10-CM | POA: Insufficient documentation

## 2020-12-17 DIAGNOSIS — Z20822 Contact with and (suspected) exposure to covid-19: Secondary | ICD-10-CM | POA: Insufficient documentation

## 2020-12-17 DIAGNOSIS — E876 Hypokalemia: Secondary | ICD-10-CM

## 2020-12-17 DIAGNOSIS — Z7189 Other specified counseling: Secondary | ICD-10-CM

## 2020-12-17 DIAGNOSIS — S12110A Anterior displaced Type II dens fracture, initial encounter for closed fracture: Secondary | ICD-10-CM | POA: Diagnosis not present

## 2020-12-17 DIAGNOSIS — I4891 Unspecified atrial fibrillation: Secondary | ICD-10-CM | POA: Diagnosis present

## 2020-12-17 DIAGNOSIS — F0150 Vascular dementia without behavioral disturbance: Secondary | ICD-10-CM | POA: Insufficient documentation

## 2020-12-17 DIAGNOSIS — Z96653 Presence of artificial knee joint, bilateral: Secondary | ICD-10-CM | POA: Insufficient documentation

## 2020-12-17 DIAGNOSIS — F39 Unspecified mood [affective] disorder: Secondary | ICD-10-CM | POA: Diagnosis present

## 2020-12-17 DIAGNOSIS — R278 Other lack of coordination: Secondary | ICD-10-CM | POA: Diagnosis not present

## 2020-12-17 DIAGNOSIS — Z8781 Personal history of (healed) traumatic fracture: Secondary | ICD-10-CM | POA: Diagnosis not present

## 2020-12-17 LAB — CBC WITH DIFFERENTIAL/PLATELET
Abs Immature Granulocytes: 0.03 10*3/uL (ref 0.00–0.07)
Basophils Absolute: 0 10*3/uL (ref 0.0–0.1)
Basophils Relative: 0 %
Eosinophils Absolute: 0.1 10*3/uL (ref 0.0–0.5)
Eosinophils Relative: 1 %
HCT: 34.2 % — ABNORMAL LOW (ref 39.0–52.0)
Hemoglobin: 11.5 g/dL — ABNORMAL LOW (ref 13.0–17.0)
Immature Granulocytes: 1 %
Lymphocytes Relative: 26 %
Lymphs Abs: 1.4 10*3/uL (ref 0.7–4.0)
MCH: 31.5 pg (ref 26.0–34.0)
MCHC: 33.6 g/dL (ref 30.0–36.0)
MCV: 93.7 fL (ref 80.0–100.0)
Monocytes Absolute: 0.3 10*3/uL (ref 0.1–1.0)
Monocytes Relative: 6 %
Neutro Abs: 3.6 10*3/uL (ref 1.7–7.7)
Neutrophils Relative %: 66 %
Platelets: 164 10*3/uL (ref 150–400)
RBC: 3.65 MIL/uL — ABNORMAL LOW (ref 4.22–5.81)
RDW: 15.4 % (ref 11.5–15.5)
WBC: 5.4 10*3/uL (ref 4.0–10.5)
nRBC: 0 % (ref 0.0–0.2)

## 2020-12-17 LAB — BASIC METABOLIC PANEL
Anion gap: 9 (ref 5–15)
BUN: 44 mg/dL — ABNORMAL HIGH (ref 8–23)
CO2: 29 mmol/L (ref 22–32)
Calcium: 9 mg/dL (ref 8.9–10.3)
Chloride: 100 mmol/L (ref 98–111)
Creatinine, Ser: 2.13 mg/dL — ABNORMAL HIGH (ref 0.61–1.24)
GFR, Estimated: 28 mL/min — ABNORMAL LOW (ref >60)
Glucose, Bld: 102 mg/dL — ABNORMAL HIGH (ref 70–99)
Potassium: 2.6 mmol/L — CL (ref 3.5–5.1)
Sodium: 138 mmol/L (ref 135–145)

## 2020-12-17 MED ORDER — POTASSIUM CHLORIDE CRYS ER 20 MEQ PO TBCR
80.0000 meq | EXTENDED_RELEASE_TABLET | Freq: Once | ORAL | Status: DC
  Filled 2020-12-17: qty 4

## 2020-12-17 MED ORDER — POTASSIUM CHLORIDE CRYS ER 20 MEQ PO TBCR: 40.0000 meq | EXTENDED_RELEASE_TABLET | Freq: Once | ORAL | Status: DC

## 2020-12-17 NOTE — ED Notes (Addendum)
Khaki pants Belt brown [employer] watch

## 2020-12-17 NOTE — ED Notes (Signed)
Dr. Archie Balboa states not to obtain any blood work or specimens.

## 2020-12-17 NOTE — ED Provider Notes (Signed)
Emergency Medicine Observation Re-evaluation Note  Stephen Garrett is a 87 y.o. male, seen on rounds today.  Pt initially presented to the ED for complaints of Psychiatric Evaluation Currently, the patient is resting, requesting a snack.  Physical Exam  BP (!) 154/84 (BP Location: Right Arm)   Pulse 74   Temp 98.5 F (36.9 C) (Oral)   Resp 20   Ht 5\' 7"  (1.702 m)   Wt 77.6 kg   SpO2 95%   BMI 26.78 kg/m  Physical Exam General: no acute distress Lungs: equal chest rise Psych: calm  ED Course / MDM  EKG:   I have reviewed the labs performed to date as well as medications administered while in observation.  Recent changes in the last 24 hours include none.  Plan  Current plan is for inpatient. Stephen Garrett is under involuntary commitment.      Lucrezia Starch, MD 12/18/20 [phone]

## 2020-12-17 NOTE — BH Assessment (Addendum)
Comprehensive Clinical Assessment (CCA) Note  12/18/2020 Stephen Garrett [id number] Recommendations for Services/Supports/Treatments: Consulted with Lynder Parents., NP, who determined pt. meets inpatient criteria. Notified Dr. Archie Balboa and Gerald Stabs, RN of disposition recommendation.   Stephen Garrett is a 87 year old, English speaking, white male with a hx of anxiety and depression. Pt presented to [facility] ED under IVC due to thoughts of SI. Per IVC, the pt has googled how to not wake up after you go to sleep. Pt presented with, "I wanna die, simple as that." The patient was adamant that he no longer wishes to live. The pt. perseverated about having lived long enough. The pt had impaired insight and poor judgement. Pt did not appear to be responding to internal or external stimuli. Pt was oriented x4. Pt was hard of hearing and had loud speech. Pt's psychomotor activity and outward appearance were unremarkable. Pt presented with an irritable mood; affect was anxious. Pt denied current HI/AV/H.   Chief Complaint:  Chief Complaint  Patient presents with   Psychiatric Evaluation   Visit Diagnosis: MDD, recurrent, moderate    CCA Screening, Triage and Referral (STR)  Patient Reported Information How did you hear about [place]? Other (Comment) Brewing technologist)  Referral name: No data recorded Referral phone number: No data recorded  Whom do you see for routine medical problems? No data recorded Practice/Facility Name: No data recorded Practice/Facility Phone Number: No data recorded Name of Contact: No data recorded Contact Number: No data recorded Contact Fax Number: No data recorded Prescriber Name: No data recorded Prescriber Address (if known): No data recorded  What Is the Reason for Your Visit/Call Today? SI; Per IVC pt states, "I was just trying to go to sleep and not wake up".  How Long Has This Been Causing You Problems? <Week  What Do You Feel Would Help You the Most Today? Treatment  for Depression or other mood problem   Have You Recently Been in Any Inpatient Treatment (Hospital/Detox/Crisis Center/28-Day Program)? No data recorded Name/Location of Program/Hospital:No data recorded How Long Were You There? No data recorded When Were You Discharged? No data recorded  Have You Ever Received Services From [facility] Before? No data recorded Who Do You See at [facility]? No data recorded  Have You Recently Had Any Thoughts About Hurting Yourself? Yes  Are You Planning to Commit Suicide/Harm Yourself At This time? Yes   Have you Recently Had Thoughts About Hurting Someone [name]? No  Explanation: No data recorded  Have You Used Any Alcohol or Drugs in the Past 24 Hours? No  How Long Ago Did You Use Drugs or Alcohol? No data recorded What Did You Use and How Much? No data recorded  Do You Currently Have a Therapist/Psychiatrist? No  Name of Therapist/Psychiatrist: No data recorded  Have You Been Recently Discharged From Any Office Practice or Programs? No  Explanation of Discharge From Practice/Program: No data recorded    CCA Screening Triage Referral Assessment Type of Contact: Face-to-Face  Is this Initial or Reassessment? No data recorded Date Telepsych consult ordered in CHL:  No data recorded Time Telepsych consult ordered in CHL:  No data recorded  Patient Reported Information Reviewed? No data recorded Patient Left Without Being Seen? No data recorded Reason for Not Completing Assessment: No data recorded  Collateral Involvement: None provided   Does Patient Have a [place]? No data recorded Name and Contact of Legal Guardian: No data recorded If Minor and Not Living with Parent(s), Who  has Custody? No data recorded Is CPS involved or ever been involved? Never  Is APS involved or ever been involved? Never   Patient Determined To Be At Risk for Harm To Self or Others Based on Review of Patient Reported Information  or Presenting Complaint? Yes, for Self-Harm  Method: No data recorded Availability of Means: No data recorded Intent: No data recorded Notification Required: No data recorded Additional Information for Danger to Others Potential: No data recorded Additional Comments for Danger to Others Potential: No data recorded Are There Guns or Other Weapons in Your Home? No data recorded Types of Guns/Weapons: No data recorded Are These Weapons Safely Secured?                            No data recorded Who Could Verify You Are Able To Have These Secured: No data recorded Do You Have any Outstanding Charges, Pending Court Dates, Parole/Probation? No data recorded Contacted To Inform of Risk of Harm To Self or Others: No data recorded  Location of Assessment: [facility] ED   Does Patient Present under Involuntary Commitment? Yes  IVC Papers Initial File Date: 12/17/20   [place] of Residence: Other (Comment)   Patient Currently Receiving the Following Services: Not Receiving Services   Determination of Need: Emergent (2 hours)   Options For Referral: Inpatient Hospitalization     CCA Biopsychosocial Intake/Chief Complaint:  No data recorded Current Symptoms/Problems: No data recorded  Patient Reported Schizophrenia/Schizoaffective Diagnosis in Past: No   Strengths: Pt is able to articulate needs  Preferences: No data recorded Abilities: No data recorded  Type of Services Patient Feels are Needed: No data recorded  Initial Clinical Notes/Concerns: No data recorded  Mental Health Symptoms Depression:   Hopelessness; Worthlessness   Duration of Depressive symptoms:  Greater than two weeks   Mania:   None   Anxiety:    Tension   Psychosis:   None   Duration of Psychotic symptoms: No data recorded  Trauma:   N/A   Obsessions:   Disrupts routine/functioning; Cause anxiety; Recurrent & persistent thoughts/impulses/images   Compulsions:   None   Inattention:    None   Hyperactivity/Impulsivity:   None   Oppositional/Defiant Behaviors:   None   Emotional Irregularity:   N/A   Other Mood/Personality Symptoms:  No data recorded   Mental Status Exam Appearance and self-care  Stature:   Average   Weight:   Average weight   Clothing:   Age-appropriate   Grooming:   Normal   Cosmetic use:   None   Posture/gait:   Normal   Motor activity:   Not Remarkable   Sensorium  Attention:   Normal   Concentration:   Normal   Orientation:   Situation; Place; Person; Object   Recall/memory:   -- (UTA)   Affect and Mood  Affect:   Anxious   Mood:   Irritable   Relating  Eye contact:   Staring   Facial expression:   Responsive   Attitude toward examiner:   Irritable   Thought and Language  Speech flow:  Loud   Thought content:   Appropriate to Mood and Circumstances   Preoccupation:   Obsessions   Hallucinations:   None   Organization:  No data recorded  [employer] of Knowledge:   Average   Intelligence:   Average   Abstraction:   Concrete   Judgement:   Poor  Reality Testing:   Adequate   Insight:   Poor   Decision Making:   Vacilates   Social Functioning  Social Maturity:   Responsible   Social Judgement:   Heedless   Stress  Stressors:   Illness   Coping Ability:   Programme researcher, broadcasting/film/video Deficits:   Activities of daily living   Supports:   Support needed; Friends/Service system     Religion: Religion/Spirituality Are You A Religious Person?:  (UTA) How Might This Affect Treatment?:  (UTA)  Leisure/Recreation: Leisure / Recreation Do You Have Hobbies?:  (UTA)  Exercise/Diet: Exercise/Diet Do You Exercise?: No Have You Gained or Lost A Significant Amount of Weight in the Past Six Months?: No Do You Follow a Special Diet?: No Do You Have Any Trouble Sleeping?:  (UTA)   CCA Employment/Education Employment/Work Situation: Employment / Work  Situation Employment Situation: Retired Social research officer, government has Been Impacted by Current Illness: No Has Patient ever Been in 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)  Education: Education Is Patient Currently Attending School?: No Last Grade Completed:  (UTA) Did You Attend College?:  (UTA) Did You Have An Individualized Education Program (IIEP):  (UTA) Did You Have Any Difficulty At School?:  (UTA) Patient's Education Has Been Impacted by Current Illness:  (UTA)   CCA Family/Childhood History Family and Relationship History: Family history Marital status: Widowed Widowed, when?:  Special educational needs teacher) Does patient have children?:  (UTA)  Childhood History:  Childhood History By whom was/is the patient raised?:  (UTA) Did patient suffer any verbal/emotional/physical/sexual abuse as a child?:  (UTA) Did patient suffer from severe childhood neglect?:  (UTA) Has patient ever been sexually abused/assaulted/raped as an adolescent or adult?:  (UTA) Was the patient ever a victim of a crime or a disaster?:  (UTA) Witnessed domestic violence?:  (UTA) Has patient been affected by domestic violence as an adult?:  Special educational needs teacher)  Child/Adolescent Assessment:     CCA Substance Use Alcohol/Drug Use: Alcohol / Drug Use Pain Medications: See MAR Prescriptions: See MAR Over the Counter: See MAR History of alcohol / drug use?: No history of alcohol / drug abuse                         ASAM's:  Six Dimensions of Multidimensional Assessment  Dimension 1:  Acute Intoxication and/or Withdrawal Potential:      Dimension 2:  Biomedical Conditions and Complications:      Dimension 3:  Emotional, Behavioral, or Cognitive Conditions and Complications:     Dimension 4:  Readiness to Change:     Dimension 5:  Relapse, Continued use, or Continued Problem Potential:     Dimension 6:  Recovery/Living Environment:     ASAM Severity Score:    ASAM Recommended Level of Treatment:     Substance use Disorder (SUD)     Recommendations for Services/Supports/Treatments:    DSM5 Diagnoses: Patient Active Problem List   Diagnosis Date Noted   Elevated troponin 12/03/2020   HTN (hypertension)    HLD (hyperlipidemia)    Fall    Odontoid fracture ([place])    Ventricular tachycardia    Depression with anxiety    Insomnia 11/26/2020   Right leg pain 09/27/2020   Bilateral lower extremity edema 09/27/2020   Anemia 09/27/2020   Vascular dementia without behavioral disturbance ([place]) 08/13/2020   Stasis ulcer ([place]) 08/13/2020   Chronic diastolic heart failure ([place]) 01/14/2020   Hypokalemia 01/01/2020   A-fib ([place]) 01/01/2020   Diplopia 09/25/2017  TIA (transient ischemic attack) 09/14/2017   Generalized weakness 08/04/2017   Retinal vein occlusion, central 02/19/2014   Chronic venous insufficiency 02/19/2014   Episodic mood disorder ([place]) 12/20/2013   Advanced directives, counseling/discussion 12/20/2013   Chronic kidney disease, stage IV (severe) ([place]) 01/26/2009   Restless legs syndrome 10/03/2008   Osteoarthritis 12/12/2006   SPINAL STENOSIS, LUMBAR 06/24/2006   Essential hypertension, benign 06/21/2006   GERD 06/21/2006   BPH with obstruction/lower urinary tract symptoms 06/21/2006    Shalie Schremp R [place], LCAS

## 2020-12-17 NOTE — ED Triage Notes (Signed)
Pt brought in via [facility].  Pt is IVC from [facility].  Pt reports SI for 2 days.  No drugs or etoh use  pt calm and cooperative.

## 2020-12-17 NOTE — ED Provider Notes (Signed)
[facility] Emergency Department Provider Note   ____________________________________________   I have reviewed the triage vital signs and the nursing notes.   HISTORY  Chief Complaint Psychiatric Evaluation   History limited by: Not Limited   HPI Stephen Garrett is a 87 y.o. male who presents to the emergency department today because of concern for suicidal ideation. Patient presented under IVC. The patient says that he was trying to go to sleep tonight and not wake up. Says he has lived long enough.     Records reviewed. Per medical record review patient has a history of vascular dementia. Recent history of neck fracture, followed up with neurosurgery today.   Past Medical History:  Diagnosis Date   Actinic keratosis    BPH (benign prostatic hypertrophy)    CKD (chronic kidney disease), stage IV (HCC)    Diseases of lips    Diverticulosis of colon 06/2003   ED (erectile dysfunction)    Frequent falls    GERD (gastroesophageal reflux disease)    Heart murmur    as child   HLD (hyperlipidemia)    HOH (hard of hearing)    Cochlear implant right, hearing aid left   HTN (hypertension)    LVH (left ventricular hypertrophy)    a. 01/2020 Echo: EF 60-65%, no rwma, sev LVH. Nl RV size/fxn. Sev BAE. Mild MR.   Lymphedema    Numbness and tingling in both hands    intermittent, trying braces    Osteoarthritis of knee    severe, bilat   Permanent atrial fibrillation (HCC)    Restless legs syndrome (RLS)    Sleep disturbance, unspecified    Spinal stenosis, lumbar region, without neurogenic claudication     Patient Active Problem List   Diagnosis Date Noted   Elevated troponin 12/03/2020   HTN (hypertension)    HLD (hyperlipidemia)    Fall    Odontoid fracture ([place])    Ventricular tachycardia    Depression with anxiety    Insomnia 11/26/2020   Right leg pain 09/27/2020   Bilateral lower extremity edema 09/27/2020   Anemia 09/27/2020    Vascular dementia without behavioral disturbance ([place]) 08/13/2020   Stasis ulcer ([place]) 08/13/2020   Chronic diastolic heart failure ([place]) 01/14/2020   Hypokalemia 01/01/2020   A-fib ([place]) 01/01/2020   Diplopia 09/25/2017   TIA (transient ischemic attack) 09/14/2017   Generalized weakness 08/04/2017   Retinal vein occlusion, central 02/19/2014   Chronic venous insufficiency 02/19/2014   Episodic mood disorder ([place]) 12/20/2013   Advanced directives, counseling/discussion 12/20/2013   Chronic kidney disease, stage IV (severe) ([place]) 01/26/2009   Restless legs syndrome 10/03/2008   Osteoarthritis 12/12/2006   SPINAL STENOSIS, LUMBAR 06/24/2006   Essential hypertension, benign 06/21/2006   GERD 06/21/2006   BPH with obstruction/lower urinary tract symptoms 06/21/2006    Past Surgical History:  Procedure Laterality Date   CATARACT EXTRACTION, BILATERAL  2011   Dr. Thomasene Ripple   COCHLEAR IMPLANT Right 02/15/2017   Procedure: RIGHT COCHLEAR IMPLANT;  Surgeon: Vicie Mutters, MD;  Location: [place];  Service: ENT;  Laterality: Right;   FINGER SURGERY     Right 4th finger   LIH  10/2009   Dr. Bary Castilla   LUMBAR SPINE SURGERY  06/2005   stenosis repair   PTOSIS REPAIR Bilateral 04/19/2019   Procedure: BLEPHAROPTOSIS REPAIR; RESECT SKIN;  Surgeon: Karle Starch, MD;  Location: [place];  Service: Ophthalmology;  Laterality: Bilateral;   TONSILLECTOMY  childhood  TOTAL KNEE ARTHROPLASTY  06/2008   bilat (Dr. Marry Guan)    Prior to Admission medications   Medication Sig Start Date End Date Taking? Authorizing Provider  acetaminophen (TYLENOL) 325 MG tablet Take 2 tablets (650 mg total) by mouth every 6 (six) hours as needed for mild pain (or Fever >/= 101). 09/28/20   Barb Merino, MD  apixaban (ELIQUIS) 2.5 MG TABS tablet Take 1 tablet (2.5 mg total) by mouth 2 (two) times daily. 01/05/20   Dessa Phi, DO  buPROPion (WELLBUTRIN XL) 150 MG 24 hr tablet TAKE 1 TABLET  BY MOUTH DAILY Patient taking differently: Take 150 mg by mouth every other day. Bedtime 03/02/20   [name], MD  clonazePAM (KLONOPIN) 0.5 MG tablet TAKE ONE TO TWO TABLETS AT BEDTIME 03/02/20   [name], MD  finasteride (PROSCAR) 5 MG tablet TAKE ONE TABLET BY MOUTH EVERY DAY 08/05/19   [name], MD  furosemide (LASIX) 80 MG tablet Take 80 mg by mouth daily.    [provider]  Multiple Vitamin (MULTIVITAMIN) capsule Take 1 capsule by mouth daily.    [provider]  omeprazole (PRILOSEC) 40 MG capsule TAKE 1 CAPSULE BY MOUTH ONCE DAILY 03/30/20   [name], MD  potassium chloride (KLOR-CON) 10 MEQ tablet Take 1 tablet (10 mEq total) by mouth 2 (two) times daily for 14 days. 09/28/20 12/03/20  Barb Merino, MD  pramipexole (MIRAPEX) 1 MG tablet TAKE ONE TABLET AT DINNER AND ONE TABLETAT BEDTIME Patient taking differently: Take 1 mg by mouth at bedtime. 09/16/19   [name], MD  pyridoxine (B-6) 100 MG tablet Take 100 mg by mouth daily.    [provider]  tamsulosin (FLOMAX) 0.4 MG CAPS capsule TAKE 1 CAPSULE EVERY DAY Patient taking differently: Take 0.4 mg by mouth 2 (two) times daily. 08/05/19   [name], MD  traZODone (DESYREL) 50 MG tablet Take 0.5 tablets (25 mg total) by mouth at bedtime as needed for sleep. 11/26/20   Jearld Fenton, NP  citalopram (CELEXA) 10 MG tablet Take 1 tablet (10 mg total) by mouth daily. 04/22/11 05/18/11  Viviana Simpler I, MD    Allergies Atorvastatin, Pravastatin sodium, and Statins  Family History  Problem Relation Age of Onset   Other Father        "clogged arteries"   Hypertension Mother     Social History Social History   Tobacco Use   Smoking status: Former    Packs/day: 0.75    Years: 30.00    Pack years: 22.50    Types: Cigarettes    Quit date: 02/28/1970    Years since quitting: 50.8   Smokeless tobacco: Never  Vaping Use   Vaping Use: Never used  Substance Use  Topics   Alcohol use: Yes    Alcohol/week: 0.0 standard drinks    Comment: 1-2 glasses wine/daily   Drug use: No    Review of Systems Constitutional: No fever/chills Eyes: No visual changes. ENT: No sore throat. Cardiovascular: Denies chest pain. Respiratory: Denies shortness of breath. Gastrointestinal: No abdominal pain.  No nausea, no vomiting.  No diarrhea.   Genitourinary: Negative for dysuria. Musculoskeletal: Negative for back pain. Skin: Negative for rash. Neurological: Negative for headaches, focal weakness or numbness.  ____________________________________________   PHYSICAL EXAM:  VITAL SIGNS: ED Triage Vitals  Enc Vitals Group     BP 12/17/20 1833 (!) 154/84     Pulse Rate 12/17/20 1833 74  Resp 12/17/20 1833 20     Temp 12/17/20 1833 98.5 F (36.9 C)     Temp Source 12/17/20 1833 Oral     SpO2 12/17/20 1833 95 %     Weight 12/17/20 1829 171 lb (77.6 kg)     Height 12/17/20 1829 5\' 7"  (1.702 m)     Head Circumference --      Peak Flow --      Pain Score 12/17/20 1829 5   Constitutional: Alert and oriented.  Eyes: Conjunctivae are normal.  ENT      Head: Normocephalic and atraumatic.      Nose: No congestion/rhinnorhea.      Mouth/Throat: Mucous membranes are moist.      Neck: No stridor. Hematological/Lymphatic/Immunilogical: No cervical lymphadenopathy. Cardiovascular: Normal rate, regular rhythm.  No murmurs, rubs, or gallops.  Respiratory: Normal respiratory effort without tachypnea nor retractions. Breath sounds are clear and equal bilaterally. No wheezes/rales/rhonchi. Gastrointestinal: Soft and non tender. No rebound. No guarding.  Genitourinary: Deferred Musculoskeletal: Normal range of motion in all extremities. No lower extremity edema. Neurologic:  Normal speech and language. No gross focal neurologic deficits are appreciated.  Skin:  Skin is warm, dry and intact. No rash noted. Psychiatric:  Calm  ____________________________________________    LABS (pertinent positives/negatives)  None  ____________________________________________   EKG  None  ____________________________________________    RADIOLOGY  None  ____________________________________________   PROCEDURES  Procedures  ____________________________________________   INITIAL IMPRESSION / ASSESSMENT AND PLAN / ED COURSE  Pertinent labs & imaging results that were available during my care of the patient were reviewed by me and considered in my medical decision making (see chart for details).   Patient presents to the emergency department today because of concern for SI, comes in under IVC. Will have psychiatry evaluate.  The patient has been placed in psychiatric observation due to the need to provide a safe environment for the patient while obtaining psychiatric consultation and evaluation, as well as ongoing medical and medication management to treat the patient's condition.  The patient has been placed under full IVC at this time.   ____________________________________________   FINAL CLINICAL IMPRESSION(S) / ED DIAGNOSES  Suicidal thoughts    Note: This dictation was prepared with Dragon dictation. Any transcriptional errors that result from this process are unintentional     Nance Pear, MD 12/17/20 2026

## 2020-12-17 NOTE — ED Notes (Signed)
Pt unable to swallow medications due to size. Dr. Tamala Julian notified, states he will change order shortly

## 2020-12-17 NOTE — ED Notes (Signed)
While collecting pt COVID swab he repeatedly asked this nurse "can I die now?' PT is so hard of hearing despite having hearing aid in that he could not hear this nurse communication. Pt remains in room now

## 2020-12-17 NOTE — ED Notes (Signed)
Pt states he is 87y/o and lived a good life but is tired. States that he wishes he could sleep and not wake up.

## 2020-12-18 DIAGNOSIS — F331 Major depressive disorder, recurrent, moderate: Secondary | ICD-10-CM | POA: Diagnosis not present

## 2020-12-18 LAB — RESP PANEL BY RT-PCR (FLU A&B, COVID) ARPGX2
Influenza A by PCR: NEGATIVE
Influenza B by PCR: NEGATIVE
SARS Coronavirus 2 by RT PCR: NEGATIVE

## 2020-12-18 LAB — MAGNESIUM: Magnesium: 1.7 mg/dL (ref 1.7–2.4)

## 2020-12-18 MED ORDER — BUPROPION HCL ER (XL) 150 MG PO TB24: 150.0000 mg | ORAL_TABLET | Freq: Every day | ORAL | 1 refills | Status: AC

## 2020-12-18 MED ORDER — FINASTERIDE 5 MG PO TABS
5.0000 mg | ORAL_TABLET | Freq: Every day | ORAL | Status: DC
  Administered 2020-12-18: 5 mg via ORAL
  Filled 2020-12-18 (×2): qty 1

## 2020-12-18 MED ORDER — FUROSEMIDE 40 MG PO TABS
80.0000 mg | ORAL_TABLET | Freq: Every day | ORAL | Status: DC
  Administered 2020-12-18: 80 mg via ORAL
  Filled 2020-12-18: qty 2

## 2020-12-18 MED ORDER — POTASSIUM CHLORIDE 20 MEQ PO PACK
80.0000 meq | PACK | Freq: Two times a day (BID) | ORAL | Status: DC
  Administered 2020-12-18 (×2): 80 meq via ORAL
  Filled 2020-12-18 (×2): qty 4

## 2020-12-18 MED ORDER — TRAZODONE HCL 50 MG PO TABS: 25.0000 mg | ORAL_TABLET | Freq: Every evening | ORAL | Status: DC | PRN

## 2020-12-18 MED ORDER — PANTOPRAZOLE SODIUM 40 MG PO TBEC
40.0000 mg | DELAYED_RELEASE_TABLET | Freq: Every day | ORAL | Status: DC
  Administered 2020-12-18: 40 mg via ORAL
  Filled 2020-12-18: qty 1

## 2020-12-18 MED ORDER — APIXABAN 2.5 MG PO TABS
2.5000 mg | ORAL_TABLET | Freq: Two times a day (BID) | ORAL | Status: DC
  Administered 2020-12-18: 2.5 mg via ORAL
  Filled 2020-12-18 (×3): qty 1

## 2020-12-18 MED ORDER — HYDRALAZINE HCL 50 MG PO TABS
25.0000 mg | ORAL_TABLET | Freq: Four times a day (QID) | ORAL | Status: DC
  Administered 2020-12-18 (×2): 25 mg via ORAL
  Filled 2020-12-18 (×2): qty 1

## 2020-12-18 MED ORDER — POTASSIUM CHLORIDE CRYS ER 20 MEQ PO TBCR: 20.0000 meq | EXTENDED_RELEASE_TABLET | Freq: Two times a day (BID) | ORAL | 0 refills | Status: AC

## 2020-12-18 MED ORDER — VITAMIN B-6 50 MG PO TABS
100.0000 mg | ORAL_TABLET | Freq: Every day | ORAL | Status: DC
  Administered 2020-12-18: 100 mg via ORAL
  Filled 2020-12-18 (×2): qty 2

## 2020-12-18 NOTE — ED Provider Notes (Signed)
-----------------------------------------   1:49 PM on 12/18/2020 -----------------------------------------  Patient has been seen and evaluated by psychiatry.  They believe the patient safe for discharge home from a psychiatric standpoint.  Patient's medical work-up is been largely nonrevealing besides hypokalemia.  We will place the patient on potassium supplements for the next 7 days have him follow-up with his doctor to recheck his labs.   Harvest Dark, MD 12/18/20 1356

## 2020-12-18 NOTE — ED Notes (Signed)
Pt has cough present, reports chronic. Pt sounds as though he has mucus build up in his throat and then he clears it

## 2020-12-18 NOTE — ED Notes (Addendum)
Pt assisted to bedside commode by 2 RN's.  Unmeasured urine occurrence.

## 2020-12-18 NOTE — ED Notes (Signed)
Pt assisted to [facility].  Unmeasured urine occurrence.

## 2020-12-18 NOTE — ED Notes (Signed)
Pt in a panic in room yelling he needs to use restroom and have BM. Pt provided with bedside commode. This nurse and Lattie Haw, Tech assist pt to stand. Pt unsteady on feet.

## 2020-12-18 NOTE — Consult Note (Signed)
[facility] Consult   Reason for Consult: Psychiatric Evaluation   Referring Physician: Dr. Archie Balboa Patient Identification: Stephen Garrett MRN:  [id number] Principal Diagnosis: <principal problem not specified> Diagnosis:  Active Problems:   Chronic kidney disease, stage IV (severe) ([place])   BPH with obstruction/lower urinary tract symptoms   SPINAL STENOSIS, LUMBAR   Episodic mood disorder ([place])   Advanced directives, counseling/discussion   Generalized weakness   TIA (transient ischemic attack)   A-fib ([place])   Chronic diastolic heart failure ([place])   Bilateral lower extremity edema   Insomnia   Fall   Depression with anxiety   MDD (major depressive disorder), recurrent episode, moderate ([place])   Total Time spent with patient: 45 minutes  Subjective: "Take me home so I can get back on my computer and look up how to die."  Stephen Garrett is a 86 y.o. male patient presented to [facility] ED via law enforcement under involuntarily commitment status (IVC). Per the IVC paper work, the patient has googled how not to wake up after you go to sleep. The patient presented with, "I wanna die, simple as that." The patient was adamant that he no longer wished to live.  The patient was seen face-to-face by this provider; the chart was reviewed and consulted with Dr. Archie Balboa on 12/17/2020 due to the patient's care. It was discussed with the EDP that the patient does meet the criteria to be admitted to the geriatric-psychiatric inpatient unit.  On evaluation, the patient is alert and oriented x 4, irritated, hard of hearing,  uncooperative, and mood-congruent with affect. The patient does not appear to be responding to internal or external stimuli. Neither is the patient presenting with any delusional thinking. The patient admits to suicidal ideations with a plan of going to sleep and not waking up. The patient perseverated about having lived long enough. The patient had impaired insight and  poor judgment.  HPI: Per Dr. Archie Balboa, Stephen Garrett is a 87 y.o. male who presents to the emergency department today because of concern for suicidal ideation. Patient presented under IVC. The patient says that he was trying to go to sleep tonight and not wake up. Says he has lived long enough.   Past Psychiatric History:  vascular dementia.  Risk to Self:   Risk to Others:   Prior Inpatient Therapy:   Prior Outpatient Therapy:    Past Medical History:  Past Medical History:  Diagnosis Date   Actinic keratosis    BPH (benign prostatic hypertrophy)    CKD (chronic kidney disease), stage IV (HCC)    Diseases of lips    Diverticulosis of colon 06/2003   ED (erectile dysfunction)    Frequent falls    GERD (gastroesophageal reflux disease)    Heart murmur    as child   HLD (hyperlipidemia)    HOH (hard of hearing)    Cochlear implant right, hearing aid left   HTN (hypertension)    LVH (left ventricular hypertrophy)    a. 01/2020 Echo: EF 60-65%, no rwma, sev LVH. Nl RV size/fxn. Sev BAE. Mild MR.   Lymphedema    Numbness and tingling in both hands    intermittent, trying braces    Osteoarthritis of knee    severe, bilat   Permanent atrial fibrillation (HCC)    Restless legs syndrome (RLS)    Sleep disturbance, unspecified    Spinal stenosis, lumbar region, without neurogenic claudication     Past Surgical History:  Procedure Laterality  Date   CATARACT EXTRACTION, BILATERAL  2011   Dr. Thomasene Ripple   COCHLEAR IMPLANT Right 02/15/2017   Procedure: RIGHT COCHLEAR IMPLANT;  Surgeon: Vicie Mutters, MD;  Location: [place] [facility];  Service: ENT;  Laterality: Right;   FINGER SURGERY     Right 4th finger   LIH  10/2009   Dr. Bary Castilla   LUMBAR SPINE SURGERY  06/2005   stenosis repair   PTOSIS REPAIR Bilateral 04/19/2019   Procedure: BLEPHAROPTOSIS REPAIR; RESECT SKIN;  Surgeon: Karle Starch, MD;  Location: [facility];  Service: Ophthalmology;  Laterality:  Bilateral;   TONSILLECTOMY  childhood   TOTAL KNEE ARTHROPLASTY  06/2008   bilat (Dr. Marry Guan)   Family History:  Family History  Problem Relation Age of Onset   Other Father        "clogged arteries"   Hypertension Mother    Family Psychiatric  History:  Social History:  Social History   Substance and Sexual Activity  Alcohol Use Yes   Alcohol/week: 0.0 standard drinks   Comment: 1-2 glasses wine/daily     Social History   Substance and Sexual Activity  Drug Use No    Social History   Socioeconomic History   Marital status: Widowed    Spouse name: Not on file   Number of children: 2   Years of education: Not on file   Highest education level: Not on file  Occupational History   Occupation: Retired  Tobacco Use   Smoking status: Former    Packs/day: 0.75    Years: 30.00    Pack years: 22.50    Types: Cigarettes    Quit date: 02/28/1970    Years since quitting: 50.8   Smokeless tobacco: Never  Vaping Use   Vaping Use: Never used  Substance and Sexual Activity   Alcohol use: Yes    Alcohol/week: 0.0 standard drinks    Comment: 1-2 glasses wine/daily   Drug use: No   Sexual activity: Not on file  Other Topics Concern   Not on file  Social History Narrative   Widowed 2010   2 adopted children   Has living will   Daughter is now health care power of attorney   Would accept resuscitation but no prolonged ventilation   No tube feeds if cognitively unaware   Social Determinants of Health   Financial Resource Strain: Not on file  Food Insecurity: Not on file  Transportation Needs: Not on file  Physical Activity: Not on file  Stress: Not on file  Social Connections: Not on file   Additional Social History:    Allergies:   Allergies  Allergen Reactions   Atorvastatin     REACTION: raises blood pressure   Pravastatin Sodium     REACTION: raises blood pressure   Statins     REACTION: raises bp    Labs:  Results for orders placed or performed  during the hospital encounter of 12/17/20 (from the past 48 hour(s))  Resp Panel by RT-PCR (Flu A&B, Covid) Nasopharyngeal Swab     Status: None   Collection Time: 12/17/20 11:01 PM   Specimen: Nasopharyngeal Swab; Nasopharyngeal(NP) swabs in vial transport medium  Result Value Ref Range   SARS Coronavirus 2 by RT PCR NEGATIVE NEGATIVE    Comment: (NOTE) SARS-CoV-2 target nucleic acids are NOT DETECTED.  The SARS-CoV-2 RNA is generally detectable in upper respiratory specimens during the acute phase of infection. The lowest concentration of SARS-CoV-2 viral copies this assay can  detect is 138 copies/mL. A negative result does not preclude SARS-Cov-2 infection and should not be used as the sole basis for treatment or other patient management decisions. A negative result may occur with  improper specimen collection/handling, submission of specimen other than nasopharyngeal swab, presence of viral mutation(s) within the areas targeted by this assay, and inadequate number of viral copies(<138 copies/mL). A negative result must be combined with clinical observations, patient history, and epidemiological information. The expected result is Negative.  Fact Sheet for Patients:  EntrepreneurPulse.com.au  Fact Sheet for Healthcare Providers:  IncredibleEmployment.be  This test is no t yet approved or cleared by the [place] FDA and  has been authorized for detection and/or diagnosis of SARS-CoV-2 by FDA under an Emergency Use Authorization (EUA). This EUA will remain  in effect (meaning this test can be used) for the duration of the COVID-19 declaration under Section 564(b)(1) of the Act, 21 U.S.C.section 360bbb-3(b)(1), unless the authorization is terminated  or revoked sooner.       Influenza A by PCR NEGATIVE NEGATIVE   Influenza B by PCR NEGATIVE NEGATIVE    Comment: (NOTE) The Xpert Xpress SARS-CoV-2/FLU/RSV plus assay is intended as an  aid in the diagnosis of influenza from Nasopharyngeal swab specimens and should not be used as a sole basis for treatment. Nasal washings and aspirates are unacceptable for Xpert Xpress SARS-CoV-2/FLU/RSV testing.  Fact Sheet for Patients: EntrepreneurPulse.com.au  Fact Sheet for Healthcare Providers: IncredibleEmployment.be  This test is not yet approved or cleared by the [place] FDA and has been authorized for detection and/or diagnosis of SARS-CoV-2 by FDA under an Emergency Use Authorization (EUA). This EUA will remain in effect (meaning this test can be used) for the duration of the COVID-19 declaration under Section 564(b)(1) of the Act, 21 U.S.C. section 360bbb-3(b)(1), unless the authorization is terminated or revoked.  Performed at [facility], [address] [place], [place] [address]   CBC with Differential     Status: Abnormal   Collection Time: 12/17/20 11:01 PM  Result Value Ref Range   WBC 5.4 4.0 - 10.5 K/uL   RBC 3.65 (L) 4.22 - 5.81 MIL/uL   Hemoglobin 11.5 (L) 13.0 - 17.0 g/dL   HCT 34.2 (L) 39.0 - 52.0 %   MCV 93.7 80.0 - 100.0 fL   MCH 31.5 26.0 - 34.0 pg   MCHC 33.6 30.0 - 36.0 g/dL   RDW 15.4 11.5 - 15.5 %   Platelets 164 150 - 400 K/uL   nRBC 0.0 0.0 - 0.2 %   Neutrophils Relative % 66 %   Neutro Abs 3.6 1.7 - 7.7 K/uL   Lymphocytes Relative 26 %   Lymphs Abs 1.4 0.7 - 4.0 K/uL   Monocytes Relative 6 %   Monocytes Absolute 0.3 0.1 - 1.0 K/uL   Eosinophils Relative 1 %   Eosinophils Absolute 0.1 0.0 - 0.5 K/uL   Basophils Relative 0 %   Basophils Absolute 0.0 0.0 - 0.1 K/uL   Immature Granulocytes 1 %   Abs Immature Granulocytes 0.03 0.00 - 0.07 K/uL    Comment: Performed at [facility], [address] [place], [place] [address]  Basic metabolic panel     Status: Abnormal   Collection Time: 12/17/20 11:01 PM  Result Value Ref Range   Sodium 138 135 - 145 mmol/L   Potassium 2.6  (LL) 3.5 - 5.1 mmol/L    Comment: CRITICAL RESULT CALLED TO, READ BACK BY AND VERIFIED WITH CHRIS  BUCKNER @2341  ON 12/17/20 SKL    Chloride 100 98 - 111 mmol/L   CO2 29 22 - 32 mmol/L   Glucose, Bld 102 (H) 70 - 99 mg/dL    Comment: Glucose reference range applies only to samples taken after fasting for at least 8 hours.   BUN 44 (H) 8 - 23 mg/dL   Creatinine, Ser 2.13 (H) 0.61 - 1.24 mg/dL   Calcium 9.0 8.9 - 10.3 mg/dL   GFR, Estimated 28 (L) >60 mL/min    Comment: (NOTE) Calculated using the CKD-EPI Creatinine Equation (2021)    Anion gap 9 5 - 15    Comment: Performed at [facility], [address] [place], [place] [address]  Magnesium     Status: None   Collection Time: 12/17/20 11:01 PM  Result Value Ref Range   Magnesium 1.7 1.7 - 2.4 mg/dL    Comment: Performed at [facility], [address] [place], [place] [address]    Current Facility-Administered Medications  Medication Dose Route Frequency Provider Last Rate Last Admin   potassium chloride (KLOR-CON) packet 80 mEq  80 mEq Oral BID Lucrezia Starch, MD       Current Outpatient Medications  Medication Sig Dispense Refill   acetaminophen (TYLENOL) 325 MG tablet Take 2 tablets (650 mg total) by mouth every 6 (six) hours as needed for mild pain (or Fever >/= 101).     hydrALAZINE (APRESOLINE) 25 MG tablet Take 25 mg by mouth 4 (four) times daily.     Multiple Vitamin (MULTIVITAMIN) capsule Take 1 capsule by mouth daily.     PANTOPRAZOLE SODIUM PO Take 1 tablet by mouth daily.     pyridoxine (B-6) 100 MG tablet Take 100 mg by mouth daily.     tamsulosin (FLOMAX) 0.4 MG CAPS capsule TAKE 1 CAPSULE EVERY DAY (Patient taking differently: Take 0.4 mg by mouth in the morning and at bedtime.) 90 capsule 3   apixaban (ELIQUIS) 2.5 MG TABS tablet Take 1 tablet (2.5 mg total) by mouth 2 (two) times daily. 60 tablet 1   buPROPion (WELLBUTRIN XL) 150 MG 24 hr tablet TAKE 1 TABLET BY MOUTH DAILY (Patient not  taking: Reported on 12/17/2020) 90 tablet 3   clonazePAM (KLONOPIN) 0.5 MG tablet TAKE ONE TO TWO TABLETS AT BEDTIME (Patient not taking: Reported on 12/17/2020) 180 tablet 0   finasteride (PROSCAR) 5 MG tablet TAKE ONE TABLET BY MOUTH EVERY DAY (Patient not taking: Reported on 12/17/2020) 90 tablet 3   furosemide (LASIX) 80 MG tablet Take 80 mg by mouth daily. (Patient not taking: Reported on 12/17/2020)     omeprazole (PRILOSEC) 40 MG capsule TAKE 1 CAPSULE BY MOUTH ONCE DAILY (Patient not taking: Reported on 12/17/2020) 90 capsule 3   potassium chloride (KLOR-CON) 10 MEQ tablet Take 1 tablet (10 mEq total) by mouth 2 (two) times daily for 14 days. 28 tablet 0   pramipexole (MIRAPEX) 1 MG tablet TAKE ONE TABLET AT DINNER AND ONE TABLETAT BEDTIME (Patient not taking: Reported on 12/17/2020) 180 tablet 3   traZODone (DESYREL) 50 MG tablet Take 0.5 tablets (25 mg total) by mouth at bedtime as needed for sleep. 30 tablet 3    Musculoskeletal: Strength & Muscle Tone: decreased Gait & Station: unsteady Patient leans: Backward  Psychiatric Specialty Exam:  Presentation  General Appearance: Appropriate for Environment  Eye Contact:Good  Speech:Clear and Coherent  Speech Volume:Increased  Handedness:Right   Mood and Affect  Mood:Anxious; Irritable  Affect:Blunt; Depressed; Congruent  Thought Process  Thought Processes:Coherent  Descriptions of Associations:Intact  Orientation:Full (Time, Place and Person)  Thought Content:Logical  History of Schizophrenia/Schizoaffective disorder:No  Duration of Psychotic Symptoms:No data recorded Hallucinations:Hallucinations: None  Ideas of Reference:None  Suicidal Thoughts:Suicidal Thoughts: Yes, Active SI Active Intent and/or Plan: With Intent; With Plan; Without Plan; Without Access to Means  Homicidal Thoughts:Homicidal Thoughts: No   Sensorium  Memory:Immediate Fair; Recent Fair; Remote  Fair  Judgment:Poor  Insight:Poor   Executive Functions  Concentration:Fair  Attention Span:Good  [place]  Language:Good   Psychomotor Activity  Psychomotor Activity:Psychomotor Activity: Decreased   Assets  Assets:Communication Skills; Physical Health; Resilience; Social Support   Sleep  Sleep:Sleep: Fair   Physical Exam: Physical Exam Vitals and nursing note reviewed.  Constitutional:      General: He is in acute distress.     Appearance: He is normal weight.  HENT:     Head: Normocephalic and atraumatic.     Nose: Nose normal.     Mouth/Throat:     Mouth: Mucous membranes are moist.  Cardiovascular:     Rate and Rhythm: Normal rate.     Pulses: Normal pulses.  Pulmonary:     Effort: Pulmonary effort is normal.  Musculoskeletal:        General: Normal range of motion.     Cervical back: Normal range of motion and neck supple.  Neurological:     Mental Status: He is alert and oriented to person, place, and time.     Motor: Weakness present.     Gait: Gait abnormal.  Psychiatric:        Attention and Perception: Attention and perception normal.        Mood and Affect: Mood is anxious and depressed. Affect is angry.        Speech: Speech is delayed.        Behavior: Behavior is agitated and withdrawn.        Thought Content: Thought content includes suicidal ideation. Thought content includes suicidal plan.        Cognition and Memory: Cognition and memory normal.        Judgment: Judgment is impulsive and inappropriate.   Review of Systems  Psychiatric/Behavioral:  Positive for depression and suicidal ideas. The patient is nervous/anxious.   All other systems reviewed and are negative. Blood pressure (!) 154/84, pulse 74, temperature 98.5 F (36.9 C), temperature source Oral, resp. rate 20, height 5\' 7"  (1.702 m), weight 77.6 kg, SpO2 95 %. Body mass index is 26.78 kg/m.  Treatment Plan Summary: - Patient does meet  criteria for geriatric-psychiatric inpatient admission  Disposition: Recommend psychiatric Inpatient admission when medically cleared. Supportive therapy provided about ongoing stressors.  Caroline Sauger, NP 12/18/2020 12:38 AM

## 2020-12-18 NOTE — ED Notes (Signed)
Pt discharged with daughter back to [facility].  Prescription, D/C instructions and DNR given to daughter.

## 2020-12-18 NOTE — ED Notes (Signed)
Breakfast tray given. °

## 2020-12-18 NOTE — ED Notes (Signed)
Pt is awake and expresses to go home

## 2020-12-18 NOTE — ED Notes (Signed)
Pt required assist x2 to bedside commode

## 2020-12-18 NOTE — BH Assessment (Signed)
Referral information for [facility] faxed to:   [facility] ([phone]- [phone]),   [facility] [phone] -[phone]) [phone]  [facility] [phone]),  [facility] [phone] -or- [phone]),   [facility] [phone] or [phone]),

## 2020-12-18 NOTE — ED Notes (Signed)
Rescinded by Dr. Weber Cooks

## 2020-12-18 NOTE — Progress Notes (Addendum)
   Patient is IVC, and meets psychiatric inpatient criteria.  CSW spoke with RN at [facility], who advised the patient has recently been taken off his Wellbutrin, under a doctor's supervision.  The RN from [facility] stated the patient had been taking it for many years and after weaning, he was completely off the Wellbutrin on 12/14/2020.  The RN from [facility] stated soon after that the patient began verbalizing S/I.  CSW updated, Psychiatrist, EDP and EDRN.

## 2020-12-18 NOTE — Consult Note (Signed)
[facility] Face-to-Face Psychiatry Consult   Reason for Consult: Consult for this 87 year old man brought to the emergency room from [facility] retirement home.  Brought in because of statements about suicidal ideation Referring Physician: Paduchowski Patient Identification: Stephen Garrett MRN:  [id number] Principal Diagnosis: MDD (major depressive disorder), recurrent episode, moderate ([place]) Diagnosis:  Principal Problem:   MDD (major depressive disorder), recurrent episode, moderate ([place]) Active Problems:   Chronic kidney disease, stage IV (severe) ([place])   BPH with obstruction/lower urinary tract symptoms   SPINAL STENOSIS, LUMBAR   Episodic mood disorder ([place])   Advanced directives, counseling/discussion   Generalized weakness   TIA (transient ischemic attack)   A-fib ([place])   Chronic diastolic heart failure ([place])   Bilateral lower extremity edema   Insomnia   Fall   Depression with anxiety   Total Time spent with patient: 1 hour  Subjective:   Stephen Garrett is a 87 y.o. male patient admitted with "I have lived long enough".  HPI: Patient seen chart reviewed.  87 year old man who is currently living at [facility]  Staff became aware that he was talking about suicidal thoughts reportedly having ask at least 1 person what he could do to bring about his death.  Reports that he had been looking on the Internet for information about suicide.  On interview the patient acknowledges that he feels like he has lived long enough and that his quality of life has declined to where he wishes that he just would not wake up much of the time.  He denies however having any intention or plan to do anything to harm or kill himself.  He says he feels bad a lot of the time from his pain and illness and disability.  Somewhat rejects the label of "depression".  Denies problems with sleep or appetite.  Denies psychotic symptoms.  Acknowledges some worsening memory loss.  Spoke with his daughter and primary care  doctor as well.  Patient apparently has suffered a significant and rapid decline in his health over the past year with the loss of what had previously been very good function for his age.  Patient had recently been taken off of bupropion extended release 150 mg a day by his primary doctor out of concern that it may be contributing to falls risk.  Patient denies any alcohol or drug use.  Daughter reports that she had not ever been aware of the patient making any suicidal statements although she was aware that he had recently felt like he was tired of living.  Past Psychiatric History: Patient has a past history of depression but no hospitalizations no known suicide attempts.  Had previously been on bupropion XL 150 mg daily until recently discontinued.  Also prescribed clonazepam for anxiety.  Risk to Self:   Risk to Others:   Prior Inpatient Therapy:   Prior Outpatient Therapy:    Past Medical History:  Past Medical History:  Diagnosis Date   Actinic keratosis    BPH (benign prostatic hypertrophy)    CKD (chronic kidney disease), stage IV (HCC)    Diseases of lips    Diverticulosis of colon 06/2003   ED (erectile dysfunction)    Frequent falls    GERD (gastroesophageal reflux disease)    Heart murmur    as child   HLD (hyperlipidemia)    HOH (hard of hearing)    Cochlear implant right, hearing aid left   HTN (hypertension)    LVH (left ventricular hypertrophy)  a. 01/2020 Echo: EF 60-65%, no rwma, sev LVH. Nl RV size/fxn. Sev BAE. Mild MR.   Lymphedema    Numbness and tingling in both hands    intermittent, trying braces    Osteoarthritis of knee    severe, bilat   Permanent atrial fibrillation (HCC)    Restless legs syndrome (RLS)    Sleep disturbance, unspecified    Spinal stenosis, lumbar region, without neurogenic claudication     Past Surgical History:  Procedure Laterality Date   CATARACT EXTRACTION, BILATERAL  2011   Dr. Thomasene Ripple   COCHLEAR IMPLANT Right  02/15/2017   Procedure: RIGHT COCHLEAR IMPLANT;  Surgeon: Vicie Mutters, MD;  Location: [place];  Service: ENT;  Laterality: Right;   FINGER SURGERY     Right 4th finger   LIH  10/2009   Dr. Bary Castilla   LUMBAR SPINE SURGERY  06/2005   stenosis repair   PTOSIS REPAIR Bilateral 04/19/2019   Procedure: BLEPHAROPTOSIS REPAIR; RESECT SKIN;  Surgeon: Karle Starch, MD;  Location: [place];  Service: Ophthalmology;  Laterality: Bilateral;   TONSILLECTOMY  childhood   TOTAL KNEE ARTHROPLASTY  06/2008   bilat (Dr. Marry Guan)   Family History:  Family History  Problem Relation Age of Onset   Other Father        "clogged arteries"   Hypertension Mother    Family Psychiatric  History: None reported Social History:  Social History   Substance and Sexual Activity  Alcohol Use Yes   Alcohol/week: 0.0 standard drinks   Comment: 1-2 glasses wine/daily     Social History   Substance and Sexual Activity  Drug Use No    Social History   Socioeconomic History   Marital status: Widowed    Spouse name: Not on file   Number of children: 2   Years of education: Not on file   Highest education level: Not on file  Occupational History   Occupation: Retired  Tobacco Use   Smoking status: Former    Packs/day: 0.75    Years: 30.00    Pack years: 22.50    Types: Cigarettes    Quit date: 02/28/1970    Years since quitting: 50.8   Smokeless tobacco: Never  Vaping Use   Vaping Use: Never used  Substance and Sexual Activity   Alcohol use: Yes    Alcohol/week: 0.0 standard drinks    Comment: 1-2 glasses wine/daily   Drug use: No   Sexual activity: Not on file  Other Topics Concern   Not on file  Social History Narrative   Widowed 2010   2 adopted children   Has living will   Daughter is now health care power of attorney   Would accept resuscitation but no prolonged ventilation   No tube feeds if cognitively unaware   Social Determinants of Health   Financial  Resource Strain: Not on file  Food Insecurity: Not on file  Transportation Needs: Not on file  Physical Activity: Not on file  Stress: Not on file  Social Connections: Not on file   Additional Social History:    Allergies:   Allergies  Allergen Reactions   Atorvastatin     REACTION: raises blood pressure   Pravastatin Sodium     REACTION: raises blood pressure   Statins     REACTION: raises bp    Labs:  Results for orders placed or performed during the hospital encounter of 12/17/20 (from the past 48 hour(s))  Resp Panel  by RT-PCR (Flu A&B, Covid) Nasopharyngeal Swab     Status: None   Collection Time: 12/17/20 11:01 PM   Specimen: Nasopharyngeal Swab; Nasopharyngeal(NP) swabs in vial transport medium  Result Value Ref Range   SARS Coronavirus 2 by RT PCR NEGATIVE NEGATIVE    Comment: (NOTE) SARS-CoV-2 target nucleic acids are NOT DETECTED.  The SARS-CoV-2 RNA is generally detectable in upper respiratory specimens during the acute phase of infection. The lowest concentration of SARS-CoV-2 viral copies this assay can detect is 138 copies/mL. A negative result does not preclude SARS-Cov-2 infection and should not be used as the sole basis for treatment or other patient management decisions. A negative result may occur with  improper specimen collection/handling, submission of specimen other than nasopharyngeal swab, presence of viral mutation(s) within the areas targeted by this assay, and inadequate number of viral copies(<138 copies/mL). A negative result must be combined with clinical observations, patient history, and epidemiological information. The expected result is Negative.  Fact Sheet for Patients:  EntrepreneurPulse.com.au  Fact Sheet for Healthcare Providers:  IncredibleEmployment.be  This test is no t yet approved or cleared by the [place] FDA and  has been authorized for detection and/or diagnosis of SARS-CoV-2  by FDA under an Emergency Use Authorization (EUA). This EUA will remain  in effect (meaning this test can be used) for the duration of the COVID-19 declaration under Section 564(b)(1) of the Act, 21 U.S.C.section 360bbb-3(b)(1), unless the authorization is terminated  or revoked sooner.       Influenza A by PCR NEGATIVE NEGATIVE   Influenza B by PCR NEGATIVE NEGATIVE    Comment: (NOTE) The Xpert Xpress SARS-CoV-2/FLU/RSV plus assay is intended as an aid in the diagnosis of influenza from Nasopharyngeal swab specimens and should not be used as a sole basis for treatment. Nasal washings and aspirates are unacceptable for Xpert Xpress SARS-CoV-2/FLU/RSV testing.  Fact Sheet for Patients: EntrepreneurPulse.com.au  Fact Sheet for Healthcare Providers: IncredibleEmployment.be  This test is not yet approved or cleared by the [place] FDA and has been authorized for detection and/or diagnosis of SARS-CoV-2 by FDA under an Emergency Use Authorization (EUA). This EUA will remain in effect (meaning this test can be used) for the duration of the COVID-19 declaration under Section 564(b)(1) of the Act, 21 U.S.C. section 360bbb-3(b)(1), unless the authorization is terminated or revoked.  Performed at [facility], [address] [place], [place] [address]   CBC with Differential     Status: Abnormal   Collection Time: 12/17/20 11:01 PM  Result Value Ref Range   WBC 5.4 4.0 - 10.5 K/uL   RBC 3.65 (L) 4.22 - 5.81 MIL/uL   Hemoglobin 11.5 (L) 13.0 - 17.0 g/dL   HCT 34.2 (L) 39.0 - 52.0 %   MCV 93.7 80.0 - 100.0 fL   MCH 31.5 26.0 - 34.0 pg   MCHC 33.6 30.0 - 36.0 g/dL   RDW 15.4 11.5 - 15.5 %   Platelets 164 150 - 400 K/uL   nRBC 0.0 0.0 - 0.2 %   Neutrophils Relative % 66 %   Neutro Abs 3.6 1.7 - 7.7 K/uL   Lymphocytes Relative 26 %   Lymphs Abs 1.4 0.7 - 4.0 K/uL   Monocytes Relative 6 %   Monocytes Absolute 0.3 0.1 - 1.0 K/uL    Eosinophils Relative 1 %   Eosinophils Absolute 0.1 0.0 - 0.5 K/uL   Basophils Relative 0 %   Basophils Absolute 0.0 0.0 - 0.1 K/uL  Immature Granulocytes 1 %   Abs Immature Granulocytes 0.03 0.00 - 0.07 K/uL    Comment: Performed at [facility], [address] [place], [place] [address]  Basic metabolic panel     Status: Abnormal   Collection Time: 12/17/20 11:01 PM  Result Value Ref Range   Sodium 138 135 - 145 mmol/L   Potassium 2.6 (LL) 3.5 - 5.1 mmol/L    Comment: CRITICAL RESULT CALLED TO, READ BACK BY AND VERIFIED WITH CHRIS BUCKNER @2341  ON 12/17/20 SKL    Chloride 100 98 - 111 mmol/L   CO2 29 22 - 32 mmol/L   Glucose, Bld 102 (H) 70 - 99 mg/dL    Comment: Glucose reference range applies only to samples taken after fasting for at least 8 hours.   BUN 44 (H) 8 - 23 mg/dL   Creatinine, Ser 2.13 (H) 0.61 - 1.24 mg/dL   Calcium 9.0 8.9 - 10.3 mg/dL   GFR, Estimated 28 (L) >60 mL/min    Comment: (NOTE) Calculated using the CKD-EPI Creatinine Equation (2021)    Anion gap 9 5 - 15    Comment: Performed at [facility], [address], [place], [place] [address]  Magnesium     Status: None   Collection Time: 12/17/20 11:01 PM  Result Value Ref Range   Magnesium 1.7 1.7 - 2.4 mg/dL    Comment: Performed at [facility], [address], [place], [place] [address]    Current Facility-Administered Medications  Medication Dose Route Frequency Provider Last Rate Last Admin   apixaban (ELIQUIS) tablet 2.5 mg  2.5 mg Oral BID Lucrezia Starch, MD   2.5 mg at 12/18/20 0914   finasteride (PROSCAR) tablet 5 mg  5 mg Oral Daily Lucrezia Starch, MD   5 mg at 12/18/20 0914   furosemide (LASIX) tablet 80 mg  80 mg Oral Daily Lucrezia Starch, MD   80 mg at 12/18/20 9233   hydrALAZINE (APRESOLINE) tablet 25 mg  25 mg Oral QID Lucrezia Starch, MD   25 mg at 12/18/20 1310   pantoprazole (PROTONIX) EC tablet 40 mg  40 mg Oral Daily Lucrezia Starch, MD   40  mg at 12/18/20 0919   potassium chloride (KLOR-CON) packet 80 mEq  80 mEq Oral BID Lucrezia Starch, MD   80 mEq at 12/18/20 0919   pyridOXINE (VITAMIN B-6) tablet 100 mg  100 mg Oral Daily Lucrezia Starch, MD   100 mg at 12/18/20 0076   traZODone (DESYREL) tablet 25 mg  25 mg Oral QHS PRN Lucrezia Starch, MD       Current Outpatient Medications  Medication Sig Dispense Refill   acetaminophen (TYLENOL) 325 MG tablet Take 2 tablets (650 mg total) by mouth every 6 (six) hours as needed for mild pain (or Fever >/= 101).     hydrALAZINE (APRESOLINE) 25 MG tablet Take 25 mg by mouth 4 (four) times daily.     Multiple Vitamin (MULTIVITAMIN) capsule Take 1 capsule by mouth daily.     PANTOPRAZOLE SODIUM PO Take 1 tablet by mouth daily.     potassium chloride SA (KLOR-CON) 20 MEQ tablet Take 1 tablet (20 mEq total) by mouth 2 (two) times daily. 14 tablet 0   pyridoxine (B-6) 100 MG tablet Take 100 mg by mouth daily.     tamsulosin (FLOMAX) 0.4 MG CAPS capsule TAKE 1 CAPSULE EVERY DAY (Patient taking differently: Take 0.4 mg by mouth in the  morning and at bedtime.) 90 capsule 3   apixaban (ELIQUIS) 2.5 MG TABS tablet Take 1 tablet (2.5 mg total) by mouth 2 (two) times daily. 60 tablet 1   buPROPion (WELLBUTRIN XL) 150 MG 24 hr tablet Take 1 tablet (150 mg total) by mouth daily. 30 tablet 1   clonazePAM (KLONOPIN) 0.5 MG tablet TAKE ONE TO TWO TABLETS AT BEDTIME (Patient not taking: Reported on 12/17/2020) 180 tablet 0   finasteride (PROSCAR) 5 MG tablet TAKE ONE TABLET BY MOUTH EVERY DAY (Patient not taking: Reported on 12/17/2020) 90 tablet 3   furosemide (LASIX) 80 MG tablet Take 80 mg by mouth daily. (Patient not taking: Reported on 12/17/2020)     omeprazole (PRILOSEC) 40 MG capsule TAKE 1 CAPSULE BY MOUTH ONCE DAILY (Patient not taking: Reported on 12/17/2020) 90 capsule 3   pramipexole (MIRAPEX) 1 MG tablet TAKE ONE TABLET AT DINNER AND ONE TABLETAT BEDTIME (Patient not taking: Reported on  12/17/2020) 180 tablet 3   traZODone (DESYREL) 50 MG tablet Take 0.5 tablets (25 mg total) by mouth at bedtime as needed for sleep. 30 tablet 3    Musculoskeletal: Strength & Muscle Tone: within normal limits Gait & Station: unsteady Patient leans: N/A            Psychiatric Specialty Exam:  Presentation  General Appearance: Appropriate for Environment  Eye Contact:Good  Speech:Clear and Coherent  Speech Volume:Increased  Handedness:Right   Mood and Affect  Mood:Anxious; Irritable  Affect:Blunt; Depressed; Congruent   Thought Process  Thought Processes:Coherent  Descriptions of Associations:Intact  Orientation:Full (Time, Place and Person)  Thought Content:Logical  History of Schizophrenia/Schizoaffective disorder:No  Duration of Psychotic Symptoms:No data recorded Hallucinations:Hallucinations: None  Ideas of Reference:None  Suicidal Thoughts:Suicidal Thoughts: Yes, Active SI Active Intent and/or Plan: With Intent; With Plan; Without Plan; Without Access to Means  Homicidal Thoughts:Homicidal Thoughts: No   Sensorium  Memory:Immediate Fair; Recent Fair; Remote Fair  Judgment:Poor  Insight:Poor   Executive Functions  Concentration:Fair  Attention Span:Good  [place]  Language:Good   Psychomotor Activity  Psychomotor Activity:Psychomotor Activity: Decreased   Assets  Assets:Communication Skills; Physical Health; Resilience; Social Support   Sleep  Sleep:Sleep: Fair   Physical Exam: Physical Exam Vitals and nursing note reviewed.  Constitutional:      Appearance: Normal appearance.  HENT:     Head: Normocephalic and atraumatic.     Right Ear: Decreased hearing noted.     Left Ear: Decreased hearing noted.     Mouth/Throat:     Pharynx: Oropharynx is clear.  Eyes:     Pupils: Pupils are equal, round, and reactive to light.  Cardiovascular:     Rate and Rhythm: Normal rate and regular  rhythm.  Pulmonary:     Effort: Pulmonary effort is normal.     Breath sounds: Normal breath sounds.  Abdominal:     General: Abdomen is flat.     Palpations: Abdomen is soft.  Musculoskeletal:        General: Normal range of motion.       Legs:  Skin:    General: Skin is warm and dry.  Neurological:     General: No focal deficit present.     Mental Status: He is alert. Mental status is at baseline.  Psychiatric:        Attention and Perception: He is inattentive.        Mood and Affect: Affect is blunt.        Speech:  Speech normal.        Behavior: Behavior is slowed.        Thought Content: Thought content includes suicidal ideation. Thought content does not include suicidal plan.        Cognition and Memory: Memory is impaired.   Review of Systems  Constitutional:  Positive for malaise/fatigue.  HENT: Negative.    Eyes: Negative.   Respiratory: Negative.    Cardiovascular: Negative.   Gastrointestinal: Negative.   Musculoskeletal:  Positive for joint pain and neck pain.  Skin: Negative.   Neurological: Negative.   Psychiatric/Behavioral:  Positive for depression, memory loss and suicidal ideas. Negative for hallucinations and substance abuse. The patient is not nervous/anxious and does not have insomnia.   Blood pressure (!) 164/102, pulse 83, temperature 98.6 F (37 C), temperature source Oral, resp. rate 20, height 5\' 7"  (1.702 m), weight 77.6 kg, SpO2 95 %. Body mass index is 26.78 kg/m.  Treatment Plan Summary: Medication management and Plan patient very much wants to go back to his retirement community and not to be admitted to the geriatric psychiatry ward.  Patient repeatedly stated to all observers that he had no intention or plan of killing himself.  After speaking with his daughter she also feels like the better course of action would be returned to his living facility.  He is under close observation there and has no access to any real means to harm himself.  She  feels that admission to the hospital would probably just be more upsetting to him.  Spoke with primary care doctor Dr. Silvio Pate.  We agreed that we will try restarting the Wellbutrin extended release 150 mg once a day and I have provided a prescription for that.  Dr. Silvio Pate will follow up with the patient on Monday.  Discontinued IVC.  Case reviewed with emergency room physician.  Disposition: Patient does not meet criteria for psychiatric inpatient admission. Supportive therapy provided about ongoing stressors. Discussed crisis plan, support from social network, calling 911, coming to the Emergency Department, and calling Suicide Hotline.  Alethia Berthold, MD 12/18/2020 2:21 PM

## 2020-12-18 NOTE — ED Notes (Signed)
Pt's daughter returned call and will pick up pt between 4  - 5 pm.

## 2020-12-18 NOTE — ED Notes (Signed)
RN left voicemail for pt's daughter informing her of the plan to discharge patient. Requested return call.

## 2020-12-18 NOTE — ED Notes (Signed)
Pt asleep at this time, unable to collect vitals. Will collect pt vitals once awake. 

## 2020-12-18 NOTE — ED Notes (Signed)
IVC pending placement 

## 2020-12-19 DIAGNOSIS — Z8673 Personal history of transient ischemic attack (TIA), and cerebral infarction without residual deficits: Secondary | ICD-10-CM | POA: Diagnosis not present

## 2020-12-19 DIAGNOSIS — Z741 Need for assistance with personal care: Secondary | ICD-10-CM | POA: Diagnosis not present

## 2020-12-19 DIAGNOSIS — R278 Other lack of coordination: Secondary | ICD-10-CM | POA: Diagnosis not present

## 2020-12-19 DIAGNOSIS — R4189 Other symptoms and signs involving cognitive functions and awareness: Secondary | ICD-10-CM | POA: Diagnosis not present

## 2020-12-19 DIAGNOSIS — I5032 Chronic diastolic (congestive) heart failure: Secondary | ICD-10-CM | POA: Diagnosis not present

## 2020-12-19 DIAGNOSIS — G2581 Restless legs syndrome: Secondary | ICD-10-CM | POA: Diagnosis not present

## 2020-12-19 DIAGNOSIS — E860 Dehydration: Secondary | ICD-10-CM | POA: Diagnosis not present

## 2020-12-19 DIAGNOSIS — I10 Essential (primary) hypertension: Secondary | ICD-10-CM | POA: Diagnosis not present

## 2020-12-21 DIAGNOSIS — I10 Essential (primary) hypertension: Secondary | ICD-10-CM | POA: Diagnosis not present

## 2020-12-21 DIAGNOSIS — Z8673 Personal history of transient ischemic attack (TIA), and cerebral infarction without residual deficits: Secondary | ICD-10-CM | POA: Diagnosis not present

## 2020-12-21 DIAGNOSIS — L603 Nail dystrophy: Secondary | ICD-10-CM | POA: Diagnosis not present

## 2020-12-21 DIAGNOSIS — I5032 Chronic diastolic (congestive) heart failure: Secondary | ICD-10-CM | POA: Diagnosis not present

## 2020-12-21 DIAGNOSIS — R278 Other lack of coordination: Secondary | ICD-10-CM | POA: Diagnosis not present

## 2020-12-21 DIAGNOSIS — R600 Localized edema: Secondary | ICD-10-CM | POA: Diagnosis not present

## 2020-12-21 DIAGNOSIS — I7091 Generalized atherosclerosis: Secondary | ICD-10-CM | POA: Diagnosis not present

## 2020-12-21 DIAGNOSIS — E860 Dehydration: Secondary | ICD-10-CM | POA: Diagnosis not present

## 2020-12-21 DIAGNOSIS — F39 Unspecified mood [affective] disorder: Secondary | ICD-10-CM | POA: Diagnosis not present

## 2020-12-21 DIAGNOSIS — G2581 Restless legs syndrome: Secondary | ICD-10-CM | POA: Diagnosis not present

## 2020-12-23 DIAGNOSIS — Z8673 Personal history of transient ischemic attack (TIA), and cerebral infarction without residual deficits: Secondary | ICD-10-CM | POA: Diagnosis not present

## 2020-12-23 DIAGNOSIS — I5032 Chronic diastolic (congestive) heart failure: Secondary | ICD-10-CM | POA: Diagnosis not present

## 2020-12-23 DIAGNOSIS — E860 Dehydration: Secondary | ICD-10-CM | POA: Diagnosis not present

## 2020-12-23 DIAGNOSIS — R278 Other lack of coordination: Secondary | ICD-10-CM | POA: Diagnosis not present

## 2020-12-23 DIAGNOSIS — I10 Essential (primary) hypertension: Secondary | ICD-10-CM | POA: Diagnosis not present

## 2020-12-23 DIAGNOSIS — G2581 Restless legs syndrome: Secondary | ICD-10-CM | POA: Diagnosis not present

## 2020-12-24 DIAGNOSIS — I10 Essential (primary) hypertension: Secondary | ICD-10-CM | POA: Diagnosis not present

## 2020-12-24 DIAGNOSIS — R278 Other lack of coordination: Secondary | ICD-10-CM | POA: Diagnosis not present

## 2020-12-24 DIAGNOSIS — G2581 Restless legs syndrome: Secondary | ICD-10-CM | POA: Diagnosis not present

## 2020-12-24 DIAGNOSIS — E860 Dehydration: Secondary | ICD-10-CM | POA: Diagnosis not present

## 2020-12-24 DIAGNOSIS — Z8673 Personal history of transient ischemic attack (TIA), and cerebral infarction without residual deficits: Secondary | ICD-10-CM | POA: Diagnosis not present

## 2020-12-24 DIAGNOSIS — I5032 Chronic diastolic (congestive) heart failure: Secondary | ICD-10-CM | POA: Diagnosis not present

## 2020-12-25 DIAGNOSIS — Z8673 Personal history of transient ischemic attack (TIA), and cerebral infarction without residual deficits: Secondary | ICD-10-CM | POA: Diagnosis not present

## 2020-12-25 DIAGNOSIS — R278 Other lack of coordination: Secondary | ICD-10-CM | POA: Diagnosis not present

## 2020-12-25 DIAGNOSIS — I10 Essential (primary) hypertension: Secondary | ICD-10-CM | POA: Diagnosis not present

## 2020-12-25 DIAGNOSIS — E860 Dehydration: Secondary | ICD-10-CM | POA: Diagnosis not present

## 2020-12-25 DIAGNOSIS — G2581 Restless legs syndrome: Secondary | ICD-10-CM | POA: Diagnosis not present

## 2020-12-25 DIAGNOSIS — I5032 Chronic diastolic (congestive) heart failure: Secondary | ICD-10-CM | POA: Diagnosis not present

## 2020-12-26 DIAGNOSIS — R278 Other lack of coordination: Secondary | ICD-10-CM | POA: Diagnosis not present

## 2020-12-26 DIAGNOSIS — Z8673 Personal history of transient ischemic attack (TIA), and cerebral infarction without residual deficits: Secondary | ICD-10-CM | POA: Diagnosis not present

## 2020-12-26 DIAGNOSIS — I10 Essential (primary) hypertension: Secondary | ICD-10-CM | POA: Diagnosis not present

## 2020-12-26 DIAGNOSIS — I5032 Chronic diastolic (congestive) heart failure: Secondary | ICD-10-CM | POA: Diagnosis not present

## 2020-12-26 DIAGNOSIS — E860 Dehydration: Secondary | ICD-10-CM | POA: Diagnosis not present

## 2020-12-26 DIAGNOSIS — G2581 Restless legs syndrome: Secondary | ICD-10-CM | POA: Diagnosis not present

## 2020-12-28 DIAGNOSIS — I5032 Chronic diastolic (congestive) heart failure: Secondary | ICD-10-CM | POA: Diagnosis not present

## 2020-12-28 DIAGNOSIS — G2581 Restless legs syndrome: Secondary | ICD-10-CM | POA: Diagnosis not present

## 2020-12-28 DIAGNOSIS — H34832 Tributary (branch) retinal vein occlusion, left eye, with macular edema: Secondary | ICD-10-CM | POA: Diagnosis not present

## 2020-12-28 DIAGNOSIS — Z8673 Personal history of transient ischemic attack (TIA), and cerebral infarction without residual deficits: Secondary | ICD-10-CM | POA: Diagnosis not present

## 2020-12-28 DIAGNOSIS — E860 Dehydration: Secondary | ICD-10-CM | POA: Diagnosis not present

## 2020-12-28 DIAGNOSIS — I10 Essential (primary) hypertension: Secondary | ICD-10-CM | POA: Diagnosis not present

## 2020-12-28 DIAGNOSIS — R278 Other lack of coordination: Secondary | ICD-10-CM | POA: Diagnosis not present

## 2020-12-29 DIAGNOSIS — M6281 Muscle weakness (generalized): Secondary | ICD-10-CM | POA: Diagnosis not present

## 2020-12-29 DIAGNOSIS — I472 Ventricular tachycardia, unspecified: Secondary | ICD-10-CM | POA: Diagnosis not present

## 2020-12-29 DIAGNOSIS — R4189 Other symptoms and signs involving cognitive functions and awareness: Secondary | ICD-10-CM | POA: Diagnosis not present

## 2020-12-29 DIAGNOSIS — R278 Other lack of coordination: Secondary | ICD-10-CM | POA: Diagnosis not present

## 2020-12-29 DIAGNOSIS — R2681 Unsteadiness on feet: Secondary | ICD-10-CM | POA: Diagnosis not present

## 2020-12-29 DIAGNOSIS — I4821 Permanent atrial fibrillation: Secondary | ICD-10-CM | POA: Diagnosis not present

## 2020-12-29 DIAGNOSIS — Z741 Need for assistance with personal care: Secondary | ICD-10-CM | POA: Diagnosis not present

## 2020-12-29 DIAGNOSIS — N1832 Chronic kidney disease, stage 3b: Secondary | ICD-10-CM | POA: Diagnosis not present

## 2020-12-29 DIAGNOSIS — F02811 Dementia in other diseases classified elsewhere, unspecified severity, with agitation: Secondary | ICD-10-CM | POA: Diagnosis not present

## 2020-12-29 DIAGNOSIS — S12110D Anterior displaced Type II dens fracture, subsequent encounter for fracture with routine healing: Secondary | ICD-10-CM | POA: Diagnosis not present

## 2020-12-29 DIAGNOSIS — H9190 Unspecified hearing loss, unspecified ear: Secondary | ICD-10-CM | POA: Diagnosis not present

## 2020-12-29 DIAGNOSIS — I5032 Chronic diastolic (congestive) heart failure: Secondary | ICD-10-CM | POA: Diagnosis not present

## 2020-12-29 DIAGNOSIS — I130 Hypertensive heart and chronic kidney disease with heart failure and stage 1 through stage 4 chronic kidney disease, or unspecified chronic kidney disease: Secondary | ICD-10-CM | POA: Diagnosis not present

## 2020-12-31 DIAGNOSIS — I4891 Unspecified atrial fibrillation: Secondary | ICD-10-CM | POA: Diagnosis not present

## 2020-12-31 DIAGNOSIS — R059 Cough, unspecified: Secondary | ICD-10-CM | POA: Diagnosis not present

## 2020-12-31 DIAGNOSIS — N400 Enlarged prostate without lower urinary tract symptoms: Secondary | ICD-10-CM | POA: Diagnosis not present

## 2020-12-31 DIAGNOSIS — Z741 Need for assistance with personal care: Secondary | ICD-10-CM | POA: Diagnosis not present

## 2020-12-31 DIAGNOSIS — F39 Unspecified mood [affective] disorder: Secondary | ICD-10-CM | POA: Diagnosis not present

## 2020-12-31 DIAGNOSIS — S12110D Anterior displaced Type II dens fracture, subsequent encounter for fracture with routine healing: Secondary | ICD-10-CM | POA: Diagnosis not present

## 2020-12-31 DIAGNOSIS — I5032 Chronic diastolic (congestive) heart failure: Secondary | ICD-10-CM | POA: Diagnosis not present

## 2020-12-31 DIAGNOSIS — N1832 Chronic kidney disease, stage 3b: Secondary | ICD-10-CM | POA: Diagnosis not present

## 2020-12-31 DIAGNOSIS — I130 Hypertensive heart and chronic kidney disease with heart failure and stage 1 through stage 4 chronic kidney disease, or unspecified chronic kidney disease: Secondary | ICD-10-CM | POA: Diagnosis not present

## 2020-12-31 DIAGNOSIS — I4821 Permanent atrial fibrillation: Secondary | ICD-10-CM | POA: Diagnosis not present

## 2021-01-01 DIAGNOSIS — I4821 Permanent atrial fibrillation: Secondary | ICD-10-CM | POA: Diagnosis not present

## 2021-01-01 DIAGNOSIS — Z741 Need for assistance with personal care: Secondary | ICD-10-CM | POA: Diagnosis not present

## 2021-01-01 DIAGNOSIS — N1832 Chronic kidney disease, stage 3b: Secondary | ICD-10-CM | POA: Diagnosis not present

## 2021-01-01 DIAGNOSIS — I130 Hypertensive heart and chronic kidney disease with heart failure and stage 1 through stage 4 chronic kidney disease, or unspecified chronic kidney disease: Secondary | ICD-10-CM | POA: Diagnosis not present

## 2021-01-01 DIAGNOSIS — S12110D Anterior displaced Type II dens fracture, subsequent encounter for fracture with routine healing: Secondary | ICD-10-CM | POA: Diagnosis not present

## 2021-01-01 DIAGNOSIS — I5032 Chronic diastolic (congestive) heart failure: Secondary | ICD-10-CM | POA: Diagnosis not present

## 2021-01-02 DIAGNOSIS — N1832 Chronic kidney disease, stage 3b: Secondary | ICD-10-CM | POA: Diagnosis not present

## 2021-01-02 DIAGNOSIS — I4821 Permanent atrial fibrillation: Secondary | ICD-10-CM | POA: Diagnosis not present

## 2021-01-02 DIAGNOSIS — I130 Hypertensive heart and chronic kidney disease with heart failure and stage 1 through stage 4 chronic kidney disease, or unspecified chronic kidney disease: Secondary | ICD-10-CM | POA: Diagnosis not present

## 2021-01-02 DIAGNOSIS — S12110D Anterior displaced Type II dens fracture, subsequent encounter for fracture with routine healing: Secondary | ICD-10-CM | POA: Diagnosis not present

## 2021-01-02 DIAGNOSIS — I5032 Chronic diastolic (congestive) heart failure: Secondary | ICD-10-CM | POA: Diagnosis not present

## 2021-01-02 DIAGNOSIS — Z741 Need for assistance with personal care: Secondary | ICD-10-CM | POA: Diagnosis not present

## 2021-01-03 ENCOUNTER — Emergency Department: Payer: Medicare Other

## 2021-01-03 ENCOUNTER — Other Ambulatory Visit: Payer: Self-pay

## 2021-01-03 ENCOUNTER — Encounter: Payer: Self-pay | Admitting: Emergency Medicine

## 2021-01-03 ENCOUNTER — Emergency Department
Admission: EM | Admit: 2021-01-03 | Discharge: 2021-01-03 | Disposition: A | Discharge status: Skilled Nursing Facility | Payer: Medicare Other | Attending: Student in an Organized Health Care Education/Training Program | Admitting: Student in an Organized Health Care Education/Training Program

## 2021-01-03 DIAGNOSIS — S40012A Contusion of left shoulder, initial encounter: Secondary | ICD-10-CM | POA: Diagnosis not present

## 2021-01-03 DIAGNOSIS — Z96653 Presence of artificial knee joint, bilateral: Secondary | ICD-10-CM | POA: Insufficient documentation

## 2021-01-03 DIAGNOSIS — N184 Chronic kidney disease, stage 4 (severe): Secondary | ICD-10-CM | POA: Diagnosis not present

## 2021-01-03 DIAGNOSIS — Z043 Encounter for examination and observation following other accident: Secondary | ICD-10-CM | POA: Diagnosis not present

## 2021-01-03 DIAGNOSIS — S12112A Nondisplaced Type II dens fracture, initial encounter for closed fracture: Secondary | ICD-10-CM | POA: Diagnosis not present

## 2021-01-03 DIAGNOSIS — I5032 Chronic diastolic (congestive) heart failure: Secondary | ICD-10-CM | POA: Insufficient documentation

## 2021-01-03 DIAGNOSIS — R52 Pain, unspecified: Secondary | ICD-10-CM | POA: Diagnosis not present

## 2021-01-03 DIAGNOSIS — S4992XA Unspecified injury of left shoulder and upper arm, initial encounter: Secondary | ICD-10-CM | POA: Diagnosis present

## 2021-01-03 DIAGNOSIS — I130 Hypertensive heart and chronic kidney disease with heart failure and stage 1 through stage 4 chronic kidney disease, or unspecified chronic kidney disease: Secondary | ICD-10-CM | POA: Diagnosis not present

## 2021-01-03 DIAGNOSIS — W010XXA Fall on same level from slipping, tripping and stumbling without subsequent striking against object, initial encounter: Secondary | ICD-10-CM | POA: Diagnosis not present

## 2021-01-03 DIAGNOSIS — Z79899 Other long term (current) drug therapy: Secondary | ICD-10-CM | POA: Diagnosis not present

## 2021-01-03 DIAGNOSIS — Z87891 Personal history of nicotine dependence: Secondary | ICD-10-CM | POA: Diagnosis not present

## 2021-01-03 DIAGNOSIS — Z7901 Long term (current) use of anticoagulants: Secondary | ICD-10-CM | POA: Insufficient documentation

## 2021-01-03 DIAGNOSIS — R519 Headache, unspecified: Secondary | ICD-10-CM | POA: Insufficient documentation

## 2021-01-03 DIAGNOSIS — M25519 Pain in unspecified shoulder: Secondary | ICD-10-CM | POA: Diagnosis not present

## 2021-01-03 DIAGNOSIS — M25512 Pain in left shoulder: Secondary | ICD-10-CM | POA: Diagnosis not present

## 2021-01-03 MED ORDER — ACETAMINOPHEN 325 MG PO TABS
650.0000 mg | ORAL_TABLET | Freq: Once | ORAL | Status: AC
  Administered 2021-01-03: 650 mg via ORAL
  Filled 2021-01-03: qty 2

## 2021-01-03 NOTE — ED Triage Notes (Signed)
Pt to ED via [facility] from [place] for fall with left shoulder pain. Pt fell last night refused ems transport. Pt c/o pain and swelling to the left arm. Pt is in NAD.

## 2021-01-03 NOTE — ED Provider Notes (Signed)
[facility] Emergency Department Provider Note    Event Date/Time   First MD Initiated Contact with Patient 01/03/21 1619     (approximate)  I have reviewed the triage vital signs and the nursing notes.   HISTORY  Chief Complaint Fall and Shoulder Pain (/)    HPI Stephen Garrett is a 87 y.o. male extensive past medical history as listed below presents to the ER after mechanical fall with complaint of left shoulder pain.  Lives at [facility].  Typically gets around with a rolling walker.  States last night he got out of bed using bathroom did not realize that his pants are still on and fell down around his legs causing him to trip and fall.  Denies any neck pain.  No headache.  Only complaining of left shoulder pain.  No numbness or tingling.  Past Medical History:  Diagnosis Date   Actinic keratosis    BPH (benign prostatic hypertrophy)    CKD (chronic kidney disease), stage IV (HCC)    Diseases of lips    Diverticulosis of colon 06/2003   ED (erectile dysfunction)    Frequent falls    GERD (gastroesophageal reflux disease)    Heart murmur    as child   HLD (hyperlipidemia)    HOH (hard of hearing)    Cochlear implant right, hearing aid left   HTN (hypertension)    LVH (left ventricular hypertrophy)    a. 01/2020 Echo: EF 60-65%, no rwma, sev LVH. Nl RV size/fxn. Sev BAE. Mild MR.   Lymphedema    Numbness and tingling in both hands    intermittent, trying braces    Osteoarthritis of knee    severe, bilat   Permanent atrial fibrillation (HCC)    Restless legs syndrome (RLS)    Sleep disturbance, unspecified    Spinal stenosis, lumbar region, without neurogenic claudication    Family History  Problem Relation Age of Onset   Other Father        "clogged arteries"   Hypertension Mother    Past Surgical History:  Procedure Laterality Date   CATARACT EXTRACTION, BILATERAL  2011   Dr. Thomasene Ripple   COCHLEAR IMPLANT Right 02/15/2017    Procedure: RIGHT COCHLEAR IMPLANT;  Surgeon: Vicie Mutters, MD;  Location: [place];  Service: ENT;  Laterality: Right;   FINGER SURGERY     Right 4th finger   LIH  10/2009   Dr. Bary Castilla   LUMBAR SPINE SURGERY  06/2005   stenosis repair   PTOSIS REPAIR Bilateral 04/19/2019   Procedure: BLEPHAROPTOSIS REPAIR; RESECT SKIN;  Surgeon: Karle Starch, MD;  Location: [place];  Service: Ophthalmology;  Laterality: Bilateral;   TONSILLECTOMY  childhood   TOTAL KNEE ARTHROPLASTY  06/2008   bilat (Dr. Marry Guan)   Patient Active Problem List   Diagnosis Date Noted   MDD (major depressive disorder), recurrent episode, moderate ([place]) 12/18/2020   Elevated troponin 12/03/2020   HTN (hypertension)    HLD (hyperlipidemia)    Fall    Odontoid fracture [facility])    Ventricular tachycardia    Depression with anxiety    Insomnia 11/26/2020   Right leg pain 09/27/2020   Bilateral lower extremity edema 09/27/2020   Anemia 09/27/2020   Vascular dementia without behavioral disturbance ([place]) 08/13/2020   Stasis ulcer ([place]) 08/13/2020   Chronic diastolic heart failure ([place]) 01/14/2020   Hypokalemia 01/01/2020   A-fib ([place]) 01/01/2020   Diplopia 09/25/2017   TIA (  transient ischemic attack) 09/14/2017   Generalized weakness 08/04/2017   Retinal vein occlusion, central 02/19/2014   Chronic venous insufficiency 02/19/2014   Episodic mood disorder ([place]) 12/20/2013   Advanced directives, counseling/discussion 12/20/2013   Chronic kidney disease, stage IV (severe) ([place]) 01/26/2009   Restless legs syndrome 10/03/2008   Osteoarthritis 12/12/2006   SPINAL STENOSIS, LUMBAR 06/24/2006   Essential hypertension, benign 06/21/2006   GERD 06/21/2006   BPH with obstruction/lower urinary tract symptoms 06/21/2006      Prior to Admission medications   Medication Sig Start Date End Date Taking? Authorizing Provider  acetaminophen (TYLENOL) 325 MG tablet Take 2 tablets (650 mg total) by mouth  every 6 (six) hours as needed for mild pain (or Fever >/= 101). 09/28/20   Barb Merino, MD  apixaban (ELIQUIS) 2.5 MG TABS tablet Take 1 tablet (2.5 mg total) by mouth 2 (two) times daily. 01/05/20   Dessa Phi, DO  buPROPion (WELLBUTRIN XL) 150 MG 24 hr tablet Take 1 tablet (150 mg total) by mouth daily. 12/18/20   Clapacs, Madie Reno, MD  clonazePAM (KLONOPIN) 0.5 MG tablet TAKE ONE TO TWO TABLETS AT BEDTIME Patient not taking: Reported on 12/17/2020 03/02/20   [name], MD  finasteride (PROSCAR) 5 MG tablet TAKE ONE TABLET BY MOUTH EVERY DAY Patient not taking: Reported on 12/17/2020 08/05/19   [name], MD  furosemide (LASIX) 80 MG tablet Take 80 mg by mouth daily. Patient not taking: Reported on 12/17/2020    [provider]  hydrALAZINE (APRESOLINE) 25 MG tablet Take 25 mg by mouth 4 (four) times daily.    [provider]  Multiple Vitamin (MULTIVITAMIN) capsule Take 1 capsule by mouth daily.    [provider]  omeprazole (PRILOSEC) 40 MG capsule TAKE 1 CAPSULE BY MOUTH ONCE DAILY Patient not taking: Reported on 12/17/2020 03/30/20   Viviana Simpler I, MD  PANTOPRAZOLE SODIUM PO Take 1 tablet by mouth daily.    [provider]  potassium chloride SA (KLOR-CON) 20 MEQ tablet Take 1 tablet (20 mEq total) by mouth 2 (two) times daily. 12/18/20   Harvest Dark, MD  pramipexole (MIRAPEX) 1 MG tablet TAKE ONE TABLET AT [facility] AND ONE TABLETAT BEDTIME Patient not taking: Reported on 12/17/2020 09/16/19   [name], MD  pyridoxine (B-6) 100 MG tablet Take 100 mg by mouth daily.    [provider]  tamsulosin (FLOMAX) 0.4 MG CAPS capsule TAKE 1 CAPSULE EVERY DAY Patient taking differently: Take 0.4 mg by mouth in the morning and at bedtime. 08/05/19   [name], MD  traZODone (DESYREL) 50 MG tablet Take 0.5 tablets (25 mg total) by mouth at bedtime as needed for sleep. 11/26/20   Jearld Fenton, NP  citalopram (CELEXA) 10  MG tablet Take 1 tablet (10 mg total) by mouth daily. 04/22/11 05/18/11  [name], MD    Allergies Atorvastatin, Pravastatin sodium, and Statins    Social History Social History   Tobacco Use   Smoking status: Former    Packs/day: 0.75    Years: 30.00    Pack years: 22.50    Types: Cigarettes    Quit date: 02/28/1970    Years since quitting: 50.8   Smokeless tobacco: Never  Vaping Use   Vaping Use: Never used  Substance Use Topics   Alcohol use: Yes    Alcohol/week: 0.0 standard drinks    Comment: 1-2 glasses wine/daily   Drug use: No    Review  of Systems Patient denies headaches, rhinorrhea, blurry vision, numbness, shortness of breath, chest pain, edema, cough, abdominal pain, nausea, vomiting, diarrhea, dysuria, fevers, rashes or hallucinations unless otherwise stated above in HPI. ____________________________________________   PHYSICAL EXAM:  VITAL SIGNS: Vitals:   01/03/21 1130  BP: 137/80  Pulse: 75  Resp: 16  Temp: 98.2 F (36.8 C)  SpO2: 100%    Constitutional: Alert and oriented.  Eyes: Conjunctivae are normal.  Head: Atraumatic. Nose: No congestion/rhinnorhea. Mouth/Throat: Mucous membranes are moist.   Neck: No stridor. Painless ROM.  Cardiovascular: Normal rate, regular rhythm. Grossly normal heart sounds.  Good peripheral circulation. Respiratory: Normal respiratory effort.  No retractions. Lungs CTAB. Gastrointestinal: Soft and nontender. No distention. No abdominal bruits. No CVA tenderness. Genitourinary:  Musculoskeletal: There is some tenderness on the posterior aspect of the left shoulder no overlying erythema there is some contusion as well as swelling at the left elbow.  Neurovascular intact distally.  No lower extremity tenderness. No joint effusions. Neurologic:  Normal speech and language. No gross focal neurologic deficits are appreciated. No facial droop Skin:  Skin is warm, dry and intact. No rash noted. Psychiatric: Mood and  affect are normal. Speech and behavior are normal.  ____________________________________________   LABS (all labs ordered are listed, but only abnormal results are displayed)  No results found for this or any previous visit (from the past 24 hour(s)). ____________________________________________ ____________________________________________  [id number]  I personally reviewed all radiographic images ordered to evaluate for the above acute complaints and reviewed radiology reports and findings.  These findings were personally discussed with the patient.  Please see medical record for radiology report.  ____________________________________________   PROCEDURES  Procedure(s) performed:  Procedures    Critical Care performed: no ____________________________________________   INITIAL IMPRESSION / ASSESSMENT AND PLAN / ED COURSE  Pertinent labs & imaging results that were available during my care of the patient were reviewed by me and considered in my medical decision making (see chart for details).   DDX: fracture, contusion, dislocation, sdh, iph,   Stephen Garrett is a 54 y.o. who presents to the ED with injury as described above.  Patient was placed in cervical collar after CT imaging showed evidence of a type II dens fracture however patient suffered this injury over a month ago and is not a surgical candidate he is denying any pain or discomfort.  He is not wearing cervical collar.  This does not appear to be acute acute.  He is not having any pain in the neck.  Is only complaining of left shoulder pain.  X-rays without any evidence of fracture no dislocation.  Will be placed in sling as he does have some weakness and pain with abduction of the left shoulder.  Suspect rotator cuff injury.  Recommended bladder based on patient's age patient family declined any further work-up he is a DNR.  He is coming from [facility] I think it is reasonable for him to be discharged back to that  facility.  She is now venovenous sling and uses a rolling walker will order wheelchair as she would not be able to safely ambulate.  Will give referral to Ortho.  Patient and family agreeable to plan.     The patient was evaluated in Emergency Department today for the symptoms described in the history of present illness. He/she was evaluated in the context of the global COVID-19 pandemic, which necessitated consideration that the patient might be at risk for infection with the SARS-CoV-2 virus  that causes COVID-19. Institutional protocols and algorithms that pertain to the evaluation of patients at risk for COVID-19 are in a state of rapid change based on information released by regulatory bodies including the CDC and federal and state organizations. These policies and algorithms were followed during the patient's care in the ED.  As part of my medical decision making, I reviewed the following data within the [place] notes reviewed and incorporated, Labs reviewed, notes from prior ED visits and [place] Controlled Substance Database   ____________________________________________   FINAL CLINICAL IMPRESSION(S) / ED DIAGNOSES  Final diagnoses:  Pain  Acute pain of left shoulder      NEW MEDICATIONS STARTED DURING THIS VISIT:  New Prescriptions   No medications on file     Note:  This document was prepared using Dragon voice recognition software and may include unintentional dictation errors.    Merlyn Lot, MD 01/03/21 [phone]

## 2021-01-03 NOTE — Care Management (Signed)
    Durable Medical Equipment  (From admission, onward)           Start     Ordered   01/03/21 1638  For home use only DME standard manual wheelchair with seat cushion  Once       Comments: Patient suffers from difficulty walking and acute left shoulder injury requiring sling which impairs their ability to perform daily activities like bathing, dressing, ambulation. in the home.  A walker will not resolve issue with performing activities of daily living. A wheelchair will allow patient to safely perform daily activities. Patient can safely propel the wheelchair in the home or has a caregiver who can provide assistance. Length of need 6 months . Accessories: elevating leg rests (ELRs), wheel locks, extensions and anti-tippers.   01/03/21 1638

## 2021-01-03 NOTE — ED Notes (Signed)
Called report to [place] [phone] and spoke with [facility]. Discussed f/u with ortho, left shoulder care, and neuro precautions due to head CT result.

## 2021-01-03 NOTE — Care Management (Signed)
[facility] ED RNCM received TOC consult for w/c in the ED at [facility].  RNCM contacted EDP regarding not being able to obtain w/c at this time.  Informed that patient is at [facility] and he is returning there this evening.. Discussed ordering the w/c and having it delivered there tomorrow.  Referral placed with [facility] w/c will be delivered to the facility.

## 2021-01-03 NOTE — ED Triage Notes (Signed)
Pt comes ems from [facility] with unwitnessed fall. C/o left shoulder pain. Refused [facility] transport last night for fall. Aox4, HOH, VSS.

## 2021-01-04 DIAGNOSIS — M25512 Pain in left shoulder: Secondary | ICD-10-CM | POA: Diagnosis not present

## 2021-01-04 DIAGNOSIS — I9589 Other hypotension: Secondary | ICD-10-CM | POA: Diagnosis not present

## 2021-01-06 DIAGNOSIS — I130 Hypertensive heart and chronic kidney disease with heart failure and stage 1 through stage 4 chronic kidney disease, or unspecified chronic kidney disease: Secondary | ICD-10-CM | POA: Diagnosis not present

## 2021-01-06 DIAGNOSIS — I5032 Chronic diastolic (congestive) heart failure: Secondary | ICD-10-CM | POA: Diagnosis not present

## 2021-01-06 DIAGNOSIS — N1832 Chronic kidney disease, stage 3b: Secondary | ICD-10-CM | POA: Diagnosis not present

## 2021-01-06 DIAGNOSIS — I4821 Permanent atrial fibrillation: Secondary | ICD-10-CM | POA: Diagnosis not present

## 2021-01-06 DIAGNOSIS — S12110D Anterior displaced Type II dens fracture, subsequent encounter for fracture with routine healing: Secondary | ICD-10-CM | POA: Diagnosis not present

## 2021-01-06 DIAGNOSIS — Z741 Need for assistance with personal care: Secondary | ICD-10-CM | POA: Diagnosis not present

## 2021-01-07 DIAGNOSIS — Z741 Need for assistance with personal care: Secondary | ICD-10-CM | POA: Diagnosis not present

## 2021-01-07 DIAGNOSIS — I130 Hypertensive heart and chronic kidney disease with heart failure and stage 1 through stage 4 chronic kidney disease, or unspecified chronic kidney disease: Secondary | ICD-10-CM | POA: Diagnosis not present

## 2021-01-07 DIAGNOSIS — N1832 Chronic kidney disease, stage 3b: Secondary | ICD-10-CM | POA: Diagnosis not present

## 2021-01-07 DIAGNOSIS — S12110D Anterior displaced Type II dens fracture, subsequent encounter for fracture with routine healing: Secondary | ICD-10-CM | POA: Diagnosis not present

## 2021-01-07 DIAGNOSIS — I5032 Chronic diastolic (congestive) heart failure: Secondary | ICD-10-CM | POA: Diagnosis not present

## 2021-01-07 DIAGNOSIS — I4821 Permanent atrial fibrillation: Secondary | ICD-10-CM | POA: Diagnosis not present

## 2021-01-08 DIAGNOSIS — I4821 Permanent atrial fibrillation: Secondary | ICD-10-CM | POA: Diagnosis not present

## 2021-01-08 DIAGNOSIS — I130 Hypertensive heart and chronic kidney disease with heart failure and stage 1 through stage 4 chronic kidney disease, or unspecified chronic kidney disease: Secondary | ICD-10-CM | POA: Diagnosis not present

## 2021-01-08 DIAGNOSIS — S12110D Anterior displaced Type II dens fracture, subsequent encounter for fracture with routine healing: Secondary | ICD-10-CM | POA: Diagnosis not present

## 2021-01-08 DIAGNOSIS — Z741 Need for assistance with personal care: Secondary | ICD-10-CM | POA: Diagnosis not present

## 2021-01-08 DIAGNOSIS — S46002A Unspecified injury of muscle(s) and tendon(s) of the rotator cuff of left shoulder, initial encounter: Secondary | ICD-10-CM | POA: Diagnosis not present

## 2021-01-08 DIAGNOSIS — I5032 Chronic diastolic (congestive) heart failure: Secondary | ICD-10-CM | POA: Diagnosis not present

## 2021-01-08 DIAGNOSIS — N1832 Chronic kidney disease, stage 3b: Secondary | ICD-10-CM | POA: Diagnosis not present

## 2021-01-10 DIAGNOSIS — I4821 Permanent atrial fibrillation: Secondary | ICD-10-CM | POA: Diagnosis not present

## 2021-01-10 DIAGNOSIS — M79642 Pain in left hand: Secondary | ICD-10-CM | POA: Diagnosis not present

## 2021-01-10 DIAGNOSIS — M79622 Pain in left upper arm: Secondary | ICD-10-CM | POA: Diagnosis not present

## 2021-01-10 DIAGNOSIS — I5032 Chronic diastolic (congestive) heart failure: Secondary | ICD-10-CM | POA: Diagnosis not present

## 2021-01-10 DIAGNOSIS — S12110D Anterior displaced Type II dens fracture, subsequent encounter for fracture with routine healing: Secondary | ICD-10-CM | POA: Diagnosis not present

## 2021-01-10 DIAGNOSIS — I130 Hypertensive heart and chronic kidney disease with heart failure and stage 1 through stage 4 chronic kidney disease, or unspecified chronic kidney disease: Secondary | ICD-10-CM | POA: Diagnosis not present

## 2021-01-10 DIAGNOSIS — M25532 Pain in left wrist: Secondary | ICD-10-CM | POA: Diagnosis not present

## 2021-01-10 DIAGNOSIS — M25512 Pain in left shoulder: Secondary | ICD-10-CM | POA: Diagnosis not present

## 2021-01-10 DIAGNOSIS — N1832 Chronic kidney disease, stage 3b: Secondary | ICD-10-CM | POA: Diagnosis not present

## 2021-01-10 DIAGNOSIS — Z741 Need for assistance with personal care: Secondary | ICD-10-CM | POA: Diagnosis not present

## 2021-01-12 DIAGNOSIS — I4821 Permanent atrial fibrillation: Secondary | ICD-10-CM | POA: Diagnosis not present

## 2021-01-12 DIAGNOSIS — I130 Hypertensive heart and chronic kidney disease with heart failure and stage 1 through stage 4 chronic kidney disease, or unspecified chronic kidney disease: Secondary | ICD-10-CM | POA: Diagnosis not present

## 2021-01-12 DIAGNOSIS — H903 Sensorineural hearing loss, bilateral: Secondary | ICD-10-CM | POA: Diagnosis not present

## 2021-01-12 DIAGNOSIS — Z741 Need for assistance with personal care: Secondary | ICD-10-CM | POA: Diagnosis not present

## 2021-01-12 DIAGNOSIS — S12110D Anterior displaced Type II dens fracture, subsequent encounter for fracture with routine healing: Secondary | ICD-10-CM | POA: Diagnosis not present

## 2021-01-12 DIAGNOSIS — I5032 Chronic diastolic (congestive) heart failure: Secondary | ICD-10-CM | POA: Diagnosis not present

## 2021-01-12 DIAGNOSIS — N1832 Chronic kidney disease, stage 3b: Secondary | ICD-10-CM | POA: Diagnosis not present

## 2021-01-13 DIAGNOSIS — I130 Hypertensive heart and chronic kidney disease with heart failure and stage 1 through stage 4 chronic kidney disease, or unspecified chronic kidney disease: Secondary | ICD-10-CM | POA: Diagnosis not present

## 2021-01-13 DIAGNOSIS — N1832 Chronic kidney disease, stage 3b: Secondary | ICD-10-CM | POA: Diagnosis not present

## 2021-01-13 DIAGNOSIS — Z741 Need for assistance with personal care: Secondary | ICD-10-CM | POA: Diagnosis not present

## 2021-01-13 DIAGNOSIS — S12110D Anterior displaced Type II dens fracture, subsequent encounter for fracture with routine healing: Secondary | ICD-10-CM | POA: Diagnosis not present

## 2021-01-13 DIAGNOSIS — I4821 Permanent atrial fibrillation: Secondary | ICD-10-CM | POA: Diagnosis not present

## 2021-01-13 DIAGNOSIS — I5032 Chronic diastolic (congestive) heart failure: Secondary | ICD-10-CM | POA: Diagnosis not present

## 2021-01-14 DIAGNOSIS — Z741 Need for assistance with personal care: Secondary | ICD-10-CM | POA: Diagnosis not present

## 2021-01-14 DIAGNOSIS — I130 Hypertensive heart and chronic kidney disease with heart failure and stage 1 through stage 4 chronic kidney disease, or unspecified chronic kidney disease: Secondary | ICD-10-CM | POA: Diagnosis not present

## 2021-01-14 DIAGNOSIS — S12110D Anterior displaced Type II dens fracture, subsequent encounter for fracture with routine healing: Secondary | ICD-10-CM | POA: Diagnosis not present

## 2021-01-14 DIAGNOSIS — I5032 Chronic diastolic (congestive) heart failure: Secondary | ICD-10-CM | POA: Diagnosis not present

## 2021-01-14 DIAGNOSIS — I4821 Permanent atrial fibrillation: Secondary | ICD-10-CM | POA: Diagnosis not present

## 2021-01-14 DIAGNOSIS — N1832 Chronic kidney disease, stage 3b: Secondary | ICD-10-CM | POA: Diagnosis not present

## 2021-01-15 DIAGNOSIS — N1832 Chronic kidney disease, stage 3b: Secondary | ICD-10-CM | POA: Diagnosis not present

## 2021-01-15 DIAGNOSIS — Z741 Need for assistance with personal care: Secondary | ICD-10-CM | POA: Diagnosis not present

## 2021-01-15 DIAGNOSIS — S12110D Anterior displaced Type II dens fracture, subsequent encounter for fracture with routine healing: Secondary | ICD-10-CM | POA: Diagnosis not present

## 2021-01-15 DIAGNOSIS — I4821 Permanent atrial fibrillation: Secondary | ICD-10-CM | POA: Diagnosis not present

## 2021-01-15 DIAGNOSIS — I5032 Chronic diastolic (congestive) heart failure: Secondary | ICD-10-CM | POA: Diagnosis not present

## 2021-01-15 DIAGNOSIS — I130 Hypertensive heart and chronic kidney disease with heart failure and stage 1 through stage 4 chronic kidney disease, or unspecified chronic kidney disease: Secondary | ICD-10-CM | POA: Diagnosis not present

## 2021-01-17 DIAGNOSIS — I130 Hypertensive heart and chronic kidney disease with heart failure and stage 1 through stage 4 chronic kidney disease, or unspecified chronic kidney disease: Secondary | ICD-10-CM | POA: Diagnosis not present

## 2021-01-17 DIAGNOSIS — N1832 Chronic kidney disease, stage 3b: Secondary | ICD-10-CM | POA: Diagnosis not present

## 2021-01-17 DIAGNOSIS — I5032 Chronic diastolic (congestive) heart failure: Secondary | ICD-10-CM | POA: Diagnosis not present

## 2021-01-17 DIAGNOSIS — S12110D Anterior displaced Type II dens fracture, subsequent encounter for fracture with routine healing: Secondary | ICD-10-CM | POA: Diagnosis not present

## 2021-01-17 DIAGNOSIS — I4821 Permanent atrial fibrillation: Secondary | ICD-10-CM | POA: Diagnosis not present

## 2021-01-17 DIAGNOSIS — Z741 Need for assistance with personal care: Secondary | ICD-10-CM | POA: Diagnosis not present

## 2021-01-18 DIAGNOSIS — S12110D Anterior displaced Type II dens fracture, subsequent encounter for fracture with routine healing: Secondary | ICD-10-CM | POA: Diagnosis not present

## 2021-01-18 DIAGNOSIS — I130 Hypertensive heart and chronic kidney disease with heart failure and stage 1 through stage 4 chronic kidney disease, or unspecified chronic kidney disease: Secondary | ICD-10-CM | POA: Diagnosis not present

## 2021-01-18 DIAGNOSIS — N1832 Chronic kidney disease, stage 3b: Secondary | ICD-10-CM | POA: Diagnosis not present

## 2021-01-18 DIAGNOSIS — I4821 Permanent atrial fibrillation: Secondary | ICD-10-CM | POA: Diagnosis not present

## 2021-01-18 DIAGNOSIS — Z741 Need for assistance with personal care: Secondary | ICD-10-CM | POA: Diagnosis not present

## 2021-01-18 DIAGNOSIS — I5032 Chronic diastolic (congestive) heart failure: Secondary | ICD-10-CM | POA: Diagnosis not present

## 2021-01-19 DIAGNOSIS — Z741 Need for assistance with personal care: Secondary | ICD-10-CM | POA: Diagnosis not present

## 2021-01-19 DIAGNOSIS — S12110D Anterior displaced Type II dens fracture, subsequent encounter for fracture with routine healing: Secondary | ICD-10-CM | POA: Diagnosis not present

## 2021-01-19 DIAGNOSIS — I130 Hypertensive heart and chronic kidney disease with heart failure and stage 1 through stage 4 chronic kidney disease, or unspecified chronic kidney disease: Secondary | ICD-10-CM | POA: Diagnosis not present

## 2021-01-19 DIAGNOSIS — I4821 Permanent atrial fibrillation: Secondary | ICD-10-CM | POA: Diagnosis not present

## 2021-01-19 DIAGNOSIS — N1832 Chronic kidney disease, stage 3b: Secondary | ICD-10-CM | POA: Diagnosis not present

## 2021-01-19 DIAGNOSIS — I5032 Chronic diastolic (congestive) heart failure: Secondary | ICD-10-CM | POA: Diagnosis not present

## 2021-01-20 DIAGNOSIS — I5032 Chronic diastolic (congestive) heart failure: Secondary | ICD-10-CM | POA: Diagnosis not present

## 2021-01-20 DIAGNOSIS — I4821 Permanent atrial fibrillation: Secondary | ICD-10-CM | POA: Diagnosis not present

## 2021-01-20 DIAGNOSIS — S12110D Anterior displaced Type II dens fracture, subsequent encounter for fracture with routine healing: Secondary | ICD-10-CM | POA: Diagnosis not present

## 2021-01-20 DIAGNOSIS — Z741 Need for assistance with personal care: Secondary | ICD-10-CM | POA: Diagnosis not present

## 2021-01-20 DIAGNOSIS — I130 Hypertensive heart and chronic kidney disease with heart failure and stage 1 through stage 4 chronic kidney disease, or unspecified chronic kidney disease: Secondary | ICD-10-CM | POA: Diagnosis not present

## 2021-01-20 DIAGNOSIS — N1832 Chronic kidney disease, stage 3b: Secondary | ICD-10-CM | POA: Diagnosis not present

## 2021-01-28 DEATH — deceased
